# Patient Record
Sex: Female | Born: 1940 | Race: White | Hispanic: No | Marital: Married | Smoking: Former smoker
Health system: Southern US, Community
[De-identification: ages and names within clinical notes are randomized; demographics above are authoritative.]

## PROBLEM LIST (undated history)

## (undated) DIAGNOSIS — J449 Chronic obstructive pulmonary disease, unspecified: Secondary | ICD-10-CM

## (undated) DIAGNOSIS — Z7901 Long term (current) use of anticoagulants: Secondary | ICD-10-CM

## (undated) DIAGNOSIS — M1990 Unspecified osteoarthritis, unspecified site: Secondary | ICD-10-CM

## (undated) DIAGNOSIS — Z87442 Personal history of urinary calculi: Secondary | ICD-10-CM

## (undated) DIAGNOSIS — G4700 Insomnia, unspecified: Secondary | ICD-10-CM

## (undated) DIAGNOSIS — R0600 Dyspnea, unspecified: Secondary | ICD-10-CM

## (undated) DIAGNOSIS — R05 Cough: Secondary | ICD-10-CM

## (undated) DIAGNOSIS — I700 Atherosclerosis of aorta: Secondary | ICD-10-CM

## (undated) DIAGNOSIS — R053 Chronic cough: Secondary | ICD-10-CM

## (undated) DIAGNOSIS — J45909 Unspecified asthma, uncomplicated: Secondary | ICD-10-CM

## (undated) DIAGNOSIS — D649 Anemia, unspecified: Secondary | ICD-10-CM

## (undated) DIAGNOSIS — F32A Depression, unspecified: Secondary | ICD-10-CM

## (undated) DIAGNOSIS — I5189 Other ill-defined heart diseases: Secondary | ICD-10-CM

## (undated) DIAGNOSIS — T8859XA Other complications of anesthesia, initial encounter: Secondary | ICD-10-CM

## (undated) DIAGNOSIS — G4733 Obstructive sleep apnea (adult) (pediatric): Secondary | ICD-10-CM

## (undated) DIAGNOSIS — E119 Type 2 diabetes mellitus without complications: Secondary | ICD-10-CM

## (undated) DIAGNOSIS — I350 Nonrheumatic aortic (valve) stenosis: Secondary | ICD-10-CM

## (undated) DIAGNOSIS — I10 Essential (primary) hypertension: Secondary | ICD-10-CM

## (undated) DIAGNOSIS — Z8719 Personal history of other diseases of the digestive system: Secondary | ICD-10-CM

## (undated) DIAGNOSIS — M48061 Spinal stenosis, lumbar region without neurogenic claudication: Secondary | ICD-10-CM

## (undated) DIAGNOSIS — M810 Age-related osteoporosis without current pathological fracture: Secondary | ICD-10-CM

## (undated) DIAGNOSIS — G2581 Restless legs syndrome: Secondary | ICD-10-CM

## (undated) DIAGNOSIS — R011 Cardiac murmur, unspecified: Secondary | ICD-10-CM

## (undated) DIAGNOSIS — E7800 Pure hypercholesterolemia, unspecified: Secondary | ICD-10-CM

## (undated) DIAGNOSIS — N183 Chronic kidney disease, stage 3 unspecified: Secondary | ICD-10-CM

## (undated) DIAGNOSIS — G4730 Sleep apnea, unspecified: Secondary | ICD-10-CM

## (undated) DIAGNOSIS — I4891 Unspecified atrial fibrillation: Secondary | ICD-10-CM

## (undated) DIAGNOSIS — A419 Sepsis, unspecified organism: Secondary | ICD-10-CM

## (undated) DIAGNOSIS — K5792 Diverticulitis of intestine, part unspecified, without perforation or abscess without bleeding: Secondary | ICD-10-CM

## (undated) HISTORY — PX: CATARACT EXTRACTION W/ INTRAOCULAR LENS  IMPLANT, BILATERAL: SHX1307

## (undated) HISTORY — PX: BREAST SURGERY: SHX581

## (undated) HISTORY — PX: TOTAL SHOULDER REPLACEMENT: SUR1217

## (undated) HISTORY — PX: ABDOMINAL HYSTERECTOMY: SHX81

## (undated) HISTORY — PX: COLONOSCOPY: SHX174

## (undated) HISTORY — DX: Cardiac murmur, unspecified: R01.1

## (undated) HISTORY — DX: Unspecified atrial fibrillation: I48.91

## (undated) HISTORY — PX: WRIST SURGERY: SHX841

## (undated) HISTORY — PX: ELBOW SURGERY: SHX618

## (undated) HISTORY — DX: Diverticulitis of intestine, part unspecified, without perforation or abscess without bleeding: K57.92

## (undated) HISTORY — PX: CHOLECYSTECTOMY: SHX55

## (undated) HISTORY — PX: FRACTURE SURGERY: SHX138

## (undated) HISTORY — PX: KNEE SURGERY: SHX244

## (undated) HISTORY — PX: BREAST CYST EXCISION: SHX579

## (undated) HISTORY — PX: EYE SURGERY: SHX253

## (undated) HISTORY — PX: JOINT REPLACEMENT: SHX530

## (undated) HISTORY — PX: BREAST BIOPSY: SHX20

---

## 1976-05-25 HISTORY — PX: ABDOMINAL HYSTERECTOMY: SHX81

## 1976-05-25 HISTORY — PX: APPENDECTOMY: SHX54

## 1995-12-01 ENCOUNTER — Encounter: Payer: Self-pay | Admitting: Family Medicine

## 2012-05-25 HISTORY — PX: TOTAL SHOULDER REPLACEMENT: SUR1217

## 2014-04-05 DIAGNOSIS — S62109A Fracture of unspecified carpal bone, unspecified wrist, initial encounter for closed fracture: Secondary | ICD-10-CM | POA: Insufficient documentation

## 2014-04-06 DIAGNOSIS — E1169 Type 2 diabetes mellitus with other specified complication: Secondary | ICD-10-CM | POA: Insufficient documentation

## 2014-04-06 DIAGNOSIS — S82122A Displaced fracture of lateral condyle of left tibia, initial encounter for closed fracture: Secondary | ICD-10-CM | POA: Insufficient documentation

## 2014-04-06 DIAGNOSIS — I10 Essential (primary) hypertension: Secondary | ICD-10-CM | POA: Insufficient documentation

## 2014-04-06 DIAGNOSIS — J4520 Mild intermittent asthma, uncomplicated: Secondary | ICD-10-CM | POA: Insufficient documentation

## 2015-04-02 DIAGNOSIS — E782 Mixed hyperlipidemia: Secondary | ICD-10-CM | POA: Insufficient documentation

## 2015-04-02 DIAGNOSIS — G2581 Restless legs syndrome: Secondary | ICD-10-CM | POA: Insufficient documentation

## 2015-04-02 DIAGNOSIS — S22000A Wedge compression fracture of unspecified thoracic vertebra, initial encounter for closed fracture: Secondary | ICD-10-CM | POA: Insufficient documentation

## 2016-06-04 DIAGNOSIS — M8589 Other specified disorders of bone density and structure, multiple sites: Secondary | ICD-10-CM | POA: Insufficient documentation

## 2016-07-21 DIAGNOSIS — K5790 Diverticulosis of intestine, part unspecified, without perforation or abscess without bleeding: Secondary | ICD-10-CM | POA: Insufficient documentation

## 2016-07-24 DIAGNOSIS — G4733 Obstructive sleep apnea (adult) (pediatric): Secondary | ICD-10-CM | POA: Insufficient documentation

## 2016-11-17 DIAGNOSIS — K219 Gastro-esophageal reflux disease without esophagitis: Secondary | ICD-10-CM | POA: Insufficient documentation

## 2017-04-14 ENCOUNTER — Emergency Department (HOSPITAL_COMMUNITY): Payer: Medicare Other

## 2017-04-14 ENCOUNTER — Encounter (HOSPITAL_COMMUNITY): Payer: Self-pay | Admitting: Emergency Medicine

## 2017-04-14 ENCOUNTER — Emergency Department (HOSPITAL_COMMUNITY)
Admission: EM | Admit: 2017-04-14 | Discharge: 2017-04-14 | Disposition: A | Discharge status: Home / Self Care | Payer: Medicare Other | Attending: Emergency Medicine | Admitting: Emergency Medicine

## 2017-04-14 DIAGNOSIS — R059 Cough, unspecified: Secondary | ICD-10-CM

## 2017-04-14 DIAGNOSIS — Z794 Long term (current) use of insulin: Secondary | ICD-10-CM | POA: Insufficient documentation

## 2017-04-14 DIAGNOSIS — Z79899 Other long term (current) drug therapy: Secondary | ICD-10-CM | POA: Diagnosis not present

## 2017-04-14 DIAGNOSIS — I10 Essential (primary) hypertension: Secondary | ICD-10-CM | POA: Insufficient documentation

## 2017-04-14 DIAGNOSIS — Z87891 Personal history of nicotine dependence: Secondary | ICD-10-CM | POA: Diagnosis not present

## 2017-04-14 DIAGNOSIS — R05 Cough: Secondary | ICD-10-CM | POA: Diagnosis not present

## 2017-04-14 DIAGNOSIS — E119 Type 2 diabetes mellitus without complications: Secondary | ICD-10-CM | POA: Diagnosis not present

## 2017-04-14 HISTORY — DX: Cough: R05

## 2017-04-14 HISTORY — DX: Restless legs syndrome: G25.81

## 2017-04-14 HISTORY — DX: Sleep apnea, unspecified: G47.30

## 2017-04-14 HISTORY — DX: Pure hypercholesterolemia, unspecified: E78.00

## 2017-04-14 HISTORY — DX: Type 2 diabetes mellitus without complications: E11.9

## 2017-04-14 HISTORY — DX: Chronic cough: R05.3

## 2017-04-14 HISTORY — DX: Essential (primary) hypertension: I10

## 2017-04-14 MED ORDER — HYDROCOD POLST-CPM POLST ER 10-8 MG/5ML PO SUER: 5.0000 mL | Freq: Two times a day (BID) | ORAL | 0 refills | Status: DC | PRN

## 2017-04-14 NOTE — ED Triage Notes (Signed)
patient c/o chronic cough that got worse yesterday. States that cough gotten deeper and clear productive phlegm.

## 2017-04-14 NOTE — ED Provider Notes (Signed)
[facility] DEPT Provider Note   CSN: [id number] Arrival date & time: 04/14/17  1733     History   Chief Complaint Chief Complaint  Patient presents with  . Cough    HPI Natasha [name] is a 76 y.o. female.  HPI  76 year old female with a history of chronic cough presents with worsening cough.  She is traveling down to this area from [place] and the cough started worsening yesterday while in the car.  His cough has been ongoing for several years.  She has tried many different medicines.  She has no fever, shortness of breath, congestion, or headache/sore throat.  The congestion is occasionally productive with clear sputum which is chronic for her.  No leg swelling.  She has a history of "mild asthma" but does not use inhaler.  She is worried that she may be developing pneumonia.  Past Medical History:  Diagnosis Date  . Chronic cough   . Diabetes mellitus without complication ([place])   . High cholesterol   . Hypertension   . Restless leg   . Sleep apnea     There are no active problems to display for this patient.   Past Surgical History:  Procedure Laterality Date  . ABDOMINAL HYSTERECTOMY    . BREAST SURGERY    . ELBOW SURGERY    . FRACTURE SURGERY    . TOTAL SHOULDER REPLACEMENT      OB History    No data available       Home Medications    Prior to Admission medications   Medication Sig Start Date End Date Taking? Authorizing Provider  acetaminophen (TYLENOL) 500 MG tablet Take 1,500 mg by mouth every 6 (six) hours as needed for mild pain.   Yes [provider]  calcium carbonate (OS-CAL - DOSED IN MG OF ELEMENTAL CALCIUM) 1250 (500 Ca) MG tablet Take 1 tablet by mouth 2 (two) times daily with a meal.   Yes [provider]  Diclofenac Sodium CR 100 MG 24 hr tablet Take 100 mg by mouth daily. 04/01/17  Yes [provider]  HUMALOG KWIKPEN 100 UNIT/ML KiwkPen Inject 0-45 Units into the skin 2 (two) times  daily. Pt has sliding scale- injects dependent on how many carbs she eats 04/07/17  Yes [provider]  JANUMET 50-1000 MG tablet Take 1 tablet by mouth 2 (two) times daily. 02/08/17  Yes [provider]  losartan (COZAAR) 50 MG tablet Take 50 mg by mouth at bedtime. 02/15/17  Yes [provider]  Misc Natural Products (GLUCOSAMINE CHOND DOUBLE STR PO) Take 1 tablet by mouth 2 (two) times daily.   Yes [provider]  montelukast (SINGULAIR) 10 MG tablet Take 10 mg by mouth at bedtime. 02/15/17  Yes [provider]  Multiple Vitamin (MULTIVITAMIN WITH MINERALS) TABS tablet Take 1 tablet by mouth daily.   Yes [provider]  pramipexole (MIRAPEX) 1 MG tablet Take 1 mg by mouth at bedtime. 02/08/17  Yes [provider]  ranitidine (ZANTAC) 150 MG tablet Take 150 mg by mouth at bedtime. 02/08/17  Yes [provider]  rosuvastatin (CRESTOR) 20 MG tablet Take 20 mg by mouth at bedtime. 03/26/17  Yes [provider]  sertraline (ZOLOFT) 50 MG tablet Take 50 mg by mouth at bedtime. 02/15/17  Yes [provider]  TRESIBA FLEXTOUCH 100 UNIT/ML SOPN FlexTouch Pen Inject 50 Units into the skin at bedtime. 03/26/17  Yes [provider]  VITAMIN E PO  Take 1 tablet by mouth 2 (two) times daily.   Yes [provider]  chlorpheniramine-HYDROcodone (TUSSIONEX PENNKINETIC ER) 10-8 MG/5ML SUER Take 5 mLs by mouth every 12 (twelve) hours as needed for cough. 04/14/17   Sherwood Gambler, MD    Family History No family history on file.  Social History Social History   Tobacco Use  . Smoking status: Former Research scientist (life sciences)  . Smokeless tobacco: Never Used  Substance Use Topics  . Alcohol use: No    Frequency: Never  . Drug use: Not on file     Allergies   Lantus [insulin glargine]   Review of Systems Review of Systems  Constitutional: Negative for fever.  HENT: Negative for congestion, rhinorrhea and sore  throat.   Respiratory: Positive for cough. Negative for shortness of breath.   Cardiovascular: Negative for chest pain and leg swelling.  Gastrointestinal: Negative for abdominal pain.  All other systems reviewed and are negative.    Physical Exam Updated Vital Signs BP 140/80 (BP Location: Right Arm)   Pulse 88   Temp 98.1 F (36.7 C) (Oral)   Resp 18   SpO2 96%   Physical Exam  Constitutional: She is oriented to person, place, and time. She appears well-developed and well-nourished. No distress.  HENT:  Head: Normocephalic and atraumatic.  Right Ear: External ear normal.  Left Ear: External ear normal.  Nose: Nose normal.  Mouth/Throat: Oropharynx is clear and moist.  Eyes: Right eye exhibits no discharge. Left eye exhibits no discharge.  Cardiovascular: Normal rate, regular rhythm and normal heart sounds.  Pulmonary/Chest: Effort normal and breath sounds normal. No respiratory distress. She has no wheezes. She has no rales.  Abdominal: Soft. There is no tenderness.  Musculoskeletal: She exhibits no edema.  Neurological: She is alert and oriented to person, place, and time.  Skin: Skin is warm and dry. She is not diaphoretic.  Nursing note and vitals reviewed.    ED Treatments / Results  Labs (all labs ordered are listed, but only abnormal results are displayed) Labs Reviewed - No data to display  EKG  EKG Interpretation None       Radiology Dg Chest 2 View  Result Date: 04/14/2017 CLINICAL DATA:  Productive cough for 2 days EXAM: CHEST  2 VIEW COMPARISON:  None. FINDINGS: Atelectatic type opacities at the left more than right base. The lungs are hyperinflated. There is no edema, consolidation, effusion, or pneumothorax. Reverse glenohumeral arthroplasty on the left. Remote midthoracic compression fracture with moderate height loss. Probable cholecystectomy clips. IMPRESSION: Hyperinflation and mild atelectasis or scarring at the bases. Question COPD. Negative  for focal pneumonia. Electronically Signed   By: Monte Fantasia M.D.   On: 04/14/2017 18:49    Procedures Procedures (including critical care time)  Medications Ordered in ED Medications - No data to display   Initial Impression / Assessment and Plan / ED Course  I have reviewed the triage vital signs and the nursing notes.  Pertinent labs & imaging results that were available during my care of the patient were reviewed by me and considered in my medical decision making (see chart for details).     Patient's lungs are clear.  She has no increased work of breathing or hypoxia.  Besides moderate hypertension her vital signs are unremarkable.  Chest x-ray shows no pneumonia.  Otherwise her symptoms are benign.  Cough suppressant and follow-up with PCP.  Discussed return precautions.  Final Clinical Impressions(s) / ED Diagnoses   Final diagnoses:  Cough    ED Discharge Orders        Ordered    chlorpheniramine-HYDROcodone (TUSSIONEX PENNKINETIC ER) 10-8 MG/5ML SUER  Every 12 hours PRN     04/14/17 2250       Sherwood Gambler, MD 04/14/17 2352

## 2017-08-13 DIAGNOSIS — Z9884 Bariatric surgery status: Secondary | ICD-10-CM | POA: Insufficient documentation

## 2017-11-24 DIAGNOSIS — G5791 Unspecified mononeuropathy of right lower limb: Secondary | ICD-10-CM | POA: Insufficient documentation

## 2017-12-22 HISTORY — PX: GASTRIC BYPASS: SHX52

## 2017-12-24 DIAGNOSIS — I480 Paroxysmal atrial fibrillation: Secondary | ICD-10-CM | POA: Insufficient documentation

## 2017-12-24 DIAGNOSIS — D509 Iron deficiency anemia, unspecified: Secondary | ICD-10-CM | POA: Insufficient documentation

## 2017-12-24 DIAGNOSIS — D649 Anemia, unspecified: Secondary | ICD-10-CM | POA: Insufficient documentation

## 2018-05-24 DIAGNOSIS — I350 Nonrheumatic aortic (valve) stenosis: Secondary | ICD-10-CM | POA: Insufficient documentation

## 2018-05-27 DIAGNOSIS — N183 Chronic kidney disease, stage 3 unspecified: Secondary | ICD-10-CM | POA: Insufficient documentation

## 2018-06-17 DIAGNOSIS — G4700 Insomnia, unspecified: Secondary | ICD-10-CM | POA: Insufficient documentation

## 2018-08-10 DIAGNOSIS — E663 Overweight: Secondary | ICD-10-CM | POA: Insufficient documentation

## 2018-09-23 DIAGNOSIS — M48061 Spinal stenosis, lumbar region without neurogenic claudication: Secondary | ICD-10-CM | POA: Insufficient documentation

## 2018-09-23 DIAGNOSIS — Z87891 Personal history of nicotine dependence: Secondary | ICD-10-CM | POA: Insufficient documentation

## 2018-09-23 DIAGNOSIS — R29898 Other symptoms and signs involving the musculoskeletal system: Secondary | ICD-10-CM | POA: Insufficient documentation

## 2018-11-02 DIAGNOSIS — M48061 Spinal stenosis, lumbar region without neurogenic claudication: Secondary | ICD-10-CM | POA: Insufficient documentation

## 2018-11-02 DIAGNOSIS — M461 Sacroiliitis, not elsewhere classified: Secondary | ICD-10-CM | POA: Insufficient documentation

## 2018-11-02 DIAGNOSIS — Z9181 History of falling: Secondary | ICD-10-CM | POA: Insufficient documentation

## 2019-01-12 DIAGNOSIS — G894 Chronic pain syndrome: Secondary | ICD-10-CM | POA: Insufficient documentation

## 2019-01-12 DIAGNOSIS — M792 Neuralgia and neuritis, unspecified: Secondary | ICD-10-CM | POA: Insufficient documentation

## 2019-11-06 ENCOUNTER — Encounter: Payer: Self-pay | Admitting: Family Medicine

## 2019-11-06 ENCOUNTER — Ambulatory Visit (INDEPENDENT_AMBULATORY_CARE_PROVIDER_SITE_OTHER)
Admission: RE | Admit: 2019-11-06 | Discharge: 2019-11-06 | Disposition: A | Discharge status: Home / Self Care | Payer: Medicare Other | Source: Ambulatory Visit | Attending: Family Medicine | Admitting: Family Medicine

## 2019-11-06 ENCOUNTER — Other Ambulatory Visit: Payer: Self-pay

## 2019-11-06 ENCOUNTER — Ambulatory Visit (INDEPENDENT_AMBULATORY_CARE_PROVIDER_SITE_OTHER): Payer: Medicare Other | Admitting: Family Medicine

## 2019-11-06 VITALS — BP 132/70 | HR 88 | Temp 97.3°F | Ht 63.5 in | Wt 158.8 lb

## 2019-11-06 DIAGNOSIS — I480 Paroxysmal atrial fibrillation: Secondary | ICD-10-CM

## 2019-11-06 DIAGNOSIS — M25562 Pain in left knee: Secondary | ICD-10-CM

## 2019-11-06 DIAGNOSIS — Z794 Long term (current) use of insulin: Secondary | ICD-10-CM

## 2019-11-06 DIAGNOSIS — E118 Type 2 diabetes mellitus with unspecified complications: Secondary | ICD-10-CM | POA: Diagnosis not present

## 2019-11-06 DIAGNOSIS — R4583 Excessive crying of child, adolescent or adult: Secondary | ICD-10-CM

## 2019-11-06 DIAGNOSIS — E2839 Other primary ovarian failure: Secondary | ICD-10-CM

## 2019-11-06 DIAGNOSIS — Z7689 Persons encountering health services in other specified circumstances: Secondary | ICD-10-CM | POA: Diagnosis not present

## 2019-11-06 DIAGNOSIS — M8580 Other specified disorders of bone density and structure, unspecified site: Secondary | ICD-10-CM

## 2019-11-06 DIAGNOSIS — G2581 Restless legs syndrome: Secondary | ICD-10-CM

## 2019-11-06 MED ORDER — SERTRALINE HCL 50 MG PO TABS: 50.0000 mg | ORAL_TABLET | Freq: Every day | ORAL | 3 refills | Status: DC

## 2019-11-06 MED ORDER — PRAMIPEXOLE DIHYDROCHLORIDE 1 MG PO TABS
1.0000 mg | ORAL_TABLET | Freq: Every day | ORAL | 3 refills | Status: DC
Start: 2019-11-06 — End: 2020-10-14

## 2019-11-06 NOTE — Patient Instructions (Signed)
Please call and schedule an appointment for screening mammogram. A referral is not needed.  [phone]

## 2019-11-06 NOTE — Progress Notes (Signed)
Subjective:    Patient ID: Natasha [name], female    DOB: 12/30/1940, 79 y.o.   MRN: [id number]  HPI Chief Complaint  Patient presents with  . New Patient (Initial Visit)    Estab Care - Left knee pain (old injury, broken knee) - pt notes weight bearing at times is severely painful   This is a 79 yo female who presents today to establish care. She is accompanied by husband who is also establishing care. They moved here last week from [place] to live near DIL, grandson.  Enjoys Programmer, multimedia, fine art shows, gardening, painting.    Last CPE- within last year, Medicare AWV 07/03/2019. Labs 10/18/2019 Mammo- 7/20 Pap- aged out Colonoscopy- 06/16/2012 Tdap- unknown, no documentation found in EMR Flu- annual Eye- due, last approximately 1 year ago Dental- regular Exercise- active around house.   DM type 2- tests infrequently, last a1c 8.0. Taking Janumet 50-1000, Tesiba 10 units nightly. Weight stable. Has not been eating well with move.   Zoloft- was on before for crying easily, has been off for a couple of months, would [name] to go back on. Denies feeling depressed.   Left knee pain- off and on for several days, history of broken tibula with plate. Pain with certain ways she steps, 30 + times a day, sharp, shooting.   Chronic back pain- spasms across thoracic back, pain low back with degenerative disc disease. Resistant to pain medications, muscle relaxers. Has required pain medication several times a month. Gets some relief with sleep. Has seen pain management in past.   OSA with cpap- followed by pulmonary  Review of Systems As above, denies chest pain, SOB, abdominal pain, diarrhea/ constipation, dysuria, hematuria, urinary frequency.     Objective:   Physical Exam Vitals reviewed.  Constitutional:      General: She is not in acute distress.    Appearance: Normal appearance. She is normal weight. She is not ill-appearing, toxic-appearing or diaphoretic.  HENT:     Head:  Normocephalic and atraumatic.  Eyes:     Conjunctiva/sclera: Conjunctivae normal.  Cardiovascular:     Rate and Rhythm: Normal rate and regular rhythm.     Heart sounds: Normal heart sounds.  Pulmonary:     Effort: Pulmonary effort is normal.     Breath sounds: Normal breath sounds.  Musculoskeletal:     Cervical back: Normal range of motion.     Right lower leg: No edema.     Left lower leg: No edema.     Comments: Normal gait without assistive devices. Left knee with full ROM, no edema, no erythema. Nontender to palpation.   Skin:    General: Skin is warm and dry.  Neurological:     Mental Status: She is alert and oriented to person, place, and time.  Psychiatric:        Mood and Affect: Mood normal.        Behavior: Behavior normal.        Thought Content: Thought content normal.        Judgment: Judgment normal.       BP 132/70 (BP Location: Right Arm, Patient Position: Sitting, Cuff Size: Normal)   Pulse 88   Temp (!) 97.3 F (36.3 C) (Temporal)   Ht 5' 3.5" (1.613 m)   Wt 158 lb 12.8 oz (72 kg)   SpO2 97%   BMI 27.69 kg/m  Wt Readings from Last 3 Encounters:  11/06/19 158 lb 12.8 oz (72 kg)  Assessment & Plan:  1. Encounter to establish care - Reviewed records available in EMR - follow up in 3 months - referral per patient request- optho, card, pul  2. Controlled type 2 diabetes mellitus with complication, with long-term current use of insulin ([place]) - will check hgba1c at 3 month follow up - Ambulatory referral to Endocrinology - Ambulatory referral to Ophthalmology  3. Paroxysmal atrial fibrillation (HCC) - continue Eliquis, CBC done last month - Ambulatory referral to Cardiology  4. Osteopenia, unspecified location - due for bone density, she will schedule with mammo - DG Bone Density; Future  5. Estrogen deficiency - DG Bone Density; Future  6. Acute pain of left knee - DG Knee Complete 4 Views Left; Future  7. Excessive crying -  sertraline (ZOLOFT) 50 MG tablet; Take 1 tablet (50 mg total) by mouth at bedtime.  Dispense: 90 tablet; Refill: 3  8. Restless leg syndrome - pramipexole (MIRAPEX) 1 MG tablet; Take 1 tablet (1 mg total) by mouth at bedtime.  Dispense: 90 tablet; Refill: 3  This visit occurred during the SARS-CoV-2 public health emergency.  Safety protocols were in place, including screening questions prior to the visit, additional usage of staff PPE, and extensive cleaning of exam room while observing appropriate contact time as indicated for disinfecting solutions.    Clarene Reamer, FNP-BC  [facility], [facility]  11/07/2019 9:17 AM

## 2019-11-07 ENCOUNTER — Encounter: Payer: Self-pay | Admitting: Family Medicine

## 2019-11-09 ENCOUNTER — Encounter: Payer: Self-pay | Admitting: Family Medicine

## 2019-11-20 ENCOUNTER — Other Ambulatory Visit: Payer: Self-pay

## 2019-11-20 ENCOUNTER — Ambulatory Visit (INDEPENDENT_AMBULATORY_CARE_PROVIDER_SITE_OTHER): Payer: Medicare Other | Admitting: Cardiology

## 2019-11-20 ENCOUNTER — Encounter: Payer: Self-pay | Admitting: Cardiology

## 2019-11-20 VITALS — BP 118/78 | HR 74 | Ht 64.0 in | Wt 152.5 lb

## 2019-11-20 DIAGNOSIS — I350 Nonrheumatic aortic (valve) stenosis: Secondary | ICD-10-CM | POA: Diagnosis not present

## 2019-11-20 DIAGNOSIS — I480 Paroxysmal atrial fibrillation: Secondary | ICD-10-CM

## 2019-11-20 NOTE — Progress Notes (Signed)
Cardiology Office Note:    Date:  11/20/2019   ID:  [name], DOB April 19, 1941, MRN [id number]  PCP:  Elby Beck, FNP  [facility] Cardiologist:  Kate Sable, MD  [place] Electrophysiologist:  None   Referring MD: Elby Beck, FNP   Chief Complaint  Patient presents with  . other    Hx AFIB c/o sob. Meds reviewed verbally with pt.    History of Present Illness:    Natasha [name] is a 79 y.o. female with a hx of hypertension, hyperlipidemia, atrial fibrillation who presents due to a history of atrial fibrillation.  Patient recently moved to the area from [place] to be closer to family.  Has a history of paroxysmal atrial fibrillation, diagnosed in 2019 while in the hospital after gastric bypass surgery.  Patient takes Eliquis.  She states having occasional episodes of palpitations since.  She feels well, denies chest pain, but endorses shortness of breath which she states having for years.  Outside echocardiogram following 06/2017 showed normal ejection fraction, EF 65 to 70%, moderate LVH, mild AS.  Cardiac monitor on 12/2017 showed less than 1% atrial fibrillation all occurance.  Past Medical History:  Diagnosis Date  . A-fib ([place])   . Chronic cough   . Diabetes mellitus without complication ([place])   . Diverticulitis   . High cholesterol   . Hypertension   . Murmur   . Restless leg   . Sleep apnea     Past Surgical History:  Procedure Laterality Date  . ABDOMINAL HYSTERECTOMY  1978  . APPENDECTOMY  1978  . BREAST SURGERY    . CHOLECYSTECTOMY    . ELBOW SURGERY    . FRACTURE SURGERY    . 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 KNEE SURGERY    . TOTAL SHOULDER REPLACEMENT    . WRIST SURGERY      Current Medications: Current Meds  Medication Sig  . acetaminophen (TYLENOL) 500 MG tablet Take 1,500 mg by mouth every 6 (six) hours as needed for mild pain.  Marland Kitchen apixaban (ELIQUIS) 5 MG TABS tablet Take 5 mg by mouth 2 (two) times daily.   . Biotin  1000 MCG CHEW biotin 1,000 mcg chewable tablet  Take by oral route.  . calcium carbonate (OS-CAL - DOSED IN MG OF ELEMENTAL CALCIUM) 1250 (500 Ca) MG tablet Take 1 tablet by mouth 2 (two) times daily with a meal.  . HYDROmorphone (DILAUDID) 2 MG tablet Take by mouth as needed.   Marland Kitchen JANUMET 50-1000 MG tablet Take 1 tablet by mouth 2 (two) times daily.  . Multiple Vitamin (MULTIVITAMIN WITH MINERALS) TABS tablet Take 1 tablet by mouth daily.  Marland Kitchen oxyCODONE (OXY IR/ROXICODONE) 5 MG immediate release tablet SMARTSIG:1.5 Tablet(s) By Mouth Twice Daily PRN  . pramipexole (MIRAPEX) 1 MG tablet Take 1 tablet (1 mg total) by mouth at bedtime.  . rosuvastatin (CRESTOR) 20 MG tablet Take 20 mg by mouth at bedtime.  . sertraline (ZOLOFT) 50 MG tablet Take 1 tablet (50 mg total) by mouth at bedtime.  . TRESIBA FLEXTOUCH 100 UNIT/ML SOPN FlexTouch Pen Inject 10 Units into the skin at bedtime.   Marland Kitchen VITAMIN E PO Take 1 tablet by mouth 2 (two) times daily.     Allergies:   Atorvastatin, Lantus [insulin glargine], and Lisinopril   Social History   Socioeconomic History  . Marital status: Married    Spouse name: Not on file  . Number of children: Not on file  . Years  of education: Not on file  . Highest education level: Not on file  Occupational History  . Not on file  Tobacco Use  . Smoking status: Former Smoker    Packs/day: 2.00    Years: 12.00    Pack years: 24.00    Types: Cigarettes    Quit date: 05/26/1975    Years since quitting: 44.5  . Smokeless tobacco: Never Used  Vaping Use  . Vaping Use: Never used  Substance and Sexual Activity  . Alcohol use: No  . Drug use: Never  . Sexual activity: Not on file  Other Topics Concern  . Not on file  Social History Narrative   Married to [employer] to [facility] 6/21 to be near family   Enjoys making jewelery, going to art shows, gardening and painting   Social Determinants of Health   Financial Resource Strain:   . Difficulty of Paying Living  Expenses:   Food Insecurity:   . Worried About Charity fundraiser in the Last Year:   . Arboriculturist in the Last Year:   Transportation Needs:   . Film/video editor (Medical):   Marland Kitchen Lack of Transportation (Non-Medical):   Physical Activity:   . Days of Exercise per Week:   . Minutes of Exercise per Session:   Stress:   . Feeling of Stress :   Social Connections:   . Frequency of Communication with Friends and Family:   . Frequency of Social Gatherings with Friends and Family:   . Attends Religious Services:   . Active Member of Clubs or Organizations:   . Attends Archivist Meetings:   Marland Kitchen Marital Status:      Family History: The patient's family history includes AAA (abdominal aortic aneurysm) in her mother; Diabetes in her father; Other in her father and mother.  ROS:   Please see the history of present illness.     All other systems reviewed and are negative.  EKGs/Labs/Other Studies Reviewed:    The following studies were reviewed today:   EKG:  EKG is  ordered today.  The ekg ordered today demonstrates sinus rhythm, occasional PVCs  Recent Labs: No results found for requested labs within last 8760 hours.  Recent Lipid Panel No results found for: CHOL, TRIG, HDL, CHOLHDL, VLDL, LDLCALC, LDLDIRECT  Physical Exam:    VS:  BP 118/78 (BP Location: Right Arm, Patient Position: Sitting, Cuff Size: Normal)   Pulse 74   Ht $R'5\' 4"'KO$  (1.626 m)   Wt 152 lb 8 oz (69.2 kg)   SpO2 98%   BMI 26.18 kg/m     Wt Readings from Last 3 Encounters:  11/20/19 152 lb 8 oz (69.2 kg)  11/06/19 158 lb 12.8 oz (72 kg)     GEN:  Well nourished, well developed in no acute distress HEENT: Normal NECK: No JVD; No carotid bruits LYMPHATICS: No lymphadenopathy CARDIAC: RRR, systolic murmur, rubs, gallops RESPIRATORY:  Clear to auscultation without rales, wheezing or rhonchi  ABDOMEN: Soft, non-tender, non-distended MUSCULOSKELETAL:  No edema; No deformity  SKIN: Warm and  dry NEUROLOGIC:  Alert and oriented x 3 PSYCHIATRIC:  Normal affect   ASSESSMENT:    1. Paroxysmal atrial fibrillation (HCC)   2. Aortic valve stenosis, etiology of cardiac valve disease unspecified    PLAN:    In order of problems listed above:  1. Patient with history of paroxysmal atrial fibrillation.  Currently in sinus rhythm.  CHA2DS2-VASc score of 4 (  age, 37,, gender).  Agree with Eliquis.  Get echocardiogram to evaluate for gross structural abnormalities. 2. History of mild aortic valve stenosis.  Echocardiogram as above to evaluate for any progression of valvular disease.  Follow-up after echocardiogram  This note was generated in part or whole with voice recognition software. Voice recognition is usually quite accurate but there are transcription errors that can and very often do occur. I apologize for any typographical errors that were not detected and corrected.  Medication Adjustments/Labs and Tests Ordered: Current medicines are reviewed at length with the patient today.  Concerns regarding medicines are outlined above.  Orders Placed This Encounter  Procedures  . EKG 12-Lead  . ECHOCARDIOGRAM COMPLETE   No orders of the defined types were placed in this encounter.   Patient Instructions  Medication Instructions:  No changes *If you need a refill on your cardiac medications before your next appointment, please call your pharmacy*   Lab Work: None Ordered If you have labs (blood work) drawn today and your tests are completely normal, you will receive your results only by: Marland Kitchen MyChart Message (if you have MyChart) OR . A paper copy in the mail If you have any lab test that is abnormal or we need to change your treatment, we will call you to review the results.   Testing/Procedures:  Your physician has requested that you have an echocardiogram. Echocardiography is a painless test that uses sound waves to create images of your heart. It provides your doctor with  information about the size and shape of your heart and how well your heart's chambers and valves are working. This procedure takes approximately one hour. There are no restrictions for this procedure.    Follow-Up: At [facility], you and your health needs are our priority.  As part of our continuing mission to provide you with exceptional heart care, we have created designated Provider Care Teams.  These Care Teams include your primary Cardiologist (physician) and Advanced Practice Providers (APPs -  Physician Assistants and Nurse Practitioners) who all work together to provide you with the care you need, when you need it.  We recommend signing up for the patient portal called "MyChart".  Sign up information is provided on this After Visit Summary.  MyChart is used to connect with patients for Virtual Visits (Telemedicine).  Patients are able to view lab/test results, encounter notes, upcoming appointments, etc.  Non-urgent messages can be sent to your provider as well.   To learn more about what you can do with MyChart, go to [email or web address].    Your next appointment:   After Echo   The format for your next appointment:   In Person  Provider:   Kate Sable, MD   Other Instructions   Echocardiogram An echocardiogram is a procedure that uses painless sound waves (ultrasound) to produce an image of the heart. Images from an echocardiogram can provide important information about:  Signs of coronary artery disease (CAD).  Aneurysm detection. An aneurysm is a weak or damaged part of an artery wall that bulges out from the normal force of blood pumping through the body.  Heart size and shape. Changes in the size or shape of the heart can be associated with certain conditions, including heart failure, aneurysm, and CAD.  Heart muscle function.  Heart valve function.  Signs of a past heart attack.  Fluid buildup around the heart.  Thickening of the heart muscle.  A  tumor or infectious growth around the heart valves.  Tell a health care provider about:  Any allergies you have.  All medicines you are taking, including vitamins, herbs, eye drops, creams, and over-the-counter medicines.  Any blood disorders you have.  Any surgeries you have had.  Any medical conditions you have.  Whether you are pregnant or may be pregnant. What are the risks? Generally, this is a safe procedure. However, problems may occur, including:  Allergic reaction to dye (contrast) that may be used during the procedure. What happens before the procedure? No specific preparation is needed. You may eat and drink normally. What happens during the procedure?   An IV tube may be inserted into one of your veins.  You may receive contrast through this tube. A contrast is an injection that improves the quality of the pictures from your heart.  A gel will be applied to your chest.  A wand-like tool (transducer) will be moved over your chest. The gel will help to transmit the sound waves from the transducer.  The sound waves will harmlessly bounce off of your heart to allow the heart images to be captured in real-time motion. The images will be recorded on a computer. The procedure may vary among health care providers and hospitals. What happens after the procedure?  You may return to your normal, everyday life, including diet, activities, and medicines, unless your health care provider tells you not to do that. Summary  An echocardiogram is a procedure that uses painless sound waves (ultrasound) to produce an image of the heart.  Images from an echocardiogram can provide important information about the size and shape of your heart, heart muscle function, heart valve function, and fluid buildup around your heart.  You do not need to do anything to prepare before this procedure. You may eat and drink normally.  After the echocardiogram is completed, you may return to your  normal, everyday life, unless your health care provider tells you not to do that. This information is not intended to replace advice given to you by your health care provider. Make sure you discuss any questions you have with your health care provider. Document Revised: 09/01/2018 Document Reviewed: 06/13/2016 Elsevier Patient Education  2020 [place], Kate Sable, MD  11/20/2019 1:14 PM    [place]

## 2019-11-20 NOTE — Patient Instructions (Signed)
Medication Instructions:  No changes *If you need a refill on your cardiac medications before your next appointment, please call your pharmacy*   Lab Work: None Ordered If you have labs (blood work) drawn today and your tests are completely normal, you will receive your results only by: Marland Kitchen MyChart Message (if you have MyChart) OR . A paper copy in the mail If you have any lab test that is abnormal or we need to change your treatment, we will call you to review the results.   Testing/Procedures:  Your physician has requested that you have an echocardiogram. Echocardiography is a painless test that uses sound waves to create images of your heart. It provides your doctor with information about the size and shape of your heart and how well your heart's chambers and valves are working. This procedure takes approximately one hour. There are no restrictions for this procedure.    Follow-Up: At [facility], you and your health needs are our priority.  As part of our continuing mission to provide you with exceptional heart care, we have created designated Provider Care Teams.  These Care Teams include your primary Cardiologist (physician) and Advanced Practice Providers (APPs -  Physician Assistants and Nurse Practitioners) who all work together to provide you with the care you need, when you need it.  We recommend signing up for the patient portal called "MyChart".  Sign up information is provided on this After Visit Summary.  MyChart is used to connect with patients for Virtual Visits (Telemedicine).  Patients are able to view lab/test results, encounter notes, upcoming appointments, etc.  Non-urgent messages can be sent to your provider as well.   To learn more about what you can do with MyChart, go to [email or web address].    Your next appointment:   After Echo   The format for your next appointment:   In Person  Provider:   Debbe Odea, MD   Other  Instructions   Echocardiogram An echocardiogram is a procedure that uses painless sound waves (ultrasound) to produce an image of the heart. Images from an echocardiogram can provide important information about:  Signs of coronary artery disease (CAD).  Aneurysm detection. An aneurysm is a weak or damaged part of an artery wall that bulges out from the normal force of blood pumping through the body.  Heart size and shape. Changes in the size or shape of the heart can be associated with certain conditions, including heart failure, aneurysm, and CAD.  Heart muscle function.  Heart valve function.  Signs of a past heart attack.  Fluid buildup around the heart.  Thickening of the heart muscle.  A tumor or infectious growth around the heart valves. Tell a health care provider about:  Any allergies you have.  All medicines you are taking, including vitamins, herbs, eye drops, creams, and over-the-counter medicines.  Any blood disorders you have.  Any surgeries you have had.  Any medical conditions you have.  Whether you are pregnant or may be pregnant. What are the risks? Generally, this is a safe procedure. However, problems may occur, including:  Allergic reaction to dye (contrast) that may be used during the procedure. What happens before the procedure? No specific preparation is needed. You may eat and drink normally. What happens during the procedure?   An IV tube may be inserted into one of your veins.  You may receive contrast through this tube. A contrast is an injection that improves the quality of the pictures from your heart.  A gel will be applied to your chest.  A wand-like tool (transducer) will be moved over your chest. The gel will help to transmit the sound waves from the transducer.  The sound waves will harmlessly bounce off of your heart to allow the heart images to be captured in real-time motion. The images will be recorded on a computer. The  procedure may vary among health care providers and hospitals. What happens after the procedure?  You may return to your normal, everyday life, including diet, activities, and medicines, unless your health care provider tells you not to do that. Summary  An echocardiogram is a procedure that uses painless sound waves (ultrasound) to produce an image of the heart.  Images from an echocardiogram can provide important information about the size and shape of your heart, heart muscle function, heart valve function, and fluid buildup around your heart.  You do not need to do anything to prepare before this procedure. You may eat and drink normally.  After the echocardiogram is completed, you may return to your normal, everyday life, unless your health care provider tells you not to do that. This information is not intended to replace advice given to you by your health care provider. Make sure you discuss any questions you have with your health care provider. Document Revised: 09/01/2018 Document Reviewed: 06/13/2016 Elsevier Patient Education  [place].

## 2019-12-21 ENCOUNTER — Other Ambulatory Visit: Payer: Self-pay

## 2019-12-21 ENCOUNTER — Ambulatory Visit (INDEPENDENT_AMBULATORY_CARE_PROVIDER_SITE_OTHER): Payer: Medicare Other

## 2019-12-21 DIAGNOSIS — I480 Paroxysmal atrial fibrillation: Secondary | ICD-10-CM | POA: Diagnosis not present

## 2019-12-21 DIAGNOSIS — I350 Nonrheumatic aortic (valve) stenosis: Secondary | ICD-10-CM

## 2019-12-22 ENCOUNTER — Ambulatory Visit: Payer: Medicare Other | Admitting: Cardiology

## 2019-12-22 ENCOUNTER — Ambulatory Visit (INDEPENDENT_AMBULATORY_CARE_PROVIDER_SITE_OTHER): Payer: Medicare Other | Admitting: Cardiology

## 2019-12-22 ENCOUNTER — Encounter: Payer: Self-pay | Admitting: Cardiology

## 2019-12-22 VITALS — BP 150/90 | HR 77 | Ht 64.0 in | Wt 152.2 lb

## 2019-12-22 DIAGNOSIS — E7800 Pure hypercholesterolemia, unspecified: Secondary | ICD-10-CM | POA: Diagnosis not present

## 2019-12-22 DIAGNOSIS — I480 Paroxysmal atrial fibrillation: Secondary | ICD-10-CM | POA: Diagnosis not present

## 2019-12-22 DIAGNOSIS — R600 Localized edema: Secondary | ICD-10-CM

## 2019-12-22 DIAGNOSIS — I350 Nonrheumatic aortic (valve) stenosis: Secondary | ICD-10-CM | POA: Diagnosis not present

## 2019-12-22 LAB — ECHOCARDIOGRAM COMPLETE
AR max vel: 1.49 cm2
AV Area VTI: 1.48 cm2
AV Area mean vel: 1.42 cm2
AV Mean grad: 9 mmHg
AV Peak grad: 16.5 mmHg
Ao pk vel: 2.03 m/s
Area-P 1/2: 3.3 cm2
Calc EF: 57.5 %
S' Lateral: 3 cm
Single Plane A2C EF: 56.1 %
Single Plane A4C EF: 58 %

## 2019-12-22 MED ORDER — FUROSEMIDE 40 MG PO TABS: 40.0000 mg | ORAL_TABLET | ORAL | 3 refills | Status: DC | PRN

## 2019-12-22 NOTE — Progress Notes (Signed)
Cardiology Office Note:    Date:  12/22/2019   ID:  Natasha [name], DOB 1940/08/30, MRN [id number]  PCP:  Elby Beck, FNP  [facility] Cardiologist:  Kate Sable, MD  [place] Electrophysiologist:  None   Referring MD: Elby Beck, FNP   Chief Complaint  Patient presents with  . office visit    F/U after echo; Meds verbally reviewed with patient.    History of Present Illness:    Natasha [name] is a 79 y.o. female with a hx of hypertension, mild aortic stenosis, hyperlipidemia, paroxysmal atrial fibrillation on Eliquis who presents for follow-up.  Patient was in sinus rhythm last clinic visit.  Echocardiogram was ordered to evaluate any structural abnormalities and also monitor any progression in valvular disease.  He otherwise feels well.  Tolerating medications and blood thinner okay.  She states having lower extremity edema after being on her feet for too long.  She denies any edema in the morning when she wakes up.  Prior notes  patient recently moved to the area from [place] to be closer to family.  Has a history of paroxysmal atrial fibrillation, diagnosed in 2019 while in the hospital after gastric bypass surgery.  Patient takes Eliquis.  She states having occasional episodes of palpitations since.    Outside echocardiogram  06/2017 showed normal ejection fraction, EF 65 to 70%, moderate LVH, mild AS.  Cardiac monitor on 12/2017 showed less than 1% atrial fibrillation all occurance.  Past Medical History:  Diagnosis Date  . A-fib ([place])   . Chronic cough   . Diabetes mellitus without complication ([place])   . Diverticulitis   . High cholesterol   . Hypertension   . Murmur   . Restless leg   . Sleep apnea     Past Surgical History:  Procedure Laterality Date  . ABDOMINAL HYSTERECTOMY  1978  . APPENDECTOMY  1978  . BREAST SURGERY    . CHOLECYSTECTOMY    . ELBOW SURGERY    . FRACTURE SURGERY    . 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 KNEE SURGERY     . TOTAL SHOULDER REPLACEMENT    . WRIST SURGERY      Current Medications: Current Meds  Medication Sig  . acetaminophen (TYLENOL) 500 MG tablet Take 1,500 mg by mouth every 6 (six) hours as needed for mild pain.  Marland Kitchen apixaban (ELIQUIS) 5 MG TABS tablet Take 5 mg by mouth 2 (two) times daily.   . Biotin 1000 MCG CHEW biotin 1,000 mcg chewable tablet  Take by oral route.  . calcium carbonate (OS-CAL - DOSED IN MG OF ELEMENTAL CALCIUM) 1250 (500 Ca) MG tablet Take 1 tablet by mouth 2 (two) times daily with a meal.  . HYDROmorphone (DILAUDID) 2 MG tablet Take by mouth as needed.   Marland Kitchen JANUMET 50-1000 MG tablet Take 1 tablet by mouth 2 (two) times daily.  . Multiple Vitamin (MULTIVITAMIN WITH MINERALS) TABS tablet Take 1 tablet by mouth daily.  Marland Kitchen oxyCODONE (OXY IR/ROXICODONE) 5 MG immediate release tablet SMARTSIG:1.5 Tablet(s) By Mouth Twice Daily PRN  . pramipexole (MIRAPEX) 1 MG tablet Take 1 tablet (1 mg total) by mouth at bedtime.  . rosuvastatin (CRESTOR) 20 MG tablet Take 20 mg by mouth at bedtime.  . sertraline (ZOLOFT) 50 MG tablet Take 1 tablet (50 mg total) by mouth at bedtime.  . TRESIBA FLEXTOUCH 100 UNIT/ML SOPN FlexTouch Pen Inject 10 Units into the skin at bedtime.   Marland Kitchen VITAMIN E PO Take  1 tablet by mouth 2 (two) times daily.     Allergies:   Atorvastatin, Lantus [insulin glargine], and Lisinopril   Social History   Socioeconomic History  . Marital status: Married    Spouse name: Not on file  . Number of children: Not on file  . Years of education: Not on file  . Highest education level: Not on file  Occupational History  . Not on file  Tobacco Use  . Smoking status: Former Smoker    Packs/day: 2.00    Years: 12.00    Pack years: 24.00    Types: Cigarettes    Quit date: 05/26/1975    Years since quitting: 44.6  . Smokeless tobacco: Never Used  Vaping Use  . Vaping Use: Never used  Substance and Sexual Activity  . Alcohol use: No  . Drug use: Never  . Sexual  activity: Not on file  Other Topics Concern  . Not on file  Social History Narrative   Married to [employer] to [facility] 6/21 to be near family   Enjoys making jewelery, going to art shows, gardening and painting   Social Determinants of Health   Financial Resource Strain:   . Difficulty of Paying Living Expenses:   Food Insecurity:   . Worried About Charity fundraiser in the Last Year:   . Arboriculturist in the Last Year:   Transportation Needs:   . Film/video editor (Medical):   Marland Kitchen Lack of Transportation (Non-Medical):   Physical Activity:   . Days of Exercise per Week:   . Minutes of Exercise per Session:   Stress:   . Feeling of Stress :   Social Connections:   . Frequency of Communication with Friends and Family:   . Frequency of Social Gatherings with Friends and Family:   . Attends Religious Services:   . Active Member of Clubs or Organizations:   . Attends Archivist Meetings:   Marland Kitchen Marital Status:      Family History: The patient's family history includes AAA (abdominal aortic aneurysm) in her mother; Diabetes in her father; Other in her father and mother.  ROS:   Please see the history of present illness.     All other systems reviewed and are negative.  EKGs/Labs/Other Studies Reviewed:    The following studies were reviewed today:   EKG:  EKG not ordered today.   Recent Labs: No results found for requested labs within last 8760 hours.  Recent Lipid Panel No results found for: CHOL, TRIG, HDL, CHOLHDL, VLDL, LDLCALC, LDLDIRECT  Physical Exam:    VS:  BP (!) 150/90 (BP Location: Left Arm, Patient Position: Sitting, Cuff Size: Normal)   Pulse 77   Ht _0  (1.626 m)   Wt 152 lb 4 oz (69.1 kg)   SpO2 96%   BMI 26.13 kg/m     Wt Readings from Last 3 Encounters:  12/22/19 152 lb 4 oz (69.1 kg)  11/20/19 152 lb 8 oz (69.2 kg)  11/06/19 158 lb 12.8 oz (72 kg)     GEN:  Well nourished, well developed in no acute distress HEENT:  Normal NECK: No JVD; No carotid bruits LYMPHATICS: No lymphadenopathy CARDIAC: RRR, systolic murmur, rubs, gallops RESPIRATORY:  Clear to auscultation without rales, wheezing or rhonchi  ABDOMEN: Soft, non-tender, non-distended MUSCULOSKELETAL:  1+ edema; No deformity  SKIN: Warm and dry NEUROLOGIC:  Alert and oriented x 3 PSYCHIATRIC:  Normal affect   ASSESSMENT:  1. Paroxysmal atrial fibrillation ([place])   2. Bilateral leg edema   3. Aortic valve stenosis, etiology of cardiac valve disease unspecified   4. Pure hypercholesterolemia    PLAN:    In order of problems listed above:  1. Patient with history of paroxysmal atrial fibrillation.  Currently in sinus rhythm.  CHA2DS2-VASc score of 4 (age, htn, gender).  Continue Eliquis.  Echo reviewed by myself, shows normal systolic function, grade 2 diastolic function, EF 54%. 2. Edema could be combination of diastolic dysfunction versus venous insufficiency.  Conservative measures with leg raising while patient in seated position encouraged.  Due to diastolic dysfunction, will order Lasix 40 mg daily as needed for edema. 3. History of mild aortic valve stenosis.  Echocardiogram showed stable aortic valve disease, sclerosed to mildly stenosed.  Recommend monitoring per [facility] AHA guidelines every 3 to 5 years. 4. Hx of hld. On statin, continue  Follow-up in 6 months.  Total encounter time 40 minutes  Greater than 50% was spent in counseling and coordination of care with the patient   This note was generated in part or whole with voice recognition software. Voice recognition is usually quite accurate but there are transcription errors that can and very often do occur. I apologize for any typographical errors that were not detected and corrected.  Medication Adjustments/Labs and Tests Ordered: Current medicines are reviewed at length with the patient today.  Concerns regarding medicines are outlined above.  No orders of the defined types  were placed in this encounter.  Meds ordered this encounter  Medications  . furosemide (LASIX) 40 MG tablet    Sig: Take 1 tablet (40 mg total) by mouth as needed.    Dispense:  30 tablet    Refill:  3    Patient Instructions  Medication Instructions:   Your physician has recommended you make the following change in your medication:   START taking furosemide (LASIX): Take 1 tablet (40 mg total) by mouth as needed for lower extremity swelling.  *If you need a refill on your cardiac medications before your next appointment, please call your pharmacy*   Lab Work: None Ordered If you have labs (blood work) drawn today and your tests are completely normal, you will receive your results only by: Marland Kitchen MyChart Message (if you have MyChart) OR . A paper copy in the mail If you have any lab test that is abnormal or we need to change your treatment, we will call you to review the results.   Testing/Procedures: None Ordered   Follow-Up: At [facility], you and your health needs are our priority.  As part of our continuing mission to provide you with exceptional heart care, we have created designated Provider Care Teams.  These Care Teams include your primary Cardiologist (physician) and Advanced Practice Providers (APPs -  Physician Assistants and Nurse Practitioners) who all work together to provide you with the care you need, when you need it.  We recommend signing up for the patient portal called "MyChart".  Sign up information is provided on this After Visit Summary.  MyChart is used to connect with patients for Virtual Visits (Telemedicine).  Patients are able to view lab/test results, encounter notes, upcoming appointments, etc.  Non-urgent messages can be sent to your provider as well.   To learn more about what you can do with MyChart, go to [email or web address].    Your next appointment:   6 month(s)  The format for your next appointment:   In Person  Provider:   Kate Sable, MD   Other Instructions  Furosemide Oral Tablets What is this medicine? FUROSEMIDE (fyoor [place] se mide) is a diuretic. It helps you make more urine and to lose salt and excess water from your body. It treats swelling from heart, kidney, or liver disease. It also treats high blood pressure. This medicine may be used for other purposes; ask your health care provider or pharmacist if you have questions. COMMON BRAND NAME(S): Active-Medicated Specimen Kit, Delone, Diuscreen, Lasix, RX Specimen Collection Kit, Specimen Collection Kit, URINX Medicated Specimen Collection What should I tell my health care provider before I take this medicine? They need to know if you have any of these conditions:  abnormal blood electrolytes  diarrhea or vomiting  gout  heart disease  kidney disease, small amounts of urine, or difficulty passing urine  liver disease  thyroid disease  an unusual or allergic reaction to furosemide, sulfa drugs, other medicines, foods, dyes, or preservatives  pregnant or trying to get pregnant  breast-feeding How should I use this medicine? Take this drug by mouth. Take it as directed on the prescription label at the same time every day. You can take it with or without food. If it upsets your stomach, take it with food. Keep taking it unless your health care provider tells you to stop. Talk to your health care provider about the use of this drug in children. Special care may be needed. Overdosage: If you think you have taken too much of this medicine contact a poison control center or emergency room at once. NOTE: This medicine is only for you. Do not share this medicine with others. What if I miss a dose? If you miss a dose, take it as soon as you can. If it is almost time for your next dose, take only that dose. Do not take double or extra doses. What may interact with this medicine?  aspirin and aspirin-[name] medicines  certain antibiotics  chloral  hydrate  cisplatin  cyclosporine  digoxin  diuretics  laxatives  lithium  medicines for blood pressure  medicines that relax muscles for surgery  methotrexate  NSAIDs, medicines for pain and inflammation [name] ibuprofen, naproxen, or indomethacin  phenytoin  steroid medicines [name] prednisone or cortisone  sucralfate  thyroid hormones This list may not describe all possible interactions. Give your health care provider a list of all the medicines, herbs, non-prescription drugs, or dietary supplements you use. Also tell them if you smoke, drink alcohol, or use illegal drugs. Some items may interact with your medicine. What should I watch for while using this medicine? Visit your doctor or health care provider for regular checks on your progress. Check your blood pressure regularly. Ask your doctor or health care provider what your blood pressure should be, and when you should contact him or her. If you are a diabetic, check your blood sugar as directed. This medicine may cause serious skin reactions. They can happen weeks to months after starting the medicine. Contact your health care provider right away if you notice fevers or flu-[name] symptoms with a rash. The rash may be red or purple and then turn into blisters or peeling of the skin. Or, you might notice a red rash with swelling of the face, lips or lymph nodes in your neck or under your arms. You may need to be on a special diet while taking this medicine. Check with your doctor. Also, ask how many glasses of fluid you need to drink  a day. You must not get dehydrated. You may get drowsy or dizzy. Do not drive, use machinery, or do anything that needs mental alertness until you know how this drug affects you. Do not stand or sit up quickly, especially if you are an older patient. This reduces the risk of dizzy or fainting spells. Alcohol can make you more drowsy and dizzy. Avoid alcoholic drinks. This medicine can make you more  sensitive to the sun. Keep out of the sun. If you cannot avoid being in the sun, wear protective clothing and use sunscreen. Do not use sun lamps or tanning beds/booths. What side effects may I notice from receiving this medicine? Side effects that you should report to your doctor or health care professional as soon as possible:  blood in urine or stools  dry mouth  fever or chills  hearing loss or ringing in the ears  irregular heartbeat  muscle pain or weakness, cramps  rash, fever, and swollen lymph nodes  redness, blistering, peeling or loosening of the skin, including inside the mouth  skin rash  stomach upset, pain, or nausea  tingling or numbness in the hands or feet  unusually weak or tired  vomiting or diarrhea  yellowing of the eyes or skin Side effects that usually do not require medical attention (report to your doctor or health care professional if they continue or are bothersome):  headache  loss of appetite  unusual bleeding or bruising This list may not describe all possible side effects. Call your doctor for medical advice about side effects. You may report side effects to FDA at 1-800-FDA-1088. Where should I keep my medicine? Keep out of the reach of children and pets. Store at room temperature between 20 and 25 degrees C (68 and 77 degrees F). Protect from light and moisture. Keep the container tightly closed. Throw away any unused drug after the expiration date. NOTE: This sheet is a summary. It may not cover all possible information. If you have questions about this medicine, talk to your doctor, pharmacist, or health care provider.  2020 Elsevier/Gold Standard (2018-12-27 18:01:32)     Signed, Kate Sable, MD  12/22/2019 5:07 PM    [facility]

## 2019-12-22 NOTE — Patient Instructions (Signed)
Medication Instructions:   Your physician has recommended you make the following change in your medication:   START taking furosemide (LASIX): Take 1 tablet (40 mg total) by mouth as needed for lower extremity swelling.  *If you need a refill on your cardiac medications before your next appointment, please call your pharmacy*   Lab Work: None Ordered If you have labs (blood work) drawn today and your tests are completely normal, you will receive your results only by: Marland Kitchen MyChart Message (if you have MyChart) OR . A paper copy in the mail If you have any lab test that is abnormal or we need to change your treatment, we will call you to review the results.   Testing/Procedures: None Ordered   Follow-Up: At [facility], you and your health needs are our priority.  As part of our continuing mission to provide you with exceptional heart care, we have created designated Provider Care Teams.  These Care Teams include your primary Cardiologist (physician) and Advanced Practice Providers (APPs -  Physician Assistants and Nurse Practitioners) who all work together to provide you with the care you need, when you need it.  We recommend signing up for the patient portal called "MyChart".  Sign up information is provided on this After Visit Summary.  MyChart is used to connect with patients for Virtual Visits (Telemedicine).  Patients are able to view lab/test results, encounter notes, upcoming appointments, etc.  Non-urgent messages can be sent to your provider as well.   To learn more about what you can do with MyChart, go to [email or web address].    Your next appointment:   6 month(s)  The format for your next appointment:   In Person  Provider:   Kate Sable, MD   Other Instructions  Furosemide Oral Tablets What is this medicine? FUROSEMIDE (fyoor [place] se mide) is a diuretic. It helps you make more urine and to lose salt and excess water from your body. It treats swelling from  heart, kidney, or liver disease. It also treats high blood pressure. This medicine may be used for other purposes; ask your health care provider or pharmacist if you have questions. COMMON BRAND NAME(S): Active-Medicated Specimen Kit, Delone, Diuscreen, Lasix, RX Specimen Collection Kit, Specimen Collection Kit, URINX Medicated Specimen Collection What should I tell my health care provider before I take this medicine? They need to know if you have any of these conditions:  abnormal blood electrolytes  diarrhea or vomiting  gout  heart disease  kidney disease, small amounts of urine, or difficulty passing urine  liver disease  thyroid disease  an unusual or allergic reaction to furosemide, sulfa drugs, other medicines, foods, dyes, or preservatives  pregnant or trying to get pregnant  breast-feeding How should I use this medicine? Take this drug by mouth. Take it as directed on the prescription label at the same time every day. You can take it with or without food. If it upsets your stomach, take it with food. Keep taking it unless your health care provider tells you to stop. Talk to your health care provider about the use of this drug in children. Special care may be needed. Overdosage: If you think you have taken too much of this medicine contact a poison control center or emergency room at once. NOTE: This medicine is only for you. Do not share this medicine with others. What if I miss a dose? If you miss a dose, take it as soon as you can. If it is almost time  for your next dose, take only that dose. Do not take double or extra doses. What may interact with this medicine?  aspirin and aspirin-like medicines  certain antibiotics  chloral hydrate  cisplatin  cyclosporine  digoxin  diuretics  laxatives  lithium  medicines for blood pressure  medicines that relax muscles for surgery  methotrexate  NSAIDs, medicines for pain and inflammation like ibuprofen,  naproxen, or indomethacin  phenytoin  steroid medicines like prednisone or cortisone  sucralfate  thyroid hormones This list may not describe all possible interactions. Give your health care provider a list of all the medicines, herbs, non-prescription drugs, or dietary supplements you use. Also tell them if you smoke, drink alcohol, or use illegal drugs. Some items may interact with your medicine. What should I watch for while using this medicine? Visit your doctor or health care provider for regular checks on your progress. Check your blood pressure regularly. Ask your doctor or health care provider what your blood pressure should be, and when you should contact him or her. If you are a diabetic, check your blood sugar as directed. This medicine may cause serious skin reactions. They can happen weeks to months after starting the medicine. Contact your health care provider right away if you notice fevers or flu-like symptoms with a rash. The rash may be red or purple and then turn into blisters or peeling of the skin. Or, you might notice a red rash with swelling of the face, lips or lymph nodes in your neck or under your arms. You may need to be on a special diet while taking this medicine. Check with your doctor. Also, ask how many glasses of fluid you need to drink a day. You must not get dehydrated. You may get drowsy or dizzy. Do not drive, use machinery, or do anything that needs mental alertness until you know how this drug affects you. Do not stand or sit up quickly, especially if you are an older patient. This reduces the risk of dizzy or fainting spells. Alcohol can make you more drowsy and dizzy. Avoid alcoholic drinks. This medicine can make you more sensitive to the sun. Keep out of the sun. If you cannot avoid being in the sun, wear protective clothing and use sunscreen. Do not use sun lamps or tanning beds/booths. What side effects may I notice from receiving this medicine? Side  effects that you should report to your doctor or health care professional as soon as possible:  blood in urine or stools  dry mouth  fever or chills  hearing loss or ringing in the ears  irregular heartbeat  muscle pain or weakness, cramps  rash, fever, and swollen lymph nodes  redness, blistering, peeling or loosening of the skin, including inside the mouth  skin rash  stomach upset, pain, or nausea  tingling or numbness in the hands or feet  unusually weak or tired  vomiting or diarrhea  yellowing of the eyes or skin Side effects that usually do not require medical attention (report to your doctor or health care professional if they continue or are bothersome):  headache  loss of appetite  unusual bleeding or bruising This list may not describe all possible side effects. Call your doctor for medical advice about side effects. You may report side effects to FDA at 1-800-FDA-1088. Where should I keep my medicine? Keep out of the reach of children and pets. Store at room temperature between 20 and 25 degrees C (68 and 77 degrees F). Protect  from light and moisture. Keep the container tightly closed. Throw away any unused drug after the expiration date. NOTE: This sheet is a summary. It may not cover all possible information. If you have questions about this medicine, talk to your doctor, pharmacist, or health care provider.  2020 Elsevier/Gold Standard (2018-12-27 18:01:32)

## 2019-12-25 ENCOUNTER — Other Ambulatory Visit: Payer: Self-pay

## 2019-12-25 ENCOUNTER — Encounter: Payer: Self-pay | Admitting: Family Medicine

## 2019-12-25 ENCOUNTER — Ambulatory Visit (INDEPENDENT_AMBULATORY_CARE_PROVIDER_SITE_OTHER): Payer: Medicare Other | Admitting: Family Medicine

## 2019-12-25 VITALS — BP 106/58 | HR 85 | Temp 97.7°F | Ht 64.0 in | Wt 146.5 lb

## 2019-12-25 DIAGNOSIS — G4733 Obstructive sleep apnea (adult) (pediatric): Secondary | ICD-10-CM | POA: Diagnosis not present

## 2019-12-25 DIAGNOSIS — M792 Neuralgia and neuritis, unspecified: Secondary | ICD-10-CM

## 2019-12-25 DIAGNOSIS — B351 Tinea unguium: Secondary | ICD-10-CM | POA: Diagnosis not present

## 2019-12-25 DIAGNOSIS — Z9989 Dependence on other enabling machines and devices: Secondary | ICD-10-CM

## 2019-12-25 MED ORDER — CICLOPIROX 8 % EX SOLN: Freq: Every day | CUTANEOUS | 0 refills | Status: DC

## 2019-12-25 NOTE — Patient Instructions (Signed)
Good to see you today  I will get in touch once I get your CPAP records from [facility]  I have sent in an antifungal nail laquer. Please keep an eye on it and if not better in 8-12 weeks, let me know.    Fungal Nail Infection A fungal nail infection is a common infection of the toenails or fingernails. This condition affects toenails more often than fingernails. It often affects the great, or big, toes. More than one nail may be infected. The condition can be passed from person to person (is contagious). What are the causes? This condition is caused by a fungus. Several types of fungi can cause the infection. These fungi are common in moist and warm areas. If your hands or feet come into contact with the fungus, it may get into a crack in your fingernail or toenail and cause the infection. What increases the risk? The following factors may make you more likely to develop this condition:  Being female.  Being of older age.  Living with someone who has the fungus.  Walking barefoot in areas where the fungus thrives, such as showers or locker rooms.  Wearing shoes and socks that cause your feet to sweat.  Having a nail injury or a recent nail surgery.  Having certain medical conditions, such as: ? Athlete's foot. ? Diabetes. ? Psoriasis. ? Poor circulation. ? A weak body defense system (immune system). What are the signs or symptoms? Symptoms of this condition include:  A pale spot on the nail.  Thickening of the nail.  A nail that becomes yellow or brown.  A brittle or ragged nail edge.  A crumbling nail.  A nail that has lifted away from the nail bed. How is this diagnosed? This condition is diagnosed with a physical exam. Your health care provider may take a scraping or clipping from your nail to test for the fungus. How is this treated? Treatment is not needed for mild infections. If you have significant nail changes, treatment may include:  Antifungal medicines taken by  mouth (orally). You may need to take the medicine for several weeks or several months, and you may not see the results for a long time. These medicines can cause side effects. Ask your health care provider what problems to watch for.  Antifungal nail polish or nail cream. These may be used along with oral antifungal medicines.  Laser treatment of the nail.  Surgery to remove the nail. This may be needed for the most severe infections. It can take a long time, usually up to a year, for the infection to go away. The infection may also come back. Follow these instructions at home: Medicines  Take or apply over-the-counter and prescription medicines only as told by your health care provider.  Ask your health care provider about using over-the-counter mentholated ointment on your nails. Nail care  Trim your nails often.  Wash and dry your hands and feet every day.  Keep your feet dry: ? Wear absorbent socks, and change your socks frequently. ? Wear shoes that allow air to circulate, such as sandals or canvas tennis shoes. Throw out old shoes.  Do not use artificial nails.  If you go to a nail salon, make sure you choose one that uses clean instruments.  Use antifungal foot powder on your feet and in your shoes. General instructions  Do not share personal items, such as towels or nail clippers.  Do not walk barefoot in shower rooms or locker  rooms.  Wear rubber gloves if you are working with your hands in wet areas.  Keep all follow-up visits as told by your health care provider. This is important. Contact a health care provider if: Your infection is not getting better or it is getting worse after several months. Summary  A fungal nail infection is a common infection of the toenails or fingernails.  Treatment is not needed for mild infections. If you have significant nail changes, treatment may include taking medicine orally and applying medicine to your nails.  It can take a  long time, usually up to a year, for the infection to go away. The infection may also come back.  Take or apply over-the-counter and prescription medicines only as told by your health care provider.  Follow instructions for taking care of your nails to help prevent infection from coming back or spreading. This information is not intended to replace advice given to you by your health care provider. Make sure you discuss any questions you have with your health care provider. Document Revised: 09/01/2018 Document Reviewed: 10/15/2017 Elsevier Patient Education  [place].

## 2019-12-25 NOTE — Progress Notes (Signed)
° °  Subjective:    Patient ID: Natasha [name], female    DOB: 1940-10-30, 79 y.o.   MRN: [id number]  HPI Chief Complaint  Patient presents with   Referral    cpap machine   Has one of the week old CPAP machines.  She has been in touch with the manufacturer and is awaiting news regarding replacement.  She is not sure how old her machine is but does not think it is greater than 35 years old.  She feels that she is very reliant on it and does not want to go without using it.  She typically wears acrylic nails and removed her nails last week and noticed some discoloration and thickening of her right thumbnail.  She reports that she has filed the nail down with improvement in appearance.  She is not having pain.  No other nails seem to be affected.  She requests handicap placard for difficulty walking due to neuropathic pain.   Review of Systems Per HPI    Objective:   Physical Exam Vitals reviewed.  Constitutional:      General: She is not in acute distress.    Appearance: Normal appearance. She is normal weight. She is not ill-appearing, toxic-appearing or diaphoretic.  HENT:     Head: Normocephalic and atraumatic.  Eyes:     Conjunctiva/sclera: Conjunctivae normal.  Cardiovascular:     Rate and Rhythm: Normal rate.  Pulmonary:     Effort: Pulmonary effort is normal.  Skin:    General: Skin is warm and dry.     Comments: Right third of distal thumbnail with discoloration.  All of her nails appear thin.  Neurological:     Mental Status: She is alert and oriented to person, place, and time.  Psychiatric:        Mood and Affect: Mood normal.        Behavior: Behavior normal.        Thought Content: Thought content normal.        Judgment: Judgment normal.     BP (!) 106/58    Pulse 85    Temp 97.7 F (36.5 C) (Temporal)    Ht $R'5\' 4"'lA$  (1.626 m)    Wt 146 lb 8 oz (66.5 kg)    SpO2 97%    BMI 25.15 kg/m  Wt Readings from Last 3 Encounters:  12/25/19 146 lb 8 oz (66.5 kg)    12/22/19 152 lb 4 oz (69.1 kg)  11/20/19 152 lb 8 oz (69.2 kg)       Assessment & Plan:  1. Onychomycosis - Provided written and verbal information regarding diagnosis and treatment. - ciclopirox (PENLAC) 8 % solution; Apply topically at bedtime. Apply over nail and surrounding skin. Apply daily over previous coat. After seven (7) days, may remove with alcohol and continue cycle.  Dispense: 6.6 mL; Refill: 0  2. OSA on CPAP -We will request records from prior provider/suppliers to determine HF machine.  Additional steps following receiving information.  3. Neuropathic pain -Stable on current medications.  Provided form for handicap placard.  This visit occurred during the SARS-CoV-2 public health emergency.  Safety protocols were in place, including screening questions prior to the visit, additional usage of staff PPE, and extensive cleaning of exam room while observing appropriate contact time as indicated for disinfecting solutions.    Clarene Reamer, FNP-BC  [facility]  12/25/2019 11:45 AM

## 2020-01-05 ENCOUNTER — Other Ambulatory Visit: Payer: Self-pay | Admitting: Family Medicine

## 2020-01-08 ENCOUNTER — Other Ambulatory Visit: Payer: Self-pay | Admitting: Family Medicine

## 2020-01-08 NOTE — Telephone Encounter (Signed)
Pt called needing refill on  eliquis $Remove'5mg'xOdXKjW$  2 x daily [facility] [place]  90 day supply  Pt has 3-4 days left

## 2020-01-11 MED ORDER — APIXABAN 5 MG PO TABS: 5.0000 mg | ORAL_TABLET | Freq: Two times a day (BID) | ORAL | 2 refills | Status: DC

## 2020-01-11 MED ORDER — APIXABAN 5 MG PO TABS: 5.0000 mg | ORAL_TABLET | Freq: Two times a day (BID) | ORAL | 0 refills | Status: DC

## 2020-01-11 NOTE — Telephone Encounter (Signed)
Refill sent as requested. 

## 2020-01-21 ENCOUNTER — Emergency Department
Admission: EM | Admit: 2020-01-21 | Discharge: 2020-01-21 | Disposition: A | Discharge status: Home / Self Care | Payer: Medicare Other | Attending: Emergency Medicine | Admitting: Emergency Medicine

## 2020-01-21 ENCOUNTER — Emergency Department: Payer: Medicare Other

## 2020-01-21 ENCOUNTER — Other Ambulatory Visit: Payer: Self-pay

## 2020-01-21 ENCOUNTER — Encounter: Payer: Self-pay | Admitting: Emergency Medicine

## 2020-01-21 DIAGNOSIS — S39012A Strain of muscle, fascia and tendon of lower back, initial encounter: Secondary | ICD-10-CM | POA: Insufficient documentation

## 2020-01-21 DIAGNOSIS — X500XXA Overexertion from strenuous movement or load, initial encounter: Secondary | ICD-10-CM | POA: Insufficient documentation

## 2020-01-21 DIAGNOSIS — E1122 Type 2 diabetes mellitus with diabetic chronic kidney disease: Secondary | ICD-10-CM | POA: Insufficient documentation

## 2020-01-21 DIAGNOSIS — Z79899 Other long term (current) drug therapy: Secondary | ICD-10-CM | POA: Diagnosis not present

## 2020-01-21 DIAGNOSIS — I129 Hypertensive chronic kidney disease with stage 1 through stage 4 chronic kidney disease, or unspecified chronic kidney disease: Secondary | ICD-10-CM | POA: Diagnosis not present

## 2020-01-21 DIAGNOSIS — Y9289 Other specified places as the place of occurrence of the external cause: Secondary | ICD-10-CM | POA: Insufficient documentation

## 2020-01-21 DIAGNOSIS — Y93E5 Activity, floor mopping and cleaning: Secondary | ICD-10-CM | POA: Diagnosis not present

## 2020-01-21 DIAGNOSIS — Z87891 Personal history of nicotine dependence: Secondary | ICD-10-CM | POA: Diagnosis not present

## 2020-01-21 DIAGNOSIS — S3992XA Unspecified injury of lower back, initial encounter: Secondary | ICD-10-CM | POA: Diagnosis present

## 2020-01-21 DIAGNOSIS — N183 Chronic kidney disease, stage 3 unspecified: Secondary | ICD-10-CM | POA: Insufficient documentation

## 2020-01-21 DIAGNOSIS — Y999 Unspecified external cause status: Secondary | ICD-10-CM | POA: Insufficient documentation

## 2020-01-21 MED ORDER — DIAZEPAM 2 MG PO TABS: 2.0000 mg | ORAL_TABLET | Freq: Three times a day (TID) | ORAL | 0 refills | Status: DC | PRN

## 2020-01-21 MED ORDER — LIDOCAINE 5 % EX PTCH
1.0000 | MEDICATED_PATCH | CUTANEOUS | Status: DC
  Filled 2020-01-21: qty 1

## 2020-01-21 MED ORDER — DIAZEPAM 2 MG PO TABS
2.0000 mg | ORAL_TABLET | Freq: Once | ORAL | Status: AC
  Administered 2020-01-21: 2 mg via ORAL
  Filled 2020-01-21: qty 1

## 2020-01-21 MED ORDER — LIDOCAINE 5 % EX PTCH: 1.0000 | MEDICATED_PATCH | Freq: Two times a day (BID) | CUTANEOUS | 0 refills | Status: DC

## 2020-01-21 MED ORDER — OXYCODONE HCL 5 MG PO TABS: 5.0000 mg | ORAL_TABLET | Freq: Four times a day (QID) | ORAL | 0 refills | Status: DC | PRN

## 2020-01-21 MED ORDER — OXYCODONE HCL 5 MG PO TABS
5.0000 mg | ORAL_TABLET | Freq: Once | ORAL | Status: AC
  Administered 2020-01-21: 5 mg via ORAL
  Filled 2020-01-21: qty 1

## 2020-01-21 NOTE — ED Triage Notes (Signed)
Patient with complaint of lower back pain that started Friday after bending over. Patient states that the pain is not improving.

## 2020-01-21 NOTE — Discharge Instructions (Addendum)
Follow-up with your primary care provider if any continued problems or concerns.  You may also use ice or heat to your back as needed for discomfort.  Take medication only as directed.  Be aware that the diazepam and oxycodone can cause drowsiness and increase your risk for falling.  The Lidoderm patch can be applied to your back and does not cause drowsiness.  This also is for pain relief.  Your primary care provider may refer you to a pain clinic should you continue to have back pain to assess any continued medications long-term.

## 2020-01-21 NOTE — ED Notes (Signed)
Pt c/o back pain after bending over, pt states she has not been responding to oxycodone and is asking for RX for dilaudid at discharge.

## 2020-01-21 NOTE — ED Provider Notes (Signed)
[facility] Emergency Department Provider Note  ____________________________________________   First MD Initiated Contact with Patient 01/21/20 1117     (approximate)  I have reviewed the triage vital signs and the nursing notes.   HISTORY  Chief Complaint Back Pain   HPI Natasha [name] is a 79 y.o. female presents to the ED with complaint of low back pain.  Patient states that she has been doing a lot of activities around the house which has made her back hurt.  Patient denies any direct trauma to her back.  She states that she has been cleaning and painting in the house.  Prior to this she did not have any back pain.  She denies any radiation into her lower extremities, saddle anesthesias or incontinence of bowel or bladder.  Patient took oxycodone last night and at 5 AM this morning with minimal relief.  Patient states that it feels like "spasms".  Patient reports that she has a high tolerance to pain medication and that muscle relaxants do not work for her.  She rates her pain as 6 out of 10.      Past Medical History:  Diagnosis Date  . A-fib ([place])   . Chronic cough   . Diabetes mellitus without complication ([place])   . Diverticulitis   . High cholesterol   . Hypertension   . Murmur   . Restless leg   . Sleep apnea     Patient Active Problem List   Diagnosis Date Noted  . Chronic pain syndrome 01/12/2019  . Neuropathic pain 01/12/2019  . At high risk for falls 11/02/2018  . Neuroforaminal stenosis of lumbar spine 11/02/2018  . Sacroiliitis ([place]) 11/02/2018  . Former smoker 09/23/2018  . Spinal stenosis of lumbar region without neurogenic claudication 09/23/2018  . Weakness of both hands 09/23/2018  . Overweight (BMI 25.0-29.9) 08/10/2018  . Insomnia 06/17/2018  . 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 Nonrheumatic aortic valve stenosis 05/24/2018  . Normocytic anemia 12/24/2017  . Paroxysmal  atrial fibrillation ([place]) 12/24/2017  . 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/24/2017  . OSA (obstructive sleep apnea) 07/24/2016  . Diverticulosis 07/21/2016  . Osteopenia of multiple sites 06/04/2016  . Mood disorder of depressed type 04/29/2016  . Closed compression fracture of thoracic vertebra ([place]) 04/02/2015  . Mixed hyperlipidemia 04/02/2015  . RLS (restless legs syndrome) 04/02/2015  . Closed fracture of lateral portion of left tibial plateau 04/06/2014  . Essential hypertension 04/06/2014  . Mild intermittent asthma without complication 85/06/7739  . Type 2 diabetes mellitus without complication ([place]) 28/78/6767  . Wrist fracture 04/05/2014    Past Surgical History:  Procedure Laterality Date  . ABDOMINAL HYSTERECTOMY  1978  . APPENDECTOMY  1978  . BREAST SURGERY    . CHOLECYSTECTOMY    . ELBOW SURGERY    . FRACTURE SURGERY    . 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 KNEE SURGERY    . TOTAL SHOULDER REPLACEMENT    . WRIST SURGERY      Prior to Admission medications   Medication Sig Start Date End Date Taking? Authorizing Provider  acetaminophen (TYLENOL) 500 MG tablet Take 1,500 mg by mouth every 6 (six) hours as needed for mild pain.    [provider]  apixaban (ELIQUIS) 5 MG TABS tablet Take 1 tablet (5 mg total) by mouth 2 (two) times daily. 01/11/20   Elby Beck, FNP  Biotin 1000 MCG CHEW biotin 1,000 mcg  chewable tablet  Take by oral route.    [provider]  calcium carbonate (OS-CAL - DOSED IN MG OF ELEMENTAL CALCIUM) 1250 (500 Ca) MG tablet Take 1 tablet by mouth 2 (two) times daily with a meal.    [provider]  ciclopirox (PENLAC) 8 % solution Apply topically at bedtime. Apply over nail and surrounding skin. Apply daily over previous coat. After seven (7) days, may remove with alcohol and continue cycle. 12/25/19   Elby Beck, FNP  diazepam (VALIUM) 2 MG tablet Take 1 tablet (2 mg total) by mouth every 8 (eight) hours as  needed for anxiety. 01/21/20 01/20/21  Johnn Hai, PA-C  furosemide (LASIX) 40 MG tablet Take 1 tablet (40 mg total) by mouth as needed. 12/22/19 03/21/20  Kate Sable, MD  HYDROmorphone (DILAUDID) 2 MG tablet Take by mouth as needed.     [provider]  JANUMET 50-1000 MG tablet Take 1 tablet by mouth 2 (two) times daily. 02/08/17   [provider]  lidocaine (LIDODERM) 5 % Place 1 patch onto the skin every 12 (twelve) hours. Remove & Discard patch within 12 hours or as directed by MD 01/21/20 01/20/21  Johnn Hai, PA-C  Multiple Vitamin (MULTIVITAMIN WITH MINERALS) TABS tablet Take 1 tablet by mouth daily.    [provider]  oxyCODONE (OXY IR/ROXICODONE) 5 MG immediate release tablet SMARTSIG:1.5 Tablet(s) By Mouth Twice Daily PRN 09/08/19   [provider]  oxyCODONE (ROXICODONE) 5 MG immediate release tablet Take 1 tablet (5 mg total) by mouth every 6 (six) hours as needed for severe pain. 01/21/20   Johnn Hai, PA-C  pramipexole (MIRAPEX) 1 MG tablet Take 1 tablet (1 mg total) by mouth at bedtime. 11/06/19   Elby Beck, FNP  rosuvastatin (CRESTOR) 20 MG tablet Take 20 mg by mouth at bedtime. 03/26/17   [provider]  sertraline (ZOLOFT) 50 MG tablet Take 1 tablet (50 mg total) by mouth at bedtime. 11/06/19   Elby Beck, FNP  TRESIBA FLEXTOUCH 100 UNIT/ML SOPN FlexTouch Pen Inject 10 Units into the skin at bedtime.  03/26/17   [provider]  VITAMIN E PO Take 1 tablet by mouth 2 (two) times daily.    [provider]    Allergies Atorvastatin, Lantus [insulin glargine], and Lisinopril  Family History  Problem Relation Age of Onset  . Other Mother        died from surgery  . AAA (abdominal aortic aneurysm) Mother   . Other Father        brain atrophy - unknown origin  . Diabetes Father        controlled by diet    Social History Social History   Tobacco Use  . Smoking status: Former  Smoker    Packs/day: 2.00    Years: 12.00    Pack years: 24.00    Types: Cigarettes    Quit date: 05/26/1975    Years since quitting: 44.6  . Smokeless tobacco: Never Used  Vaping Use  . Vaping Use: Never used  Substance Use Topics  . Alcohol use: No  . Drug use: Never    Review of Systems Constitutional: No fever/chills Cardiovascular: Denies chest pain. Respiratory: Denies shortness of breath. Gastrointestinal: No abdominal pain.  No nausea, no vomiting.   Genitourinary: Negative for dysuria. Musculoskeletal: Positive for low back pain.  Negative for radiculopathy. Skin: Negative for rash. Neurological: Negative for headaches, focal weakness or numbness.  ____________________________________________  PHYSICAL EXAM:  VITAL SIGNS: ED Triage Vitals  Enc Vitals Group     BP 01/21/20 0603 (!) 146/68     Pulse Rate 01/21/20 0603 67     Resp 01/21/20 0603 17     Temp 01/21/20 0603 99.6 F (37.6 C)     Temp Source 01/21/20 0603 Oral     SpO2 01/21/20 0603 95 %     Weight 01/21/20 0604 146 lb (66.2 kg)     Height 01/21/20 0604 $RemoveBefor'5\' 4"'JfwEEUwSanIK$  (1.626 m)     Head Circumference --      Peak Flow --      Pain Score 01/21/20 0610 6     Pain Loc --      Pain Edu? --      Excl. in [place]? --     Constitutional: Alert and oriented. Well appearing and in no acute distress. Eyes: Conjunctivae are normal. PERRL. EOMI. Head: Atraumatic. Neck: No stridor.   Cardiovascular: Normal rate, regular rhythm. Grossly normal heart sounds.  Good peripheral circulation. Respiratory: Normal respiratory effort.  No retractions. Lungs CTAB. Gastrointestinal: Soft and nontender. No distention.  Bowel sounds are normoactive x4 quadrants.  No abdominal bruits. No CVA tenderness. Musculoskeletal: On examination of the back there is no gross deformity.  No tenderness is noted on palpation of the thoracic spine.  Upper lumbar nontender however there is diffuse tenderness noted on palpation of the lower lumbar and  bilateral sacral areas.  Moderate tenderness paravertebral muscles bilaterally.  Range of motion is restricted secondary to increased pain and guarding due to pain.  Sitting straight leg raises mildly positive at 45 degrees bilaterally. Neurologic:  Normal speech and language. No gross focal neurologic deficits are appreciated.  Skin:  Skin is warm, dry and intact. No rash noted. Psychiatric: Mood and affect are normal. Speech and behavior are normal.  ____________________________________________   LABS (all labs ordered are listed, but only abnormal results are displayed)  Labs Reviewed - No data to display ____________________________________________  RADIOLOGY   Official radiology report(s): DG Lumbar Spine 2-3 Views  Result Date: 01/21/2020 CLINICAL DATA:  Pain in the lumbar spine.  No trauma. EXAM: LUMBAR SPINE - 2-3 VIEW COMPARISON:  None. FINDINGS: There is no evidence of lumbar spine fracture. There is 3 mm anterolisthesis of L4 on L5. Mild degenerative changes are seen in the lower lumbar spine. Intervertebral disc spaces are maintained. IMPRESSION: Anterolisthesis of L4 on L5 measures 3 mm. Mild degenerative changes in the lower lumbar spine. Electronically Signed   By: Zerita Boers M.D.   On: 01/21/2020 12:40    ____________________________________________   PROCEDURES  Procedure(s) performed (including Critical Care):  Procedures   ____________________________________________   INITIAL IMPRESSION / ASSESSMENT AND PLAN / ED COURSE  As part of my medical decision making, I reviewed the following data within the electronic MEDICAL RECORD NUMBER Notes from prior ED visits and [place] Controlled Substance Database  Natasha [name] was evaluated in Emergency Department on 01/21/2020 for the symptoms described in the history of present illness. She was evaluated in the context of the global COVID-19 pandemic, which necessitated consideration that the patient might be at risk for  infection with the SARS-CoV-2 virus that causes COVID-19. Institutional protocols and algorithms that pertain to the evaluation of patients at risk for COVID-19 are in a state of rapid change based on information released by regulatory bodies including the CDC and federal and state organizations. These policies and algorithms were followed during the patient's care  in the ED.  79 year old female presents to the ED with complaint of low back pain that started Friday after she bent over.  Patient states she had no pain in her back before that but has had problems with her back in the past and the reason why she had some oxycodone at home.  She took 1 last evening and 1 at 5 AM today with mild relief.  She states that each time she moves there are muscle spasms which increases her pain.  Patient has been doing moderate amount of work around the house including painting and cleaning.  She denies any paresthesias or incontinence of bowel or bladder.  Patient was given oxycodone 5 mg and diazepam 2 mg while in the ED.  X-rays did not show any acute bony injury and patient was made aware.  A Lidoderm patch was applied to her back and patient was feeling much better prior to discharge.  She is encouraged to follow-up with her PCP if any continued problems.  She was discharged with Percocet, diazepam 2 mg and Lidoderm patches.  She is aware that the medications could cause drowsiness and increase her risk for falling.  Encouraged to use ice or heat to her back as needed for discomfort.  Patient was discharged in improved. ____________________________________________   FINAL CLINICAL IMPRESSION(S) / ED DIAGNOSES  Final diagnoses:  Strain of lumbar region, initial encounter     ED Discharge Orders         Ordered    diazepam (VALIUM) 2 MG tablet  Every 8 hours PRN        01/21/20 1258    oxyCODONE (ROXICODONE) 5 MG immediate release tablet  Every 6 hours PRN        01/21/20 1258    lidocaine (LIDODERM) 5 %   Every 12 hours        01/21/20 1259           Note:  This document was prepared using Dragon voice recognition software and may include unintentional dictation errors.    Johnn Hai, PA-C 01/21/20 1444    Lucrezia Starch, MD 01/21/20 [phone]

## 2020-02-01 LAB — HM DIABETES EYE EXAM

## 2020-02-02 ENCOUNTER — Other Ambulatory Visit: Payer: Self-pay

## 2020-02-02 ENCOUNTER — Encounter: Payer: Self-pay | Admitting: Family Medicine

## 2020-02-02 ENCOUNTER — Ambulatory Visit (INDEPENDENT_AMBULATORY_CARE_PROVIDER_SITE_OTHER): Payer: Medicare Other | Admitting: Family Medicine

## 2020-02-02 VITALS — BP 116/60 | HR 86 | Temp 97.9°F | Ht 64.0 in | Wt 147.5 lb

## 2020-02-02 DIAGNOSIS — E782 Mixed hyperlipidemia: Secondary | ICD-10-CM

## 2020-02-02 DIAGNOSIS — Z9989 Dependence on other enabling machines and devices: Secondary | ICD-10-CM | POA: Diagnosis not present

## 2020-02-02 DIAGNOSIS — M5442 Lumbago with sciatica, left side: Secondary | ICD-10-CM

## 2020-02-02 DIAGNOSIS — G4733 Obstructive sleep apnea (adult) (pediatric): Secondary | ICD-10-CM | POA: Diagnosis not present

## 2020-02-02 MED ORDER — ROSUVASTATIN CALCIUM 20 MG PO TABS: 20.0000 mg | ORAL_TABLET | Freq: Every day | ORAL | 3 refills | Status: DC

## 2020-02-02 NOTE — Patient Instructions (Signed)
Good to see you today  I have put in a referral to physical therapy  If anything changes, let me know

## 2020-02-02 NOTE — Progress Notes (Signed)
Subjective:    Patient ID: Natasha [name], female    DOB: 03-17-1941, 79 y.o.   MRN: [id number]  HPI Chief Complaint  Patient presents with  . Back Pain    lower x2 weeks   This is a 79 yo female who presents today for above cc. Accompanied by her husband.  Has had similar pain prior. Started at mid back, has moved low. Has seen pain management, neurosurgeon, had injections. Eventually got better on its own. Currently with pain across low back, aching, stabbing with certain movements, pain with some sitting, prolonged standing. Pain with bending over. Was seen in ER 01/21/2020, was given diazepam, lidocaine patch (no relief), oxycodone.  Temporary relief with position change, heat, massage.  No change in bowel or bladder.  No weakness or falls. Pain started with bending over at waist to install flooring in several rooms.  She is not doing any heavy lifting.  She was unable to kneel due to prior knee injury.  Now pain intermittently radiates into left hip and calf.  She is frustrated because she is unable to complete activities related to their recent move.  Records from prior work-up, MRIs available in the EMR.  OSA with CPAP-she continues to have problems getting new filter for her CPAP machine.  It has been recalled by the manufacturer.  Since her machine is less than 99 years old, she does not qualify for replacement unless her current machine is damaged, lost.  Hyperlipidemia-she requests refill of her rosuvastatin today.  She had lipid panel done 04/05/2019 with LDL 40.  Per care everywhere.  Review of Systems Per HPI    Objective:   Physical Exam Vitals reviewed.  Constitutional:      General: She is not in acute distress.    Appearance: Normal appearance. She is normal weight. She is not ill-appearing, toxic-appearing or diaphoretic.  HENT:     Head: Normocephalic and atraumatic.  Eyes:     Extraocular Movements: Extraocular movements intact.     Conjunctiva/sclera:  Conjunctivae normal.     Pupils: Pupils are equal, round, and reactive to light.  Cardiovascular:     Rate and Rhythm: Normal rate and regular rhythm.     Heart sounds: Normal heart sounds.  Pulmonary:     Effort: Pulmonary effort is normal.     Breath sounds: Normal breath sounds.  Musculoskeletal:     Cervical back: Normal, normal range of motion and neck supple.     Thoracic back: Normal.     Lumbar back: No swelling, deformity, spasms, tenderness or bony tenderness. Normal range of motion. Negative right straight leg raise test and negative left straight leg raise test.     Comments: Slight stiffness with standing from sitting.  Slight ataxic gait.  Able to ambulate and navigate exam table.  UEs/LE strength 5/5.  Full range of motion, pain of low back with left hip internal rotation.  No pain with palpation of hips.  Skin:    General: Skin is warm and dry.  Neurological:     Mental Status: She is alert and oriented to person, place, and time.  Psychiatric:        Mood and Affect: Mood normal.        Behavior: Behavior normal.        Thought Content: Thought content normal.        Judgment: Judgment normal.       BP 116/60   Pulse 86   Temp 97.9 F (36.6  C) (Temporal)   Ht $R'5\' 4"'vz$  (1.626 m)   Wt 147 lb 8 oz (66.9 kg)   SpO2 96%   BMI 25.32 kg/m  Wt Readings from Last 3 Encounters:  02/02/20 147 lb 8 oz (66.9 kg)  01/21/20 146 lb (66.2 kg)  12/25/19 146 lb 8 oz (66.5 kg)       Assessment & Plan:  1. Acute midline low back pain with left-sided sciatica -No red flags, acute on chronic seeming -Follow-up precautions reviewed.  Discussed over-the-counter analgesics, massage, ice/heat, topical therapies, stretching - Ambulatory referral to Physical Therapy  2. Mixed hyperlipidemia - rosuvastatin (CRESTOR) 20 MG tablet; Take 1 tablet (20 mg total) by mouth at bedtime.  Dispense: 90 tablet; Refill: 3  3. OSA on CPAP - she is continuing to work with the company regarding  recalled machine  - follow up if worsening/ no improvement   Clarene Reamer, FNP-BC  [facility], [place]  02/03/2020 12:01 PM

## 2020-02-03 ENCOUNTER — Encounter: Payer: Self-pay | Admitting: Family Medicine

## 2020-02-03 DIAGNOSIS — K5732 Diverticulitis of large intestine without perforation or abscess without bleeding: Secondary | ICD-10-CM | POA: Insufficient documentation

## 2020-02-03 DIAGNOSIS — Z96612 Presence of left artificial shoulder joint: Secondary | ICD-10-CM | POA: Insufficient documentation

## 2020-02-07 ENCOUNTER — Ambulatory Visit: Payer: Medicare Other | Admitting: Family Medicine

## 2020-02-08 ENCOUNTER — Encounter: Payer: Self-pay | Admitting: Family Medicine

## 2020-03-11 ENCOUNTER — Telehealth: Payer: Self-pay | Admitting: Family Medicine

## 2020-03-11 NOTE — Telephone Encounter (Signed)
Pt got in touch w/Medicare and says they have agreed to replace her CPAP machine.  She request that you please order machine from the people you deal with.  Thank you!

## 2020-03-13 ENCOUNTER — Other Ambulatory Visit: Payer: Self-pay | Admitting: Family Medicine

## 2020-03-13 DIAGNOSIS — G4733 Obstructive sleep apnea (adult) (pediatric): Secondary | ICD-10-CM

## 2020-03-13 NOTE — Telephone Encounter (Signed)
I have placed the order and notified [employer] [facility]

## 2020-03-13 NOTE — Progress Notes (Signed)
Orders entered for new CPAP machine/ supplies.

## 2020-03-18 ENCOUNTER — Telehealth: Payer: Self-pay | Admitting: Family Medicine

## 2020-03-18 NOTE — Telephone Encounter (Signed)
Natasha [name] called from [facility] and wanted to know if we have a copy of MRI report about a month ago of her Back  and can it be faxed to [phone]

## 2020-03-20 NOTE — Telephone Encounter (Signed)
Copy of MRI  faxed to [facility].

## 2020-03-28 ENCOUNTER — Telehealth: Payer: Self-pay

## 2020-03-28 NOTE — Telephone Encounter (Signed)
Per representative from [employer], missing provider's NPI# on [place] order. Representative request resubmit order w/NPI#.  Order placed in [place] need signature.

## 2020-03-29 NOTE — Telephone Encounter (Signed)
Order faxed and original copy placed in scan folder.

## 2020-03-29 NOTE — Telephone Encounter (Signed)
Order signed and returned to be faxed.

## 2020-04-01 NOTE — Telephone Encounter (Signed)
Lvm, order was faxed on 03/29/2020

## 2020-04-01 NOTE — Telephone Encounter (Signed)
Patient called and LM on my voicemail regarding her CPAP  She states that she still have not heard from [employer].  Requesting a call back from CMA  Please advise, thanks.

## 2020-04-02 NOTE — Telephone Encounter (Signed)
(  04/01/2020 note disregard, wrong encounter.)  Called pt and she haven't received her new CPAP.  Advised pt I will call [facility] to check status of order.  [place] and per representative pt's CPAP is on recall list. Representative provided me with instructions for going on website to register pt for CPAP. [email or web address].  Called pt. Pt states she registered with [employer], heard back from them abt a month ago.  Per pt [employer] is still working on it and trying to get answers abt the new CPAP.  Pt also stated that medicare will replace the machine. I will call pt back due to now exactly sure who we should contact at this point.   Tried to call pt to advised that she will have to follow up with the company. No answer.

## 2020-04-02 NOTE — Telephone Encounter (Signed)
Called pt advised she would have to f/u with medicare. (per advice that was given to me.)

## 2020-04-06 ENCOUNTER — Other Ambulatory Visit: Payer: Self-pay | Admitting: Family Medicine

## 2020-06-20 NOTE — Telephone Encounter (Signed)
Patient came into the office very upset. She was told by medicare (2 reps) that she needed to report the machine as broken and get a new order from her provider to get one. Patient is upset and would like a order submitted to get a new one. EM

## 2020-06-21 NOTE — Telephone Encounter (Signed)
Community msg sent to [employer] and Jonn Shingles at [employer] to look into this for the pt.

## 2020-06-24 ENCOUNTER — Ambulatory Visit (INDEPENDENT_AMBULATORY_CARE_PROVIDER_SITE_OTHER): Payer: Medicare Other | Admitting: Cardiology

## 2020-06-24 ENCOUNTER — Other Ambulatory Visit: Payer: Self-pay

## 2020-06-24 ENCOUNTER — Encounter: Payer: Self-pay | Admitting: Cardiology

## 2020-06-24 VITALS — BP 124/70 | HR 71 | Ht 64.0 in | Wt 152.1 lb

## 2020-06-24 DIAGNOSIS — I350 Nonrheumatic aortic (valve) stenosis: Secondary | ICD-10-CM | POA: Diagnosis not present

## 2020-06-24 DIAGNOSIS — E7800 Pure hypercholesterolemia, unspecified: Secondary | ICD-10-CM | POA: Diagnosis not present

## 2020-06-24 DIAGNOSIS — I480 Paroxysmal atrial fibrillation: Secondary | ICD-10-CM

## 2020-06-24 NOTE — Progress Notes (Signed)
Cardiology Office Note:    Date:  06/24/2020   ID:  Natasha [name], DOB January 10, 1941, MRN [id number]  PCP:  Emi Belfast, FNP (Inactive)  [facility] Cardiologist:  Debbe Odea, MD  [facility] Electrophysiologist:  None   Referring MD: Emi Belfast, FNP   Chief Complaint  Patient presents with  . Other    6 month f/u no complaints today. Meds reviewed verbally with pt.    History of Present Illness:    Natasha [name] is a 80 y.o. female with a hx of hypertension, mild aortic stenosis, hyperlipidemia, paroxysmal atrial fibrillation on Eliquis who presents for follow-up.   Patient being seen for atrial fibrillation.  Continue sinus rhythm off beta-blockers.  Compliant with Eliquis, has no bleeding issues.  States having rare palpitations which very infrequent.  Denies chest pain or shortness of breath.  Has no other issues at this time.  Prior notes  patient recently moved to the area from [place] to be closer to family.  Has a history of paroxysmal atrial fibrillation, diagnosed in 2019 while in the hospital after gastric bypass surgery.  Patient takes Eliquis.  She states having occasional episodes of palpitations since.    Outside echocardiogram  06/2017 showed normal ejection fraction, EF 65 to 70%, moderate LVH, mild AS.  Cardiac monitor on 12/2017 showed less than 1% atrial fibrillation all occurance.  Past Medical History:  Diagnosis Date  . A-fib (HCC)   . Chronic cough   . Diabetes mellitus without complication (HCC)   . Diverticulitis   . High cholesterol   . Hypertension   . Murmur   . Restless leg   . Sleep apnea     Past Surgical History:  Procedure Laterality Date  . ABDOMINAL HYSTERECTOMY  1978  . APPENDECTOMY  1978  . BREAST SURGERY    . CHOLECYSTECTOMY    . ELBOW SURGERY    . FRACTURE SURGERY    . 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 KNEE SURGERY    . TOTAL SHOULDER REPLACEMENT    . WRIST SURGERY      Current Medications: Current  Meds  Medication Sig  . acetaminophen (TYLENOL) 500 MG tablet Take 1,500 mg by mouth every 6 (six) hours as needed for mild pain.  . calcium carbonate (OS-CAL - DOSED IN MG OF ELEMENTAL CALCIUM) 1250 (500 Ca) MG tablet Take 1 tablet by mouth 2 (two) times daily with a meal.  . ELIQUIS 5 MG TABS tablet TAKE 1 TABLET BY MOUTH TWICE A DAY  . glucose blood ([employer] BLOOD GLUCOSE TEST) test strip Dx E 11.49  . HYDROmorphone (DILAUDID) 2 MG tablet Take by mouth as needed.   Marland Kitchen JANUMET 50-1000 MG tablet Take 1 tablet by mouth daily at 2 am.  . Multiple Vitamin (MULTIVITAMIN WITH MINERALS) TABS tablet Take 1 tablet by mouth daily.  Marland Kitchen oxyCODONE (ROXICODONE) 5 MG immediate release tablet Take 1 tablet (5 mg total) by mouth every 6 (six) hours as needed for severe pain.  . pramipexole (MIRAPEX) 1 MG tablet Take 1 tablet (1 mg total) by mouth at bedtime.  . rosuvastatin (CRESTOR) 20 MG tablet Take 1 tablet (20 mg total) by mouth at bedtime.  . sertraline (ZOLOFT) 50 MG tablet Take 1 tablet (50 mg total) by mouth at bedtime.  . TRESIBA FLEXTOUCH 100 UNIT/ML SOPN FlexTouch Pen Inject 10 Units into the skin at bedtime.   Marland Kitchen VITAMIN E PO Take 1 tablet by mouth 2 (two) times daily.  Allergies:   Atorvastatin, Lantus [insulin glargine], and Lisinopril   Social History   Socioeconomic History  . Marital status: Married    Spouse name: Not on file  . Number of children: Not on file  . Years of education: Not on file  . Highest education level: Not on file  Occupational History  . Not on file  Tobacco Use  . Smoking status: Former Smoker    Packs/day: 2.00    Years: 12.00    Pack years: 24.00    Types: Cigarettes    Quit date: 05/26/1975    Years since quitting: 45.1  . Smokeless tobacco: Never Used  Vaping Use  . Vaping Use: Never used  Substance and Sexual Activity  . Alcohol use: No  . Drug use: Never  . Sexual activity: Not on file  Other Topics Concern  . Not on file  Social History  Narrative   Married to [employer] to [facility] 6/21 to be near family   Enjoys making jewelery, going to art shows, gardening and painting   Social Determinants of Health   Financial Resource Strain: Not on file  Food Insecurity: Not on file  Transportation Needs: Not on file  Physical Activity: Not on file  Stress: Not on file  Social Connections: Not on file     Family History: The patient's family history includes AAA (abdominal aortic aneurysm) in her mother; Diabetes in her father; Other in her father and mother.  ROS:   Please see the history of present illness.     All other systems reviewed and are negative.  EKGs/Labs/Other Studies Reviewed:    The following studies were reviewed today:   EKG:  EKG is ordered today.  Patient was normal sinus rhythm, normal ECG.  Recent Labs: No results found for requested labs within last 8760 hours.  Recent Lipid Panel No results found for: CHOL, TRIG, HDL, CHOLHDL, VLDL, LDLCALC, LDLDIRECT  Physical Exam:    VS:  BP 124/70 (BP Location: Left Arm, Patient Position: Sitting, Cuff Size: Normal)   Pulse 71   Ht $R'5\' 4"'vX$  (1.626 m)   Wt 152 lb 2 oz (69 kg)   SpO2 97%   BMI 26.11 kg/m     Wt Readings from Last 3 Encounters:  06/24/20 152 lb 2 oz (69 kg)  02/02/20 147 lb 8 oz (66.9 kg)  01/21/20 146 lb (66.2 kg)     GEN:  Well nourished, well developed in no acute distress HEENT: Normal NECK: No JVD; No carotid bruits LYMPHATICS: No lymphadenopathy CARDIAC: RRR, systolic murmur RESPIRATORY:  Clear to auscultation without rales, wheezing or rhonchi  ABDOMEN: Soft, non-tender, non-distended MUSCULOSKELETAL:  no edema; No deformity  SKIN: Warm and dry NEUROLOGIC:  Alert and oriented x 3 PSYCHIATRIC:  Normal affect   ASSESSMENT:    1. Paroxysmal atrial fibrillation (HCC)   2. Aortic valve stenosis, etiology of cardiac valve disease unspecified   3. Pure hypercholesterolemia    PLAN:    In order of problems listed  above:  1. Patient with history of paroxysmal atrial fibrillation.  Currently in sinus rhythm.  CHA2DS2-VASc score of 4 (age, htn, gender).  Continue Eliquis 5 mg twice daily.  Last echo with preserved EF. 2. History of mild aortic valve stenosis.  Mild aortic valve sclerosis/stenosis.  Serial monitoring with echocardiogram. per [facility] AHA guidelines every 3 to 5 years. 3. Hx of hld.  Continue Crestor.  Follow-up in 6 months.  Total encounter time 35  minutes  Greater than 50% was spent in counseling and coordination of care with the patient   This note was generated in part or whole with voice recognition software. Voice recognition is usually quite accurate but there are transcription errors that can and very often do occur. I apologize for any typographical errors that were not detected and corrected.  Medication Adjustments/Labs and Tests Ordered: Current medicines are reviewed at length with the patient today.  Concerns regarding medicines are outlined above.  Orders Placed This Encounter  Procedures  . EKG 12-Lead   No orders of the defined types were placed in this encounter.   Patient Instructions  Medication Instructions:  Your physician recommends that you continue on your current medications as directed. Please refer to the Current Medication list given to you today.  *If you need a refill on your cardiac medications before your next appointment, please call your pharmacy*   Lab Work: None ordered If you have labs (blood work) drawn today and your tests are completely normal, you will receive your results only by: Marland Kitchen MyChart Message (if you have MyChart) OR . A paper copy in the mail If you have any lab test that is abnormal or we need to change your treatment, we will call you to review the results.   Testing/Procedures: None ordered   Follow-Up: At [facility], you and your health needs are our priority.  As part of our continuing mission to provide you with  exceptional heart care, we have created designated Provider Care Teams.  These Care Teams include your primary Cardiologist (physician) and Advanced Practice Providers (APPs -  Physician Assistants and Nurse Practitioners) who all work together to provide you with the care you need, when you need it.  We recommend signing up for the patient portal called "MyChart".  Sign up information is provided on this After Visit Summary.  MyChart is used to connect with patients for Virtual Visits (Telemedicine).  Patients are able to view lab/test results, encounter notes, upcoming appointments, etc.  Non-urgent messages can be sent to your provider as well.   To learn more about what you can do with MyChart, go to [email or web address].    Your next appointment:   6 month(s)  The format for your next appointment:   In Person  Provider:   Kate Sable, MD   Other Instructions       Signed, Kate Sable, MD  06/24/2020 4:41 PM    [place]

## 2020-06-24 NOTE — Patient Instructions (Signed)

## 2020-06-26 NOTE — Telephone Encounter (Signed)
Contacted pt and she reports she just needs suggestions of DMEs to talk to about ordering a new CPAP. [facility] and [facility]. Pt reported she did not need anything further and was appreciative of the call.

## 2020-07-22 ENCOUNTER — Ambulatory Visit (INDEPENDENT_AMBULATORY_CARE_PROVIDER_SITE_OTHER): Payer: Medicare Other | Admitting: Family Medicine

## 2020-07-22 ENCOUNTER — Other Ambulatory Visit: Payer: Self-pay

## 2020-07-22 ENCOUNTER — Encounter: Payer: Self-pay | Admitting: Family Medicine

## 2020-07-22 VITALS — BP 142/80 | HR 87 | Temp 98.0°F | Ht 64.0 in | Wt 151.8 lb

## 2020-07-22 DIAGNOSIS — E782 Mixed hyperlipidemia: Secondary | ICD-10-CM | POA: Diagnosis not present

## 2020-07-22 DIAGNOSIS — D649 Anemia, unspecified: Secondary | ICD-10-CM

## 2020-07-22 DIAGNOSIS — I480 Paroxysmal atrial fibrillation: Secondary | ICD-10-CM | POA: Diagnosis not present

## 2020-07-22 DIAGNOSIS — E1169 Type 2 diabetes mellitus with other specified complication: Secondary | ICD-10-CM

## 2020-07-22 DIAGNOSIS — Z794 Long term (current) use of insulin: Secondary | ICD-10-CM

## 2020-07-22 DIAGNOSIS — G2581 Restless legs syndrome: Secondary | ICD-10-CM

## 2020-07-22 DIAGNOSIS — M48061 Spinal stenosis, lumbar region without neurogenic claudication: Secondary | ICD-10-CM

## 2020-07-22 DIAGNOSIS — G4733 Obstructive sleep apnea (adult) (pediatric): Secondary | ICD-10-CM

## 2020-07-22 NOTE — Patient Instructions (Addendum)
#  Restless legs Labs today Hand out Continue current medication  Ears looked good  #Referral I have placed a referral to a specialist for you. You should receive a phone call from the specialty office. Make sure your voicemail is not full and that if you are able to answer your phone to unknown or new numbers.   It may take up to 2 weeks to hear about the referral. If you do not hear anything in 2 weeks, please call our office and ask to speak with the referral coordinator.   Please call the location of your choice from the menu below to schedule your Mammogram and/or Bone Density appointment.     [place]  [address] [place] at [facility]   Phone:  [phone]   [place], [place] [address]                                            Services: 3D Mammogram and Bone Density

## 2020-07-22 NOTE — Progress Notes (Signed)
Subjective:     Natasha [name] is a 80 y.o. female presenting for Transitions Of Care (Needs new CPAP machine ASAP. Hers was recalled. May need sleep test. ), Ear Pain (Crackling in L ear ), and restless leg (Would like either new med or increase dosage )     HPI  #Sleep apnea - her CPAP machine was recalled - was initially diagnosed/treated in [place] - was given some home health agencies - one did not take medicare, the other said she  #restless legs - getting worse - taking mirapex - wondering about dose change  - not doing any stretches or nighttime regimen - symptoms bothersome even with daytime sitting  #Left ear crackling - concerned about coming infection - getting pressure change - no pain - hearing if OK  No results found for: HGBA1C   Review of Systems   Social History   Tobacco Use  Smoking Status Former Smoker  . Packs/day: 2.00  . Years: 12.00  . Pack years: 24.00  . Types: Cigarettes  . Quit date: 05/26/1975  . Years since quitting: 45.1  Smokeless Tobacco Never Used        Objective:    BP Readings from Last 3 Encounters:  07/22/20 (!) 142/80  06/24/20 124/70  02/02/20 116/60   Wt Readings from Last 3 Encounters:  07/22/20 151 lb 12 oz (68.8 kg)  06/24/20 152 lb 2 oz (69 kg)  02/02/20 147 lb 8 oz (66.9 kg)    BP (!) 142/80   Pulse 87   Temp 98 F (36.7 C) (Temporal)   Ht $R'5\' 4"'Oe$  (1.626 m)   Wt 151 lb 12 oz (68.8 kg)   SpO2 97%   BMI 26.05 kg/m    Physical Exam Constitutional:      General: She is not in acute distress.    Appearance: She is well-developed. She is not diaphoretic.  HENT:     Right Ear: Tympanic membrane and external ear normal.     Left Ear: Tympanic membrane and external ear normal.     Nose: Nose normal.  Eyes:     Conjunctiva/sclera: Conjunctivae normal.  Cardiovascular:     Rate and Rhythm: Normal rate and regular rhythm.     Heart sounds: No murmur heard.   Pulmonary:     Effort: Pulmonary effort is  normal. No respiratory distress.     Breath sounds: Normal breath sounds. No wheezing.  Musculoskeletal:     Cervical back: Neck supple.  Skin:    General: Skin is warm and dry.     Capillary Refill: Capillary refill takes less than 2 seconds.  Neurological:     Mental Status: She is alert. Mental status is at baseline.  Psychiatric:        Mood and Affect: Mood normal.        Behavior: Behavior normal.           Assessment & Plan:   Problem List Items Addressed This Visit      Cardiovascular and Mediastinum   Paroxysmal atrial fibrillation ([place])    Follows with Dr. Garen Lah. Controlled on eliquis 5 mg.       Relevant Orders   CBC     Respiratory   OSA (obstructive sleep apnea)    CPAP machine was recalled. Failed attempts to get replacement. Will placed new referral.       Relevant Orders   Ambulatory referral to Sleep Studies     Endocrine   Type 2  diabetes mellitus with other specified complication [facility])    Follows with endocrinology. Last Hemoglobin a1c 9.2 % but endocrine did not make changes. Cont Tresiba 10 units and Janumet 50-1000 mg      Relevant Orders   Comprehensive metabolic panel     Other   Mixed hyperlipidemia    Check labs. Cont crestor 20 mg      Relevant Orders   Comprehensive metabolic panel   Lipid panel   Normocytic anemia   Relevant Orders   Ferritin   RLS (restless legs syndrome) - Primary    Already on maximum pramipexole 1 mg. Advised home exercise and hydration      Relevant Orders   Comprehensive metabolic panel   CBC   Ferritin   Lipid panel   Spinal stenosis of lumbar region without neurogenic claudication    S/p recent ER visit and ortho referral. Getting ready to see specialist for manipulation.           Return in about 3 months (around 10/19/2020) for for follow-up.  Lesleigh Noe, MD  This visit occurred during the SARS-CoV-2 public health emergency.  Safety protocols were in place, including  screening questions prior to the visit, additional usage of staff PPE, and extensive cleaning of exam room while observing appropriate contact time as indicated for disinfecting solutions.

## 2020-07-22 NOTE — Assessment & Plan Note (Signed)
CPAP machine was recalled. Failed attempts to get replacement. Will placed new referral.

## 2020-07-22 NOTE — Assessment & Plan Note (Signed)
Follows with endocrinology. Last Hemoglobin a1c 9.2 % but endocrine did not make changes. Cont Tresiba 10 units and Janumet 50-1000 mg

## 2020-07-22 NOTE — Assessment & Plan Note (Signed)
Check labs. Cont crestor 20 mg

## 2020-07-22 NOTE — Assessment & Plan Note (Signed)
Already on maximum pramipexole 1 mg. Advised home exercise and hydration

## 2020-07-22 NOTE — Assessment & Plan Note (Signed)
Follows with Dr. Garen Lah. Controlled on eliquis 5 mg.

## 2020-07-22 NOTE — Assessment & Plan Note (Signed)
S/p recent ER visit and ortho referral. Getting ready to see specialist for manipulation.

## 2020-07-23 LAB — COMPREHENSIVE METABOLIC PANEL
ALT: 30 U/L (ref 0–35)
AST: 44 U/L — ABNORMAL HIGH (ref 0–37)
Albumin: 4.1 g/dL (ref 3.5–5.2)
Alkaline Phosphatase: 77 U/L (ref 39–117)
BUN: 23 mg/dL (ref 6–23)
CO2: 24 mEq/L (ref 19–32)
Calcium: 9.3 mg/dL (ref 8.4–10.5)
Chloride: 104 mEq/L (ref 96–112)
Creatinine, Ser: 1.3 mg/dL — ABNORMAL HIGH (ref 0.40–1.20)
GFR: 38.96 mL/min — ABNORMAL LOW (ref >60.00)
Glucose, Bld: 270 mg/dL — ABNORMAL HIGH (ref 70–99)
Potassium: 4.6 mEq/L (ref 3.5–5.1)
Sodium: 137 mEq/L (ref 135–145)
Total Bilirubin: 0.3 mg/dL (ref 0.2–1.2)
Total Protein: 6.1 g/dL (ref 6.0–8.3)

## 2020-07-23 LAB — FERRITIN: Ferritin: 21.2 ng/mL (ref 10.0–291.0)

## 2020-07-23 LAB — CBC
HCT: 33.8 % — ABNORMAL LOW (ref 36.0–46.0)
Hemoglobin: 11.5 g/dL — ABNORMAL LOW (ref 12.0–15.0)
MCHC: 33.9 g/dL (ref 30.0–36.0)
MCV: 89.4 fl (ref 78.0–100.0)
Platelets: 240 10*3/uL (ref 150.0–400.0)
RBC: 3.79 Mil/uL — ABNORMAL LOW (ref 3.87–5.11)
RDW: 13.4 % (ref 11.5–15.5)
WBC: 9.2 10*3/uL (ref 4.0–10.5)

## 2020-07-23 LAB — LIPID PANEL
Cholesterol: 132 mg/dL (ref 0–200)
HDL: 46.2 mg/dL (ref >39.00)
NonHDL: 85.77
Total CHOL/HDL Ratio: 3
Triglycerides: 214 mg/dL — ABNORMAL HIGH (ref 0.0–149.0)
VLDL: 42.8 mg/dL — ABNORMAL HIGH (ref 0.0–40.0)

## 2020-07-23 LAB — LDL CHOLESTEROL, DIRECT: Direct LDL: 56 mg/dL

## 2020-07-24 ENCOUNTER — Other Ambulatory Visit: Payer: Self-pay | Admitting: Family Medicine

## 2020-07-24 DIAGNOSIS — Z1231 Encounter for screening mammogram for malignant neoplasm of breast: Secondary | ICD-10-CM

## 2020-08-01 ENCOUNTER — Telehealth: Payer: Self-pay | Admitting: Cardiology

## 2020-08-01 NOTE — Telephone Encounter (Signed)
   [facility] Pre-operative Risk Assessment    HEARTCARE STAFF: - Please ensure there is not already an duplicate clearance open for this procedure. - Under Visit Info/Reason for Call, type in Other and utilize the format Clearance MM/DD/YY or Clearance TBD. Do not use dashes or single digits. - If request is for dental extraction, please clarify the # of teeth to be extracted.  Request for surgical clearance:  1. What type of surgery is being performed? Left I5/S1 Tf ESI  2. When is this surgery scheduled? 08/06/20  3. What type of clearance is required (medical clearance vs. Pharmacy clearance to hold med vs. Both)? both  4. Are there any medications that need to be held prior to surgery and how long? Hold Eliquis for 3 days prior  5. Practice name and name of physician performing surgery? [facility] - Dr [facility]  6. What is the office phone number? [phone]   7.   What is the office fax number? [phone]  8.   Anesthesia type (None, local, MAC, general) ? Not listed    Natasha [name] 08/01/2020, 1:07 PM  _________________________________________________________________   (provider comments below)

## 2020-08-01 NOTE — Telephone Encounter (Signed)
Patient with diagnosis of afib on Eliquis for anticoagulation.    Procedure: Left I5/S1 Tf ESI Date of procedure: 08/06/20  [id number] Score = 5  {Click here to calculate score.  REFRESH note before signing.       :[id number] indicates a 7.2% annual risk of stroke. The patient's score is based upon: CHF History: No HTN History: Yes Diabetes History: Yes Stroke History: No Vascular Disease History: No Age Score: 2 Gender Score: 1     CrCl 29.8 ml/min  Per office protocol, patient can hold Eliquis for 3 days prior to procedure.

## 2020-08-01 NOTE — Telephone Encounter (Signed)
   Primary Cardiologist: Kate Sable, MD  Chart reviewed as part of pre-operative protocol coverage. Given past medical history and time since last visit, based on ACC/AHA guidelines, Natasha [name] would be at acceptable risk for the planned procedure without further cardiovascular testing.   Patient with diagnosis of afib on Eliquis for anticoagulation.    Procedure: Left I5/S1 Tf ESI Date of procedure: 08/06/20  CHA2DS2-VASc Score = 5  This indicates a 7.2% annual risk of stroke. The patient's score is based upon: CHF History: No HTN History: Yes Diabetes History: Yes Stroke History: No Vascular Disease History: No Age Score: 2 Gender Score: 1     CrCl 29.8 ml/min  Per office protocol, patient can hold Eliquis for 3 days prior to procedure.  I will route this recommendation to the requesting party via Epic fax function and remove from pre-op pool.  Please call with questions.  [name]. [name] NP-C    08/01/2020, 2:40 PM [place] [address] Office [phone] Fax [phone]

## 2020-08-12 ENCOUNTER — Other Ambulatory Visit: Payer: Self-pay

## 2020-08-12 ENCOUNTER — Ambulatory Visit
Admission: RE | Admit: 2020-08-12 | Discharge: 2020-08-12 | Disposition: A | Discharge status: Home / Self Care | Payer: Medicare Other | Source: Ambulatory Visit | Attending: Family Medicine | Admitting: Family Medicine

## 2020-08-12 DIAGNOSIS — Z1231 Encounter for screening mammogram for malignant neoplasm of breast: Secondary | ICD-10-CM | POA: Diagnosis not present

## 2020-08-13 ENCOUNTER — Inpatient Hospital Stay
Admission: RE | Admit: 2020-08-13 | Discharge: 2020-08-13 | Disposition: A | Discharge status: Home / Self Care | Payer: Self-pay | Source: Ambulatory Visit | Attending: *Deleted | Admitting: *Deleted

## 2020-08-13 ENCOUNTER — Other Ambulatory Visit: Payer: Self-pay | Admitting: *Deleted

## 2020-08-13 DIAGNOSIS — Z1231 Encounter for screening mammogram for malignant neoplasm of breast: Secondary | ICD-10-CM

## 2020-08-29 ENCOUNTER — Telehealth: Payer: Self-pay | Admitting: Family Medicine

## 2020-08-29 NOTE — Telephone Encounter (Signed)
Please call patient and get more information about recent imaging.   Who is Dr. Pearline Cables? Unable to find in the chart.

## 2020-08-29 NOTE — Telephone Encounter (Signed)
Dr. Pearline Cables called and stated has a left lung mass 2cm in size needs a chest CT

## 2020-09-02 ENCOUNTER — Telehealth: Payer: Self-pay | Admitting: Family Medicine

## 2020-09-02 DIAGNOSIS — R911 Solitary pulmonary nodule: Secondary | ICD-10-CM

## 2020-09-02 NOTE — Telephone Encounter (Signed)
Pt called in due to she had a MRI done last Wednesday [email or web address]   and the picked up a spot on her lungs and sent the report to Dr. Einar Pheasant and recommended a CT Scan and she hasnt heard anything else about the results or the next step to do the CT scan.    Please advise

## 2020-09-02 NOTE — Telephone Encounter (Signed)
Imaging is in [place] now.

## 2020-09-02 NOTE — Telephone Encounter (Signed)
When notified last week, was unable to find initial imaging as results not available.    Viewed MRI in care everywhere with 2.2 cm lesion.   CT Chest ordered to evaluate. Routing to referral coordinator to follow-up with patient.

## 2020-09-02 NOTE — Telephone Encounter (Signed)
Patient advised and scheduled for CT at [place] on 09/04/20 at 1:30 pm

## 2020-09-02 NOTE — Telephone Encounter (Signed)
New info in encounter on 09/02/20.

## 2020-09-04 ENCOUNTER — Other Ambulatory Visit: Payer: Self-pay

## 2020-09-04 ENCOUNTER — Ambulatory Visit
Admission: RE | Admit: 2020-09-04 | Discharge: 2020-09-04 | Disposition: A | Discharge status: Home / Self Care | Payer: Medicare Other | Source: Ambulatory Visit | Attending: Family Medicine | Admitting: Family Medicine

## 2020-09-04 DIAGNOSIS — R911 Solitary pulmonary nodule: Secondary | ICD-10-CM | POA: Insufficient documentation

## 2020-09-09 ENCOUNTER — Telehealth: Payer: Self-pay | Admitting: *Deleted

## 2020-09-09 DIAGNOSIS — R911 Solitary pulmonary nodule: Secondary | ICD-10-CM

## 2020-09-09 NOTE — Telephone Encounter (Signed)
Tracey with [facility] called wanting to let Dr. Einar Pheasant know that the CT chest results ae in Epic for her review.

## 2020-09-09 NOTE — Telephone Encounter (Signed)
Noted. Working on this

## 2020-09-09 NOTE — Telephone Encounter (Signed)
Results viewed and pt notified on Friday.   Placing urgent referral to pulmonology to coordinate follow-up for suspicious lesion.   Routing to referral coordinators as Juluis Rainier

## 2020-09-20 ENCOUNTER — Other Ambulatory Visit: Payer: Self-pay

## 2020-09-20 ENCOUNTER — Ambulatory Visit (INDEPENDENT_AMBULATORY_CARE_PROVIDER_SITE_OTHER): Payer: Medicare Other | Admitting: Pulmonary Disease

## 2020-09-20 ENCOUNTER — Telehealth: Payer: Self-pay | Admitting: Pulmonary Disease

## 2020-09-20 ENCOUNTER — Encounter: Payer: Self-pay | Admitting: Pulmonary Disease

## 2020-09-20 VITALS — BP 138/82 | HR 67 | Temp 98.3°F | Ht 64.0 in | Wt 150.4 lb

## 2020-09-20 DIAGNOSIS — R918 Other nonspecific abnormal finding of lung field: Secondary | ICD-10-CM | POA: Diagnosis not present

## 2020-09-20 DIAGNOSIS — I480 Paroxysmal atrial fibrillation: Secondary | ICD-10-CM

## 2020-09-20 DIAGNOSIS — I272 Pulmonary hypertension, unspecified: Secondary | ICD-10-CM

## 2020-09-20 DIAGNOSIS — G4733 Obstructive sleep apnea (adult) (pediatric): Secondary | ICD-10-CM | POA: Diagnosis not present

## 2020-09-20 NOTE — Telephone Encounter (Signed)
Phone pre admit visit 09/24/2020 between 1-5 and covid test 09/26/2020 at 10:15. Patient is aware of dates/time. She voiced her understanding and had no further questions.

## 2020-09-20 NOTE — H&P (View-Only) (Signed)
Subjective:    Patient ID: Natasha [name], female    DOB: June 10, 1940, 80 y.o.   MRN: [id number]  Chief Complaint  Patient presents with  . pulmonary consult    Per Waunita Schooner, MD-- recent CT. C/O chronic cough and sob with exertion.    HPI This is an 80 year old remote former smoker (24-pack-year history) who presents for evaluation of left upper lobe mass, she is kindly referred by Waunita Schooner, MD.  The patient has issues and underwent an MRI of the thoracic and lumbar spine on 28 August 2020 this was done at [facility]  In the thoracic portion of the study an ill-defined mass was noted on the left upper lobe and a CT of the chest was recommended.  The patient underwent CT chest on 04 September 2020.  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  There is no mediastinal adenopathy noted there is possible right axillary lymph node involvement.  The patient has had no fevers, chills or sweats.  No cough or sputum production no hemoptysis.  No weight loss or anorexia.  She has not any chest pain.  She does have issues with chronic back pain for which she takes as needed Dilaudid but she states that usually resting takes care of her pain and tries to avoid taking any medications for it.  She does have a history of paroxysmal atrial fibrillation for which she takes Eliquis.  She did have to stop Eliquis for 3 days were reek last injection and had no difficulty with this.  Her regular cardiologist is Dr. Kate Sable.  Patient has had sleep apnea for many years.  She is on CPAP at 6 cm of water pressure.  She is concerned as she was part of the recall of CPAP machines ([employer]ips) and one of the issues was potential carcinogenic substances on these machines.  She is now utilizing her new machine which he received approximately 2 to 3 weeks ago.  We discussed the potential modes of biopsy for this lesion.  The patient opts  navigational procedure.  This will be done with robotic assisted navigational protocol.  She understands that this will be done under general anesthesia.   Review of Systems A 10 point review of systems was performed and it is as noted above otherwise negative.  Past Medical History:  Diagnosis Date  . A-fib ([place])   . Chronic cough   . Diabetes mellitus without complication ([place])   . Diverticulitis   . High cholesterol   . Hypertension   . Murmur   . Restless leg   . Sleep apnea    Past Surgical History:  Procedure Laterality Date  . ABDOMINAL HYSTERECTOMY  1978  . APPENDECTOMY  1978  . BREAST BIOPSY Left ?   papilloma  . BREAST CYST EXCISION Bilateral yrs ago   benign, scars not well visualized  . BREAST SURGERY    . CHOLECYSTECTOMY    . ELBOW SURGERY    . FRACTURE SURGERY    . 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 KNEE SURGERY    . TOTAL SHOULDER REPLACEMENT    . WRIST SURGERY     Family History  Problem Relation Age of Onset  . Other Mother        died from surgery  . AAA (abdominal aortic aneurysm) Mother   . Diabetes Father        controlled by diet  .  Dementia Father        brain atrophy - unknown origin  . Breast cancer Cousin        maternal   Social History   Tobacco Use  . Smoking status: Former Smoker    Packs/day: 2.00    Years: 12.00    Pack years: 24.00    Types: Cigarettes    Quit date: 05/26/1975    Years since quitting: 45.3  . Smokeless tobacco: Never Used  Substance Use Topics  . Alcohol use: No   Allergies  Allergen Reactions  . Atorvastatin     Muscle/joint aches  . Lantus [Insulin Glargine] Hives  . Lisinopril Hives    Cough     Current Meds  Medication Sig  . acetaminophen (TYLENOL) 500 MG tablet Take 1,500 mg by mouth every 6 (six) hours as needed for mild pain.  . calcium carbonate (OS-CAL - DOSED IN MG OF ELEMENTAL CALCIUM) 1250 (500 Ca) MG tablet Take 1 tablet by mouth 2 (two) times daily with a meal.  . ELIQUIS 5 MG TABS  tablet TAKE 1 TABLET BY MOUTH TWICE A DAY  . glucose blood ([employer] BLOOD GLUCOSE TEST) test strip Dx E 11.49  . HYDROmorphone (DILAUDID) 2 MG tablet Take by mouth as needed.   Marland Kitchen ibuprofen (ADVIL) 200 MG tablet Take 600 mg by mouth as needed.  Marland Kitchen JANUMET 50-1000 MG tablet Take 1 tablet by mouth daily at 2 am.  . Multiple Vitamin (MULTIVITAMIN WITH MINERALS) TABS tablet Take 1 tablet by mouth daily.  Marland Kitchen oxyCODONE (ROXICODONE) 5 MG immediate release tablet Take 1 tablet (5 mg total) by mouth every 6 (six) hours as needed for severe pain.  . pramipexole (MIRAPEX) 1 MG tablet Take 1 tablet (1 mg total) by mouth at bedtime.  . rosuvastatin (CRESTOR) 20 MG tablet Take 1 tablet (20 mg total) by mouth at bedtime.  . sertraline (ZOLOFT) 50 MG tablet Take 1 tablet (50 mg total) by mouth at bedtime.  . TRESIBA FLEXTOUCH 100 UNIT/ML SOPN FlexTouch Pen Inject 10 Units into the skin at bedtime.   Marland Kitchen VITAMIN E PO Take 1 tablet by mouth 2 (two) times daily.   Immunization History  Administered Date(s) Administered  . Influenza Split 03/20/2014  . Influenza, High Dose Seasonal PF 05/08/2020  . Influenza,inj,Quad PF,6+ Mos 03/13/2017, 03/17/2018  . Influenza-Unspecified 03/17/2012, 03/23/2013, 03/22/2014, 03/22/2015, 03/25/2016, 02/23/2019  . Moderna SARS-COV2 Booster Vaccination 07/26/2019  . Moderna Sars-Covid-2 Vaccination 06/28/2019, 05/14/2020  . Pneumococcal Conjugate-13 10/25/2006, 05/11/2014  . Pneumococcal Polysaccharide-23 10/31/2008, 04/06/2012        Objective:   Physical Exam BP 138/82 (BP Location: Left Arm, Cuff Size: Normal)   Pulse 67   Temp 98.3 F (36.8 C) (Temporal)   Ht $R'5\' 4"'Us$  (1.626 m)   Wt 150 lb 6.4 oz (68.2 kg)   SpO2 99%   BMI 25.82 kg/m   GENERAL: Awake, alert, quite spry, fully ambulatory.  No acute distress.  No conversational dyspnea. HEAD: Normocephalic, atraumatic.  EYES: Pupils equal, round, reactive to light.  No scleral icterus.  MOUTH: Nose/mouth/throat not  examined due to masking requirements for COVID 19. NECK: Supple. No thyromegaly. Trachea midline. No JVD.  No adenopathy. PULMONARY: Good air entry bilaterally.  No adventitious sounds. CARDIOVASCULAR: S1 and S2. Regular rate and rhythm.  Grade 1/6 to 2/6 systolic ejection murmur left sternal border. ABDOMEN: Benign. MUSCULOSKELETAL: No joint deformity, no clubbing, no edema.  NEUROLOGIC: No focal deficit, no gait disturbance, speech is fluent. SKIN:  Intact,warm,dry.  No rashes. PSYCH: Mood and behavior normal.  Representative slices of CT scan performed 04 September 2020, independently reviewed shows mass on the left upper lobe:       Assessment & Plan:      ICD-10-CM   1. Mass of upper lobe of left lung  R91.8    Patient will need biopsy Biopsy will be done with robotic navigational assistance Benefits, limitations and complications discussed  2. Paroxysmal atrial fibrillation (HCC)  I48.0    On Eliquis Will need to hold 3 days prior to procedure  3. Pulmonary hypertension (HCC) mild  I27.20    Asymptomatic in this regard  4. OSA (obstructive sleep apnea)  G47.33    Controlled on CPAP 6 cm water pressure    Orders Placed This Encounter  Procedures  . NM PET Image Initial (PI) Skull Base To Thigh    Standing Status:   Future    Standing Expiration Date:   09/20/2021    Scheduling Instructions:     Next available.    Order Specific Question:   If indicated for the ordered procedure, I authorize the administration of a radiopharmaceutical per Radiology protocol    Answer:   Yes    Order Specific Question:   Preferred imaging location?    Answer:   [place]     Patient has been scheduled for robotic assisted navigational bronchoscopy 30 Sep 2020 at 12:30 PM.  We will also order PET/CT.  Benefits, limitations and potential complications of the procedure were discussed with the patient/family  including, but not limited to bleeding, hemoptysis, respiratory failure  requiring intubation and/or prolongued mechanical ventilation, infection, pneumothorax (collapse of lung) requiring chest tube placement, stroke or even death.  Renold Don, MD [facility]   *This note was dictated using voice recognition software/Dragon.  Despite best efforts to proofread, errors can occur which can change the meaning.  Any change was purely unintentional.

## 2020-09-20 NOTE — Telephone Encounter (Signed)
Patient is scheduled for robotic ENB on 09/30/2020 at 12:30.  CPT:[id number] GP:QDIY mass.   Rodena Piety, please see bronch info

## 2020-09-20 NOTE — Progress Notes (Signed)
Subjective:    Patient ID: Natasha [name], female    DOB: Jan 03, 1941, 80 y.o.   MRN: [id number]  Chief Complaint  Patient presents with  . pulmonary consult    Per Waunita Schooner, MD-- recent CT. C/O chronic cough and sob with exertion.    HPI This is an 80 year old remote former smoker (24-pack-year history) who presents for evaluation of left upper lobe mass, she is kindly referred by Waunita Schooner, MD.  The patient has issues and underwent an MRI of the thoracic and lumbar spine on 28 August 2020 this was done at [facility].  In the thoracic portion of the study an ill-defined mass was noted on the left upper lobe and a CT of the chest was recommended.  The patient underwent CT chest on 04 September 2020.  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  There is no mediastinal adenopathy noted there is possible right axillary lymph node involvement.  The patient has had no fevers, chills or sweats.  No cough or sputum production no hemoptysis.  No weight loss or anorexia.  She has not any chest pain.  She does have issues with chronic back pain for which she takes as needed Dilaudid but she states that usually resting takes care of her pain and tries to avoid taking any medications for it.  She does have a history of paroxysmal atrial fibrillation for which she takes Eliquis.  She did have to stop Eliquis for 3 days were reek last injection and had no difficulty with this.  Her regular cardiologist is Dr. Kate Sable.  Patient has had sleep apnea for many years.  She is on CPAP at 6 cm of water pressure.  She is concerned as she was part of the recall of CPAP machines ([employer]) and one of the issues was potential carcinogenic substances on these machines.  She is now utilizing her new machine which he received approximately 2 to 3 weeks ago.  We discussed the potential modes of biopsy for this lesion.  The patient opts  navigational procedure.  This will be done with robotic assisted navigational protocol.  She understands that this will be done under general anesthesia.   Review of Systems A 10 point review of systems was performed and it is as noted above otherwise negative.  Past Medical History:  Diagnosis Date  . A-fib ([place])   . Chronic cough   . Diabetes mellitus without complication ([place])   . Diverticulitis   . High cholesterol   . Hypertension   . Murmur   . Restless leg   . Sleep apnea    Past Surgical History:  Procedure Laterality Date  . ABDOMINAL HYSTERECTOMY  1978  . APPENDECTOMY  1978  . BREAST BIOPSY Left ?   papilloma  . BREAST CYST EXCISION Bilateral yrs ago   benign, scars not well visualized  . BREAST SURGERY    . CHOLECYSTECTOMY    . ELBOW SURGERY    . FRACTURE SURGERY    . 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 KNEE SURGERY    . TOTAL SHOULDER REPLACEMENT    . WRIST SURGERY     Family History  Problem Relation Age of Onset  . Other Mother        died from surgery  . AAA (abdominal aortic aneurysm) Mother   . Diabetes Father        controlled by diet  .  Dementia Father        brain atrophy - unknown origin  . Breast cancer Cousin        maternal   Social History   Tobacco Use  . Smoking status: Former Smoker    Packs/day: 2.00    Years: 12.00    Pack years: 24.00    Types: Cigarettes    Quit date: 05/26/1975    Years since quitting: 45.3  . Smokeless tobacco: Never Used  Substance Use Topics  . Alcohol use: No   Allergies  Allergen Reactions  . Atorvastatin     Muscle/joint aches  . Lantus [Insulin Glargine] Hives  . Lisinopril Hives    Cough     Current Meds  Medication Sig  . acetaminophen (TYLENOL) 500 MG tablet Take 1,500 mg by mouth every 6 (six) hours as needed for mild pain.  . calcium carbonate (OS-CAL - DOSED IN MG OF ELEMENTAL CALCIUM) 1250 (500 Ca) MG tablet Take 1 tablet by mouth 2 (two) times daily with a meal.  . ELIQUIS 5 MG TABS  tablet TAKE 1 TABLET BY MOUTH TWICE A DAY  . glucose blood ([employer] BLOOD GLUCOSE TEST) test strip Dx E 11.49  . HYDROmorphone (DILAUDID) 2 MG tablet Take by mouth as needed.   Marland Kitchen ibuprofen (ADVIL) 200 MG tablet Take 600 mg by mouth as needed.  Marland Kitchen JANUMET 50-1000 MG tablet Take 1 tablet by mouth daily at 2 am.  . Multiple Vitamin (MULTIVITAMIN WITH MINERALS) TABS tablet Take 1 tablet by mouth daily.  Marland Kitchen oxyCODONE (ROXICODONE) 5 MG immediate release tablet Take 1 tablet (5 mg total) by mouth every 6 (six) hours as needed for severe pain.  . pramipexole (MIRAPEX) 1 MG tablet Take 1 tablet (1 mg total) by mouth at bedtime.  . rosuvastatin (CRESTOR) 20 MG tablet Take 1 tablet (20 mg total) by mouth at bedtime.  . sertraline (ZOLOFT) 50 MG tablet Take 1 tablet (50 mg total) by mouth at bedtime.  . TRESIBA FLEXTOUCH 100 UNIT/ML SOPN FlexTouch Pen Inject 10 Units into the skin at bedtime.   Marland Kitchen VITAMIN E PO Take 1 tablet by mouth 2 (two) times daily.   Immunization History  Administered Date(s) Administered  . Influenza Split 03/20/2014  . Influenza, High Dose Seasonal PF 05/08/2020  . Influenza,inj,Quad PF,6+ Mos 03/13/2017, 03/17/2018  . Influenza-Unspecified 03/17/2012, 03/23/2013, 03/22/2014, 03/22/2015, 03/25/2016, 02/23/2019  . Moderna SARS-COV2 Booster Vaccination 07/26/2019  . Moderna Sars-Covid-2 Vaccination 06/28/2019, 05/14/2020  . Pneumococcal Conjugate-13 10/25/2006, 05/11/2014  . Pneumococcal Polysaccharide-23 10/31/2008, 04/06/2012        Objective:   Physical Exam BP 138/82 (BP Location: Left Arm, Cuff Size: Normal)   Pulse 67   Temp 98.3 F (36.8 C) (Temporal)   Ht $R'5\' 4"'vm$  (1.626 m)   Wt 150 lb 6.4 oz (68.2 kg)   SpO2 99%   BMI 25.82 kg/m   GENERAL: Awake, alert, quite spry, fully ambulatory.  No acute distress.  No conversational dyspnea. HEAD: Normocephalic, atraumatic.  EYES: Pupils equal, round, reactive to light.  No scleral icterus.  MOUTH: Nose/mouth/throat not  examined due to masking requirements for COVID 19. NECK: Supple. No thyromegaly. Trachea midline. No JVD.  No adenopathy. PULMONARY: Good air entry bilaterally.  No adventitious sounds. CARDIOVASCULAR: S1 and S2. Regular rate and rhythm.  Grade 1/6 to 2/6 systolic ejection murmur left sternal border. ABDOMEN: Benign. MUSCULOSKELETAL: No joint deformity, no clubbing, no edema.  NEUROLOGIC: No focal deficit, no gait disturbance, speech is fluent. SKIN:  Intact,warm,dry.  No rashes. PSYCH: Mood and behavior normal.  Representative slices of CT scan performed 04 September 2020, independently reviewed shows mass on the left upper lobe:       Assessment & Plan:      ICD-10-CM   1. Mass of upper lobe of left lung  R91.8    Patient will need biopsy Biopsy will be done with robotic navigational assistance Benefits, limitations and complications discussed  2. Paroxysmal atrial fibrillation (HCC)  I48.0    On Eliquis Will need to hold 3 days prior to procedure  3. Pulmonary hypertension (HCC) mild  I27.20    Asymptomatic in this regard  4. OSA (obstructive sleep apnea)  G47.33    Controlled on CPAP 6 cm water pressure    Orders Placed This Encounter  Procedures  . NM PET Image Initial (PI) Skull Base To Thigh    Standing Status:   Future    Standing Expiration Date:   09/20/2021    Scheduling Instructions:     Next available.    Order Specific Question:   If indicated for the ordered procedure, I authorize the administration of a radiopharmaceutical per Radiology protocol    Answer:   Yes    Order Specific Question:   Preferred imaging location?    Answer:   [place]     Patient has been scheduled for robotic assisted navigational bronchoscopy 30 Sep 2020 at 12:30 PM.  We will also order PET/CT.  Benefits, limitations and potential complications of the procedure were discussed with the patient/family  including, but not limited to bleeding, hemoptysis, respiratory failure  requiring intubation and/or prolongued mechanical ventilation, infection, pneumothorax (collapse of lung) requiring chest tube placement, stroke or even death.  Renold Don, MD [place] [facility]   *This note was dictated using voice recognition software/Dragon.  Despite best efforts to proofread, errors can occur which can change the meaning.  Any change was purely unintentional.

## 2020-09-20 NOTE — Patient Instructions (Addendum)
We are scheduling you for a biopsy of the left lung to identify the normality.  This has been planned for 9 May at 1230 Hrs.  You will be receiving more instructions about COVID testing and preoperative testing prior to the date of the procedure.  You will also get final instructions on when to arrive etc. for that day.  We will see you in follow-up in 4 to 6 weeks, but we will stay in contact with you throughout the postoperative period.

## 2020-09-23 NOTE — Telephone Encounter (Signed)
Noted.  Will close encounter.  

## 2020-09-23 NOTE — Telephone Encounter (Signed)
No PA required patient has Medicare and AARP supplement

## 2020-09-24 ENCOUNTER — Encounter
Admission: RE | Admit: 2020-09-24 | Discharge: 2020-09-24 | Disposition: A | Discharge status: Home / Self Care | Payer: Medicare Other | Source: Ambulatory Visit | Attending: Pulmonary Disease | Admitting: Pulmonary Disease

## 2020-09-24 ENCOUNTER — Telehealth: Payer: Self-pay | Admitting: *Deleted

## 2020-09-24 ENCOUNTER — Other Ambulatory Visit: Payer: Self-pay

## 2020-09-24 HISTORY — DX: Unspecified asthma, uncomplicated: J45.909

## 2020-09-24 HISTORY — DX: Anemia, unspecified: D64.9

## 2020-09-24 HISTORY — DX: Personal history of other diseases of the digestive system: Z87.19

## 2020-09-24 NOTE — Patient Instructions (Addendum)
Your procedure is scheduled on: Monday, May 9 Report to the Registration Desk on the 1st floor of the [facility]. To find out your arrival time, please call [phone] between 1PM - 3PM on: Friday, May 6  REMEMBER: Instructions that are not followed completely may result in serious medical risk, up to and including death; or upon the discretion of your surgeon and anesthesiologist your surgery may need to be rescheduled.  Do not eat or drink anything after midnight the night before surgery.  No gum chewing, lozengers or hard candies.  DO NOT TAKE ANY MEDICATIONS THE MORNING OF SURGERY   Stop Janumet 2 days prior to surgery. Last day to take is Friday, May 6; resume AFTER surgery.  Take 1/2 of usual insulin dose the night before surgery and none on the morning of surgery. Only take 5 units of Tresiba the night before surgery.  Follow recommendations from Cardiologist, Pulmonologist or PCP regarding stopping Eliquis. Per Dr. Domingo Dimes note: stop 3 days prior to surgery. Last day to take Eliquis is Thursday, May 5; resume AFTER surgery per surgeon's instructions.  One week prior to surgery: starting today, April 3 Stop Anti-inflammatories (NSAIDS) such as Advil, Aleve, Ibuprofen, Motrin, Naproxen, Naprosyn and Aspirin based products such as Excedrin, Goodys Powder, BC Powder. Stop ANY OVER THE COUNTER supplements until after surgery. (Calcium, multi-vitamin, vitamin E)  No Alcohol for 24 hours before or after surgery.  No Smoking including e-cigarettes for 24 hours prior to surgery.   Do not use any "recreational" drugs for at least a week prior to your surgery.  Please be advised that the combination of cocaine and anesthesia may have negative outcomes, up to and including death. If you test positive for cocaine, your surgery will be cancelled.  On the morning of surgery brush your teeth with toothpaste and water, you may rinse your mouth with mouthwash if you wish. Do not swallow  any toothpaste or mouthwash.  Do not wear jewelry, make-up, hairpins, clips or nail polish.  Do not wear lotions, powders, or perfumes.   Contact lenses, hearing aids and dentures may not be worn into surgery.  Do not bring valuables to the hospital. [facility] is not responsible for any missing/lost belongings or valuables.   Bring your C-PAP to the hospital with you in case you may have to spend the night.   Notify your doctor if there is any change in your medical condition (cold, fever, infection).  Wear comfortable clothing (specific to your surgery type) to the hospital.  If you are being discharged the day of surgery, you will not be allowed to drive home. You will need a responsible adult (18 years or older) to drive you home and stay with you that night.   If you are taking public transportation, you will need to have a responsible adult (18 years or older) with you. Please confirm with your physician that it is acceptable to use public transportation.   Please call the [facility] at [phone] if you have any questions about these instructions.  Surgery Visitation Policy:  Patients undergoing a surgery or procedure may have one family member or support person with them as long as that person is not COVID-19 positive or experiencing its symptoms.  That person may remain in the waiting area during the procedure.

## 2020-09-24 NOTE — Telephone Encounter (Signed)
Can you please provide recommendations for eliquis?

## 2020-09-24 NOTE — Telephone Encounter (Signed)
FW: Request for pre-operative cardiac clearance Received: Today Precious Gilding, RN  Michae Kava, CMA      Previous Messages   ----- Message -----  From: [name], NP  Sent: 09/24/2020  4:30 PM EDT  To: [facility]  Subject: Request for pre-operative cardiac clearance    Request for pre-operative cardiac clearance:    1. What type of surgery is being performed?  ROBOTIC ASSISTED VIDEO BRONCHOSCOPY WITH ENDOBRONCHIAL NAVIGATION   2. When is this surgery scheduled?  09/30/2020    3. Are there any medications that need to be held prior to surgery?  APIXABAN x 5 days (last dose 09/24/2020)   4. Practice name and name of physician performing surgery?  Performing surgeon: Dr. Leola Brazil, MD  Requesting clearance: Honor Loh, FNP-C     5. Anesthesia type (none, local, MAC, general)? General   6. What is the office phone and fax number?   Phone: [phone]  Fax: [phone]   ATTENTION: Unable to create telephone message as per your standard workflow. Directed by [facility] providers to send requests for cardiac clearance to this pool for appropriate distribution to provider covering pre-operative clearances.   Honor Loh, MSN, APRN, FNP-C, CEN  [facility]  Peri-operative Services Nurse Practitioner  Phone: [phone]  09/24/20 4:28 PM

## 2020-09-25 ENCOUNTER — Encounter: Payer: Self-pay | Admitting: Pulmonary Disease

## 2020-09-25 NOTE — Progress Notes (Signed)
Perioperative Services  Pre-Admission/Anesthesia Testing Clinical Review  Date: 09/25/20  Patient Demographics:  Name: Natasha [name] DOB:   17-Nov-1940 MRN:   [id number]  Planned Surgical Procedure(s):    Case: [id number] Date/Time: 09/30/20 1230   Procedure: ROBOTIC ASSISTED VIDEO BRONCHOSCOPY WITH ENDOBRONCHIAL NAVIGATION (N/A )   Anesthesia type: General   Pre-op diagnosis: LUNG MASS   Location: [facility] / [facility]   Surgeons: Tyler Pita, MD    NOTE: Available PAT nursing documentation and vital signs have been reviewed. Clinical nursing staff has updated patient's PMH/PSHx, current medication list, and drug allergies/intolerances to ensure comprehensive history available to assist in medical decision making as it pertains to the aforementioned surgical procedure and anticipated anesthetic course.   Clinical Discussion:  Natasha [name] is a 80 y.o. female who is submitted for pre-surgical anesthesia review and clearance prior to her undergoing the above procedure.Patient is a Former Smoker (24 pack years; quit 05/1975). Pertinent PMH includes: atrial fibrillation, mild aortic valve stenosis, cardiac murmur, aortic atherosclerosis, HTN, HLD, T2DM, CKD-III, asthma, OSAH (requires nocturnal PAP therapy), anemia, insomnia   Patient is followed by cardiology Garen Lah, MD). She was last seen in the cardiology clinic on 06/24/2020; notes reviewed.  At the time of her clinic visit, patient doing well overall from a cardiovascular perspective.  She denies any chest pain, shortness of breath, PND, orthopnea, peripheral edema, vertiginous symptoms, or presyncope/syncope.  Patient with rare palpitations in the setting of known atrial fibrillation. CHA2DS2-VASc Score = 5 (age x 2, sex,T2DM, HTN). Patient chronically anticoagulated using apixaban; compliant with therapy with no evidence of GI bleeding. Last TTE performed 12/21/2019 revealed normal left ventricular  systolic function with mild to moderate valvular insufficiency; LVEF 60-65%.  There was no evidence of aortic stenosis (see full interpretation of cardiovascular testing below).  Blood pressure well controlled at 124/70; no current medications. Patient is on a statin for her HLD. T2DM uncontrolled on currently prescribed regimen; Hgb A1c 9.2% when last checked on 07/18/2020. Functional capacity, as defined by [facility], is documented as being >/= 4 METS.  No changes were made to patient's medication regimen.  Patient to follow-up with outpatient cardiology in 6 months or sooner if needed.  Patient is scheduled for an ENB/EBUS on 09/30/2020 with Dr. Vernard Gambles for further evaluation of recently identified spiculated pulmonary mass in patient's LEFT lung.  PET imaging has been ordered; study pending. Given patient's past medical history significant for cardiovascular diagnoses, presurgical cardiac clearance was sought by the performing surgeon's office and PAT team. Per cardiology, "based on patient's past medical history and time since his last clinic visit, patient would be at an overall ACCEPTABLE risk for the planned procedure without further cardiovascular testing or intervention at this time".  This patient is on daily anticoagulation therapy. She has been instructed on recommendations for holding her apixaban for 3 days prior to her procedure with plans to restart as soon as postoperative bleeding risk felt to be minimized by her attending surgeon. The patient has been instructed that her last dose of her anticoagulant will be on 09/26/2020.  Patient denies previous perioperative complications with anesthesia in the past. In review of the available records, it is noted that patient underwent a general anesthetic course at [facility] in [place] (ASA III) in 11/2017 without documented complications.   Vitals with BMI 09/24/2020 09/20/2020 07/22/2020  Height $Remov'5\' 4"'uIGoXO$  $Remove'5\' 4"'OqnOijA$  $RemoveB'5\' 4"'giWhpLQA$   Weight 150 lbs 150 lbs 6 oz  151 lbs  12 oz  BMI 25.73 33.5 45.62  Systolic - 563 893  Diastolic - 82 80  Pulse - 67 87    Providers/Specialists:   NOTE: Primary physician provider listed below. Patient may have been seen by APP or partner within same practice.   PROVIDER ROLE / SPECIALTY LAST Sherrian Divers, MD  Pulmonary Medicine  09/20/2020  Lesleigh Noe, MD  Primary Care Provider  07/22/2020  Kate Sable, MD  Cardiology  06/24/2020   Allergies:  Atorvastatin, Lantus [insulin glargine], and Lisinopril  Current Home Medications:   No current facility-administered medications for this encounter.   . Calcium Carb-Cholecalciferol (CALCIUM 600+D3 PO)  . ELIQUIS 5 MG TABS tablet  . Ferrous Gluconate (IRON 27 PO)  . JANUMET 50-1000 MG tablet  . Multiple Vitamin (MULTIVITAMIN WITH MINERALS) TABS tablet  . pramipexole (MIRAPEX) 1 MG tablet  . rosuvastatin (CRESTOR) 20 MG tablet  . sertraline (ZOLOFT) 50 MG tablet  . TRESIBA FLEXTOUCH 100 UNIT/ML SOPN FlexTouch Pen  . vitamin E (VITAMIN E) 180 MG (400 UNITS) capsule   History:   Past Medical History:  Diagnosis Date  . A-fib ([place])   . Anemia   . Aortic atherosclerosis ([place])   . Aortic stenosis, mild   . Asthma    mild  . Chronic cough   . CKD (chronic kidney disease), stage III ([place])   . Diverticulitis   . High cholesterol   . History of hiatal hernia   . Hypertension   . Insomnia   . Murmur   . Restless leg   . Sleep apnea   . T2DM (type 2 diabetes mellitus) ([place])    Past Surgical History:  Procedure Laterality Date  . ABDOMINAL HYSTERECTOMY  1978  . APPENDECTOMY  1978  . BREAST BIOPSY Left ?   papilloma  . BREAST CYST EXCISION Bilateral yrs ago   benign, scars not well visualized  . BREAST SURGERY    . CATARACT EXTRACTION W/ INTRAOCULAR LENS  IMPLANT, BILATERAL Bilateral   . CHOLECYSTECTOMY    . COLONOSCOPY    . ELBOW SURGERY Right    [place] release  . EYE SURGERY    . FRACTURE SURGERY    . 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 JOINT REPLACEMENT    . KNEE SURGERY Left    tibial fracture with metal plate  . TOTAL SHOULDER REPLACEMENT Left 2014  . WRIST SURGERY Left    fractures   Family History  Problem Relation Age of Onset  . Other Mother        died from surgery  . AAA (abdominal aortic aneurysm) Mother   . Diabetes Father        controlled by diet  . Dementia Father        brain atrophy - unknown origin  . Breast cancer Cousin        maternal   Social History   Tobacco Use  . Smoking status: Former Smoker    Packs/day: 2.00    Years: 12.00    Pack years: 24.00    Types: Cigarettes    Quit date: 05/26/1975    Years since quitting: 45.3  . Smokeless tobacco: Never Used  Vaping Use  . Vaping Use: Never used  Substance Use Topics  . Alcohol use: No    Comment: rarely  . Drug use: Never    Pertinent Clinical Results:  LABS: Labs reviewed: Acceptable for surgery.  No visits with results within 3 Day(s) from  this visit.  Latest known visit with results is:  Office Visit on 07/22/2020  Component Date Value Ref Range Status  . Sodium 07/22/2020 137  135 - 145 mEq/L Final  . Potassium 07/22/2020 4.6  3.5 - 5.1 mEq/L Final  . Chloride 07/22/2020 104  96 - 112 mEq/L Final  . CO2 07/22/2020 24  19 - 32 mEq/L Final  . Glucose, Bld 07/22/2020 270* 70 - 99 mg/dL Final  . BUN 07/22/2020 23  6 - 23 mg/dL Final  . Creatinine, Ser 07/22/2020 1.30* 0.40 - 1.20 mg/dL Final  . Total Bilirubin 07/22/2020 0.3  0.2 - 1.2 mg/dL Final  . Alkaline Phosphatase 07/22/2020 77  39 - 117 U/L Final  . AST 07/22/2020 44* 0 - 37 U/L Final  . ALT 07/22/2020 30  0 - 35 U/L Final  . Total Protein 07/22/2020 6.1  6.0 - 8.3 g/dL Final  . Albumin 07/22/2020 4.1  3.5 - 5.2 g/dL Final  . GFR 07/22/2020 38.96* >60.00 mL/min Final   Calculated using the CKD-EPI Creatinine Equation (2021)  . Calcium 07/22/2020 9.3  8.4 - 10.5 mg/dL Final  . WBC 07/22/2020 9.2  4.0 - 10.5 K/uL Final  . RBC 07/22/2020 3.79* 3.87  - 5.11 Mil/uL Final  . Platelets 07/22/2020 240.0  150.0 - 400.0 K/uL Final  . Hemoglobin 07/22/2020 11.5* 12.0 - 15.0 g/dL Final  . HCT 07/22/2020 33.8* 36.0 - 46.0 % Final  . MCV 07/22/2020 89.4  78.0 - 100.0 fl Final  . MCHC 07/22/2020 33.9  30.0 - 36.0 g/dL Final  . RDW 07/22/2020 13.4  11.5 - 15.5 % Final  . Ferritin 07/22/2020 21.2  10.0 - 291.0 ng/mL Final  . Cholesterol 07/22/2020 132  0 - 200 mg/dL Final   ATP III Classification       Desirable:  < 200 mg/dL               Borderline High:  200 - 239 mg/dL          High:  > = 240 mg/dL  . Triglycerides 07/22/2020 214.0* 0.0 - 149.0 mg/dL Final   Normal:  <150 mg/dLBorderline High:  150 - 199 mg/dL  . HDL 07/22/2020 46.20  >39.00 mg/dL Final  . VLDL 07/22/2020 42.8* 0.0 - 40.0 mg/dL Final  . Total CHOL/HDL Ratio 07/22/2020 3   Final                  Men          Women1/2 Average Risk     3.4          3.3Average Risk          5.0          4.42X Average Risk          9.6          7.13X Average Risk          15.0          11.0                      . NonHDL 07/22/2020 85.77   Final   NOTE:  Non-HDL goal should be 30 mg/dL higher than patient's LDL goal (i.e. LDL goal of < 70 mg/dL, would have non-HDL goal of < 100 mg/dL)  . Direct LDL 07/22/2020 56.0  mg/dL Final   Optimal:  <100 mg/dLNear or Above Optimal:  100-129 mg/dLBorderline High:  130-159 mg/dLHigh:  160-189  mg/dLVery High:  >190 mg/dL    ECG: Date: 06/24/2020 Time ECG obtained: 1418 PM Rate: 71 bpm Rhythm: normal sinus Axis (leads I and aVF): Normal Intervals: PR 154 ms. QRS 88 ms. QTc 425 ms. ST segment and T wave changes: No evidence of acute ST segment elevation or depression Comparison: Similar to previous tracing obtained on 11/20/2019   IMAGING / PROCEDURES: CT CHEST WITHOUT CONTRAST performed on 09/04/2020 1. 2.1 cm spiculated density is noted in the left upper lobe highly concerning for malignancy.  Further evaluation with PET scan is recommended. 2. 1.3 cm  right axillary lymph node is noted.  This may be reactive in etiology, but metastatic disease cannot be excluded 3. Aortic atherosclerosis  ECHOCARDIOGRAM performed on 12/21/2019 1. Left ventricular ejection fraction, by estimation, is 60 to 65%.  2. The left ventricle has normal function.  3. The left ventricle has no regional wall motion abnormalities.  4. Left ventricular diastolic parameters are consistent with Grade I diastolic dysfunction (impaired relaxation).  5. Right ventricular systolic function is normal.  6. The right ventricular size is normal.  7. There is mildly elevated pulmonary artery systolic pressure. The estimated right ventricular systolic pressure is 85.8 mmHg.  8. Left atrial size was mildly dilated.  9. Mild to moderate mitral valve regurgitation.  10. Mild to moderate aortic valve sclerosis/calcification is present, without any evidence of aortic stenosis.   Impression and Plan:  Natasha [name] has been referred for pre-anesthesia review and clearance prior to her undergoing the planned anesthetic and procedural courses. Available labs, pertinent testing, and imaging results were personally reviewed by me. This patient has been appropriately cleared by cardiology with an overall ACCEPTABLE risk of significant perioperative cardiovascular complications.  Based on clinical review performed today (09/25/20), barring any significant acute changes in the patient's overall condition, it is anticipated that she will be able to proceed with the planned surgical intervention. Any acute changes in clinical condition may necessitate her procedure being postponed and/or cancelled. Patient will meet with anesthesia team (MD and/or CRNA) on the day of her procedure for preoperative evaluation/assessment. Questions regarding anesthetic course will be fielded at that time.   Pre-surgical instructions were reviewed with the patient during her PAT appointment and questions were fielded by  PAT clinical staff. Patient was advised that if any questions or concerns arise prior to her procedure then she should return a call to PAT and/or her surgeon's office to discuss.  Honor Loh, MSN, APRN, FNP-C, CEN [facility]  Peri-operative Services Nurse Practitioner Phone: [phone] 09/25/20 12:12 PM  NOTE: This note has been prepared using Dragon dictation software. Despite my best ability to proofread, there is always the potential that unintentional transcriptional errors may still occur from this process.

## 2020-09-25 NOTE — Telephone Encounter (Signed)
Patient with diagnosis of afib on Eliquis for anticoagulation.    Procedure: ROBOTIC ASSISTED VIDEO BRONCHOSCOPY WITH ENDOBRONCHIAL NAVIGATION  Date of procedure: 09/30/2020  CHA2DS2-VASc Score = 5  This indicates a 7.2% annual risk of stroke. The patient's score is based upon: CHF History: No HTN History: Yes Diabetes History: Yes Stroke History: No Vascular Disease History: No Age Score: 2 Gender Score: 1     CrCl 29.8 ml/min Platelet count 240  Per office protocol, patient can hold Eliquis for 3 days prior to procedure.    There is no need to hold Eliquis 5 days as the medications will be completely cleared after 3 days. If MD still wants a 5 day hold, then provider input will be needed.

## 2020-09-25 NOTE — Telephone Encounter (Signed)
   Name: Natasha [name]  DOB: [birth year]  MRN: [id number]   Primary Cardiologist: Kate Sable, MD  Chart reviewed as part of pre-operative protocol coverage. We have been asked to provide guidance to hold eliquis. Per our clinical pharmacist:  Patient with diagnosis of afib on Eliquis for anticoagulation.    Procedure: ROBOTIC ASSISTED VIDEO BRONCHOSCOPY WITH ENDOBRONCHIAL NAVIGATION  Date of procedure: 09/30/2020  CHA2DS2-VASc Score = 5  This indicates a 7.2% annual risk of stroke. The patient's score is based upon: CHF History: No HTN History: Yes Diabetes History: Yes Stroke History: No Vascular Disease History: No Age Score: 2 Gender Score: 1     CrCl 29.8 ml/min Platelet count 240  Per office protocol, patient can hold Eliquis for 3 days prior to procedure.    There is no need to hold Eliquis 5 days as the medications will be completely cleared after 3 days. If MD still wants a 5 day hold, then provider input will be needed.  She is able to complete 4.0 METS without angina. She does not have a history of CAD, MI, or stroke. She is cleared for surgery without further cardiac workup.   I will route this recommendation to the requesting party via Epic fax function and remove from pre-op pool. Please call with questions.  Tami Lin Barbaraann Avans, PA 09/25/2020, 8:00 AM

## 2020-09-26 ENCOUNTER — Other Ambulatory Visit: Payer: Self-pay

## 2020-09-26 ENCOUNTER — Ambulatory Visit
Admission: RE | Admit: 2020-09-26 | Discharge: 2020-09-26 | Disposition: A | Discharge status: Home / Self Care | Payer: Medicare Other | Source: Ambulatory Visit | Attending: Pulmonary Disease | Admitting: Pulmonary Disease

## 2020-09-26 ENCOUNTER — Other Ambulatory Visit
Admission: RE | Admit: 2020-09-26 | Discharge: 2020-09-26 | Disposition: A | Discharge status: Home / Self Care | Payer: Medicare Other | Source: Ambulatory Visit | Attending: Pulmonary Disease | Admitting: Pulmonary Disease

## 2020-09-26 DIAGNOSIS — Z01812 Encounter for preprocedural laboratory examination: Secondary | ICD-10-CM | POA: Diagnosis not present

## 2020-09-26 DIAGNOSIS — Z20822 Contact with and (suspected) exposure to covid-19: Secondary | ICD-10-CM | POA: Diagnosis not present

## 2020-09-26 DIAGNOSIS — R918 Other nonspecific abnormal finding of lung field: Secondary | ICD-10-CM | POA: Diagnosis not present

## 2020-09-27 LAB — SARS CORONAVIRUS 2 (TAT 6-24 HRS): SARS Coronavirus 2: NEGATIVE

## 2020-09-30 ENCOUNTER — Ambulatory Visit: Payer: Medicare Other

## 2020-09-30 ENCOUNTER — Encounter: Payer: Self-pay | Admitting: Pulmonary Disease

## 2020-09-30 ENCOUNTER — Ambulatory Visit: Payer: Medicare Other | Admitting: Urgent Care

## 2020-09-30 ENCOUNTER — Ambulatory Visit
Admission: RE | Admit: 2020-09-30 | Discharge: 2020-09-30 | Disposition: A | Discharge status: Home / Self Care | Payer: Medicare Other | Attending: Pulmonary Disease | Admitting: Pulmonary Disease

## 2020-09-30 ENCOUNTER — Encounter
Admission: RE | Disposition: A | Discharge status: Home / Self Care | Payer: Self-pay | Source: Home / Self Care | Attending: Pulmonary Disease

## 2020-09-30 ENCOUNTER — Other Ambulatory Visit: Payer: Self-pay

## 2020-09-30 DIAGNOSIS — I700 Atherosclerosis of aorta: Secondary | ICD-10-CM | POA: Insufficient documentation

## 2020-09-30 DIAGNOSIS — Z79899 Other long term (current) drug therapy: Secondary | ICD-10-CM | POA: Diagnosis not present

## 2020-09-30 DIAGNOSIS — C3492 Malignant neoplasm of unspecified part of left bronchus or lung: Secondary | ICD-10-CM

## 2020-09-30 DIAGNOSIS — Z7984 Long term (current) use of oral hypoglycemic drugs: Secondary | ICD-10-CM | POA: Insufficient documentation

## 2020-09-30 DIAGNOSIS — Z7901 Long term (current) use of anticoagulants: Secondary | ICD-10-CM | POA: Diagnosis not present

## 2020-09-30 DIAGNOSIS — R011 Cardiac murmur, unspecified: Secondary | ICD-10-CM | POA: Diagnosis not present

## 2020-09-30 DIAGNOSIS — Z9889 Other specified postprocedural states: Secondary | ICD-10-CM

## 2020-09-30 DIAGNOSIS — I350 Nonrheumatic aortic (valve) stenosis: Secondary | ICD-10-CM | POA: Diagnosis not present

## 2020-09-30 DIAGNOSIS — N183 Chronic kidney disease, stage 3 unspecified: Secondary | ICD-10-CM | POA: Insufficient documentation

## 2020-09-30 DIAGNOSIS — Z833 Family history of diabetes mellitus: Secondary | ICD-10-CM | POA: Insufficient documentation

## 2020-09-30 DIAGNOSIS — Z803 Family history of malignant neoplasm of breast: Secondary | ICD-10-CM | POA: Insufficient documentation

## 2020-09-30 DIAGNOSIS — Z8249 Family history of ischemic heart disease and other diseases of the circulatory system: Secondary | ICD-10-CM | POA: Diagnosis not present

## 2020-09-30 DIAGNOSIS — I129 Hypertensive chronic kidney disease with stage 1 through stage 4 chronic kidney disease, or unspecified chronic kidney disease: Secondary | ICD-10-CM | POA: Insufficient documentation

## 2020-09-30 DIAGNOSIS — Z96619 Presence of unspecified artificial shoulder joint: Secondary | ICD-10-CM | POA: Diagnosis not present

## 2020-09-30 DIAGNOSIS — E785 Hyperlipidemia, unspecified: Secondary | ICD-10-CM | POA: Insufficient documentation

## 2020-09-30 DIAGNOSIS — C3412 Malignant neoplasm of upper lobe, left bronchus or lung: Secondary | ICD-10-CM | POA: Diagnosis not present

## 2020-09-30 DIAGNOSIS — I272 Pulmonary hypertension, unspecified: Secondary | ICD-10-CM | POA: Diagnosis not present

## 2020-09-30 DIAGNOSIS — Z888 Allergy status to other drugs, medicaments and biological substances status: Secondary | ICD-10-CM | POA: Insufficient documentation

## 2020-09-30 DIAGNOSIS — G4733 Obstructive sleep apnea (adult) (pediatric): Secondary | ICD-10-CM | POA: Diagnosis not present

## 2020-09-30 DIAGNOSIS — Z87891 Personal history of nicotine dependence: Secondary | ICD-10-CM | POA: Insufficient documentation

## 2020-09-30 DIAGNOSIS — E1122 Type 2 diabetes mellitus with diabetic chronic kidney disease: Secondary | ICD-10-CM | POA: Diagnosis not present

## 2020-09-30 DIAGNOSIS — M549 Dorsalgia, unspecified: Secondary | ICD-10-CM | POA: Insufficient documentation

## 2020-09-30 DIAGNOSIS — R918 Other nonspecific abnormal finding of lung field: Secondary | ICD-10-CM

## 2020-09-30 DIAGNOSIS — I480 Paroxysmal atrial fibrillation: Secondary | ICD-10-CM | POA: Insufficient documentation

## 2020-09-30 HISTORY — DX: Insomnia, unspecified: G47.00

## 2020-09-30 HISTORY — PX: VIDEO BRONCHOSCOPY WITH ENDOBRONCHIAL NAVIGATION: SHX6175

## 2020-09-30 HISTORY — DX: Type 2 diabetes mellitus without complications: E11.9

## 2020-09-30 HISTORY — DX: Malignant neoplasm of unspecified part of left bronchus or lung: C34.92

## 2020-09-30 HISTORY — DX: Nonrheumatic aortic (valve) stenosis: I35.0

## 2020-09-30 HISTORY — DX: Atherosclerosis of aorta: I70.0

## 2020-09-30 HISTORY — DX: Chronic kidney disease, stage 3 unspecified: N18.30

## 2020-09-30 LAB — GLUCOSE, CAPILLARY
Glucose-Capillary: 106 mg/dL — ABNORMAL HIGH (ref 70–99)
Glucose-Capillary: 103 mg/dL — ABNORMAL HIGH (ref 70–99)

## 2020-09-30 SURGERY — VIDEO BRONCHOSCOPY WITH ENDOBRONCHIAL NAVIGATION
Anesthesia: General

## 2020-09-30 MED ORDER — SODIUM CHLORIDE 0.9 % IV SOLN: Freq: Once | INTRAVENOUS | Status: AC

## 2020-09-30 MED ORDER — CHLORHEXIDINE GLUCONATE 0.12 % MT SOLN
OROMUCOSAL | Status: AC
  Administered 2020-09-30: 15 mL via OROMUCOSAL
  Filled 2020-09-30: qty 15

## 2020-09-30 MED ORDER — PROPOFOL 10 MG/ML IV BOLUS
INTRAVENOUS | Status: DC | PRN
  Administered 2020-09-30: 150 mg via INTRAVENOUS

## 2020-09-30 MED ORDER — SUGAMMADEX SODIUM 200 MG/2ML IV SOLN
INTRAVENOUS | Status: DC | PRN
  Administered 2020-09-30: 200 mg via INTRAVENOUS

## 2020-09-30 MED ORDER — FAMOTIDINE 20 MG PO TABS: 20.0000 mg | ORAL_TABLET | Freq: Once | ORAL | Status: AC

## 2020-09-30 MED ORDER — MEPERIDINE HCL 50 MG/ML IJ SOLN: 6.2500 mg | INTRAMUSCULAR | Status: DC | PRN

## 2020-09-30 MED ORDER — FAMOTIDINE 20 MG PO TABS
ORAL_TABLET | ORAL | Status: AC
  Administered 2020-09-30: 20 mg via ORAL
  Filled 2020-09-30: qty 1

## 2020-09-30 MED ORDER — IPRATROPIUM-ALBUTEROL 0.5-2.5 (3) MG/3ML IN SOLN
RESPIRATORY_TRACT | Status: AC
  Administered 2020-09-30: 3 mL via RESPIRATORY_TRACT
  Filled 2020-09-30: qty 3

## 2020-09-30 MED ORDER — HYDROCOD POLST-CPM POLST ER 10-8 MG/5ML PO SUER
5.0000 mL | Freq: Once | ORAL | Status: DC
  Filled 2020-09-30: qty 5

## 2020-09-30 MED ORDER — IPRATROPIUM-ALBUTEROL 0.5-2.5 (3) MG/3ML IN SOLN: 3.0000 mL | Freq: Once | RESPIRATORY_TRACT | Status: AC

## 2020-09-30 MED ORDER — LIDOCAINE HCL (CARDIAC) PF 100 MG/5ML IV SOSY
PREFILLED_SYRINGE | INTRAVENOUS | Status: DC | PRN
  Administered 2020-09-30: 100 mg via INTRAVENOUS

## 2020-09-30 MED ORDER — FENTANYL CITRATE (PF) 100 MCG/2ML IJ SOLN
INTRAMUSCULAR | Status: AC
  Filled 2020-09-30: qty 2

## 2020-09-30 MED ORDER — FENTANYL CITRATE (PF) 100 MCG/2ML IJ SOLN
INTRAMUSCULAR | Status: DC | PRN
  Administered 2020-09-30 (×2): 50 ug via INTRAVENOUS

## 2020-09-30 MED ORDER — LACTATED RINGERS IV SOLN: INTRAVENOUS | Status: DC | PRN

## 2020-09-30 MED ORDER — ROCURONIUM BROMIDE 100 MG/10ML IV SOLN
INTRAVENOUS | Status: DC | PRN
  Administered 2020-09-30: 50 mg via INTRAVENOUS

## 2020-09-30 MED ORDER — PROPOFOL 10 MG/ML IV BOLUS
INTRAVENOUS | Status: AC
  Filled 2020-09-30: qty 20

## 2020-09-30 MED ORDER — DEXAMETHASONE SODIUM PHOSPHATE 10 MG/ML IJ SOLN
INTRAMUSCULAR | Status: AC
  Filled 2020-09-30: qty 4

## 2020-09-30 MED ORDER — ONDANSETRON HCL 4 MG/2ML IJ SOLN
INTRAMUSCULAR | Status: AC
  Filled 2020-09-30: qty 10

## 2020-09-30 MED ORDER — CHLORHEXIDINE GLUCONATE 0.12 % MT SOLN: 15.0000 mL | Freq: Once | OROMUCOSAL | Status: AC

## 2020-09-30 MED ORDER — ORAL CARE MOUTH RINSE: 15.0000 mL | Freq: Once | OROMUCOSAL | Status: AC

## 2020-09-30 MED ORDER — FENTANYL CITRATE (PF) 100 MCG/2ML IJ SOLN: 25.0000 ug | INTRAMUSCULAR | Status: DC | PRN

## 2020-09-30 MED ORDER — ONDANSETRON HCL 4 MG/2ML IJ SOLN
INTRAMUSCULAR | Status: DC | PRN
  Administered 2020-09-30: 4 mg via INTRAVENOUS

## 2020-09-30 MED ORDER — PHENYLEPHRINE HCL (PRESSORS) 10 MG/ML IV SOLN
INTRAVENOUS | Status: DC | PRN
  Administered 2020-09-30 (×3): 100 ug via INTRAVENOUS

## 2020-09-30 MED ORDER — DEXAMETHASONE SODIUM PHOSPHATE 10 MG/ML IJ SOLN
INTRAMUSCULAR | Status: DC | PRN
  Administered 2020-09-30: 10 mg via INTRAVENOUS

## 2020-09-30 MED ORDER — SODIUM CHLORIDE FLUSH 0.9 % IV SOLN
INTRAVENOUS | Status: AC
  Filled 2020-09-30: qty 10

## 2020-09-30 MED ORDER — ONDANSETRON HCL 4 MG/2ML IJ SOLN: 4.0000 mg | Freq: Once | INTRAMUSCULAR | Status: DC | PRN

## 2020-09-30 NOTE — Anesthesia Procedure Notes (Signed)
Procedure Name: Intubation Date/Time: 09/30/2020 1:01 PM Performed by: Hedda Slade, CRNA Pre-anesthesia Checklist: Patient identified, Patient being monitored, Timeout performed, Emergency Drugs available and Suction available Patient Re-evaluated:Patient Re-evaluated prior to induction Oxygen Delivery Method: Circle system utilized Preoxygenation: Pre-oxygenation with 100% oxygen Induction Type: IV induction Ventilation: Mask ventilation without difficulty and Oral airway inserted - appropriate to patient size Laryngoscope Size: 3 and McGraph Grade View: Grade I Tube type: Oral Tube size: 8.5 mm Number of attempts: 1 Airway Equipment and Method: Stylet and Video-laryngoscopy Placement Confirmation: ETT inserted through vocal cords under direct vision,  positive ETCO2 and breath sounds checked- equal and bilateral Secured at: 21 cm Tube secured with: Tape Dental Injury: Teeth and Oropharynx as per pre-operative assessment

## 2020-09-30 NOTE — Discharge Instructions (Signed)
You may start Eliquis in the morning 5/10.  The results of the biopsy should be back by no later than Friday.  We will be calling you with the results.   We will also call you with the results of your upcoming PET/CT.   AMBULATORY SURGERY  DISCHARGE INSTRUCTIONS   1) The drugs that you were given will stay in your system until tomorrow so for the next 24 hours you should not:  A) Drive an automobile B) Make any legal decisions C) Drink any alcoholic beverage   2) You may resume regular meals tomorrow.  Today it is better to start with liquids and gradually work up to solid foods.  You may eat anything you prefer, but it is better to start with liquids, then soup and crackers, and gradually work up to solid foods.   3) Please notify your doctor immediately if you have any unusual bleeding, trouble breathing, redness and pain at the surgery site, drainage, fever, or pain not relieved by medication.    4) Additional Instructions:        Please contact your physician with any problems or Same Day Surgery at [phone], Monday through Friday 6 am to 4 pm, or [place] at [facility] number at [phone].

## 2020-09-30 NOTE — Interval H&P Note (Signed)
History and Physical Interval Note:  09/30/2020 12:34 PM  Natasha [name]  has presented today for surgery, with the diagnosis of LUNG MASS.  The various methods of treatment have been discussed with the patient and family. After consideration of risks, benefits and other options for treatment, the patient has consented to  Procedure(s): ROBOTIC [place] (N/A) as a surgical intervention.  The patient's history has been reviewed, patient examined, no change in status, stable for surgery.  I have reviewed the patient's chart and labs.  Questions were answered to the patient's satisfaction.     Vernard Gambles

## 2020-09-30 NOTE — Anesthesia Preprocedure Evaluation (Signed)
Anesthesia Evaluation  Patient identified by MRN, date of birth, ID band Patient awake    Reviewed: Allergy & Precautions, NPO status , Patient's Chart, lab work & pertinent test results  Airway Mallampati: II  TM Distance: >3 FB Neck ROM: Full    Dental  (+) Teeth Intact   Pulmonary asthma , sleep apnea and Continuous Positive Airway Pressure Ventilation , former smoker,    Pulmonary exam normal        Cardiovascular hypertension, Pt. on medications + dysrhythmias Atrial Fibrillation + Valvular Problems/Murmurs  Rhythm:Irregular     Neuro/Psych  Neuromuscular disease negative psych ROS   GI/Hepatic Neg liver ROS, hiatal hernia, GERD  ,  Endo/Other  negative endocrine ROSdiabetes  Renal/GU Renal disease  negative genitourinary   Musculoskeletal  (+) Arthritis , Osteoarthritis,    Abdominal   Peds negative pediatric ROS (+)  Hematology negative hematology ROS (+) anemia ,   Anesthesia Other Findings  Mass of upper lobe of left lung  R91.8   Patient will need biopsy Biopsy will be done with robotic navigational assistance Benefits, limitations and complications discussed 2. Paroxysmal atrial fibrillation (HCC)  I48.0   On Eliquis Will need to hold 3 days prior to procedure 3. Pulmonary hypertension (HCC) mild  I27.20   Asymptomatic in this regard 4. OSA (obstructive sleep apnea)  G47.33   Controlled on CPAP 6 cm water pressure   Chronic Cough     Reproductive/Obstetrics negative OB ROS                             Anesthesia Physical Anesthesia Plan  ASA: III  Anesthesia Plan: General   Post-op Pain Management:    Induction: Intravenous  PONV Risk Score and Plan: 2  Airway Management Planned: Oral ETT  Additional Equipment:   Intra-op Plan:   Post-operative Plan:   Informed Consent: I have reviewed the patients History and Physical, chart, labs and discussed  the procedure including the risks, benefits and alternatives for the proposed anesthesia with the patient or authorized representative who has indicated his/her understanding and acceptance.     Dental advisory given  Plan Discussed with: CRNA, Anesthesiologist and Surgeon  Anesthesia Plan Comments:         Anesthesia Quick Evaluation

## 2020-09-30 NOTE — Anesthesia Postprocedure Evaluation (Signed)
Anesthesia Post Note  Patient: Willisha Sligar  Procedure(s) Performed: ROBOTIC ASSISTED VIDEO BRONCHOSCOPY WITH ENDOBRONCHIAL NAVIGATION (N/A )  Patient location during evaluation: PACU Anesthesia Type: General Level of consciousness: awake and alert, awake and oriented Pain management: pain level controlled Vital Signs Assessment: post-procedure vital signs reviewed and stable Respiratory status: spontaneous breathing, nonlabored ventilation and respiratory function stable Cardiovascular status: blood pressure returned to baseline and stable Postop Assessment: no apparent nausea or vomiting Anesthetic complications: no   No complications documented.   Last Vitals:  Vitals:   09/30/20 1453 09/30/20 1505  BP: (!) 143/90 138/77  Pulse: 63 67  Resp: 16 16  Temp: (!) 36.4 C 36.8 C  SpO2: 100% 97%    Last Pain:  Vitals:   09/30/20 1505  TempSrc: Temporal  PainSc: 0-No pain                 Phill Mutter

## 2020-09-30 NOTE — Op Note (Signed)
Robotic assisted navigation bronchoscopy Cellvizio probe based confocal laser endomicroscopy (pCLE)  Indication: lung mass/nodule  Preoperative Diagnosis: Left upper lobe lung mass, rule out cancer Post Procedure Diagnosis: Left upper lobe lung mass, rule out cancer Consent: Verbal/Written  Benefits, limitations and potential complications of the procedure were discussed with the patient/family  including, but not limited to bleeding, hemoptysis, respiratory failure requiring intubation and/or prolongued mechanical ventilation, infection, pneumothorax (collapse of lung) requiring chest tube placement, strokeor even death.  Surgeon: Renold Don, MD Assistant/Scrub: Sullivan Lone, RRT Circulator: Annia Belt, RRT Anesthesiologist/CRNA: Phill Mutter, MD/Deana Lorenza Chick, CRNA Pathologist: Betsy Pries Cytopathology: Maryan Puls (ROSE) Fluoroscopy technician: Starleen Arms, RT  Type of Anesthesia: General endotracheal  Procedure Performed: 1) robotic navigation comprised of electromagnetics, optical pattern recognition and robotic kinematic data - to triangulate bronchoscope location during the procedure and provide accurate positional data to biopsy a lesion.  2) Cellvizio probe based confocal laser endomicroscopy (pCLE).  Description of Procedure: The patient was taken to Procedure Room 2 (Bronchoscopy Suite) appropriate timeout was taken.  Patient was placed on the superDimension table.  Patient was then inducted under general anesthesia by the anesthesia team.  She was intubated with an 8.5 ETT without difficulty.  A Portex adapter was placed on the ETT flange.  At this point the Olympus video bronchoscope was advanced through the Portex adapter and anatomic tour of the airway was performed.  The ET tube could be seen 4 cm above the carina.  The visible trachea was normal, carina was sharp, inspection of the right lung showed no abnormalities or endobronchial lesions on the right upper lobe,  right middle lobe and right lower lobe subsegments.  Inspection of the left lung showed no endobronchial lesions on left upper lobe, lingula and lower lobe subsegments.  The airway was without significant patient's and no mucus impaction.  At this point the bronchoscope was retrieved and the patient was prepared for robotic bronchoscopy.  The endotracheal tube was cut at the appropriate level for insertion of the robotic bronchoscope.  The proprietary Portex adapter was placed on the ET tube flange.  At this point the electromagnetic electrodes were placed on the patient's chest and the electromagnetic generator was positioned appropriately.  The [facility] robot was then aligned with the patient's airway via the introducer.  Once alignment was precise the bronchoscope was inserted into the patient's airway.  The bronchoscope was advanced and registration was then performed according to [facility] protocol.  Once registration was completed, the robotic scope was was navigated to the LEFT upper lobe target for tissue sampling.  The bronchoscope was then secured in position.  Target acquisition was confirmed with Cellvizio.  Cellvizio demonstrated abnormal tissue.  Fluoroscopy was used to confirm.  First passes were performed with a Monarch cytology brush.  3 passes were performed.  ROSE demonstrated lesional cells on the first pass.  The brush was cut into CytoLyt on the last pass.  All sample acquisition was done under fluoroscopy.  In similar fashion transbronchial biopsies were performed with Monarch biopsy forceps.  ROSE was performed on the second biopsy showing lesional cells.  A total of 10 biopsy specimens were obtained of the LEFT upper lobe lesion.  After this was completed, there bronchoscope was repositioned to sample a different area of the nodule.  A total of 2 endobronchial forceps biopsies were performed on the new direction.  Cellvizio endomicroscopy was performed and showed abnormal tissue consistent with  carcinoma.  Additional brushings x2 were performed.  Lastly,  targeted BAL was performed a total of 40 mL of saline were instilled yielding approximately 8 mL of aliquot.  Once this was completed, the patient received 9 mL of 1% lidocaine via bronchial lavage prior to retrieving the robotic bronchoscope.  The bronchoscope was then retrieved and the patient was allowed to emerge from general anesthesia, was extubated and was transferred to the PACU in satisfactory condition.  Patient tolerated procedure well.  No immediate complications noted.     Specimens Obtained:  Transbronchial brushings x5, total of 2 brushes used  Transbronchial Forceps Biopsy x12  Targeted BAL: 8 mL aliquot   Fluoroscopy:  Fluoroscopy was utilized during the course of this procedure to assure that biopsies were taken in a safe manner under fluoroscopic guidance with spot films required.   Fluoroscopy image:    Complications: None, postprocedure chest x-ray showed no pneumothorax:   Estimated Blood Loss: minimal,less than 5 mL   Assessment and Plan/Additional Comments: 1) LEFT upper lobe mass, status post successful robotic biopsy, appears malignant  Plan will be to await pathology report.   Renold Don, MD [facility]   *This note was dictated using voice recognition software/Dragon.  Despite best efforts to proofread, errors can occur which can change the meaning.  Any change was purely unintentional.

## 2020-09-30 NOTE — Transfer of Care (Signed)
Immediate Anesthesia Transfer of Care Note  Patient: Natasha [name]  Procedure(s) Performed: ROBOTIC ASSISTED VIDEO BRONCHOSCOPY WITH ENDOBRONCHIAL NAVIGATION (N/A )  Patient Location: PACU  Anesthesia Type:General  Level of Consciousness: awake, alert  and oriented  Airway & Oxygen Therapy: Patient Spontanous Breathing and Patient connected to face mask oxygen  Post-op Assessment: Report given to RN and Post -op Vital signs reviewed and stable  Post vital signs: Reviewed and stable  Last Vitals:  Vitals Value Taken Time  BP 148/78 09/30/20 1420  Temp    Pulse 81 09/30/20 1420  Resp 14 09/30/20 1420  SpO2 98 % 09/30/20 1420  Vitals shown include unvalidated device data.  Last Pain:  Vitals:   09/30/20 1420  TempSrc:   PainSc: 0-No pain         Complications: No complications documented.

## 2020-10-01 ENCOUNTER — Encounter: Payer: Self-pay | Admitting: Pulmonary Disease

## 2020-10-02 ENCOUNTER — Other Ambulatory Visit: Payer: Self-pay | Admitting: Pulmonary Disease

## 2020-10-02 ENCOUNTER — Other Ambulatory Visit: Payer: Self-pay | Admitting: Anatomic Pathology & Clinical Pathology

## 2020-10-02 DIAGNOSIS — C3492 Malignant neoplasm of unspecified part of left bronchus or lung: Secondary | ICD-10-CM

## 2020-10-02 LAB — CYTOLOGY - NON PAP

## 2020-10-02 LAB — SURGICAL PATHOLOGY

## 2020-10-02 NOTE — Progress Notes (Signed)
Patient was notified of results of the biopsy via telephone conversation.  Non-small cell carcinoma favor adenocarcinoma.  She is to have PET/CT 5/12.  OmniSeq will be requested.  Will place referral to the cancer center.  Renold Don, MD [facility]

## 2020-10-03 ENCOUNTER — Encounter: Payer: Self-pay | Admitting: *Deleted

## 2020-10-03 ENCOUNTER — Ambulatory Visit
Admission: RE | Admit: 2020-10-03 | Discharge: 2020-10-03 | Disposition: A | Discharge status: Home / Self Care | Payer: Medicare Other | Source: Ambulatory Visit | Attending: Pulmonary Disease | Admitting: Pulmonary Disease

## 2020-10-03 ENCOUNTER — Other Ambulatory Visit: Payer: Self-pay

## 2020-10-03 DIAGNOSIS — I517 Cardiomegaly: Secondary | ICD-10-CM | POA: Insufficient documentation

## 2020-10-03 DIAGNOSIS — Z90710 Acquired absence of both cervix and uterus: Secondary | ICD-10-CM | POA: Insufficient documentation

## 2020-10-03 DIAGNOSIS — R918 Other nonspecific abnormal finding of lung field: Secondary | ICD-10-CM | POA: Diagnosis not present

## 2020-10-03 DIAGNOSIS — R590 Localized enlarged lymph nodes: Secondary | ICD-10-CM | POA: Insufficient documentation

## 2020-10-03 DIAGNOSIS — Z9884 Bariatric surgery status: Secondary | ICD-10-CM | POA: Diagnosis not present

## 2020-10-03 LAB — GLUCOSE, CAPILLARY: Glucose-Capillary: 87 mg/dL (ref 70–99)

## 2020-10-03 MED ORDER — FLUDEOXYGLUCOSE F - 18 (FDG) INJECTION
7.9000 | Freq: Once | INTRAVENOUS | Status: AC | PRN
  Administered 2020-10-03: 7.83 via INTRAVENOUS

## 2020-10-03 NOTE — Progress Notes (Signed)
  Oncology Nurse Navigator Documentation  Navigator Location: [facility] (10/03/20 1000) Referral Date to RadOnc/MedOnc: 10/03/20 (10/03/20 1000) )Navigator Encounter Type: Introductory Phone Call (10/03/20 1000)   Abnormal Finding Date: 09/06/20 (10/03/20 1000) Confirmed Diagnosis Date: 10/02/20 (10/03/20 1000)                 Treatment Phase: Pre-Tx/Tx Discussion (10/03/20 1000) Barriers/Navigation Needs: Coordination of Care (10/03/20 1000)   Interventions: Coordination of Care (10/03/20 1000)   Coordination of Care: Appts;Radiology (10/03/20 1000)     referral received from Dr. Patsey Berthold for patient to follow up with oncology for newly diagnosed lung cancer. Phone call made to patient to schedule initial visit and introduce to navigator services. Pt scheduled with Dr. Tasia Catchings in the lung clinic on 10/11/20. Based on PET scan findings from today, will determine any additional appts needed on or before 5/20. Contact info given and instructed to call with any questions or needs. Pt verbalized understanding. Nothing further needed at this time.             Time Spent with Patient: 30 (10/03/20 1000)

## 2020-10-04 ENCOUNTER — Telehealth: Payer: Self-pay

## 2020-10-04 ENCOUNTER — Telehealth: Payer: Self-pay | Admitting: Pulmonary Disease

## 2020-10-04 MED ORDER — ELIQUIS 5 MG PO TABS: 1.0000 | ORAL_TABLET | Freq: Two times a day (BID) | ORAL | 0 refills | Status: DC

## 2020-10-04 NOTE — Telephone Encounter (Signed)
This was a one time dose given to her after bronchoscopy. I would recommend Delsym over the counter 10 ml BID as needed.

## 2020-10-04 NOTE — Telephone Encounter (Signed)
Patient stated that on 09/30/2020, it was mentioned that a cough syrup would be called in for her. [facility] has not received any Rx.  Dr. Patsey Berthold, please advise. Thanks

## 2020-10-04 NOTE — Telephone Encounter (Signed)
Pharmacy requests refill on: Eliquis 5 mg   LAST REFILL: 04/08/2020 (Q-180, R-0) LAST OV: 07/22/2020 NEXT OV: Not Scheduled  PHARMACY: [facility] [place], [place]

## 2020-10-04 NOTE — Telephone Encounter (Signed)
Patient is aware of below recommendations and voiced her understanding. Nothing further needed at this time.   

## 2020-10-10 ENCOUNTER — Other Ambulatory Visit: Payer: Medicare Other

## 2020-10-10 NOTE — Progress Notes (Signed)
Tumor Board Documentation  Natasha [name] was presented by Dr Duwayne Heck at our Tumor Board on 10/10/2020, which included representatives from medical oncology,radiation oncology,surgical oncology,internal medicine,navigation,pathology,radiology,pharmacy,genetics,research,palliative care,pulmonology.  Natasha [name] currently presents as a new patient,for [facility],for new positive pathology with history of the following treatments: surgical intervention(s),active survellience.  Additionally, we reviewed previous medical and familial history, history of present illness, and recent lab results along with all available histopathologic and imaging studies. The tumor board considered available treatment options and made the following recommendations:   Referred to Medical Oncology and [place] pending  The following procedures/referrals were also placed: No orders of the defined types were placed in this encounter.   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  Tumor board is a meeting of clinicians from various specialty areas who evaluate and discuss patients for whom a multidisciplinary approach is being considered. Final determinations in the plan of care are those of the provider(s). The responsibility for follow up of recommendations given during tumor board is that of the provider.   Today's extended care, comprehensive team conference, Natasha [name] was not present for the discussion and was not examined.   Multidisciplinary Tumor Board is a multidisciplinary case peer review process.  Decisions discussed in the Multidisciplinary Tumor Board reflect the opinions of the specialists present at the conference without having examined the patient.  Ultimately, treatment and diagnostic decisions rest with the primary  provider(s) and the patient.

## 2020-10-11 ENCOUNTER — Telehealth: Payer: Self-pay | Admitting: Cardiology

## 2020-10-11 ENCOUNTER — Ambulatory Visit
Admission: RE | Admit: 2020-10-11 | Discharge: 2020-10-11 | Disposition: A | Discharge status: Home / Self Care | Payer: Medicare Other | Source: Ambulatory Visit | Attending: Radiation Oncology | Admitting: Radiation Oncology

## 2020-10-11 ENCOUNTER — Other Ambulatory Visit: Payer: Self-pay

## 2020-10-11 ENCOUNTER — Encounter: Payer: Self-pay | Admitting: Oncology

## 2020-10-11 ENCOUNTER — Inpatient Hospital Stay: Payer: Medicare Other

## 2020-10-11 ENCOUNTER — Encounter: Payer: Self-pay | Admitting: *Deleted

## 2020-10-11 ENCOUNTER — Inpatient Hospital Stay: Payer: Medicare Other | Attending: Oncology | Admitting: Oncology

## 2020-10-11 VITALS — BP 126/75 | HR 74 | Temp 98.2°F | Resp 18 | Ht 64.0 in | Wt 148.5 lb

## 2020-10-11 DIAGNOSIS — I10 Essential (primary) hypertension: Secondary | ICD-10-CM | POA: Insufficient documentation

## 2020-10-11 DIAGNOSIS — Z803 Family history of malignant neoplasm of breast: Secondary | ICD-10-CM | POA: Insufficient documentation

## 2020-10-11 DIAGNOSIS — E119 Type 2 diabetes mellitus without complications: Secondary | ICD-10-CM | POA: Insufficient documentation

## 2020-10-11 DIAGNOSIS — Z79899 Other long term (current) drug therapy: Secondary | ICD-10-CM | POA: Insufficient documentation

## 2020-10-11 DIAGNOSIS — R590 Localized enlarged lymph nodes: Secondary | ICD-10-CM | POA: Diagnosis not present

## 2020-10-11 DIAGNOSIS — Z7901 Long term (current) use of anticoagulants: Secondary | ICD-10-CM | POA: Insufficient documentation

## 2020-10-11 DIAGNOSIS — N183 Chronic kidney disease, stage 3 unspecified: Secondary | ICD-10-CM | POA: Insufficient documentation

## 2020-10-11 DIAGNOSIS — Z87891 Personal history of nicotine dependence: Secondary | ICD-10-CM

## 2020-10-11 DIAGNOSIS — E7800 Pure hypercholesterolemia, unspecified: Secondary | ICD-10-CM | POA: Insufficient documentation

## 2020-10-11 DIAGNOSIS — C3412 Malignant neoplasm of upper lobe, left bronchus or lung: Secondary | ICD-10-CM

## 2020-10-11 DIAGNOSIS — I700 Atherosclerosis of aorta: Secondary | ICD-10-CM | POA: Insufficient documentation

## 2020-10-11 DIAGNOSIS — Z9884 Bariatric surgery status: Secondary | ICD-10-CM | POA: Diagnosis not present

## 2020-10-11 DIAGNOSIS — Z7189 Other specified counseling: Secondary | ICD-10-CM | POA: Diagnosis not present

## 2020-10-11 DIAGNOSIS — K449 Diaphragmatic hernia without obstruction or gangrene: Secondary | ICD-10-CM | POA: Insufficient documentation

## 2020-10-11 DIAGNOSIS — G4733 Obstructive sleep apnea (adult) (pediatric): Secondary | ICD-10-CM | POA: Insufficient documentation

## 2020-10-11 DIAGNOSIS — I272 Pulmonary hypertension, unspecified: Secondary | ICD-10-CM | POA: Insufficient documentation

## 2020-10-11 DIAGNOSIS — I4891 Unspecified atrial fibrillation: Secondary | ICD-10-CM | POA: Insufficient documentation

## 2020-10-11 NOTE — Progress Notes (Signed)
Patient here to establish care  

## 2020-10-11 NOTE — Progress Notes (Signed)
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 08/28/2020, MRI of the thoracic and lumbar spine at [facility] incidental finding of ill-defined mass on the left upper lobe. 09/04/2020, CT chest showed 2.1 x 1.9 cm left upper lobe spiculated mass.  No mediastinal lymphadenopathy noted. 1.3 cm right axillary lymph node noted. 10/03/2020 PET scan showed hypermetabolic left upper lobe nodule suspicious for bronchogenic neoplasm.  No signs of metastatic disease to neck/chest/abdomen/pelvis. Right axillary lymph node without metabolic activity.  Mild periumbilical fat stranding is of uncertain significance. 08/12/2020, screening mammogram showed a normal mammographic evidence of malignancy.  Patient has a history of gastric bypass and cholecystectomy and hysterectomy. She has sleep apnea and uses CPAP machine.   last year her CPAP machine part was recalled due to possible carcinogenic effect. Patient denies any hemoptysis, unintentional weight loss, cough.  Patient has received Moderna COVID 19 vaccination on 05/14/2020, 06/28/2019 and 07/26/2019 [right arm].   Review of Systems  Constitutional: Negative for appetite change, chills, fatigue and fever.  HENT:   Negative for hearing loss and voice change.   Eyes: Negative for eye problems.  Respiratory: Negative for chest tightness and cough.   Cardiovascular:  Negative for chest pain.  Gastrointestinal: Negative for abdominal distention, abdominal pain and blood in stool.  Endocrine: Negative for hot flashes.  Genitourinary: Negative for difficulty urinating and frequency.   Musculoskeletal: Negative for arthralgias.  Skin: Negative for itching and rash.  Neurological: Negative for extremity weakness.  Hematological: Negative for adenopathy.  Psychiatric/Behavioral: Negative for confusion.    MEDICAL HISTORY:  Past Medical History:  Diagnosis Date  . A-fib ([place])   . Anemia   . Aortic atherosclerosis ([place])   . Aortic stenosis, mild   . Asthma    mild  . Chronic cough   . CKD (chronic kidney disease), stage III ([place])   . Diverticulitis   . High cholesterol   . History of hiatal hernia   . Hypertension   . Insomnia   . Murmur   . Restless leg   . Sleep apnea   . T2DM (type 2 diabetes mellitus) ([place])     SURGICAL HISTORY: Past Surgical History:  Procedure Laterality Date  . ABDOMINAL HYSTERECTOMY  1978  . APPENDECTOMY  1978  . BREAST BIOPSY Left ?   papilloma  . BREAST CYST EXCISION Bilateral yrs ago   benign, scars not well visualized  . BREAST SURGERY    . CATARACT EXTRACTION W/ INTRAOCULAR LENS  IMPLANT, BILATERAL Bilateral   . CHOLECYSTECTOMY    . COLONOSCOPY    . ELBOW SURGERY Right    Bosworth release  . EYE SURGERY    . FRACTURE SURGERY    . 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 JOINT REPLACEMENT    . KNEE SURGERY Left    tibial fracture with metal plate  . TOTAL SHOULDER REPLACEMENT Left 2014  . VIDEO BRONCHOSCOPY WITH ENDOBRONCHIAL  NAVIGATION N/A 09/30/2020   Procedure: ROBOTIC ASSISTED VIDEO BRONCHOSCOPY WITH ENDOBRONCHIAL NAVIGATION;  Surgeon: Tyler Pita, MD;  Location: [facility] ORS;  Service: Pulmonary;  Laterality: N/A;  . WRIST SURGERY Left    fractures    SOCIAL HISTORY: Social History   Socioeconomic History  . Marital status: Married    Spouse name: Marlou Sa  . Number of children: 1  . Years of  education: some college  . Highest education level: Not on file  Occupational History  . Not on file  Tobacco Use  . Smoking status: Former Smoker    Packs/day: 1.00    Years: 12.00    Pack years: 12.00    Types: Cigarettes    Quit date: 05/26/1975    Years since quitting: 45.4  . Smokeless tobacco: Never Used  Vaping Use  . Vaping Use: Never used  Substance and Sexual Activity  . Alcohol use: No    Comment: rarely  . Drug use: Never  . Sexual activity: Not Currently  Other Topics Concern  . Not on file  Social History Narrative   07/22/20   [place]   Living: with husband, Marlou Sa [phone])   Work: retired - high end Journalist, newspaper      Family: grandson - Ovid Curd 08/26/1998) (son is deceased) - and living with them      Enjoys: Enjoys Social worker, going to art shows, gardening and painting      Exercise: not currently   Diet: does not follow diabetic diet      Safety   Seat belts: Yes    Guns: Yes  and secure   Safe in relationships: Yes    Social Determinants of Health   Financial Resource Strain: Not on file  Food Insecurity: Not on file  Transportation Needs: Not on file  Physical Activity: Not on file  Stress: Not on file  Social Connections: Not on file  Intimate Partner Violence: Not on file    FAMILY HISTORY: Family History  Problem Relation Age of Onset  . Other Mother        died from surgery  . AAA (abdominal aortic aneurysm) Mother   . Diabetes Father        controlled by diet  . Dementia Father        brain atrophy - unknown origin  . Breast cancer Cousin        maternal    ALLERGIES:  is allergic to atorvastatin, lantus [insulin glargine], and lisinopril.  MEDICATIONS:  Current Outpatient Medications  Medication Sig Dispense Refill  . apixaban (ELIQUIS) 5 MG TABS tablet Take 1 tablet (5 mg total) by mouth 2 (two) times daily. 180 tablet 0  . Calcium Carb-Cholecalciferol (CALCIUM 600+D3 PO) Take 2  tablets by mouth in the morning.    . Ferrous Gluconate (IRON 27 PO) Take 27 mg by mouth in the morning.    Marland Kitchen JANUMET 50-1000 MG tablet Take 1 tablet by mouth in the morning.    . Multiple Vitamin (MULTIVITAMIN WITH MINERALS) TABS tablet Take 1 tablet by mouth in the morning.    . pramipexole (MIRAPEX) 1 MG tablet Take 1 tablet (1 mg total) by mouth at bedtime. 90 tablet 3  . rosuvastatin (CRESTOR) 20 MG tablet Take 1 tablet (20 mg total) by mouth at bedtime. 90 tablet 3  . sertraline (ZOLOFT) 50 MG tablet Take 1 tablet (50 mg total) by mouth at bedtime. 90 tablet  3  . TRESIBA FLEXTOUCH 100 UNIT/ML SOPN FlexTouch Pen Inject 10 Units into the skin at bedtime.     . vitamin E 180 MG (400 UNITS) capsule Take 400 Units by mouth daily.     No current facility-administered medications for this visit.     PHYSICAL EXAMINATION: ECOG PERFORMANCE STATUS: 0 - Asymptomatic Vitals:   10/11/20 0911  BP: 126/75  Pulse: 74  Resp: 18  Temp: 98.2 F (36.8 C)  SpO2: 99%   Filed Weights   10/11/20 0911  Weight: 148 lb 8 oz (67.4 kg)    Physical Exam Constitutional:      General: She is not in acute distress. HENT:     Head: Normocephalic and atraumatic.  Eyes:     General: No scleral icterus. Cardiovascular:     Rate and Rhythm: Normal rate and regular rhythm.     Heart sounds: Murmur heard.    Pulmonary:     Effort: Pulmonary effort is normal. No respiratory distress.     Breath sounds: No wheezing.  Abdominal:     General: Bowel sounds are normal. There is no distension.     Palpations: Abdomen is soft.  Musculoskeletal:        General: No deformity. Normal range of motion.     Cervical back: Normal range of motion and neck supple.  Skin:    General: Skin is warm and dry.     Findings: No erythema or rash.  Neurological:     Mental Status: She is alert and oriented to person, place, and time. Mental status is at baseline.     Cranial Nerves: No cranial nerve deficit.      Coordination: Coordination normal.  Psychiatric:        Mood and Affect: Mood normal.     LABORATORY DATA:  I have reviewed the data as listed Lab Results  Component Value Date   WBC 9.2 07/22/2020   HGB 11.5 (L) 07/22/2020   HCT 33.8 (L) 07/22/2020   MCV 89.4 07/22/2020   PLT 240.0 07/22/2020   Recent Labs    07/22/20 1511  NA 137  K 4.6  CL 104  CO2 24  GLUCOSE 270*  BUN 23  CREATININE 1.30*  CALCIUM 9.3  PROT 6.1  ALBUMIN 4.1  AST 44*  ALT 30  ALKPHOS 77  BILITOT 0.3   Iron/TIBC/Ferritin/ %Sat    Component Value Date/Time   FERRITIN 21.2 07/22/2020 1511      RADIOGRAPHIC STUDIES: I have personally reviewed the radiological images as listed and agreed with the findings in the report. NM PET Image Initial (PI) Skull Base To Thigh  Result Date: 10/04/2020 CLINICAL DATA:  Initial treatment strategy for lung nodule. EXAM: NUCLEAR MEDICINE PET SKULL BASE TO THIGH TECHNIQUE: 7.83 mCi F-18 FDG was injected intravenously. Full-ring PET imaging was performed from the skull base to thigh after the radiotracer. CT data was obtained and used for attenuation correction and anatomic localization. Fasting blood glucose: 87 mg/dl COMPARISON:  Chest CT September 04, 2020. FINDINGS: Mediastinal blood pool activity: SUV max 2.41 Liver activity: SUV max not applicable NECK: No hypermetabolic lymph nodes in the neck. Incidental CT findings: Streak artifact from dental hardware and LEFT shoulder arthroplasty does mildly limit assessment of the neck. Also limiting assessment of the upper chest with respect to LEFT shoulder arthroplasty. CHEST: Spiculated mass in the LEFT upper lobe with some surrounding ground-glass and spiculation extending to the pleural surface in the LEFT upper lobe (image  69/3) measures approximately 2.2 x 1.4 cm which is unchanged compared to recent imaging. Maximum SUV of 10.9. No signs of adenopathy in the chest. Areas of basilar atelectasis. No effusion. Airways are  patent. Incidental CT findings: Calcified atheromatous plaque in the thoracic aorta without aneurysm. Moderate cardiomegaly. Signs of mitral annular calcification. No pericardial effusion. Esophagus grossly normal. Top-normal RIGHT axillary lymph node measuring 10 mm maximum SUV less than mediastinal blood pool (image 67/3) ABDOMEN/PELVIS: No abnormal hypermetabolic activity within the liver, pancreas, adrenal glands, or spleen. No hypermetabolic lymph nodes in the abdomen or pelvis. Incidental CT findings: Post cholecystectomy and gastric bypass. No acute findings related to liver, spleen, pancreas, adrenal glands, kidneys, stomach, small or large bowel. Small cyst arises from the posterior RIGHT kidney and potential angiomyolipoma measuring approximately 7 mm in the interpolar LEFT kidney (image 139/3) signs of colonic diverticulosis. Mild periumbilical stranding is noted with low level FDG uptake extending inferiorly from the umbilicus. Calcified atheromatous plaque of the abdominal aorta without aneurysmal dilation. SKELETON: No focal hypermetabolic activity to suggest skeletal metastasis. Incidental CT findings: LEFT shoulder arthroplasty. Spinal degenerative changes. IMPRESSION: 1. Hypermetabolic LEFT upper lobe nodule suspicious for bronchogenic neoplasm. No signs of metastatic disease to the neck, chest, abdomen or pelvis. 2. Mildly enlarged RIGHT axillary lymph node without metabolic activity above mediastinal blood pool. This may be reactive. Would also correlate with recent COVID vaccination if applicable. Consider attention on follow-up. 3. Mild periumbilical fat stranding is of uncertain significance. Correlate with any signs of cellulitis/panniculitis. 4. 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 Electronically Signed   By: Zetta Bills M.D.   On: 10/04/2020 13:04   DG Chest Port 1 View  Result Date: 09/30/2020 CLINICAL DATA:  Post biopsy EXAM: PORTABLE CHEST 1 VIEW  COMPARISON:  Portable exam 1432 hours compared to 04/14/2017 Correlation: CT chest 09/04/2020 FINDINGS: Normal heart size, mediastinal contours, and pulmonary vascularity. Inter aorta Infiltrate identified in LEFT upper lobe post biopsy of known LEFT upper lobe nodule. This may reflect edema or hemorrhage at the biopsy site. Remaining lungs clear. No pleural effusion or pneumothorax. IMPRESSION: Mild infiltrate in LEFT upper lobe which may reflect mild edema or hemorrhage secondary to biopsy of known nodule. No pneumothorax. Electronically Signed   By: Lavonia Dana M.D.   On: 09/30/2020 16:10   DG C-Arm 1-60 Min-No Report  Result Date: 09/30/2020 Fluoroscopy was utilized by the requesting physician.  No radiographic interpretation.      ASSESSMENT & PLAN:  1. Cancer of upper lobe of left lung ([place])   2. 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 Consensus recommendation is patient to establish care with radiation oncology for evaluation of SBRT.  Goals of treatment is with curative intent. Plan was discussed with patient and she agrees with the plan . #Right axillary lymphadenopathy, unclear etiology. Most recent vaccination was 10 weeks prior to the PET scan. Recommend ultrasound-guided biopsy to clarify.  Discussed on tumor board.   Orders Placed This Encounter  Procedures  . [place] CORE BIOPSY (LYMPH NODES)    Standing Status:   Future    Standing  Expiration Date:   10/11/2021    Order Specific Question:   Lab orders requested (DO NOT place separate lab orders, these will be automatically ordered  during procedure specimen collection):    Answer:   Surgical Pathology    Order Specific Question:   Reason for Exam (SYMPTOM  OR DIAGNOSIS REQUIRED)    Answer:   right axilla lymph node    Order Specific Question:   Preferred location?    Answer:   [facility]    All questions were answered. The patient knows to call the clinic with any problems questions or concerns.   Lesleigh Noe, MD    Return of visit: Patient can follow-up with radiology for surveillance. Thank you for this kind referral and the opportunity to participate in the care of this patient. A copy of today's note is routed to referring provider    Earlie Server, MD, PhD Hematology Oncology [facility] at [facility] Pager- [phone] 10/11/2020

## 2020-10-11 NOTE — Progress Notes (Signed)
Met with patient and her husband during initial consult with Dr. Tasia Catchings and Dr. Baruch Gouty. All questions answered during visit. Reviewed upcoming appts with pt. Informed that she will be notified by phone once scheduled for right axilla lymph node biopsy. Contact info given and instructed to call with any further questions or needs. Pt verbalized understanding.

## 2020-10-11 NOTE — Telephone Encounter (Signed)
   [facility] Pre-operative Risk Assessment    Patient Name: Natasha [name]  DOB: 1940-11-13  MRN: [id number]   HEARTCARE STAFF: - Please ensure there is not already an duplicate clearance open for this procedure. - Under Visit Info/Reason for Call, type in Other and utilize the format Clearance MM/DD/YY or Clearance TBD. Do not use dashes or single digits. - If request is for dental extraction, please clarify the # of teeth to be extracted.  Request for surgical clearance:  1. What type of surgery is being performed? biopsy   2. When is this surgery scheduled? TBD, within next week  3. What type of clearance is required (medical clearance vs. Pharmacy clearance to hold med vs. Both)? pharm  4. Are there any medications that need to be held prior to surgery and how long? Eliquis 2 days prior  5. Practice name and name of physician performing surgery? [facility]  6. What is the office phone number? [phone]   7.   What is the office fax number? [phone]  8.   Anesthesia type (None, local, MAC, general) ? Not noted   Marykay Lex 10/11/2020, 3:52 PM  _________________________________________________________________   (provider comments below)

## 2020-10-11 NOTE — Consult Note (Signed)
NEW PATIENT EVALUATION  Name: Natasha [name]  MRN: [id number]  Date:   10/11/2020     DOB: 03-26-1941   This 80 y.o. 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  REFERRING PHYSICIAN: Lesleigh Noe, MD  CHIEF COMPLAINT: No chief complaint on file.   DIAGNOSIS: The encounter diagnosis was Cancer of upper lobe of left lung ([place]).   PREVIOUS INVESTIGATIONS:  CT scan and PET CT scan reviewed Clinical notes reviewed Pathology report reviewed Case presented at weekly tumor conference  HPI: Patient is an 80 year old female who is part of the work-up for back pain had an MRI scan which incidentally found a 2.1 spiculated density in the upper left upper lobe concerning for malignancy.  She has had a 1.3 cm right axillary lymph node although she had been vaccinated and her right arm for COVID.  PET scan was performed showing hypermetabolic activity in left upper lobe consistent with malignancy axillary lymph node was background metabolic activity.  She underwent robotic assisted navigational bronchoscopy which showed non-small cell lung cancer consistent with adenocarcinoma of the left upper lobe lesion.  She is scheduled for an ultrasound-guided biopsy of the right axillary lymph node although this appears benign from the PET/CT.  Patient does have atrial fibrillation currently on Eliquis.  She also has pulmonary hypertension mild and obstructive sleep apnea.  She is seen today for radiation oncology opinion.  She specifically [place] cough hemoptysis or chest tightness.  PLANNED TREATMENT REGIMEN: SBRT  PAST MEDICAL HISTORY:  has a past medical history of A-fib ([place]), Anemia, Aortic atherosclerosis ([place]), Aortic stenosis, mild, Asthma, Chronic cough, CKD (chronic kidney disease), stage III ([place]), Diverticulitis, High cholesterol, History of hiatal hernia, Hypertension, Insomnia, Murmur, Restless leg, Sleep  apnea, and T2DM (type 2 diabetes mellitus) ([place]).    PAST SURGICAL HISTORY:  Past Surgical History:  Procedure Laterality Date  . ABDOMINAL HYSTERECTOMY  1978  . APPENDECTOMY  1978  . BREAST BIOPSY Left ?   papilloma  . BREAST CYST EXCISION Bilateral yrs ago   benign, scars not well visualized  . BREAST SURGERY    . CATARACT EXTRACTION W/ INTRAOCULAR LENS  IMPLANT, BILATERAL Bilateral   . CHOLECYSTECTOMY    . COLONOSCOPY    . ELBOW SURGERY Right    Bosworth release  . EYE SURGERY    . FRACTURE SURGERY    . 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 JOINT REPLACEMENT    . KNEE SURGERY Left    tibial fracture with metal plate  . TOTAL SHOULDER REPLACEMENT Left 2014  . VIDEO BRONCHOSCOPY WITH ENDOBRONCHIAL NAVIGATION N/A 09/30/2020   Procedure: ROBOTIC ASSISTED VIDEO BRONCHOSCOPY WITH ENDOBRONCHIAL NAVIGATION;  Surgeon: Tyler Pita, MD;  Location: [facility] ORS;  Service: Pulmonary;  Laterality: N/A;  . WRIST SURGERY Left    fractures    FAMILY HISTORY: family history includes AAA (abdominal aortic aneurysm) in her mother; Breast cancer in her cousin; Dementia in her father; Diabetes in her father; Other in her mother.  SOCIAL HISTORY:  reports that she quit smoking about 45 years ago. Her smoking use included cigarettes. She has a 12.00 pack-year smoking history. She has never used smokeless tobacco. She reports that she does not drink alcohol and does not use drugs.  ALLERGIES: Atorvastatin, Lantus [insulin glargine], and Lisinopril  MEDICATIONS:  Current Outpatient Medications  Medication Sig Dispense Refill  . apixaban (ELIQUIS) 5 MG TABS tablet Take 1  tablet (5 mg total) by mouth 2 (two) times daily. 180 tablet 0  . Calcium Carb-Cholecalciferol (CALCIUM 600+D3 PO) Take 2 tablets by mouth in the morning.    . Ferrous Gluconate (IRON 27 PO) Take 27 mg by mouth in the morning.    Marland Kitchen JANUMET 50-1000 MG tablet Take 1 tablet by mouth in the morning.    . Multiple Vitamin (MULTIVITAMIN WITH  MINERALS) TABS tablet Take 1 tablet by mouth in the morning.    . pramipexole (MIRAPEX) 1 MG tablet Take 1 tablet (1 mg total) by mouth at bedtime. 90 tablet 3  . rosuvastatin (CRESTOR) 20 MG tablet Take 1 tablet (20 mg total) by mouth at bedtime. 90 tablet 3  . sertraline (ZOLOFT) 50 MG tablet Take 1 tablet (50 mg total) by mouth at bedtime. 90 tablet 3  . TRESIBA FLEXTOUCH 100 UNIT/ML SOPN FlexTouch Pen Inject 10 Units into the skin at bedtime.     . vitamin E 180 MG (400 UNITS) capsule Take 400 Units by mouth daily.     No current facility-administered medications for this encounter.    ECOG PERFORMANCE STATUS:  0 - Asymptomatic  REVIEW OF SYSTEMS: Patient denies any weight loss, fatigue, weakness, fever, chills or night sweats. Patient denies any loss of vision, blurred vision. Patient denies any ringing  of the ears or hearing loss. No irregular heartbeat. Patient denies heart murmur or history of fainting. Patient denies any chest pain or pain radiating to her upper extremities. Patient denies any shortness of breath, difficulty breathing at night, cough or hemoptysis. Patient denies any swelling in the lower legs. Patient denies any nausea vomiting, vomiting of blood, or coffee ground material in the vomitus. Patient denies any stomach pain. Patient states has had normal bowel movements no significant constipation or diarrhea. Patient denies any dysuria, hematuria or significant nocturia. Patient denies any problems walking, swelling in the joints or loss of balance. Patient denies any skin changes, loss of hair or loss of weight. Patient denies any excessive worrying or anxiety or significant depression. Patient denies any problems with insomnia. Patient denies excessive thirst, polyuria, polydipsia. Patient denies any swollen glands, patient denies easy bruising or easy bleeding. Patient denies any recent infections, allergies or URI. Patient "s visual fields have not changed significantly in  recent time.   PHYSICAL EXAM: There were no vitals taken for this visit. Well-developed well-nourished patient in NAD. HEENT reveals PERLA, EOMI, discs not visualized.  Oral cavity is clear. No oral mucosal lesions are identified. Neck is clear without evidence of cervical or supraclavicular adenopathy. Lungs are clear to A&P. Cardiac examination is essentially unremarkable with regular rate and rhythm without murmur rub or thrill. Abdomen is benign with no organomegaly or masses noted. Motor sensory and DTR levels are equal and symmetric in the upper and lower extremities. Cranial nerves II through XII are grossly intact. Proprioception is intact. No peripheral adenopathy or edema is identified. No motor or sensory levels are noted. Crude visual fields are within normal range.  LABORATORY DATA: Pathology report reviewed    RADIOLOGY RESULTS: CT scans and PET CT scans reviewed compatible with above-stated findings   IMPRESSION: Stage I non-small cell lung cancer left upper lobe favoring adenocarcinoma in 80 year old female  PLAN: At this time I have recommended SBRT to her left upper lobe nodule.  Would plan on delivering 60 Gray over 5 fractions.  I would use 4-dimensional treatment planning as well as motion restriction during simulation.  Risks and benefits  of treatment including possible development of cough fatigue architectural distortion of her lung after treatment all were discussed in detail with the patient and her husband.  They both seem to comprehend her treatment plan well.  I would be very surprised if her axial lymph node was anything but reactive based on the PET/CT findings and unusual presentation of a stage I lung cancer on the left side going to a right axillary lymph node.  Patient comprehends my recommendations well.  I personally set up and ordered CT simulation.  I would like to take this opportunity to thank you for allowing me to participate in the care of your  patient.Noreene Filbert, MD

## 2020-10-14 ENCOUNTER — Telehealth: Payer: Self-pay | Admitting: Family Medicine

## 2020-10-14 DIAGNOSIS — G2581 Restless legs syndrome: Secondary | ICD-10-CM

## 2020-10-14 MED ORDER — PRAMIPEXOLE DIHYDROCHLORIDE 1 MG PO TABS: 1.0000 mg | ORAL_TABLET | Freq: Every day | ORAL | 0 refills | Status: DC

## 2020-10-14 NOTE — Telephone Encounter (Signed)
Notes faxed to surgeon. This phone note will be removed from the preop pool. Richardson Dopp, PA-C  10/14/2020 1:35 PM

## 2020-10-14 NOTE — Telephone Encounter (Signed)
  LAST APPOINTMENT DATE: 10/04/2020   NEXT APPOINTMENT DATE:@Visit  date not found  MEDICATION: Pramipexole 1 mg tablet  [facility] [place]   Patient is completely out of this medication.   Let patient know to contact pharmacy at the end of the day to make sure medication is ready.  Please notify patient to allow 48-72 hours to process  Encourage patient to contact the pharmacy for refills or they can request refills through [place]:   LAST REFILL:  QTY:  REFILL DATE:    OTHER COMMENTS:    Okay for refill?  Please advise

## 2020-10-14 NOTE — Addendum Note (Signed)
Addended by: Loreen Freud on: 10/14/2020 02:45 PM   Modules accepted: Orders

## 2020-10-14 NOTE — Telephone Encounter (Signed)
Patient with diagnosis of afib on Eliquis for anticoagulation.    Procedure: biopsy Date of procedure: TBD  CHA2DS2-VASc Score = 5  This indicates a 7.2% annual risk of stroke. The patient's score is based upon: CHF History: No HTN History: Yes Diabetes History: Yes Stroke History: No Vascular Disease History: No Age Score: 2 Gender Score: 1   CrCl 55mL/min Platelet count 240K  Per office protocol, patient can hold Eliquis for 2 days prior to procedure.

## 2020-10-15 ENCOUNTER — Encounter: Payer: Self-pay | Admitting: *Deleted

## 2020-10-15 NOTE — Progress Notes (Signed)
Patient on schedule for LN axillary biopsy 10/18/2020, called and spoke with patient on phone with pre procedure instructions given.made aware to be here @ 1230,NPO after 0630, and driver post procedure for sedation. Stated understanding.

## 2020-10-17 ENCOUNTER — Other Ambulatory Visit: Payer: Self-pay | Admitting: Radiology

## 2020-10-18 ENCOUNTER — Ambulatory Visit
Admission: RE | Admit: 2020-10-18 | Discharge: 2020-10-18 | Disposition: A | Discharge status: Home / Self Care | Payer: Medicare Other | Source: Ambulatory Visit | Attending: Radiation Oncology | Admitting: Radiation Oncology

## 2020-10-18 ENCOUNTER — Other Ambulatory Visit: Payer: Self-pay

## 2020-10-18 ENCOUNTER — Ambulatory Visit
Admission: RE | Admit: 2020-10-18 | Discharge: 2020-10-18 | Disposition: A | Discharge status: Home / Self Care | Payer: Medicare Other | Source: Ambulatory Visit | Attending: Oncology | Admitting: Oncology

## 2020-10-18 DIAGNOSIS — Z794 Long term (current) use of insulin: Secondary | ICD-10-CM | POA: Insufficient documentation

## 2020-10-18 DIAGNOSIS — E119 Type 2 diabetes mellitus without complications: Secondary | ICD-10-CM | POA: Insufficient documentation

## 2020-10-18 DIAGNOSIS — R590 Localized enlarged lymph nodes: Secondary | ICD-10-CM

## 2020-10-18 DIAGNOSIS — E1122 Type 2 diabetes mellitus with diabetic chronic kidney disease: Secondary | ICD-10-CM | POA: Insufficient documentation

## 2020-10-18 DIAGNOSIS — Z87891 Personal history of nicotine dependence: Secondary | ICD-10-CM | POA: Insufficient documentation

## 2020-10-18 DIAGNOSIS — C3412 Malignant neoplasm of upper lobe, left bronchus or lung: Secondary | ICD-10-CM | POA: Insufficient documentation

## 2020-10-18 DIAGNOSIS — I4891 Unspecified atrial fibrillation: Secondary | ICD-10-CM | POA: Insufficient documentation

## 2020-10-18 DIAGNOSIS — I129 Hypertensive chronic kidney disease with stage 1 through stage 4 chronic kidney disease, or unspecified chronic kidney disease: Secondary | ICD-10-CM | POA: Insufficient documentation

## 2020-10-18 DIAGNOSIS — N183 Chronic kidney disease, stage 3 unspecified: Secondary | ICD-10-CM | POA: Insufficient documentation

## 2020-10-18 DIAGNOSIS — Z7901 Long term (current) use of anticoagulants: Secondary | ICD-10-CM | POA: Insufficient documentation

## 2020-10-18 DIAGNOSIS — Z79899 Other long term (current) drug therapy: Secondary | ICD-10-CM | POA: Insufficient documentation

## 2020-10-18 LAB — GLUCOSE, CAPILLARY: Glucose-Capillary: 116 mg/dL — ABNORMAL HIGH (ref 70–99)

## 2020-10-18 MED ORDER — FENTANYL CITRATE (PF) 100 MCG/2ML IJ SOLN
INTRAMUSCULAR | Status: AC | PRN
  Administered 2020-10-18: 50 ug via INTRAVENOUS

## 2020-10-18 MED ORDER — SODIUM CHLORIDE 0.9 % IV SOLN
INTRAVENOUS | Status: DC
Start: 2020-10-18 — End: 2020-10-19

## 2020-10-18 MED ORDER — MIDAZOLAM HCL 2 MG/2ML IJ SOLN
INTRAMUSCULAR | Status: AC | PRN
  Administered 2020-10-18: 1 mg via INTRAVENOUS

## 2020-10-18 NOTE — Progress Notes (Signed)
Patient clinically stable post Axillary LN biopsy per Dr Laurence Ferrari, tolerated well. Received Versed 1 mg along with Fentanyl 50 mcg IV given for procedure. Report given to Fransico Michael RN post procedure.in specials.

## 2020-10-18 NOTE — Consult Note (Signed)
Chief Complaint: Patient was seen in consultation today for right axillary lymphadenopathy  Referring Physician(s): Yu,Zhou  Supervising Physician: Malachy Moan  Patient Status: [facility] - Out-pt  History of Present Illness: Natausha Jungwirth is a 80 y.o. 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  She also had an enlarged right axillary lymph node on her CT Chest 4/13 which was not hypermetabolic by PET, however this was discussed at tumor and team consensus was to proceed with diagnostic biopsy to clarify reactive vs. Metastasis.    Mrs. Hattabaugh presents to [facility] today in her usual state of health. She report mild anxiety related to pain control with procedure today.  States she has undergone several "local only" procedures without relief of her pain despite several different formulations used.  She requests sedation for the procedure today.  She has been NPO. Her hubsand will be her ride home today.   Past Medical History:  Diagnosis Date  . A-fib (HCC)   . Anemia   . Aortic atherosclerosis (HCC)   . Aortic stenosis, mild   . Asthma    mild  . Chronic cough   . CKD (chronic kidney disease), stage III (HCC)   . Diverticulitis   . High cholesterol   . History of hiatal hernia   . Hypertension   . Insomnia   . Murmur   . Restless leg   . Sleep apnea   . T2DM (type 2 diabetes mellitus) (HCC)     Past Surgical History:  Procedure Laterality Date  . ABDOMINAL HYSTERECTOMY  1978  . APPENDECTOMY  1978  . BREAST BIOPSY Left ?   papilloma  . BREAST CYST EXCISION Bilateral yrs ago   benign, scars not well visualized  . BREAST SURGERY    . CATARACT EXTRACTION W/ INTRAOCULAR LENS  IMPLANT, BILATERAL Bilateral   . CHOLECYSTECTOMY    . COLONOSCOPY    . ELBOW SURGERY Right    [place] release  . EYE SURGERY    . FRACTURE  SURGERY    . 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 JOINT REPLACEMENT    . KNEE SURGERY Left    tibial fracture with metal plate  . TOTAL SHOULDER REPLACEMENT Left 2014  . VIDEO BRONCHOSCOPY WITH ENDOBRONCHIAL NAVIGATION N/A 09/30/2020   Procedure: ROBOTIC ASSISTED VIDEO BRONCHOSCOPY WITH ENDOBRONCHIAL NAVIGATION;  Surgeon: Salena Saner, MD;  Location: [facility] ORS;  Service: Pulmonary;  Laterality: N/A;  . WRIST SURGERY Left    fractures    Allergies: Atorvastatin, Lantus [insulin glargine], and Lisinopril  Medications: Prior to Admission medications   Medication Sig Start Date End Date Taking? Authorizing Provider  apixaban (ELIQUIS) 5 MG TABS tablet Take 1 tablet (5 mg total) by mouth 2 (two) times daily. 10/04/20   Lynnda Child, MD  Calcium Carb-Cholecalciferol (CALCIUM 600+D3 PO) Take 2 tablets by mouth in the morning.    [provider]  Ferrous Gluconate (IRON 27 PO) Take 27 mg by mouth in the morning.    [provider]  JANUMET 50-1000 MG tablet Take 1 tablet by mouth in the morning. 02/08/17   [provider]  Multiple Vitamin (MULTIVITAMIN WITH MINERALS) TABS tablet Take 1 tablet by mouth in the morning.    [provider]  pramipexole (MIRAPEX) 1 MG tablet Take 1 tablet (1 mg total) by mouth at bedtime.  Appt with PCP needed for further refills. 10/14/20   Lesleigh Noe, MD  rosuvastatin (CRESTOR) 20 MG tablet Take 1 tablet (20 mg total) by mouth at bedtime. 02/02/20   Elby Beck, FNP  sertraline (ZOLOFT) 50 MG tablet Take 1 tablet (50 mg total) by mouth at bedtime. 11/06/19   Elby Beck, FNP  TRESIBA FLEXTOUCH 100 UNIT/ML SOPN FlexTouch Pen Inject 10 Units into the skin at bedtime.  03/26/17   [provider]  vitamin E 180 MG (400 UNITS) capsule Take 400 Units by mouth daily.    [provider]     Family History  Problem Relation Age of Onset  . Other Mother        died from surgery  . AAA (abdominal aortic  aneurysm) Mother   . Diabetes Father        controlled by diet  . Dementia Father        brain atrophy - unknown origin  . Breast cancer Cousin        maternal    Social History   Socioeconomic History  . Marital status: Married    Spouse name: Marlou Sa  . Number of children: 1  . Years of education: some college  . Highest education level: Not on file  Occupational History  . Not on file  Tobacco Use  . Smoking status: Former Smoker    Packs/day: 1.00    Years: 12.00    Pack years: 12.00    Types: Cigarettes    Quit date: 05/26/1975    Years since quitting: 45.4  . Smokeless tobacco: Never Used  Vaping Use  . Vaping Use: Never used  Substance and Sexual Activity  . Alcohol use: No    Comment: rarely  . Drug use: Never  . Sexual activity: Not Currently  Other Topics Concern  . Not on file  Social History Narrative   07/22/20   [place] near [place]   Living: with husband, Marlou Sa [phone])   Work: retired - high end Journalist, newspaper      Family: grandson - Ovid Curd 08-20-1998) (son is deceased) - and living with them      Enjoys: Enjoys Social worker, going to art shows, gardening and painting      Exercise: not currently   Diet: does not follow diabetic diet      Safety   Seat belts: Yes    Guns: Yes  and secure   Safe in relationships: Yes    Social Determinants of Health   Financial Resource Strain: Not on file  Food Insecurity: Not on file  Transportation Needs: Not on file  Physical Activity: Not on file  Stress: Not on file  Social Connections: Not on file     Review of Systems: A 12 point ROS discussed and pertinent positives are indicated in the HPI above.  All other systems are negative.  Review of Systems  Constitutional: Negative for fatigue and fever.  Respiratory: Negative for cough and shortness of breath.   Cardiovascular: Negative for chest pain.  Gastrointestinal: Negative for abdominal pain, diarrhea, nausea and  vomiting.  Genitourinary: Negative for dysuria.  Musculoskeletal: Negative for back pain.  Psychiatric/Behavioral: Negative for behavioral problems and confusion.    Vital Signs: BP (!) 133/91   Pulse 65   Resp 20   SpO2 98%   Physical Exam Vitals and nursing note reviewed.  Constitutional:      General: She  is not in acute distress.    Appearance: Normal appearance. She is not ill-appearing.  HENT:     Mouth/Throat:     Mouth: Mucous membranes are moist.     Pharynx: Oropharynx is clear.  Cardiovascular:     Rate and Rhythm: Normal rate and regular rhythm.  Pulmonary:     Effort: Pulmonary effort is normal. No respiratory distress.     Breath sounds: Normal breath sounds.  Musculoskeletal:     Cervical back: Normal range of motion and neck supple.     Comments: R axillary node not palpable by exam today  Skin:    General: Skin is warm and dry.  Neurological:     General: No focal deficit present.     Mental Status: She is alert and oriented to person, place, and time. Mental status is at baseline.  Psychiatric:        Mood and Affect: Mood normal.        Behavior: Behavior normal.        Thought Content: Thought content normal.        Judgment: Judgment normal.      MD Evaluation Airway: WNL Heart: WNL Abdomen: WNL Chest/ Lungs: WNL ASA  Classification: 3 Mallampati/Airway Score: One   Imaging: NM PET Image Initial (PI) Skull Base To Thigh  Result Date: 10/04/2020 CLINICAL DATA:  Initial treatment strategy for lung nodule. EXAM: NUCLEAR MEDICINE PET SKULL BASE TO THIGH TECHNIQUE: 7.83 mCi F-18 FDG was injected intravenously. Full-ring PET imaging was performed from the skull base to thigh after the radiotracer. CT data was obtained and used for attenuation correction and anatomic localization. Fasting blood glucose: 87 mg/dl COMPARISON:  Chest CT September 04, 2020. FINDINGS: Mediastinal blood pool activity: SUV max 2.41 Liver activity: SUV max not applicable NECK:  No hypermetabolic lymph nodes in the neck. Incidental CT findings: Streak artifact from dental hardware and LEFT shoulder arthroplasty does mildly limit assessment of the neck. Also limiting assessment of the upper chest with respect to LEFT shoulder arthroplasty. CHEST: Spiculated mass in the LEFT upper lobe with some surrounding ground-glass and spiculation extending to the pleural surface in the LEFT upper lobe (image 69/3) measures approximately 2.2 x 1.4 cm which is unchanged compared to recent imaging. Maximum SUV of 10.9. No signs of adenopathy in the chest. Areas of basilar atelectasis. No effusion. Airways are patent. Incidental CT findings: Calcified atheromatous plaque in the thoracic aorta without aneurysm. Moderate cardiomegaly. Signs of mitral annular calcification. No pericardial effusion. Esophagus grossly normal. Top-normal RIGHT axillary lymph node measuring 10 mm maximum SUV less than mediastinal blood pool (image 67/3) ABDOMEN/PELVIS: No abnormal hypermetabolic activity within the liver, pancreas, adrenal glands, or spleen. No hypermetabolic lymph nodes in the abdomen or pelvis. Incidental CT findings: Post cholecystectomy and gastric bypass. No acute findings related to liver, spleen, pancreas, adrenal glands, kidneys, stomach, small or large bowel. Small cyst arises from the posterior RIGHT kidney and potential angiomyolipoma measuring approximately 7 mm in the interpolar LEFT kidney (image 139/3) signs of colonic diverticulosis. Mild periumbilical stranding is noted with low level FDG uptake extending inferiorly from the umbilicus. Calcified atheromatous plaque of the abdominal aorta without aneurysmal dilation. SKELETON: No focal hypermetabolic activity to suggest skeletal metastasis. Incidental CT findings: LEFT shoulder arthroplasty. Spinal degenerative changes. IMPRESSION: 1. Hypermetabolic LEFT upper lobe nodule suspicious for bronchogenic neoplasm. No signs of metastatic disease to the  neck, chest, abdomen or pelvis. 2. Mildly enlarged RIGHT axillary lymph node without metabolic activity  above mediastinal blood pool. This may be reactive. Would also correlate with recent COVID vaccination if applicable. Consider attention on follow-up. 3. Mild periumbilical fat stranding is of uncertain significance. Correlate with any signs of cellulitis/panniculitis. 4. 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 Electronically Signed   By: Zetta Bills M.D.   On: 10/04/2020 13:04   DG Chest Port 1 View  Result Date: 09/30/2020 CLINICAL DATA:  Post biopsy EXAM: PORTABLE CHEST 1 VIEW COMPARISON:  Portable exam 1432 hours compared to 04/14/2017 Correlation: CT chest 09/04/2020 FINDINGS: Normal heart size, mediastinal contours, and pulmonary vascularity. Inter aorta Infiltrate identified in LEFT upper lobe post biopsy of known LEFT upper lobe nodule. This may reflect edema or hemorrhage at the biopsy site. Remaining lungs clear. No pleural effusion or pneumothorax. IMPRESSION: Mild infiltrate in LEFT upper lobe which may reflect mild edema or hemorrhage secondary to biopsy of known nodule. No pneumothorax. Electronically Signed   By: Lavonia Dana M.D.   On: 09/30/2020 16:10   DG C-Arm 1-60 Min-No Report  Result Date: 09/30/2020 Fluoroscopy was utilized by the requesting physician.  No radiographic interpretation.    Labs:  CBC: Recent Labs    07/22/20 1511  WBC 9.2  HGB 11.5*  HCT 33.8*  PLT 240.0    COAGS: No results for input(s): INR, APTT in the last 8760 hours.  BMP: Recent Labs    07/22/20 1511  NA 137  K 4.6  CL 104  CO2 24  GLUCOSE 270*  BUN 23  CALCIUM 9.3  CREATININE 1.30*    LIVER FUNCTION TESTS: Recent Labs    07/22/20 1511  BILITOT 0.3  AST 44*  ALT 30  ALKPHOS 77  PROT 6.1  ALBUMIN 4.1    TUMOR MARKERS: No results for input(s): AFPTM, CEA, CA199, CHROMGRNA in the last 8760 hours.  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  IR consulted for lymph node biopsy at the request of Dr. Tasia Catchings. Case reviewed by Dr. Anselm Pancoast who approves patient for procedure.  Patient presents today in their usual state of health.  She has been NPO and is not currently on blood thinners as she has held her Eliquis x2 days.   Risks and benefits was discussed with the patient and/or patient's family including, but not limited to bleeding, infection, damage to adjacent structures or low yield requiring additional tests.  All of the questions were answered and there is agreement to proceed.  Consent signed and in chart.   Thank you for this interesting consult.  I greatly enjoyed meeting Lyric Rossano and look forward to participating in their care.  A copy of this report was sent to the requesting provider on this date.  Electronically Signed: Docia Barrier, PA 10/18/2020, 12:20 PM   I spent a total of  30 Minutes   in face to face in clinical consultation, greater than 50% of which was counseling/coordinating care for lymphadenopathy.

## 2020-10-18 NOTE — Procedures (Signed)
Interventional Radiology Procedure Note  Procedure: [place] core biopsy RIGHT axillary lymph node.   Complications: None  Estimated Blood Loss: None  Recommendations: - DC home in 1 hour  Signed,  Criselda Peaches, MD

## 2020-10-22 ENCOUNTER — Encounter: Payer: Self-pay | Admitting: *Deleted

## 2020-10-22 LAB — SURGICAL PATHOLOGY

## 2020-10-31 DIAGNOSIS — Z87891 Personal history of nicotine dependence: Secondary | ICD-10-CM | POA: Diagnosis not present

## 2020-10-31 DIAGNOSIS — C3412 Malignant neoplasm of upper lobe, left bronchus or lung: Secondary | ICD-10-CM | POA: Insufficient documentation

## 2020-11-04 ENCOUNTER — Encounter: Payer: Self-pay | Admitting: Oncology

## 2020-11-05 ENCOUNTER — Encounter: Payer: Self-pay | Admitting: *Deleted

## 2020-11-05 ENCOUNTER — Telehealth: Payer: Self-pay | Admitting: Cardiology

## 2020-11-05 ENCOUNTER — Ambulatory Visit
Admission: RE | Admit: 2020-11-05 | Discharge: 2020-11-05 | Disposition: A | Discharge status: Home / Self Care | Payer: Medicare Other | Source: Ambulatory Visit | Attending: Radiation Oncology | Admitting: Radiation Oncology

## 2020-11-05 ENCOUNTER — Other Ambulatory Visit: Payer: Self-pay

## 2020-11-05 DIAGNOSIS — R4583 Excessive crying of child, adolescent or adult: Secondary | ICD-10-CM

## 2020-11-05 DIAGNOSIS — C3412 Malignant neoplasm of upper lobe, left bronchus or lung: Secondary | ICD-10-CM | POA: Diagnosis not present

## 2020-11-05 MED ORDER — ELIQUIS 5 MG PO TABS
1.0000 | ORAL_TABLET | Freq: Two times a day (BID) | ORAL | 1 refills | Status: DC
Start: 2020-11-05 — End: 2021-02-17

## 2020-11-05 MED ORDER — SERTRALINE HCL 50 MG PO TABS: 50.0000 mg | ORAL_TABLET | Freq: Every day | ORAL | 0 refills | Status: DC

## 2020-11-05 NOTE — Telephone Encounter (Signed)
Please review for refill, Thanks !  

## 2020-11-05 NOTE — Telephone Encounter (Signed)
Prescription refill request for Eliquis received. Indication: PAF Last office visit: 06/24/20  Agbor-Etang Scr: 1.3 on 10/22/20 Age: 80 Weight: 69kg  Based on above findings Eliquis $RemoveBeforeDE'5mg'[id number]$  twice daily is the appropriate dose.  Refill approved.

## 2020-11-05 NOTE — Telephone Encounter (Signed)
*  STAT* If patient is at the pharmacy, call can be transferred to refill team.   1. Which medications need to be refilled? (please list name of each medication and dose if known) Eliquis 5 MG 1 tablet 2 times daily   2. Which pharmacy/location (including street and city if local pharmacy) is [facility] sent to[facility] [place]  3. Do they need a 30 day or 90 day supply? 90 day

## 2020-11-06 ENCOUNTER — Encounter: Payer: Self-pay | Admitting: Oncology

## 2020-11-06 ENCOUNTER — Encounter: Payer: Self-pay | Admitting: Cytopathology

## 2020-11-07 ENCOUNTER — Ambulatory Visit
Admission: RE | Admit: 2020-11-07 | Discharge: 2020-11-07 | Disposition: A | Discharge status: Home / Self Care | Payer: Medicare Other | Source: Ambulatory Visit | Attending: Radiation Oncology | Admitting: Radiation Oncology

## 2020-11-07 DIAGNOSIS — C3412 Malignant neoplasm of upper lobe, left bronchus or lung: Secondary | ICD-10-CM | POA: Diagnosis not present

## 2020-11-12 ENCOUNTER — Ambulatory Visit
Admission: RE | Admit: 2020-11-12 | Discharge: 2020-11-12 | Disposition: A | Discharge status: Home / Self Care | Payer: Medicare Other | Source: Ambulatory Visit | Attending: Radiation Oncology | Admitting: Radiation Oncology

## 2020-11-12 DIAGNOSIS — C3412 Malignant neoplasm of upper lobe, left bronchus or lung: Secondary | ICD-10-CM | POA: Diagnosis not present

## 2020-11-13 ENCOUNTER — Ambulatory Visit (INDEPENDENT_AMBULATORY_CARE_PROVIDER_SITE_OTHER): Payer: Medicare Other | Admitting: Pulmonary Disease

## 2020-11-13 ENCOUNTER — Encounter: Payer: Self-pay | Admitting: Pulmonary Disease

## 2020-11-13 ENCOUNTER — Other Ambulatory Visit: Payer: Self-pay

## 2020-11-13 VITALS — BP 124/72 | HR 96 | Temp 97.8°F | Ht 64.0 in | Wt 154.0 lb

## 2020-11-13 DIAGNOSIS — C3492 Malignant neoplasm of unspecified part of left bronchus or lung: Secondary | ICD-10-CM | POA: Diagnosis not present

## 2020-11-13 DIAGNOSIS — R590 Localized enlarged lymph nodes: Secondary | ICD-10-CM

## 2020-11-13 NOTE — Patient Instructions (Signed)
Continue your therapy as directed by Dr. Baruch Gouty and Dr. Tasia Catchings.  Call with any problems with your breathing, if you start having more issues with cough etc.  We will see you in follow-up on an as-needed basis.

## 2020-11-13 NOTE — Progress Notes (Signed)
Subjective:    Patient ID: Natasha [name], female    DOB: [birth year], 80 y.o.   MRN: [id number] Chief Complaint  Patient presents with   Follow-up    Review bronch-- c/o prod cough with tan sputum and sob with exertion.     HPI Natasha [name] is an 80 year old former smoker who presents for follow-up after bronchoscopy performed 30 Sep 2020.  This is a scheduled visit.  Recall she had a left upper lobe 2.1 x 1.9 cm spiculated density noted on CT of 04 September 2020.  This was following an incidental finding during an MRI of the thoracic spine.  The patient underwent robotic bronchoscopy on 30 Sep 2020 findings were consistent with non-small cell carcinoma favor adenocarcinoma.  The patient also had an axillary lymph node on the which was FDG avid.  She underwent biopsy of the lymph node on 18 Oct 2020 and this was benign and consistent with a reactive lymph node.  He is now undergoing SBRT to the left upper lobe malignant nodule.  Her stage is a clinical T1 N0 M0.  Since her prior visit she does not endorse any new symptomatology.  She feels well and looks well.  Review of Systems A 10 point review of systems was performed and it is as noted above otherwise negative.  Patient Active Problem List   Diagnosis Date Noted   Axillary lymphadenopathy 10/11/2020   Cancer of upper lobe of left lung ([place]) 10/11/2020   Goals of care, counseling/discussion 10/11/2020   Status post reverse total shoulder replacement, 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   Diverticulitis of colon 02/03/2020   Chronic pain syndrome 01/12/2019   Neuropathic pain 01/12/2019   At high risk for falls 11/02/2018   Neuroforaminal stenosis of lumbar spine 11/02/2018   Sacroiliitis ([place]) 11/02/2018   Former smoker 09/23/2018   Spinal stenosis of lumbar region without neurogenic claudication 09/23/2018   Weakness of both hands 09/23/2018   Overweight (BMI 25.0-29.9) 08/10/2018   Insomnia 06/17/2018   Chronic kidney disease with symptom management only,  stage 3 (moderate) ([place]) 05/27/2018   Nonrheumatic aortic valve stenosis 05/24/2018   Normocytic anemia 12/24/2017   Paroxysmal atrial fibrillation ([place]) 12/24/2017   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/24/2017   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/2017   Gastroesophageal reflux disease 11/17/2016   OSA (obstructive sleep apnea) 07/24/2016   Diverticulosis 07/21/2016   Osteopenia of multiple sites 06/04/2016   Closed compression fracture of thoracic vertebra ([place]) 04/02/2015   Mixed hyperlipidemia 04/02/2015   RLS (restless legs syndrome) 04/02/2015   Closed fracture of lateral portion of left tibial plateau 04/06/2014   Essential hypertension 04/06/2014   Mild intermittent asthma without complication 33/35/4562   Type 2 diabetes mellitus with other specified complication ([place]) 56/38/9373   Social History   Tobacco Use   Smoking status: Former    Packs/day: 1.00    Years: 12.00    Pack years: 12.00    Types: Cigarettes    Quit date: 05/26/1975    Years since quitting: 45.5   Smokeless tobacco: Never  Substance Use Topics   Alcohol use: No    Comment: rarely   Allergies  Allergen Reactions   Aspirin Hives   Atorvastatin     Muscle/joint aches   Lantus [Insulin Glargine] Hives   Lisinopril Hives    Cough     Current Meds  Medication Sig   apixaban (ELIQUIS) 5 MG TABS tablet Take 1 tablet (5 mg total) by mouth 2 (two) times daily.  Calcium Carb-Cholecalciferol (CALCIUM 600+D3 PO) Take 2 tablets by mouth in the morning.   Ferrous Gluconate (IRON 27 PO) Take 27 mg by mouth in the morning.   JANUMET 50-1000 MG tablet Take 1 tablet by mouth in the morning.   Multiple Vitamin (MULTIVITAMIN WITH MINERALS) TABS tablet Take 1 tablet by mouth in the morning.   pramipexole (MIRAPEX) 1 MG tablet Take 1 tablet (1 mg total) by mouth at bedtime. Appt with PCP needed for further refills.   rosuvastatin (CRESTOR) 20 MG tablet Take 1 tablet (20 mg total) by mouth at bedtime.    sertraline (ZOLOFT) 50 MG tablet Take 1 tablet (50 mg total) by mouth at bedtime. Pt needs follow-up appt with PCP for further refills.   TRESIBA FLEXTOUCH 100 UNIT/ML SOPN FlexTouch Pen Inject 10 Units into the skin at bedtime.    vitamin E 180 MG (400 UNITS) capsule Take 400 Units by mouth daily.   Immunization History  Administered Date(s) Administered   Influenza Split 03/20/2014   Influenza, High Dose Seasonal PF 05/08/2020   Influenza,inj,Quad PF,6+ Mos 03/13/2017, 03/17/2018   Influenza-Unspecified 03/17/2012, 03/23/2013, 03/22/2014, 03/22/2015, 03/25/2016, 02/23/2019   Moderna SARS-COV2 Booster Vaccination 07/26/2019   Moderna Sars-Covid-2 Vaccination 06/28/2019, 05/14/2020   Pneumococcal Conjugate-13 10/25/2006, 05/11/2014   Pneumococcal Polysaccharide-23 10/31/2008, 04/06/2012        Objective:   Physical Exam BP 124/72 (BP Location: Left Arm, Cuff Size: Normal)   Pulse 96   Temp 97.8 F (36.6 C) (Temporal)   Ht $R'5\' 4"'lE$  (1.626 m)   Wt 154 lb (69.9 kg)   SpO2 95%   BMI 26.43 kg/m  GENERAL: Awake, alert, quite spry, fully ambulatory.  No acute distress.  No conversational dyspnea. HEAD: Normocephalic, atraumatic. EYES: Pupils equal, round, reactive to light.  No scleral icterus. MOUTH: Nose/mouth/throat not examined due to masking requirements for COVID 19. NECK: Supple. No thyromegaly. Trachea midline. No JVD.  No adenopathy. PULMONARY: Good air entry bilaterally.  No adventitious sounds. CARDIOVASCULAR: S1 and S2. Regular rate and rhythm.  Grade 1/6 to 2/6 systolic ejection murmur left sternal border. ABDOMEN: Benign. MUSCULOSKELETAL: No joint deformity, no clubbing, no edema. NEUROLOGIC: No focal deficit, no gait disturbance, speech is fluent. SKIN: Intact,warm,dry.  No rashes. PSYCH: Mood and behavior normal.  Interval studies: Pathology of RIGHT axillary lymph node obtained 5/27: Benign consistent with reactive lymph node.     Assessment & Plan:      ICD-10-CM   1. Non-small cell cancer of left lung (HCC)  C34.92    Continue management by radiation and medical oncology Follow here on an as-needed basis    2. Axillary lymphadenopathy  R59.0    Benign by biopsy 5/27     Patient is to continue follow-up with oncology.  We will see her here on an as-needed basis.  There have been no sequela from her robotic bronchoscopy.   Renold Don, MD [facility]   *This note was dictated using voice recognition software/Dragon.  Despite best efforts to proofread, errors can occur which can change the meaning.  Any change was purely unintentional.

## 2020-11-14 ENCOUNTER — Ambulatory Visit
Admission: RE | Admit: 2020-11-14 | Discharge: 2020-11-14 | Disposition: A | Discharge status: Home / Self Care | Payer: Medicare Other | Source: Ambulatory Visit | Attending: Radiation Oncology | Admitting: Radiation Oncology

## 2020-11-14 DIAGNOSIS — C3412 Malignant neoplasm of upper lobe, left bronchus or lung: Secondary | ICD-10-CM | POA: Diagnosis not present

## 2020-11-19 ENCOUNTER — Telehealth: Payer: Self-pay | Admitting: Family Medicine

## 2020-11-19 ENCOUNTER — Ambulatory Visit
Admission: RE | Admit: 2020-11-19 | Discharge: 2020-11-19 | Disposition: A | Discharge status: Home / Self Care | Payer: Medicare Other | Source: Ambulatory Visit | Attending: Radiation Oncology | Admitting: Radiation Oncology

## 2020-11-19 DIAGNOSIS — C3412 Malignant neoplasm of upper lobe, left bronchus or lung: Secondary | ICD-10-CM | POA: Diagnosis not present

## 2020-11-19 NOTE — Telephone Encounter (Signed)
Erroneous encounter

## 2020-11-27 ENCOUNTER — Encounter: Payer: Self-pay | Admitting: Family Medicine

## 2020-11-27 ENCOUNTER — Other Ambulatory Visit: Payer: Self-pay

## 2020-11-27 ENCOUNTER — Ambulatory Visit (INDEPENDENT_AMBULATORY_CARE_PROVIDER_SITE_OTHER): Payer: Medicare Other | Admitting: Family Medicine

## 2020-11-27 VITALS — BP 122/70 | HR 76 | Temp 98.1°F | Ht 64.0 in | Wt 153.5 lb

## 2020-11-27 DIAGNOSIS — L309 Dermatitis, unspecified: Secondary | ICD-10-CM | POA: Insufficient documentation

## 2020-11-27 DIAGNOSIS — R208 Other disturbances of skin sensation: Secondary | ICD-10-CM

## 2020-11-27 DIAGNOSIS — G2581 Restless legs syndrome: Secondary | ICD-10-CM | POA: Diagnosis not present

## 2020-11-27 MED ORDER — TRIAMCINOLONE ACETONIDE 0.5 % EX OINT
1.0000 | TOPICAL_OINTMENT | Freq: Two times a day (BID) | CUTANEOUS | 0 refills | Status: DC
Start: 2020-11-27 — End: 2021-02-17

## 2020-11-27 MED ORDER — PRAMIPEXOLE DIHYDROCHLORIDE 1 MG PO TABS: 1.0000 mg | ORAL_TABLET | Freq: Every day | ORAL | 3 refills | Status: AC

## 2020-11-27 NOTE — Assessment & Plan Note (Signed)
Unable to decrease mirapex or improve with non-medication approaches. Ok to continue mirapex 1 mg

## 2020-11-27 NOTE — Assessment & Plan Note (Signed)
Advised triamcinolone ointment. Avoid scratching. Return if not improving

## 2020-11-27 NOTE — Assessment & Plan Note (Signed)
Loss of sensation along T1 with nerve pain of uncertain etiology. Radiation oncology does not feel it is related though not clear. Also with pain on the left sided chest wall. Advise neurology referral for additional evaluation/work-up. Discussed if pain worsens could try gabapentin. Call back if weakness or progression.

## 2020-11-27 NOTE — Patient Instructions (Addendum)
#  rash - apply cream 2 times daily to rash on back - if no improvement after 2 weeks - call back  #Nerve pain - neurology referral - if worsening pain - can try gabapentin - if worsening - weakness, area of numbness spreading  - can try capsicin cream as well

## 2020-11-27 NOTE — Progress Notes (Signed)
Subjective:     Natasha [name] is a 80 y.o. female presenting for Arm Pain (Inner L, numb and painful x 2 weeks ) and Mass (2 on back that are painful and red)     HPI  #Red lesions - a few weeks - itchy - painful - no treatment  #Arm - numbness on the skin of the left upper arm - also having pain on the "inside" - checked with radiation doctor to see if was related - hx of shoulder replacement - in [place] - also has a spot in the armpit that the radiation dr thought might be a lymph node - radiation for lung cancer - with nothing in the nodes on the right side - it is responding to treatment  - numbness for 2 weeks - burning pain in the arm - no rashes coming up - would get burning sensations in the arm which stopped - will sleep on her side and the arm pain will wake her up - strength is the same - denies shoulder pain - not worse with movement or lifting  #restless legs - no other treatment worked - only responds to mirapex    Review of Systems   Social History   Tobacco Use  Smoking Status Former   Packs/day: 1.00   Years: 12.00   Pack years: 12.00   Types: Cigarettes   Quit date: 05/26/1975   Years since quitting: 45.5  Smokeless Tobacco Never        Objective:    BP Readings from Last 3 Encounters:  11/27/20 122/70  11/13/20 124/72  10/18/20 121/60   Wt Readings from Last 3 Encounters:  11/27/20 153 lb 8 oz (69.6 kg)  11/13/20 154 lb (69.9 kg)  10/11/20 148 lb 8 oz (67.4 kg)    BP 122/70   Pulse 76   Temp 98.1 F (36.7 C) (Temporal)   Ht $R'5\' 4"'Vd$  (1.626 m)   Wt 153 lb 8 oz (69.6 kg)   SpO2 97%   BMI 26.35 kg/m    Physical Exam Constitutional:      General: She is not in acute distress.    Appearance: She is well-developed. She is not diaphoretic.  HENT:     Right Ear: External ear normal.     Left Ear: External ear normal.     Nose: Nose normal.  Eyes:     Conjunctiva/sclera: Conjunctivae normal.  Cardiovascular:     Rate  and Rhythm: Normal rate and regular rhythm.     Heart sounds: Murmur heard.  Pulmonary:     Effort: Pulmonary effort is normal. No respiratory distress.     Breath sounds: Normal breath sounds. No wheezing.  Chest:  Breasts:    Left: No axillary adenopathy.    Musculoskeletal:     Cervical back: Neck supple.     Comments: Normal bicep/triceps strength Decreased sensation in T1 distribution on the left UE - no light touch or cold present. Able to distinguish pressure. No rashes  Normal elbow rom Scar on the left shoulder  Lymphadenopathy:     Upper Body:     Left upper body: No axillary adenopathy.  Skin:    General: Skin is warm and dry.     Capillary Refill: Capillary refill takes less than 2 seconds.     Comments: Raised scaly rash on the mid thoracic back with some areas of excoriations  Neurological:     Mental Status: She is alert. Mental status is at baseline.  Psychiatric:        Mood and Affect: Mood normal.        Behavior: Behavior normal.          Assessment & Plan:   Problem List Items Addressed This Visit       Musculoskeletal and Integument   Dermatitis    Advised triamcinolone ointment. Avoid scratching. Return if not improving         Other   Restless leg syndrome    Unable to decrease mirapex or improve with non-medication approaches. Ok to continue mirapex 1 mg       Relevant Medications   pramipexole (MIRAPEX) 1 MG tablet   Decreased sensation - Primary    Loss of sensation along T1 with nerve pain of uncertain etiology. Radiation oncology does not feel it is related though not clear. Also with pain on the left sided chest wall. Advise neurology referral for additional evaluation/work-up. Discussed if pain worsens could try gabapentin. Call back if weakness or progression.        Relevant Orders   Ambulatory referral to Neurology     Return if symptoms worsen or fail to improve.  Lesleigh Noe, MD  This visit occurred during the  SARS-CoV-2 public health emergency.  Safety protocols were in place, including screening questions prior to the visit, additional usage of staff PPE, and extensive cleaning of exam room while observing appropriate contact time as indicated for disinfecting solutions.

## 2020-12-16 ENCOUNTER — Other Ambulatory Visit: Payer: Self-pay

## 2020-12-16 NOTE — Telephone Encounter (Signed)
Called and asked patient if she currently used a CPAP. Pt states her supplier was in [place], and he CPAP was part of the [employer] recall. Pt states she will bring her SD tomorrow for her visit.

## 2020-12-17 ENCOUNTER — Other Ambulatory Visit: Payer: Self-pay

## 2020-12-17 ENCOUNTER — Ambulatory Visit (INDEPENDENT_AMBULATORY_CARE_PROVIDER_SITE_OTHER): Payer: Medicare Other | Admitting: Adult Health

## 2020-12-17 ENCOUNTER — Encounter: Payer: Self-pay | Admitting: Adult Health

## 2020-12-17 VITALS — BP 140/80 | HR 70 | Temp 98.3°F | Ht 64.0 in | Wt 155.4 lb

## 2020-12-17 DIAGNOSIS — G4733 Obstructive sleep apnea (adult) (pediatric): Secondary | ICD-10-CM

## 2020-12-17 NOTE — Assessment & Plan Note (Signed)
Original sleep study results have been requested.  We will establish with local DME , CPAP download requested.  She reports good compliance and control   Plan  Patient Instructions  Continue on CPAP At bedtime  .  Do not drive if sleepy  CPAP download.  Order for supplies and to establish with DME .  Follow up in 6 months and As needed

## 2020-12-17 NOTE — Patient Instructions (Signed)
Continue on CPAP At bedtime  .  Do not drive if sleepy  CPAP download.  Order for supplies and to establish with DME .  Follow up in 6 months and As needed

## 2020-12-17 NOTE — Progress Notes (Signed)
Very short  $Remo'@Patient'UCPuM$  ID: Natasha [name], female    DOB: 10/16/1940, 80 y.o.   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  Patient was diagnosed with lung cancer April 2022.  She has completed her full course of radiation therapy.  Patient says she did well during radiation. She denies any increased cough congestion or hemoptysis.  Patient would [name] to establish for sleep apnea.  Patient says she has had sleep apnea for over 25 years.  This was initially diagnosed in [place] area.  She says she has been on CPAP for years.  She said recently she received her CPAP about 2 to 3 years ago but was part of the recall with [employer].  She received her new machine.  And since moving to [place] she needs to establish with a new DME company.  Prior to her new CPAP machine she did use the so clean device.  But she has no longer use this and does not use this with her new machine.  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  She lost 90 pounds.  She says she has been doing well with this keeping her weight down.  She says she wears her CPAP every night cannot go without it.  She is on CPAP 6 cm H2O.  She says she feels good and rested.  Usually gets in about 8 hours of sleep each night.  Occasionally takes a nap if she does she wears her CPAP machine. She denies any significant caffeine intake. Denies any symptoms suspicious for cataplexy, sleep paralysis or teeth grinding.     Allergies  Allergen Reactions   Aspirin Hives   Atorvastatin     Muscle/joint aches   Lantus [Insulin Glargine] Hives   Lisinopril Hives    Cough       Immunization History  Administered Date(s) Administered   Influenza Split 03/20/2014   Influenza, High Dose Seasonal PF 05/08/2020   Influenza,inj,Quad PF,6+ Mos 03/13/2017, 03/17/2018   Influenza-Unspecified 03/17/2012, 03/23/2013, 03/22/2014, 03/22/2015, 03/25/2016, 02/23/2019   Moderna SARS-COV2 Booster Vaccination 07/26/2019   Moderna Sars-Covid-2 Vaccination 06/28/2019, 05/14/2020   Pneumococcal Conjugate-13 10/25/2006, 05/11/2014   Pneumococcal Polysaccharide-23 10/31/2008, 04/06/2012    Past Medical History:  Diagnosis Date   A-fib ([place])    Anemia    Aortic atherosclerosis (HCC)    Aortic stenosis, mild    Asthma    mild   Chronic cough    CKD (chronic kidney disease), stage III (HCC)    Diverticulitis    High cholesterol    History of hiatal hernia    Hypertension    Insomnia    Murmur    Restless leg    Sleep apnea    T2DM (type 2 diabetes mellitus) ([place])     Tobacco History: Social History   Tobacco Use  Smoking Status Former   Packs/day: 1.00   Years: 12.00   Pack years: 12.00   Types: Cigarettes   Quit date: 05/26/1975   Years since quitting: 45.5  Smokeless Tobacco Never   Counseling given: Not Answered   Outpatient Medications Prior to Visit  Medication Sig Dispense Refill   apixaban (ELIQUIS) 5 MG TABS tablet Take 1 tablet (5 mg total) by mouth 2 (two) times daily. 180 tablet 1   Calcium Carb-Cholecalciferol (CALCIUM 600+D3 PO) Take 2 tablets by mouth in the morning.     Ferrous Gluconate (IRON 27 PO) Take 27 mg by mouth in the morning.     JANUMET 50-1000 MG tablet Take 1 tablet by mouth in the morning.     Multiple Vitamin (MULTIVITAMIN WITH MINERALS) TABS tablet Take 1 tablet by mouth in the morning.     pramipexole (MIRAPEX) 1 MG tablet Take 1 tablet (1 mg total) by mouth at bedtime. Appt with PCP needed for further refills. 90 tablet 3   rosuvastatin (CRESTOR) 20 MG tablet Take 1 tablet (20 mg total) by mouth at bedtime. 90 tablet 3    sertraline (ZOLOFT) 50 MG tablet Take 1 tablet (50 mg total) by mouth at bedtime. Pt needs follow-up appt with PCP for further refills. 90 tablet 0   TRESIBA FLEXTOUCH 100 UNIT/ML SOPN FlexTouch Pen Inject 10 Units into the skin at bedtime.      vitamin E 180 MG (400 UNITS) capsule Take 400 Units by mouth daily.     triamcinolone ointment (KENALOG) 0.5 % Apply 1 application topically 2 (two) times daily. 30 g 0   No facility-administered medications prior to visit.     Review of Systems:   Constitutional:   No  weight loss, night sweats,  Fevers, chills, fatigue, or  lassitude.  HEENT:   No headaches,  Difficulty swallowing,  Tooth/dental problems, or  Sore throat,                No sneezing, itching, ear ache, nasal congestion, post nasal drip,   CV:  No chest pain,  Orthopnea, PND, swelling in lower extremities, anasarca, dizziness, palpitations, syncope.   GI  No heartburn, indigestion, abdominal pain, nausea, vomiting, diarrhea, change in bowel habits, loss of appetite, bloody stools.   Resp: No shortness of breath with exertion or at rest.  No excess mucus, no productive cough,  No non-productive cough,  No coughing up of blood.  No change in color of mucus.  No wheezing.  No chest wall deformity  Skin: no rash or lesions.  GU: no dysuria, change in color of urine, no urgency or frequency.  No flank pain, no hematuria   MS:  No joint pain or swelling.  No decreased range of motion.  No back pain.    Physical Exam  BP 140/80 (BP Location: Left Arm, Patient Position: Sitting, Cuff Size: Normal)   Pulse 70   Temp 98.3 F (36.8 C) (Oral)   Ht $R'5\' 4"'Kr$  (1.626 m)   Wt 155 lb 6.4 oz (70.5 kg)   SpO2 97%   BMI 26.67 kg/m   GEN: A/Ox3; pleasant , NAD, well nourished    HEENT:  [place]/AT,   NOSE-clear, THROAT-clear, no lesions, no postnasal drip or exudate noted.  Class 2 MP airway   NECK:  Supple w/ fair ROM; no JVD; normal carotid impulses w/o bruits; no thyromegaly or nodules  palpated; no lymphadenopathy.    RESP  Clear  P & A; w/o, wheezes/ rales/ or rhonchi. no accessory muscle use, no dullness to percussion  CARD:  RRR, no m/r/g, no peripheral edema, pulses intact, no cyanosis or clubbing.  GI:   Soft & nt; nml bowel sounds; no organomegaly or masses detected.   Musco: Warm bil, no deformities or  joint swelling noted.   Neuro: alert, no focal deficits noted.    Skin: Warm, no lesions or rashes    Lab Results:  CBC     BNP No results found for: BNP  ProBNP No results found for: PROBNP  Imaging: No results found.    No flowsheet data found.  No results found for: NITRICOXIDE      Assessment & Plan:   OSA (obstructive sleep apnea) Original sleep study results have been requested.  We will establish with local DME , CPAP download requested.  She reports good compliance and control   Plan  Patient Instructions  Continue on CPAP At bedtime  .  Do not drive if sleepy  CPAP download.  Order for supplies and to establish with DME .  Follow up in 6 months and As needed         I spent   30 minutes dedicated to the care of this patient on the date of this encounter to include pre-visit review of records, face-to-face time with the patient discussing conditions above, post visit ordering of testing, clinical documentation with the electronic health record, making appropriate referrals as documented, and communicating necessary findings to members of the patients care team.    Rexene Edison, NP 12/17/2020

## 2020-12-24 NOTE — Progress Notes (Signed)
Agree with the details of the visit as noted by Tammy Parrett, NP.  C. Laura Rhilyn Battle, MD [facility] 

## 2021-01-09 ENCOUNTER — Encounter: Payer: Self-pay | Admitting: Radiation Oncology

## 2021-01-09 ENCOUNTER — Ambulatory Visit
Admission: RE | Admit: 2021-01-09 | Discharge: 2021-01-09 | Disposition: A | Discharge status: Home / Self Care | Payer: Medicare Other | Source: Ambulatory Visit | Attending: Radiation Oncology | Admitting: Radiation Oncology

## 2021-01-09 ENCOUNTER — Other Ambulatory Visit: Payer: Self-pay | Admitting: Licensed Clinical Social Worker

## 2021-01-09 VITALS — BP 130/71 | HR 92 | Temp 97.7°F | Wt 155.0 lb

## 2021-01-09 DIAGNOSIS — C3412 Malignant neoplasm of upper lobe, left bronchus or lung: Secondary | ICD-10-CM

## 2021-01-09 DIAGNOSIS — Z79899 Other long term (current) drug therapy: Secondary | ICD-10-CM | POA: Insufficient documentation

## 2021-01-09 NOTE — Progress Notes (Signed)
Radiation Oncology Follow up Note  Name: Natasha [name]   Date:   01/09/2021 MRN:  [id number] DOB: Mar 05, 1941    This 80 y.o. female presents to the clinic today for 1 month follow-up status post SBRT to her left upper lobe for stage I non-small cell lung cancer favoring adenocarcinoma.  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  She is having no dysphagia at this time..  COMPLICATIONS OF TREATMENT: none  FOLLOW UP COMPLIANCE: keeps appointments   PHYSICAL EXAM:  BP 130/71   Pulse 92   Temp 97.7 F (36.5 C) (Tympanic)   Wt 155 lb (70.3 kg)   BMI 26.61 kg/m  Well-developed well-nourished patient in NAD. HEENT reveals PERLA, EOMI, discs not visualized.  Oral cavity is clear. No oral mucosal lesions are identified. Neck is clear without evidence of cervical or supraclavicular adenopathy. Lungs are clear to A&P. Cardiac examination is essentially unremarkable with regular rate and rhythm without murmur rub or thrill. Abdomen is benign with no organomegaly or masses noted. Motor sensory and DTR levels are equal and symmetric in the upper and lower extremities. Cranial nerves II through XII are grossly intact. Proprioception is intact. No peripheral adenopathy or edema is identified. No motor or sensory levels are noted. Crude visual fields are within normal range.  RADIOLOGY RESULTS: No current films to review  PLAN: Present time of asked her to start Mucinex over-the-counter.  I have explained to her this is resolution of the necrotic lung cancer that is being cleared by her cough from her lungs.  I have asked to see her back in 3 months with a CT scan prior to that visit.  Patient is to call if any of her problems  exacerbate.  I would like to take this opportunity to thank you for allowing me to participate in the care of your patient.Noreene Filbert, MD

## 2021-01-11 ENCOUNTER — Other Ambulatory Visit: Payer: Self-pay | Admitting: Family Medicine

## 2021-01-11 DIAGNOSIS — R4583 Excessive crying of child, adolescent or adult: Secondary | ICD-10-CM

## 2021-01-17 ENCOUNTER — Other Ambulatory Visit: Payer: Self-pay

## 2021-01-17 DIAGNOSIS — E782 Mixed hyperlipidemia: Secondary | ICD-10-CM

## 2021-01-17 MED ORDER — ROSUVASTATIN CALCIUM 20 MG PO TABS: 20.0000 mg | ORAL_TABLET | Freq: Every day | ORAL | 3 refills | Status: DC

## 2021-02-17 ENCOUNTER — Telehealth (INDEPENDENT_AMBULATORY_CARE_PROVIDER_SITE_OTHER): Payer: Medicare Other | Admitting: Family Medicine

## 2021-02-17 VITALS — BP 159/94 | HR 92 | Wt 156.0 lb

## 2021-02-17 DIAGNOSIS — E1169 Type 2 diabetes mellitus with other specified complication: Secondary | ICD-10-CM | POA: Diagnosis not present

## 2021-02-17 DIAGNOSIS — I480 Paroxysmal atrial fibrillation: Secondary | ICD-10-CM

## 2021-02-17 DIAGNOSIS — M5442 Lumbago with sciatica, left side: Secondary | ICD-10-CM | POA: Diagnosis not present

## 2021-02-17 DIAGNOSIS — Z794 Long term (current) use of insulin: Secondary | ICD-10-CM | POA: Diagnosis not present

## 2021-02-17 DIAGNOSIS — G8929 Other chronic pain: Secondary | ICD-10-CM | POA: Insufficient documentation

## 2021-02-17 DIAGNOSIS — M5441 Lumbago with sciatica, right side: Secondary | ICD-10-CM

## 2021-02-17 MED ORDER — APIXABAN 5 MG PO TABS: 5.0000 mg | ORAL_TABLET | Freq: Two times a day (BID) | ORAL | 1 refills | Status: DC

## 2021-02-17 MED ORDER — GLUCOSE BLOOD VI STRP: ORAL_STRIP | 12 refills | Status: DC

## 2021-02-17 NOTE — Assessment & Plan Note (Signed)
Poorly controlled. Followed by endo. Last hgbac1 >10. Refill test strips. Cont janumet and tresiba. Appreciate endo support

## 2021-02-17 NOTE — Assessment & Plan Note (Signed)
Follows with cardiology. Cont apixaban 5 mg bid.

## 2021-02-17 NOTE — Progress Notes (Signed)
I connected with Natasha [name] on 02/17/21 at 12:00 PM EDT by video and verified that I am speaking with the correct person using two identifiers.   I discussed the limitations, risks, security and privacy concerns of performing an evaluation and management service by video and the availability of in person appointments. I also discussed with the patient that there may be a patient responsible charge related to this service. The patient expressed understanding and agreed to proceed.  Patient location: Home Provider Location: [facility] Participants: Natasha [name] and Natasha [name]   Subjective:     Natasha [name] is a 80 y.o. female presenting for Back Pain     HPI  #Back Pain - persisting - has had injection - took 6 weeks to work and lasted 30 days - physical therapy - got worse - has seen ortho and neurology - has lung cancer with terrible cough - and this worsens her back pain - pain initially started - 1 year ago - Has seen ortho - Dr. Verdie Mosher - herniated disc  - is contemplating surgery  - pain is worse and more frequent - no trauma but fast progression of getting worse - no imaging since April - has appt November 17th - and getting chest CT there - pain is the lumbar spine and radiates to the hips - Treatment: tylenol - other medications do not seem to work  - does not think that she is tried steroid - injection was   Review of Systems   Social History   Tobacco Use  Smoking Status Former   Packs/day: 1.00   Years: 12.00   Pack years: 12.00   Types: Cigarettes   Quit date: 05/26/1975   Years since quitting: 45.7  Smokeless Tobacco Never        Objective:   BP Readings from Last 3 Encounters:  02/17/21 (!) 159/94  01/09/21 130/71  12/17/20 140/80   Wt Readings from Last 3 Encounters:  02/17/21 156 lb (70.8 kg)  01/09/21 155 lb (70.3 kg)  12/17/20 155 lb 6.4 oz (70.5 kg)   BP (!) 159/94   Pulse 92   Wt 156 lb (70.8 kg)   BMI 26.78  kg/m   Physical Exam Constitutional:      Appearance: Normal appearance. She is not ill-appearing.  HENT:     Head: Normocephalic and atraumatic.     Right Ear: External ear normal.     Left Ear: External ear normal.  Eyes:     Conjunctiva/sclera: Conjunctivae normal.  Pulmonary:     Effort: Pulmonary effort is normal. No respiratory distress.  Neurological:     Mental Status: She is alert. Mental status is at baseline.  Psychiatric:        Mood and Affect: Mood normal.        Behavior: Behavior normal.        Thought Content: Thought content normal.        Judgment: Judgment normal.            Assessment & Plan:   Problem List Items Addressed This Visit       Cardiovascular and Mediastinum   Paroxysmal atrial fibrillation (HCC)    Follows with cardiology. Cont apixaban 5 mg bid.       Relevant Medications   apixaban (ELIQUIS) 5 MG TABS tablet     Endocrine   Type 2 diabetes mellitus with other specified complication (HCC) - Primary    Poorly controlled. Followed by  endo. Last hgbac1 >10. Refill test strips. Cont janumet and tresiba. Appreciate endo support      Relevant Medications   glucose blood test strip     Nervous and Auditory   Chronic bilateral low back pain with bilateral sciatica    Pt with ongoing back pain last saw ortho 11/2020 and MRI done 08/2020. Discussed that as pain seems to be worsening given her cancer and osteoporosis would recommend repeat XR to evaluated for fracture/metatastasis. Unable to do exam due to virtual visit      Relevant Orders   DG Lumbar Spine Complete     Return if symptoms worsen or fail to improve.  Lesleigh Noe, MD

## 2021-02-17 NOTE — Assessment & Plan Note (Signed)
Pt with ongoing back pain last saw ortho 11/2020 and MRI done 08/2020. Discussed that as pain seems to be worsening given her cancer and osteoporosis would recommend repeat XR to evaluated for fracture/metatastasis. Unable to do exam due to virtual visit

## 2021-02-17 NOTE — Patient Instructions (Addendum)
Back pain  - Greatest concern is making sure it is not cancer - Call back if no improvement with lyrica and wanting to try prednisone (though would raise your sugars)   Get your X-Ray at [facility] [address] [place], [place] [address]

## 2021-02-24 ENCOUNTER — Other Ambulatory Visit: Payer: Self-pay

## 2021-02-24 ENCOUNTER — Telehealth: Payer: Self-pay

## 2021-02-24 ENCOUNTER — Telehealth: Payer: Self-pay | Admitting: Family Medicine

## 2021-02-24 ENCOUNTER — Inpatient Hospital Stay
Admission: EM | Admit: 2021-02-24 | Discharge: 2021-02-26 | DRG: 178 | Disposition: A | Discharge status: Home / Self Care | Payer: Medicare Other | Attending: Internal Medicine | Admitting: Internal Medicine

## 2021-02-24 ENCOUNTER — Emergency Department: Payer: Medicare Other

## 2021-02-24 DIAGNOSIS — Z7984 Long term (current) use of oral hypoglycemic drugs: Secondary | ICD-10-CM

## 2021-02-24 DIAGNOSIS — Z79899 Other long term (current) drug therapy: Secondary | ICD-10-CM | POA: Diagnosis not present

## 2021-02-24 DIAGNOSIS — Z833 Family history of diabetes mellitus: Secondary | ICD-10-CM

## 2021-02-24 DIAGNOSIS — I4892 Unspecified atrial flutter: Secondary | ICD-10-CM | POA: Diagnosis present

## 2021-02-24 DIAGNOSIS — Z96612 Presence of left artificial shoulder joint: Secondary | ICD-10-CM | POA: Diagnosis present

## 2021-02-24 DIAGNOSIS — Z87891 Personal history of nicotine dependence: Secondary | ICD-10-CM | POA: Diagnosis not present

## 2021-02-24 DIAGNOSIS — G4733 Obstructive sleep apnea (adult) (pediatric): Secondary | ICD-10-CM | POA: Diagnosis present

## 2021-02-24 DIAGNOSIS — I350 Nonrheumatic aortic (valve) stenosis: Secondary | ICD-10-CM | POA: Diagnosis present

## 2021-02-24 DIAGNOSIS — M48061 Spinal stenosis, lumbar region without neurogenic claudication: Secondary | ICD-10-CM | POA: Diagnosis present

## 2021-02-24 DIAGNOSIS — Z886 Allergy status to analgesic agent status: Secondary | ICD-10-CM

## 2021-02-24 DIAGNOSIS — I129 Hypertensive chronic kidney disease with stage 1 through stage 4 chronic kidney disease, or unspecified chronic kidney disease: Secondary | ICD-10-CM | POA: Diagnosis present

## 2021-02-24 DIAGNOSIS — G894 Chronic pain syndrome: Secondary | ICD-10-CM | POA: Diagnosis present

## 2021-02-24 DIAGNOSIS — D631 Anemia in chronic kidney disease: Secondary | ICD-10-CM | POA: Diagnosis present

## 2021-02-24 DIAGNOSIS — E1169 Type 2 diabetes mellitus with other specified complication: Secondary | ICD-10-CM | POA: Diagnosis present

## 2021-02-24 DIAGNOSIS — Z9884 Bariatric surgery status: Secondary | ICD-10-CM | POA: Diagnosis not present

## 2021-02-24 DIAGNOSIS — N183 Chronic kidney disease, stage 3 unspecified: Secondary | ICD-10-CM | POA: Diagnosis present

## 2021-02-24 DIAGNOSIS — E7800 Pure hypercholesterolemia, unspecified: Secondary | ICD-10-CM | POA: Diagnosis present

## 2021-02-24 DIAGNOSIS — R0602 Shortness of breath: Secondary | ICD-10-CM

## 2021-02-24 DIAGNOSIS — Z794 Long term (current) use of insulin: Secondary | ICD-10-CM

## 2021-02-24 DIAGNOSIS — Z23 Encounter for immunization: Secondary | ICD-10-CM

## 2021-02-24 DIAGNOSIS — Z8616 Personal history of COVID-19: Secondary | ICD-10-CM

## 2021-02-24 DIAGNOSIS — U071 COVID-19: Principal | ICD-10-CM

## 2021-02-24 DIAGNOSIS — I10 Essential (primary) hypertension: Secondary | ICD-10-CM | POA: Diagnosis present

## 2021-02-24 DIAGNOSIS — J45909 Unspecified asthma, uncomplicated: Secondary | ICD-10-CM | POA: Diagnosis present

## 2021-02-24 DIAGNOSIS — I480 Paroxysmal atrial fibrillation: Secondary | ICD-10-CM | POA: Diagnosis present

## 2021-02-24 DIAGNOSIS — Z923 Personal history of irradiation: Secondary | ICD-10-CM

## 2021-02-24 DIAGNOSIS — Z888 Allergy status to other drugs, medicaments and biological substances status: Secondary | ICD-10-CM | POA: Diagnosis not present

## 2021-02-24 DIAGNOSIS — Z7901 Long term (current) use of anticoagulants: Secondary | ICD-10-CM

## 2021-02-24 DIAGNOSIS — E1122 Type 2 diabetes mellitus with diabetic chronic kidney disease: Secondary | ICD-10-CM | POA: Diagnosis present

## 2021-02-24 DIAGNOSIS — G4700 Insomnia, unspecified: Secondary | ICD-10-CM | POA: Diagnosis present

## 2021-02-24 DIAGNOSIS — C3412 Malignant neoplasm of upper lobe, left bronchus or lung: Secondary | ICD-10-CM | POA: Diagnosis present

## 2021-02-24 HISTORY — DX: Personal history of COVID-19: Z86.16

## 2021-02-24 HISTORY — DX: Unspecified atrial flutter: I48.92

## 2021-02-24 LAB — CBC WITH DIFFERENTIAL/PLATELET
Abs Immature Granulocytes: 0.04 10*3/uL (ref 0.00–0.07)
Basophils Absolute: 0 10*3/uL (ref 0.0–0.1)
Basophils Relative: 0 %
Eosinophils Absolute: 0 10*3/uL (ref 0.0–0.5)
Eosinophils Relative: 0 %
HCT: 34.6 % — ABNORMAL LOW (ref 36.0–46.0)
Hemoglobin: 11.9 g/dL — ABNORMAL LOW (ref 12.0–15.0)
Immature Granulocytes: 1 %
Lymphocytes Relative: 15 %
Lymphs Abs: 1.2 10*3/uL (ref 0.7–4.0)
MCH: 29.5 pg (ref 26.0–34.0)
MCHC: 34.4 g/dL (ref 30.0–36.0)
MCV: 85.9 fL (ref 80.0–100.0)
Monocytes Absolute: 0.9 10*3/uL (ref 0.1–1.0)
Monocytes Relative: 12 %
Neutro Abs: 5.4 10*3/uL (ref 1.7–7.7)
Neutrophils Relative %: 72 %
Platelets: 245 10*3/uL (ref 150–400)
RBC: 4.03 MIL/uL (ref 3.87–5.11)
RDW: 13.8 % (ref 11.5–15.5)
WBC: 7.6 10*3/uL (ref 4.0–10.5)
nRBC: 0 % (ref 0.0–0.2)

## 2021-02-24 LAB — COMPREHENSIVE METABOLIC PANEL
ALT: 18 U/L (ref 0–44)
AST: 31 U/L (ref 15–41)
Albumin: 4.1 g/dL (ref 3.5–5.0)
Alkaline Phosphatase: 54 U/L (ref 38–126)
Anion gap: 4 — ABNORMAL LOW (ref 5–15)
BUN: 23 mg/dL (ref 8–23)
CO2: 25 mmol/L (ref 22–32)
Calcium: 9.1 mg/dL (ref 8.9–10.3)
Chloride: 106 mmol/L (ref 98–111)
Creatinine, Ser: 1.62 mg/dL — ABNORMAL HIGH (ref 0.44–1.00)
GFR, Estimated: 32 mL/min — ABNORMAL LOW (ref >60)
Glucose, Bld: 133 mg/dL — ABNORMAL HIGH (ref 70–99)
Potassium: 4.9 mmol/L (ref 3.5–5.1)
Sodium: 135 mmol/L (ref 135–145)
Total Bilirubin: 0.8 mg/dL (ref 0.3–1.2)
Total Protein: 6.9 g/dL (ref 6.5–8.1)

## 2021-02-24 LAB — MRSA NEXT GEN BY PCR, NASAL: MRSA by PCR Next Gen: NOT DETECTED

## 2021-02-24 LAB — FIBRINOGEN: Fibrinogen: 482 mg/dL — ABNORMAL HIGH (ref 210–475)

## 2021-02-24 LAB — D-DIMER, QUANTITATIVE: D-Dimer, Quant: 0.82 ug/mL-FEU — ABNORMAL HIGH (ref 0.00–0.50)

## 2021-02-24 LAB — GLUCOSE, CAPILLARY
Glucose-Capillary: 82 mg/dL (ref 70–99)
Glucose-Capillary: 181 mg/dL — ABNORMAL HIGH (ref 70–99)

## 2021-02-24 LAB — FERRITIN: Ferritin: 24 ng/mL (ref 11–307)

## 2021-02-24 LAB — C-REACTIVE PROTEIN: CRP: 1 mg/dL — ABNORMAL HIGH (ref <1.0)

## 2021-02-24 LAB — LACTATE DEHYDROGENASE: LDH: 140 U/L (ref 98–192)

## 2021-02-24 MED ORDER — SERTRALINE HCL 50 MG PO TABS
50.0000 mg | ORAL_TABLET | Freq: Every day | ORAL | Status: DC
  Administered 2021-02-25 (×2): 50 mg via ORAL
  Filled 2021-02-24 (×2): qty 1

## 2021-02-24 MED ORDER — SODIUM CHLORIDE 0.9 % IV BOLUS
500.0000 mL | Freq: Once | INTRAVENOUS | Status: AC
  Administered 2021-02-25: 500 mL via INTRAVENOUS

## 2021-02-24 MED ORDER — ACETAMINOPHEN 650 MG RE SUPP
650.0000 mg | Freq: Four times a day (QID) | RECTAL | Status: DC | PRN
  Filled 2021-02-24: qty 1

## 2021-02-24 MED ORDER — CONTOUR NEXT TEST VI STRP: ORAL_STRIP | 12 refills | Status: AC

## 2021-02-24 MED ORDER — APIXABAN 5 MG PO TABS
5.0000 mg | ORAL_TABLET | Freq: Two times a day (BID) | ORAL | Status: DC
  Administered 2021-02-25 – 2021-02-26 (×4): 5 mg via ORAL
  Filled 2021-02-24 (×4): qty 1

## 2021-02-24 MED ORDER — CHLORHEXIDINE GLUCONATE CLOTH 2 % EX PADS
6.0000 | MEDICATED_PAD | Freq: Every day | CUTANEOUS | Status: DC
  Administered 2021-02-24 – 2021-02-25 (×2): 6 via TOPICAL

## 2021-02-24 MED ORDER — IPRATROPIUM-ALBUTEROL 20-100 MCG/ACT IN AERS
1.0000 | INHALATION_SPRAY | Freq: Four times a day (QID) | RESPIRATORY_TRACT | Status: DC | PRN
  Filled 2021-02-24: qty 4

## 2021-02-24 MED ORDER — SODIUM CHLORIDE 0.9 % IV SOLN
100.0000 mg | Freq: Every day | INTRAVENOUS | Status: DC
  Administered 2021-02-25 – 2021-02-26 (×2): 100 mg via INTRAVENOUS
  Filled 2021-02-24 (×2): qty 100

## 2021-02-24 MED ORDER — CALCIUM CARBONATE-VITAMIN D 500-200 MG-UNIT PO TABS
1.0000 | ORAL_TABLET | Freq: Every morning | ORAL | Status: DC
Start: 2021-02-25 — End: 2021-02-26
  Administered 2021-02-25 – 2021-02-26 (×2): 1 via ORAL
  Filled 2021-02-24 (×2): qty 1

## 2021-02-24 MED ORDER — GUAIFENESIN-DM 100-10 MG/5ML PO SYRP: 10.0000 mL | ORAL_SOLUTION | ORAL | Status: DC | PRN

## 2021-02-24 MED ORDER — INSULIN ASPART 100 UNIT/ML IJ SOLN
0.0000 [IU] | Freq: Three times a day (TID) | INTRAMUSCULAR | Status: DC
  Administered 2021-02-25: 15 [IU] via SUBCUTANEOUS
  Administered 2021-02-25: 5 [IU] via SUBCUTANEOUS
  Administered 2021-02-25: 15 [IU] via SUBCUTANEOUS
  Administered 2021-02-26: 8 [IU] via SUBCUTANEOUS
  Administered 2021-02-26: 11 [IU] via SUBCUTANEOUS
  Filled 2021-02-24 (×5): qty 1

## 2021-02-24 MED ORDER — INFLUENZA VAC A&B SA ADJ QUAD 0.5 ML IM PRSY
0.5000 mL | PREFILLED_SYRINGE | INTRAMUSCULAR | Status: AC
  Administered 2021-02-26: 0.5 mL via INTRAMUSCULAR
  Filled 2021-02-24 (×2): qty 0.5

## 2021-02-24 MED ORDER — ACETAMINOPHEN 325 MG PO TABS
650.0000 mg | ORAL_TABLET | Freq: Four times a day (QID) | ORAL | Status: DC | PRN
  Administered 2021-02-25: 650 mg via ORAL
  Filled 2021-02-24: qty 2

## 2021-02-24 MED ORDER — ADULT MULTIVITAMIN W/MINERALS CH
1.0000 | ORAL_TABLET | Freq: Every day | ORAL | Status: DC
  Administered 2021-02-24 – 2021-02-26 (×3): 1 via ORAL
  Filled 2021-02-24 (×3): qty 1

## 2021-02-24 MED ORDER — METOPROLOL TARTRATE 5 MG/5ML IV SOLN
5.0000 mg | Freq: Once | INTRAVENOUS | Status: AC
  Administered 2021-02-24: 5 mg via INTRAVENOUS
  Filled 2021-02-24: qty 5

## 2021-02-24 MED ORDER — IOHEXOL 350 MG/ML SOLN
60.0000 mL | Freq: Once | INTRAVENOUS | Status: AC | PRN
  Administered 2021-02-24: 60 mL via INTRAVENOUS

## 2021-02-24 MED ORDER — ONDANSETRON HCL 4 MG PO TABS: 4.0000 mg | ORAL_TABLET | Freq: Four times a day (QID) | ORAL | Status: DC | PRN

## 2021-02-24 MED ORDER — ROSUVASTATIN CALCIUM 10 MG PO TABS
20.0000 mg | ORAL_TABLET | Freq: Every day | ORAL | Status: DC
  Administered 2021-02-25 (×2): 20 mg via ORAL
  Filled 2021-02-24 (×2): qty 2

## 2021-02-24 MED ORDER — SODIUM CHLORIDE 0.9 % IV SOLN
200.0000 mg | Freq: Once | INTRAVENOUS | Status: AC
  Administered 2021-02-24: 200 mg via INTRAVENOUS
  Filled 2021-02-24 (×2): qty 40

## 2021-02-24 MED ORDER — ONDANSETRON HCL 4 MG/2ML IJ SOLN: 4.0000 mg | Freq: Four times a day (QID) | INTRAMUSCULAR | Status: DC | PRN

## 2021-02-24 MED ORDER — PREGABALIN 25 MG PO CAPS
25.0000 mg | ORAL_CAPSULE | Freq: Two times a day (BID) | ORAL | Status: DC
  Administered 2021-02-25 – 2021-02-26 (×4): 25 mg via ORAL
  Filled 2021-02-24 (×4): qty 1

## 2021-02-24 MED ORDER — DILTIAZEM HCL-DEXTROSE 125-5 MG/125ML-% IV SOLN (PREMIX)
5.0000 mg/h | INTRAVENOUS | Status: DC
  Administered 2021-02-24: 5 mg/h via INTRAVENOUS
  Filled 2021-02-24: qty 125

## 2021-02-24 MED ORDER — PRAMIPEXOLE DIHYDROCHLORIDE 0.25 MG PO TABS
1.0000 mg | ORAL_TABLET | Freq: Every day | ORAL | Status: DC
  Administered 2021-02-25 (×2): 1 mg via ORAL
  Filled 2021-02-24 (×3): qty 1

## 2021-02-24 MED ORDER — INSULIN ASPART 100 UNIT/ML IJ SOLN
0.0000 [IU] | Freq: Every day | INTRAMUSCULAR | Status: DC
  Filled 2021-02-24: qty 1

## 2021-02-24 MED ORDER — DEXAMETHASONE SODIUM PHOSPHATE 10 MG/ML IJ SOLN
6.0000 mg | INTRAMUSCULAR | Status: DC
  Administered 2021-02-24 – 2021-02-25 (×2): 6 mg via INTRAVENOUS
  Filled 2021-02-24 (×2): qty 0.6

## 2021-02-24 NOTE — Consult Note (Signed)
Remdesivir - Pharmacy Brief Note   O:  ALT: 31 CXR: No active cardiopulmonary disease SpO2: 94% on RA   A/P:  Remdesivir 200 mg IVPB once followed by 100 mg IVPB daily x 4 days.   Pearla Dubonnet, PharmD Clinical Pharmacist 02/24/2021 6:04 PM

## 2021-02-24 NOTE — Telephone Encounter (Signed)
Per chart review tab pt is at [facility] ED and was registered at ED at 2:05 pm. Sending note to Dr Einar Pheasant.

## 2021-02-24 NOTE — Telephone Encounter (Signed)
[place] [place] [facility] - Client TELEPHONE ADVICE RECORD [facility] Patient Name: Natasha [name] [facility] LIEF Gender: Female DOB: 1941/04/09 Age: 80 Y 64 M 22 D Return Phone Number: [phone] (Primary) Address: City/ State/ Zip: [place] [address] Client [place] [facility] - Client Client Site [place] - Day Physician Waunita Schooner- MD Contact Type Call Who Is Calling Patient / Member / Family / Caregiver Call Type Triage / Clinical Relationship To Patient Self Return Phone Number [phone] (Primary) Chief Complaint BREATHING - shortness of breath or sounds breathless Reason for Call Symptomatic / Request for Health Information Initial Comment Transferred from office. PT is positive for Covid, having chills, cough and shortness of breath. Translation No Nurse Assessment Nurse: Raenette Rover, RN, Zella Ball Date/Time (Eastern Time): 02/24/2021 1:34:05 PM Confirm and document reason for call. If symptomatic, describe symptoms. ---Caller has lung cancer and her husband has had it as well and was hospitalized for it. Symptoms started last night with chills. Didn't check temperature. Does the patient have any new or worsening symptoms? ---Yes Will a triage be completed? ---Yes Related visit to physician within the last 2 weeks? ---No Does the PT have any chronic conditions? (i.e. diabetes, asthma, this includes High risk factors for pregnancy, etc.) ---Yes List chronic conditions. ---diabetic, arthritis, Lung cancer. Is this a behavioral health or substance abuse call? ---No Guidelines Guideline Title Affirmed Question Affirmed Notes Nurse Date/Time (Eastern Time) COVID-19 - Diagnosed or Suspected MODERATE difficulty breathing (e.g., speaks in phrases, SOB even at rest, pulse 100-120) Raenette Rover, RN, Zella Ball 02/24/2021 1:35:55 PM Disp. Time Eilene Ghazi Time) Disposition Final User 02/24/2021 1:30:42 PM Send to Urgent [place] NOTE: All timestamps contained within this report are represented as [place] Standard Time. CONFIDENTIALTY NOTICE: This fax transmission is intended only for the addressee. It contains information that is legally privileged, confidential or otherwise protected from use or disclosure. If you are not the intended recipient, you are strictly prohibited from reviewing, disclosing, copying using or disseminating any of this information or taking any action in reliance on or regarding this information. If you have received this fax in error, please notify us immediately by telephone so that we can arrange for its return to [place]. Phone: [phone], Toll-Free: [phone], Fax: [phone] Page: 2 of 2 Call Id: [id number] 02/24/2021 1:40:33 PM Go to ED Now Yes Raenette Rover, RN, Herbert Deaner Disagree/Comply Comply Caller Understands Yes PreDisposition InappropriateToAsk Care Advice Given Per Guideline GO TO ED NOW: * Leave now. Drive carefully. WEAR A MASK - COVER YOUR MOUTH AND NOSE: ANOTHER ADULT SHOULD DRIVE: CALL EMS [phone] IF: * Severe difficulty breathing occurs * Lips or face turns blue * Confusion occurs. CARE ADVICE given per COVID-19 - DIAGNOSED OR SUSPECTED (Adult) guideline. Comments User: Wilson Singer, RN Date/Time Eilene Ghazi Time): 02/24/2021 1:37:27 PM Caller said her doctor told her when her husband had covid if she gets it to call cause she wants to have her meds given IV. User: Wilson Singer, RN Date/Time Eilene Ghazi Time): 02/24/2021 1:38:47 PM hx of afib User: Wilson Singer, RN Date/Time Eilene Ghazi Time): 02/24/2021 1:41:56 PM Caller sob just talking on the phone and walked a few steps to the mirror to check her color and came back to the phone very sob. Referrals [facility] - ED

## 2021-02-24 NOTE — Plan of Care (Signed)
  Problem: Education: Goal: Knowledge of General Education information will improve Description: Including pain rating scale, medication(s)/side effects and non-pharmacologic comfort measures Outcome: Progressing   Problem: Health Behavior/Discharge Planning: Goal: Ability to manage health-related needs will improve Outcome: Progressing   Problem: Clinical Measurements: Goal: Ability to maintain clinical measurements within normal limits will improve Outcome: Progressing Goal: Will remain free from infection Outcome: Progressing Goal: Diagnostic test results will improve Outcome: Progressing Goal: Respiratory complications will improve Outcome: Progressing Goal: Cardiovascular complication will be avoided Outcome: Progressing   Problem: Activity: Goal: Risk for activity intolerance will decrease Outcome: Progressing   Problem: Nutrition: Goal: Adequate nutrition will be maintained Outcome: Progressing   Problem: Coping: Goal: Level of anxiety will decrease Outcome: Progressing   Problem: Elimination: Goal: Will not experience complications related to bowel motility Outcome: Progressing Goal: Will not experience complications related to urinary retention Outcome: Progressing   Problem: Pain Managment: Goal: General experience of comfort will improve Outcome: Progressing   Problem: Safety: Goal: Ability to remain free from injury will improve Outcome: Progressing   Problem: Skin Integrity: Goal: Risk for impaired skin integrity will decrease Outcome: Progressing  Patient alert able to make all needs known call light in reach pt on room air no pain noted

## 2021-02-24 NOTE — ED Notes (Signed)
Informed RN bed assigned 

## 2021-02-24 NOTE — ED Triage Notes (Signed)
Pt c/o chills, SOB , fever since last night, states she took a home covid test last night and again this morning that was positive.

## 2021-02-24 NOTE — Telephone Encounter (Signed)
Pt is currently at ED for SOB

## 2021-02-24 NOTE — ED Provider Notes (Signed)
[facility] Emergency Department Provider Note   ____________________________________________   Event Date/Time   First MD Initiated Contact with Patient 02/24/21 1455     (approximate)  I have reviewed the triage vital signs and the nursing notes.   HISTORY  Chief Complaint Shortness of Breath    HPI Natasha Accardi is a 80 y.o. female who presents for chills, shortness of breath, fever, and positive home COVID test  LOCATION: Chest, generalized DURATION: 1 day prior to arrival TIMING: Worsening since onset SEVERITY: Severe QUALITY: Shortness of breath, chills, fever CONTEXT: Patient states that she began having shaking chills last night and found to have a fever before taking a home COVID test that came back positive.  Patient also states that she has had worsening shortness of breath over that time as well MODIFYING FACTORS: The symptoms are worsened with exertion and partially relieved at rest ASSOCIATED SYMPTOMS: Fever/chills, palpitations   Per medical record review, patient has a history of A. fib          Past Medical History:  Diagnosis Date   A-fib (HCC)    Anemia    Aortic atherosclerosis (HCC)    Aortic stenosis, mild    Asthma    mild   Chronic cough    CKD (chronic kidney disease), stage III (HCC)    Diverticulitis    High cholesterol    History of hiatal hernia    Hypertension    Insomnia    Murmur    Restless leg    Sleep apnea    T2DM (type 2 diabetes mellitus) (HCC)     Patient Active Problem List   Diagnosis Date Noted   COVID-19 virus infection 02/24/2021   Atrial flutter (HCC) 02/24/2021   Chronic bilateral low back pain with bilateral sciatica 02/17/2021   Decreased sensation 11/27/2020   Dermatitis 11/27/2020   Axillary lymphadenopathy 10/11/2020   Cancer of upper lobe of left lung (HCC) 10/11/2020   Goals of care, counseling/discussion 10/11/2020   Status post reverse total shoulder replacement, 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   Diverticulitis of colon 02/03/2020   Chronic pain syndrome 01/12/2019   Neuropathic pain 01/12/2019   At high risk for falls 11/02/2018   Neuroforaminal stenosis of lumbar spine 11/02/2018   Sacroiliitis (HCC) 11/02/2018   Former smoker 09/23/2018   Spinal stenosis of lumbar region without neurogenic claudication 09/23/2018   Weakness of both hands 09/23/2018   Overweight (BMI 25.0-29.9) 08/10/2018   Insomnia 06/17/2018   Chronic kidney disease with symptom management only, stage 3 (moderate) (HCC) 05/27/2018   Nonrheumatic aortic valve stenosis 05/24/2018   Normocytic anemia 12/24/2017   Paroxysmal atrial fibrillation (HCC) 12/24/2017   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/24/2017   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/2017   Gastroesophageal reflux disease 11/17/2016   OSA (obstructive sleep apnea) 07/24/2016   Diverticulosis 07/21/2016   Osteopenia of multiple sites 06/04/2016   Closed compression fracture of thoracic vertebra (HCC) 04/02/2015   Mixed hyperlipidemia 04/02/2015   Restless leg syndrome 04/02/2015   Closed fracture of lateral portion of left tibial plateau 04/06/2014   Essential hypertension 04/06/2014   Mild intermittent asthma without complication 04/06/2014   Type 2 diabetes mellitus with other specified complication (HCC) 04/06/2014    Past Surgical History:  Procedure Laterality Date   ABDOMINAL HYSTERECTOMY  1978   APPENDECTOMY  1978   BREAST BIOPSY Left ?   papilloma   BREAST CYST EXCISION Bilateral yrs ago   benign, scars not well  visualized   BREAST SURGERY     CATARACT EXTRACTION W/ INTRAOCULAR LENS  IMPLANT, BILATERAL Bilateral    CHOLECYSTECTOMY     COLONOSCOPY     ELBOW SURGERY Right    [place] release   EYE SURGERY     FRACTURE SURGERY     GASTRIC BYPASS  12/22/2017   JOINT REPLACEMENT     KNEE SURGERY Left    tibial fracture with metal plate   TOTAL SHOULDER REPLACEMENT Left 2014   VIDEO BRONCHOSCOPY WITH ENDOBRONCHIAL  NAVIGATION N/A 09/30/2020   Procedure: ROBOTIC ASSISTED VIDEO BRONCHOSCOPY WITH ENDOBRONCHIAL NAVIGATION;  Surgeon: Tyler Pita, MD;  Location: [facility] ORS;  Service: Pulmonary;  Laterality: N/A;   WRIST SURGERY Left    fractures    Prior to Admission medications   Medication Sig Start Date End Date Taking? Authorizing Provider  apixaban (ELIQUIS) 5 MG TABS tablet Take 1 tablet (5 mg total) by mouth 2 (two) times daily. 02/17/21  Yes Lesleigh Noe, MD  Calcium Carb-Cholecalciferol (CALCIUM 600+D3 PO) Take 2 tablets by mouth in the morning.   Yes [provider]  Ferrous Gluconate (IRON 27 PO) Take 27 mg by mouth in the morning.   Yes [provider]  JANUMET 50-1000 MG tablet Take 2 tablets by mouth in the morning. 02/08/17  Yes [provider]  Multiple Vitamin (MULTIVITAMIN WITH MINERALS) TABS tablet Take 1 tablet by mouth in the morning.   Yes [provider]  pramipexole (MIRAPEX) 1 MG tablet Take 1 tablet (1 mg total) by mouth at bedtime. Appt with PCP needed for further refills. 11/27/20  Yes Lesleigh Noe, MD  pregabalin (LYRICA) 25 MG capsule Take 1 capsule by mouth in the morning and at bedtime. 02/17/21  Yes [provider]  rosuvastatin (CRESTOR) 20 MG tablet Take 1 tablet (20 mg total) by mouth at bedtime. 01/17/21  Yes Lesleigh Noe, MD  sertraline (ZOLOFT) 50 MG tablet TAKE 1 TABLET AT BEDTIME. PT NEEDS FOLLOW-UP APPT WITH PCP FOR FURTHER REFILLS. 01/13/21  Yes Lesleigh Noe, MD  TRESIBA FLEXTOUCH 100 UNIT/ML SOPN FlexTouch Pen Inject 15 Units into the skin at bedtime. 03/26/17  Yes [provider]  vitamin E 180 MG (400 UNITS) capsule Take 400 Units by mouth daily.   Yes [provider]  glucose blood (CONTOUR NEXT TEST) test strip Check sugar twice daily DX E11.69 02/24/21   Lesleigh Noe, MD    Allergies Aspirin, Atorvastatin, Lantus [insulin glargine], and Lisinopril  Family History  Problem Relation Age of  Onset   Other Mother        died from surgery   AAA (abdominal aortic aneurysm) Mother    Diabetes Father        controlled by diet   Dementia Father        brain atrophy - unknown origin   Breast cancer Cousin        maternal    Social History Social History   Tobacco Use   Smoking status: Former    Packs/day: 1.00    Years: 12.00    Pack years: 12.00    Types: Cigarettes    Quit date: 05/26/1975    Years since quitting: 45.7   Smokeless tobacco: Never  Vaping Use   Vaping Use: Never used  Substance Use Topics   Alcohol use: No    Comment: rarely   Drug use: Never    Review of Systems Constitutional: Endorses fever/chills Eyes: No visual  changes. ENT: No sore throat. Cardiovascular: Denies chest pain.  Endorses palpitations Respiratory: Endorses shortness of breath. Gastrointestinal: No abdominal pain.  No nausea, no vomiting.  No diarrhea. Genitourinary: Negative for dysuria. Musculoskeletal: Negative for acute arthralgias Skin: Negative for rash. Neurological: Negative for headaches, weakness/numbness/paresthesias in any extremity Psychiatric: Negative for suicidal ideation/homicidal ideation   ____________________________________________   PHYSICAL EXAM:  VITAL SIGNS: ED Triage Vitals [02/24/21 1427]  Enc Vitals Group     BP (!) 110/95     Pulse Rate (!) 146     Resp 16     Temp 99.1 F (37.3 C)     Temp Source Oral     SpO2 96 %     Weight      Height      Head Circumference      Peak Flow      Pain Score      Pain Loc      Pain Edu?      Excl. in [place]?    Constitutional: Alert and oriented. Well appearing and in no acute distress. Eyes: Conjunctivae are normal. PERRL. Head: Atraumatic. Nose: No congestion/rhinnorhea. Mouth/Throat: Mucous membranes are moist. Neck: No stridor Cardiovascular: Grossly normal heart sounds.  Good peripheral circulation. Respiratory: Mildly increased respiratory effort.  No retractions. Gastrointestinal: Soft  and nontender. No distention. Musculoskeletal: No obvious deformities Neurologic:  Normal speech and language. No gross focal neurologic deficits are appreciated. Skin:  Skin is warm and dry. No rash noted. Psychiatric: Mood and affect are normal. Speech and behavior are normal.  ____________________________________________   LABS (all labs ordered are listed, but only abnormal results are displayed)  Labs Reviewed  CBC WITH DIFFERENTIAL/PLATELET - Abnormal; Notable for the following components:      Result Value   Hemoglobin 11.9 (*)    HCT 34.6 (*)    All other components within normal limits  COMPREHENSIVE METABOLIC PANEL - Abnormal; Notable for the following components:   Glucose, Bld 133 (*)    Creatinine, Ser 1.62 (*)    GFR, Estimated 32 (*)    Anion gap 4 (*)    All other components within normal limits  MRSA NEXT GEN BY PCR, NASAL  GLUCOSE, CAPILLARY  BASIC METABOLIC PANEL  CBC  C-REACTIVE PROTEIN  D-DIMER, QUANTITATIVE  FERRITIN  FIBRINOGEN  LACTATE DEHYDROGENASE  C-REACTIVE PROTEIN  D-DIMER, QUANTITATIVE  FERRITIN  MAGNESIUM  PHOSPHORUS   ____________________________________________  EKG  ED ECG REPORT I, Naaman Plummer, the attending physician, personally viewed and interpreted this ECG.  Date: 02/24/2021 EKG Time: 1429 Rate: 151 Rhythm: Atrial flutter with variable AV block QRS Axis: normal Intervals: normal ST/T Wave abnormalities: normal Narrative Interpretation: Atrial flutter with variable AV block. No evidence of acute ischemia  ____________________________________________  RADIOLOGY  ED MD interpretation: 2 view chest x-ray shows no evidence of acute abnormalities including no pneumonia, pneumothorax, or widened mediastinum  Official radiology report(s): DG Chest 2 View  Result Date: 02/24/2021 CLINICAL DATA:  Shortness of breath, chills and fever beginning yesterday. EXAM: CHEST - 2 VIEW COMPARISON:  09/30/2020 FINDINGS: Heart size is  normal. Ordinary mild aortic atherosclerotic calcification is present. The lungs are clear. No infiltrate, collapse or effusion. Chronic compression fracture of a midthoracic vertebral body. No acute bone finding. IMPRESSION: No active cardiopulmonary disease. Electronically Signed   By: Nelson Chimes M.D.   On: 02/24/2021 15:07   CT Angio Chest PE W/Cm &/Or Wo Cm  Result Date: 02/24/2021 CLINICAL DATA:  Short of breath.  EXAM: CT ANGIOGRAPHY CHEST WITH CONTRAST TECHNIQUE: Multidetector CT imaging of the chest was performed using the standard protocol during bolus administration of intravenous contrast. Multiplanar CT image reconstructions and MIPs were obtained to evaluate the vascular anatomy. CONTRAST:  76mL OMNIPAQUE IOHEXOL 350 MG/ML SOLN COMPARISON:  09/04/2020. FINDINGS: Cardiovascular: Pulmonary arteries are well opacified. There is no evidence of a pulmonary embolism. Heart is normal in size. Trace pericardial effusion. Circumflex coronary artery calcifications. Great vessels normal in caliber. Mild aortic atherosclerosis. No dissection. Mediastinum/Nodes: No neck base, mediastinal or hilar masses or enlarged lymph nodes. Trachea and esophagus are unremarkable. Lungs/Pleura: Spiculated left upper lobe nodule measures 16 x 17 x 16 mm, previously 2.1 x 1.9 cm transversely. No other nodules. Several linear opacities in the lower lungs consistent with atelectasis and/or scarring, similar to the prior CT. Remainder of the lungs is clear. No pleural effusion or pneumothorax. Upper Abdomen: Previous gastric bypass procedure. No acute findings in the visualized upper abdomen. Musculoskeletal: No acute fracture. Chronic fracture of T7. Mild depression of the upper endplate of C7 that may reflect a chronic fracture or be degenerative in origin. No osteoblastic or osteolytic lesions. No chest wall masses. Review of the MIP images confirms the above findings. IMPRESSION: 1. No evidence of a pulmonary embolism. 2.  Decrease in the size of the left upper lobe spiculated nodule. No new lung nodules. 3. No acute findings in the lungs. 4. Mild aortic atherosclerosis. Aortic Atherosclerosis (ICD10-I70.0) and Emphysema (ICD10-J43.9). Electronically Signed   By: Lajean Manes M.D.   On: 02/24/2021 16:18    ____________________________________________   PROCEDURES  Procedure(s) performed (including Critical Care):  .1-3 Lead EKG Interpretation Performed by: Naaman Plummer, MD Authorized by: Naaman Plummer, MD     Interpretation: abnormal     ECG rate:  140   ECG rate assessment: tachycardic     Rhythm: atrial flutter     Ectopy: none     Conduction: normal    CRITICAL CARE Performed by: Naaman Plummer   Total critical care time: 33 minutes  Critical care time was exclusive of separately billable procedures and treating other patients.  Critical care was necessary to treat or prevent imminent or life-threatening deterioration.  Critical care was time spent personally by me on the following activities: development of treatment plan with patient and/or surrogate as well as nursing, discussions with consultants, evaluation of patient's response to treatment, examination of patient, obtaining history from patient or surrogate, ordering and performing treatments and interventions, ordering and review of laboratory studies, ordering and review of radiographic studies, pulse oximetry and re-evaluation of patient's condition.  ____________________________________________   INITIAL IMPRESSION / ASSESSMENT AND PLAN / ED COURSE  As part of my medical decision making, I reviewed the following data within the electronic medical record, if available:  Nursing notes reviewed and incorporated, Labs reviewed, EKG interpreted, Old chart reviewed, Radiograph reviewed and Notes from prior ED visits reviewed and incorporated        Presentation most consistent with Viral Syndrome.  Patient has tested positive for  COVID-19. At this time patient is requiring diltiazem drip for atrial fibrillation versus flutter.  Given History and Exam I have a lower suspicion for: Emergent CardioPulmonary causes [such as Acute Asthma or COPD Exacerbation, acute Heart Failure or exacerbation, PE, PTX, atypical ACS, PNA]. Emergent Otolaryngeal causes [such as PTA, RPA, [place], Epiglottitis, EBV].  Regarding Emergent Travel or Immunosuppressive related infectious: I have a low suspicion for acute HIV.  Given  atrial flutter/fib with rapid ventricular response, increased respiratory effort, and need for further evaluation and management, patient will require admission      ____________________________________________   FINAL CLINICAL IMPRESSION(S) / ED DIAGNOSES  Final diagnoses:  COVID-19 virus infection  SOB (shortness of breath)  Atrial flutter with rapid ventricular response [facility])     ED Discharge Orders     None        Note:  This document was prepared using Dragon voice recognition software and may include unintentional dictation errors.    Naaman Plummer, MD 02/24/21 [phone]

## 2021-02-24 NOTE — Telephone Encounter (Signed)
[facility] faxed over fax stating generic glucose RX is not valid to be filed through [employer]. Need RX with test strips names. RX re sent as requested.

## 2021-02-24 NOTE — H&P (Signed)
History and Physical    Natasha [name]  LAC:[id number]  DOB: Sep 21, 1940  DOA: 02/24/2021 PCP: Lynnda Child, MD   Patient coming from: home  Chief Complaint: shortness of breath  HPI: Natasha [name] is a 80 y.o. female with medical history of fibrillation on anticoagulation, mild aortic stenosis, diabetes mellitus type 2, chronic kidney disease stage III, lung cancer currently receiving radiation therapy. The patient states that last night she started to develop a runny nose and have chills and but this morning she noted shortness of breath and a cough.  She thinks she has been running a fever but she has not checked her temperature.  She took a COVID test and this was found to be positive.  Due to increasing shortness of breath she presented to the ED.  In the ED she is noted to have an elevated heart rate, atrial flutter but does not feel any palpitations or any chest pain.  ED Course: Heart rate in the 140s and quite regular Creatinine is 1.62 CTA chest: Left upper lobe spiculated nodule  Review of Systems:  All other systems reviewed and apart from HPI, are negative.  Past Medical History:  Diagnosis Date   A-fib (HCC)    Anemia    Aortic atherosclerosis (HCC)    Aortic stenosis, mild    Asthma    mild   Chronic cough    CKD (chronic kidney disease), stage III (HCC)    Diverticulitis    High cholesterol    History of hiatal hernia    Hypertension    Insomnia    Murmur    Restless leg    Sleep apnea    T2DM (type 2 diabetes mellitus) (HCC)     Past Surgical History:  Procedure Laterality Date   ABDOMINAL HYSTERECTOMY  1978   APPENDECTOMY  1978   BREAST BIOPSY Left ?   papilloma   BREAST CYST EXCISION Bilateral yrs ago   benign, scars not well visualized   BREAST SURGERY     CATARACT EXTRACTION W/ INTRAOCULAR LENS  IMPLANT, BILATERAL Bilateral    CHOLECYSTECTOMY     COLONOSCOPY     ELBOW SURGERY Right    [place] release   EYE SURGERY     FRACTURE SURGERY      GASTRIC BYPASS  12/22/2017   JOINT REPLACEMENT     KNEE SURGERY Left    tibial fracture with metal plate   TOTAL SHOULDER REPLACEMENT Left 2014   VIDEO BRONCHOSCOPY WITH ENDOBRONCHIAL NAVIGATION N/A 09/30/2020   Procedure: ROBOTIC ASSISTED VIDEO BRONCHOSCOPY WITH ENDOBRONCHIAL NAVIGATION;  Surgeon: Salena Saner, MD;  Location: [facility] ORS;  Service: Pulmonary;  Laterality: N/A;   WRIST SURGERY Left    fractures    Social History:   reports that she quit smoking about 45 years ago. Her smoking use included cigarettes. She has a 12.00 pack-year smoking history. She has never used smokeless tobacco. She reports that she does not drink alcohol and does not use drugs.  Allergies  Allergen Reactions   Aspirin Hives   Atorvastatin     Muscle/joint aches   Lantus [Insulin Glargine] Hives   Lisinopril Hives    Cough      Family History  Problem Relation Age of Onset   Other Mother        died from surgery   AAA (abdominal aortic aneurysm) Mother    Diabetes Father        controlled by diet   Dementia Father  brain atrophy - unknown origin   Breast cancer Cousin        maternal     Prior to Admission medications   Medication Sig Start Date End Date Taking? Authorizing Provider  apixaban (ELIQUIS) 5 MG TABS tablet Take 1 tablet (5 mg total) by mouth 2 (two) times daily. 02/17/21  Yes Lesleigh Noe, MD  Calcium Carb-Cholecalciferol (CALCIUM 600+D3 PO) Take 2 tablets by mouth in the morning.   Yes [provider]  Ferrous Gluconate (IRON 27 PO) Take 27 mg by mouth in the morning.   Yes [provider]  JANUMET 50-1000 MG tablet Take 2 tablets by mouth in the morning. 02/08/17  Yes [provider]  Multiple Vitamin (MULTIVITAMIN WITH MINERALS) TABS tablet Take 1 tablet by mouth in the morning.   Yes [provider]  pramipexole (MIRAPEX) 1 MG tablet Take 1 tablet (1 mg total) by mouth at bedtime. Appt with PCP needed for further refills.  11/27/20  Yes Lesleigh Noe, MD  pregabalin (LYRICA) 25 MG capsule Take 1 capsule by mouth in the morning and at bedtime. 02/17/21  Yes [provider]  rosuvastatin (CRESTOR) 20 MG tablet Take 1 tablet (20 mg total) by mouth at bedtime. 01/17/21  Yes Lesleigh Noe, MD  sertraline (ZOLOFT) 50 MG tablet TAKE 1 TABLET AT BEDTIME. PT NEEDS FOLLOW-UP APPT WITH PCP FOR FURTHER REFILLS. 01/13/21  Yes Lesleigh Noe, MD  TRESIBA FLEXTOUCH 100 UNIT/ML SOPN FlexTouch Pen Inject 15 Units into the skin at bedtime. 03/26/17  Yes [provider]  vitamin E 180 MG (400 UNITS) capsule Take 400 Units by mouth daily.   Yes [provider]  glucose blood (CONTOUR NEXT TEST) test strip Check sugar twice daily DX E11.69 02/24/21   Lesleigh Noe, MD    Physical Exam: Wt Readings from Last 3 Encounters:  02/17/21 70.8 kg  01/09/21 70.3 kg  12/17/20 70.5 kg   Vitals:   02/24/21 1645 02/24/21 1700 02/24/21 1715 02/24/21 1721  BP:  111/85    Pulse:  (!) 139  (!) 141  Resp:      Temp:      TempSrc:      SpO2: 92% 90% 90% 94%      Constitutional:  Calm & comfortable Eyes: PERRLA, lids and conjunctivae normal ENT:  Mucous membranes are moist.  Pharynx clear of exudate   Normal dentition.  Neck: Supple, no masses  Respiratory:  Clear to auscultation bilaterally  Normal respiratory effort.  Cardiovascular:  S1 & S2 heard, regular rate and rhythm-heart rate in the 140s and regular rate No Murmurs Abdomen:  Non distended No tenderness, No masses Bowel sounds normal Extremities:  No clubbing / cyanosis No pedal edema No joint deformity    Skin:  No rashes, lesions or ulcers Neurologic:  AAO x 3 CN 2-12 grossly intact Sensation intact Strength 5/5 in all 4 extremities Psychiatric:  Normal Mood and affect    Labs on Admission: I have personally reviewed following labs and imaging studies  CBC: Recent Labs  Lab 02/24/21 1424  WBC 7.6  NEUTROABS 5.4  HGB  11.9*  HCT 34.6*  MCV 85.9  PLT 539   Basic Metabolic Panel: Recent Labs  Lab 02/24/21 1424  NA 135  K 4.9  CL 106  CO2 25  GLUCOSE 133*  BUN 23  CREATININE 1.62*  CALCIUM 9.1   GFR: Estimated Creatinine Clearance: 26.7 mL/min (A) (by C-G formula based on SCr  of 1.62 mg/dL (H)). Liver Function Tests: Recent Labs  Lab 02/24/21 1424  AST 31  ALT 18  ALKPHOS 54  BILITOT 0.8  PROT 6.9  ALBUMIN 4.1   No results for input(s): LIPASE, AMYLASE in the last 168 hours. No results for input(s): AMMONIA in the last 168 hours. Coagulation Profile: No results for input(s): INR, PROTIME in the last 168 hours. Cardiac Enzymes: No results for input(s): CKTOTAL, CKMB, CKMBINDEX, TROPONINI in the last 168 hours. BNP (last 3 results) No results for input(s): PROBNP in the last 8760 hours. HbA1C: No results for input(s): HGBA1C in the last 72 hours. CBG: No results for input(s): GLUCAP in the last 168 hours. Lipid Profile: No results for input(s): CHOL, HDL, LDLCALC, TRIG, CHOLHDL, LDLDIRECT in the last 72 hours. Thyroid Function Tests: No results for input(s): TSH, T4TOTAL, FREET4, T3FREE, THYROIDAB in the last 72 hours. Anemia Panel: No results for input(s): VITAMINB12, FOLATE, FERRITIN, TIBC, IRON, RETICCTPCT in the last 72 hours. Urine analysis: No results found for: COLORURINE, APPEARANCEUR, LABSPEC, PHURINE, GLUCOSEU, HGBUR, BILIRUBINUR, KETONESUR, PROTEINUR, UROBILINOGEN, NITRITE, LEUKOCYTESUR Sepsis Labs: $[email or web address]'[id number]$ (procalcitonin:4,lacticidven:4) )No results found for this or any previous visit (from the past 240 hour(s)).   Radiological Exams on Admission: DG Chest 2 View  Result Date: 02/24/2021 CLINICAL DATA:  Shortness of breath, chills and fever beginning yesterday. EXAM: CHEST - 2 VIEW COMPARISON:  09/30/2020 FINDINGS: Heart size is normal. Ordinary mild aortic atherosclerotic calcification is present. The lungs are clear. No infiltrate, collapse or effusion. Chronic  compression fracture of a midthoracic vertebral body. No acute bone finding. IMPRESSION: No active cardiopulmonary disease. Electronically Signed   By: Nelson Chimes M.D.   On: 02/24/2021 15:07   CT Angio Chest PE W/Cm &/Or Wo Cm  Result Date: 02/24/2021 CLINICAL DATA:  Short of breath. EXAM: CT ANGIOGRAPHY CHEST WITH CONTRAST TECHNIQUE: Multidetector CT imaging of the chest was performed using the standard protocol during bolus administration of intravenous contrast. Multiplanar CT image reconstructions and MIPs were obtained to evaluate the vascular anatomy. CONTRAST:  26mL OMNIPAQUE IOHEXOL 350 MG/ML SOLN COMPARISON:  09/04/2020. FINDINGS: Cardiovascular: Pulmonary arteries are well opacified. There is no evidence of a pulmonary embolism. Heart is normal in size. Trace pericardial effusion. Circumflex coronary artery calcifications. Great vessels normal in caliber. Mild aortic atherosclerosis. No dissection. Mediastinum/Nodes: No neck base, mediastinal or hilar masses or enlarged lymph nodes. Trachea and esophagus are unremarkable. Lungs/Pleura: Spiculated left upper lobe nodule measures 16 x 17 x 16 mm, previously 2.1 x 1.9 cm transversely. No other nodules. Several linear opacities in the lower lungs consistent with atelectasis and/or scarring, similar to the prior CT. Remainder of the lungs is clear. No pleural effusion or pneumothorax. Upper Abdomen: Previous gastric bypass procedure. No acute findings in the visualized upper abdomen. Musculoskeletal: No acute fracture. Chronic fracture of T7. Mild depression of the upper endplate of C7 that may reflect a chronic fracture or be degenerative in origin. No osteoblastic or osteolytic lesions. No chest wall masses. Review of the MIP images confirms the above findings. IMPRESSION: 1. No evidence of a pulmonary embolism. 2. Decrease in the size of the left upper lobe spiculated nodule. No new lung nodules. 3. No acute findings in the lungs. 4. Mild aortic  atherosclerosis. Aortic Atherosclerosis (ICD10-I70.0) and Emphysema (ICD10-J43.9). Electronically Signed   By: Lajean Manes M.D.   On: 02/24/2021 16:18    EKG: Independently reviewed.  A flutter heart rate in 140s-variable block  Assessment/Plan Principal Problem:   COVID-19 virus  infection  -I have started her on remdesivir and steroids - Check COVID lab work and follow daily - Chest x-ray and CT scan does not show any infiltrates Active Problems:  A flutter with RVR - EKG reveals regular rate in the 140s -She is currently on a Cardizem infusion at 15 mg/h - I have added a dose of Lopressor 5 mg IV once - I have consulted Dr. 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, stage 3b (moderate) ([place]) -Follow creatinine    Type 2 diabetes mellitus with other specified complication ([place]) -Hold Janumet - She takes [place] 15 units at bedtime-we will change this to Lantus at 15 units - Start sliding scale insulin     OSA (obstructive sleep apnea) -We will order a CPAP for bedtime    Spinal stenosis of lumbar region without neurogenic claudication Has a follow-up with her surgeon later this week to discuss further surgery  Normocytic anemia - Follow  DVT prophylaxis: Eliquis Code Status: Full code Family Communication:   Disposition Plan:  Remains inpatient appropriate because:IV treatments appropriate due to intensity of illness or inability to take PO Consults called: Cardiology Admission status: Inpatient Level of care: ICU  Debbe Odea MD [facility] Pager: [email or web address] Password [email or web address] 7PM-7AM, please contact night-coverage   02/24/2021, 5:52 PM

## 2021-02-24 NOTE — Telephone Encounter (Signed)
Please call pt back and clarify any symptoms she is having and when they started.   Her husband has covid which is the exposure from last week  Anticipate molnupiravir due to renal function

## 2021-02-24 NOTE — Telephone Encounter (Signed)
Pt had an appt on 9/26, she said that she was told that if she tested positive for Covid to call back. She tested positive today

## 2021-02-24 NOTE — Telephone Encounter (Signed)
Noted, will defer treatment to ER team

## 2021-02-25 ENCOUNTER — Inpatient Hospital Stay
Admit: 2021-02-25 | Discharge: 2021-02-25 | Disposition: A | Discharge status: Home / Self Care | Payer: Medicare Other | Attending: Cardiovascular Disease | Admitting: Cardiovascular Disease

## 2021-02-25 DIAGNOSIS — U071 COVID-19: Secondary | ICD-10-CM | POA: Diagnosis not present

## 2021-02-25 LAB — CBC
HCT: 29.4 % — ABNORMAL LOW (ref 36.0–46.0)
Hemoglobin: 9.8 g/dL — ABNORMAL LOW (ref 12.0–15.0)
MCH: 29.2 pg (ref 26.0–34.0)
MCHC: 33.3 g/dL (ref 30.0–36.0)
MCV: 87.5 fL (ref 80.0–100.0)
Platelets: 195 10*3/uL (ref 150–400)
RBC: 3.36 MIL/uL — ABNORMAL LOW (ref 3.87–5.11)
RDW: 13.9 % (ref 11.5–15.5)
WBC: 4.3 10*3/uL (ref 4.0–10.5)
nRBC: 0 % (ref 0.0–0.2)

## 2021-02-25 LAB — ECHOCARDIOGRAM COMPLETE
AR max vel: 1.98 cm2
AV Area VTI: 2.11 cm2
AV Area mean vel: 1.99 cm2
AV Mean grad: 5 mmHg
AV Peak grad: 8.4 mmHg
Ao pk vel: 1.45 m/s
Area-P 1/2: 2.91 cm2
Height: 64 in
MV VTI: 1.93 cm2
Single Plane A4C EF: 67.3 %
Weight: 2680.79 oz

## 2021-02-25 LAB — BASIC METABOLIC PANEL
Anion gap: 8 (ref 5–15)
BUN: 28 mg/dL — ABNORMAL HIGH (ref 8–23)
CO2: 21 mmol/L — ABNORMAL LOW (ref 22–32)
Calcium: 8.3 mg/dL — ABNORMAL LOW (ref 8.9–10.3)
Chloride: 104 mmol/L (ref 98–111)
Creatinine, Ser: 1.56 mg/dL — ABNORMAL HIGH (ref 0.44–1.00)
GFR, Estimated: 33 mL/min — ABNORMAL LOW (ref >60)
Glucose, Bld: 422 mg/dL — ABNORMAL HIGH (ref 70–99)
Potassium: 4.7 mmol/L (ref 3.5–5.1)
Sodium: 133 mmol/L — ABNORMAL LOW (ref 135–145)

## 2021-02-25 LAB — HEMOGLOBIN A1C
Hgb A1c MFr Bld: 9.4 % — ABNORMAL HIGH (ref 4.8–5.6)
Mean Plasma Glucose: 223 mg/dL

## 2021-02-25 LAB — GLUCOSE, CAPILLARY
Glucose-Capillary: 419 mg/dL — ABNORMAL HIGH (ref 70–99)
Glucose-Capillary: 332 mg/dL — ABNORMAL HIGH (ref 70–99)
Glucose-Capillary: 331 mg/dL — ABNORMAL HIGH (ref 70–99)
Glucose-Capillary: 226 mg/dL — ABNORMAL HIGH (ref 70–99)
Glucose-Capillary: 111 mg/dL — ABNORMAL HIGH (ref 70–99)

## 2021-02-25 LAB — PHOSPHORUS: Phosphorus: 4.6 mg/dL (ref 2.5–4.6)

## 2021-02-25 LAB — MAGNESIUM: Magnesium: 1.7 mg/dL (ref 1.7–2.4)

## 2021-02-25 LAB — D-DIMER, QUANTITATIVE: D-Dimer, Quant: 0.72 ug/mL-FEU — ABNORMAL HIGH (ref 0.00–0.50)

## 2021-02-25 LAB — FERRITIN: Ferritin: 27 ng/mL (ref 11–307)

## 2021-02-25 LAB — C-REACTIVE PROTEIN: CRP: 1.1 mg/dL — ABNORMAL HIGH (ref <1.0)

## 2021-02-25 MED ORDER — PERFLUTREN LIPID MICROSPHERE
1.0000 mL | INTRAVENOUS | Status: AC | PRN
  Administered 2021-02-25: 3 mL via INTRAVENOUS
  Filled 2021-02-25: qty 10

## 2021-02-25 MED ORDER — INSULIN GLARGINE-YFGN 100 UNIT/ML ~~LOC~~ SOLN
15.0000 [IU] | Freq: Every day | SUBCUTANEOUS | Status: DC
  Filled 2021-02-25: qty 0.15

## 2021-02-25 MED ORDER — INSULIN DETEMIR 100 UNIT/ML ~~LOC~~ SOLN
20.0000 [IU] | Freq: Once | SUBCUTANEOUS | Status: AC
  Administered 2021-02-25: 20 [IU] via SUBCUTANEOUS
  Filled 2021-02-25: qty 0.2

## 2021-02-25 MED ORDER — NEPRO/CARBSTEADY PO LIQD
237.0000 mL | Freq: Two times a day (BID) | ORAL | Status: DC
  Administered 2021-02-25 – 2021-02-26 (×2): 237 mL via ORAL

## 2021-02-25 MED ORDER — INSULIN GLARGINE-YFGN 100 UNIT/ML ~~LOC~~ SOLN: 20.0000 [IU] | Freq: Every day | SUBCUTANEOUS | Status: DC

## 2021-02-25 MED ORDER — AMIODARONE HCL 200 MG PO TABS
400.0000 mg | ORAL_TABLET | Freq: Every day | ORAL | Status: DC
  Administered 2021-02-25 – 2021-02-26 (×2): 400 mg via ORAL
  Filled 2021-02-25 (×2): qty 2

## 2021-02-25 NOTE — Consult Note (Signed)
Natasha [name] is a 80 y.o. female  [id number]  Primary Cardiologist: Adrian Blackwater Reason for Consultation: Atrial fibrillation  HPI: This is a 80 year old white female with a history of atrial fibrillation and anemia aortic stenosis who presented to the hospital with COVID infection and was found to be in atrial fibrillation with rapid ventricular response rate about 140/min.   Review of Systems: Shortness of breath   Past Medical History:  Diagnosis Date   A-fib (HCC)    Anemia    Aortic atherosclerosis (HCC)    Aortic stenosis, mild    Asthma    mild   Chronic cough    CKD (chronic kidney disease), stage III (HCC)    Diverticulitis    High cholesterol    History of hiatal hernia    Hypertension    Insomnia    Murmur    Restless leg    Sleep apnea    T2DM (type 2 diabetes mellitus) (HCC)     Medications Prior to Admission  Medication Sig Dispense Refill   apixaban (ELIQUIS) 5 MG TABS tablet Take 1 tablet (5 mg total) by mouth 2 (two) times daily. 180 tablet 1   Calcium Carb-Cholecalciferol (CALCIUM 600+D3 PO) Take 2 tablets by mouth in the morning.     Ferrous Gluconate (IRON 27 PO) Take 27 mg by mouth in the morning.     JANUMET 50-1000 MG tablet Take 2 tablets by mouth in the morning.     Multiple Vitamin (MULTIVITAMIN WITH MINERALS) TABS tablet Take 1 tablet by mouth in the morning.     pramipexole (MIRAPEX) 1 MG tablet Take 1 tablet (1 mg total) by mouth at bedtime. Appt with PCP needed for further refills. 90 tablet 3   pregabalin (LYRICA) 25 MG capsule Take 1 capsule by mouth in the morning and at bedtime.     rosuvastatin (CRESTOR) 20 MG tablet Take 1 tablet (20 mg total) by mouth at bedtime. 90 tablet 3   sertraline (ZOLOFT) 50 MG tablet TAKE 1 TABLET AT BEDTIME. PT NEEDS FOLLOW-UP APPT WITH PCP FOR FURTHER REFILLS. 90 tablet 0   TRESIBA FLEXTOUCH 100 UNIT/ML SOPN FlexTouch Pen Inject 15 Units into the skin at bedtime.     vitamin E 180 MG (400 UNITS) capsule  Take 400 Units by mouth daily.     glucose blood (CONTOUR NEXT TEST) test strip Check sugar twice daily DX E11.69 100 each 12      apixaban  5 mg Oral BID   calcium-vitamin D  1 tablet Oral q AM   Chlorhexidine Gluconate Cloth  6 each Topical Q0600   dexamethasone (DECADRON) injection  6 mg Intravenous Q24H   influenza vaccine adjuvanted  0.5 mL Intramuscular Tomorrow-1000   insulin aspart  0-15 Units Subcutaneous TID WC   insulin aspart  0-5 Units Subcutaneous QHS   insulin detemir  20 Units Subcutaneous Once   multivitamin with minerals  1 tablet Oral Daily   pramipexole  1 mg Oral QHS   pregabalin  25 mg Oral BID   rosuvastatin  20 mg Oral QHS   sertraline  50 mg Oral QHS    Infusions:  remdesivir 100 mg in NS 100 mL      Allergies  Allergen Reactions   Aspirin Hives   Atorvastatin     Muscle/joint aches   Lantus [Insulin Glargine] Hives   Lisinopril Hives    Cough      Social History   Socioeconomic History  Marital status: Married    Spouse name: Scientist, physiological   Number of children: 1   Years of education: some college   Highest education level: Not on file  Occupational History   Not on file  Tobacco Use   Smoking status: Former    Packs/day: 1.00    Years: 12.00    Pack years: 12.00    Types: Cigarettes    Quit date: 05/26/1975    Years since quitting: 45.7   Smokeless tobacco: Never  Vaping Use   Vaping Use: Never used  Substance and Sexual Activity   Alcohol use: No    Comment: rarely   Drug use: Never   Sexual activity: Not Currently  Other Topics Concern   Not on file  Social History Narrative   07/22/20   [place] near [place]   Living: with husband, Scientist, physiological [phone])   Work: retired - high end Journalist, newspaper      Family: grandson - Ovid Curd 1998-08-12) (son is deceased) - and living with them      Enjoys: Enjoys Social worker, going to art shows, gardening and painting      Exercise: not currently   Diet: does not follow  diabetic diet      Safety   Seat belts: Yes    Guns: Yes  and secure   Safe in relationships: Yes    Social Determinants of Health   Financial Resource Strain: Not on file  Food Insecurity: Not on file  Transportation Needs: Not on file  Physical Activity: Not on file  Stress: Not on file  Social Connections: Not on file  Intimate Partner Violence: Not on file    Family History  Problem Relation Age of Onset   Other Mother        died from surgery   AAA (abdominal aortic aneurysm) Mother    Diabetes Father        controlled by diet   Dementia Father        brain atrophy - unknown origin   Breast cancer Cousin        maternal    PHYSICAL EXAM: Vitals:   02/25/21 0700 02/25/21 0800  BP: 101/62 102/66  Pulse: 65 68  Resp: 19 18  Temp: 97.9 F (36.6 C)   SpO2: 95% 99%     Intake/Output Summary (Last 24 hours) at 02/25/2021 0838 Last data filed at 02/25/2021 0800 Gross per 24 hour  Intake 280.45 ml  Output 500 ml  Net -219.55 ml    General:  Well appearing. No respiratory difficulty HEENT: normal Neck: supple. no JVD. Carotids 2+ bilat; no bruits. No lymphadenopathy or thryomegaly appreciated. Cor: PMI nondisplaced. Regular rate & rhythm. No rubs, gallops or murmurs. Lungs: clear Abdomen: soft, nontender, nondistended. No hepatosplenomegaly. No bruits or masses. Good bowel sounds. Extremities: no cyanosis, clubbing, rash, edema Neuro: alert & oriented x 3, cranial nerves grossly intact. moves all 4 extremities w/o difficulty. Affect pleasant.  ECG: Atrial flutter with ventricular rate about 1 40-1 50   Results for orders placed or performed during the hospital encounter of 02/24/21 (from the past 24 hour(s))  CBC with Differential     Status: Abnormal   Collection Time: 02/24/21  2:24 PM  Result Value Ref Range   WBC 7.6 4.0 - 10.5 K/uL   RBC 4.03 3.87 - 5.11 MIL/uL   Hemoglobin 11.9 (L) 12.0 - 15.0 g/dL   HCT 34.6 (L) 36.0 - 46.0 %  MCV 85.9 80.0 -  100.0 fL   MCH 29.5 26.0 - 34.0 pg   MCHC 34.4 30.0 - 36.0 g/dL   RDW 13.8 11.5 - 15.5 %   Platelets 245 150 - 400 K/uL   nRBC 0.0 0.0 - 0.2 %   Neutrophils Relative % 72 %   Neutro Abs 5.4 1.7 - 7.7 K/uL   Lymphocytes Relative 15 %   Lymphs Abs 1.2 0.7 - 4.0 K/uL   Monocytes Relative 12 %   Monocytes Absolute 0.9 0.1 - 1.0 K/uL   Eosinophils Relative 0 %   Eosinophils Absolute 0.0 0.0 - 0.5 K/uL   Basophils Relative 0 %   Basophils Absolute 0.0 0.0 - 0.1 K/uL   Immature Granulocytes 1 %   Abs Immature Granulocytes 0.04 0.00 - 0.07 K/uL  Comprehensive metabolic panel     Status: Abnormal   Collection Time: 02/24/21  2:24 PM  Result Value Ref Range   Sodium 135 135 - 145 mmol/L   Potassium 4.9 3.5 - 5.1 mmol/L   Chloride 106 98 - 111 mmol/L   CO2 25 22 - 32 mmol/L   Glucose, Bld 133 (H) 70 - 99 mg/dL   BUN 23 8 - 23 mg/dL   Creatinine, Ser 1.62 (H) 0.44 - 1.00 mg/dL   Calcium 9.1 8.9 - 10.3 mg/dL   Total Protein 6.9 6.5 - 8.1 g/dL   Albumin 4.1 3.5 - 5.0 g/dL   AST 31 15 - 41 U/L   ALT 18 0 - 44 U/L   Alkaline Phosphatase 54 38 - 126 U/L   Total Bilirubin 0.8 0.3 - 1.2 mg/dL   GFR, Estimated 32 (L) >60 mL/min   Anion gap 4 (L) 5 - 15  MRSA Next Gen by PCR, Nasal     Status: None   Collection Time: 02/24/21  6:04 PM   Specimen: Nasal Mucosa; Nasal Swab  Result Value Ref Range   MRSA by PCR Next Gen NOT DETECTED NOT DETECTED  Glucose, capillary     Status: None   Collection Time: 02/24/21  6:04 PM  Result Value Ref Range   Glucose-Capillary 82 70 - 99 mg/dL  C-reactive protein     Status: Abnormal   Collection Time: 02/24/21  6:40 PM  Result Value Ref Range   CRP 1.0 (H) <1.0 mg/dL  D-dimer, quantitative     Status: Abnormal   Collection Time: 02/24/21  6:40 PM  Result Value Ref Range   D-Dimer, Quant 0.82 (H) 0.00 - 0.50 ug/mL-FEU  Ferritin     Status: None   Collection Time: 02/24/21  6:40 PM  Result Value Ref Range   Ferritin 24 11 - 307 ng/mL  Fibrinogen      Status: Abnormal   Collection Time: 02/24/21  6:40 PM  Result Value Ref Range   Fibrinogen 482 (H) 210 - 475 mg/dL  Lactate dehydrogenase     Status: None   Collection Time: 02/24/21  6:40 PM  Result Value Ref Range   LDH 140 98 - 192 U/L  Glucose, capillary     Status: Abnormal   Collection Time: 02/24/21  9:30 PM  Result Value Ref Range   Glucose-Capillary 181 (H) 70 - 99 mg/dL  Basic metabolic panel     Status: Abnormal   Collection Time: 02/25/21  4:16 AM  Result Value Ref Range   Sodium 133 (L) 135 - 145 mmol/L   Potassium 4.7 3.5 - 5.1 mmol/L   Chloride 104 98 -  111 mmol/L   CO2 21 (L) 22 - 32 mmol/L   Glucose, Bld 422 (H) 70 - 99 mg/dL   BUN 28 (H) 8 - 23 mg/dL   Creatinine, Ser 1.56 (H) 0.44 - 1.00 mg/dL   Calcium 8.3 (L) 8.9 - 10.3 mg/dL   GFR, Estimated 33 (L) >60 mL/min   Anion gap 8 5 - 15  CBC     Status: Abnormal   Collection Time: 02/25/21  4:16 AM  Result Value Ref Range   WBC 4.3 4.0 - 10.5 K/uL   RBC 3.36 (L) 3.87 - 5.11 MIL/uL   Hemoglobin 9.8 (L) 12.0 - 15.0 g/dL   HCT 29.4 (L) 36.0 - 46.0 %   MCV 87.5 80.0 - 100.0 fL   MCH 29.2 26.0 - 34.0 pg   MCHC 33.3 30.0 - 36.0 g/dL   RDW 13.9 11.5 - 15.5 %   Platelets 195 150 - 400 K/uL   nRBC 0.0 0.0 - 0.2 %  D-dimer, quantitative     Status: Abnormal   Collection Time: 02/25/21  4:16 AM  Result Value Ref Range   D-Dimer, Quant 0.72 (H) 0.00 - 0.50 ug/mL-FEU  Ferritin     Status: None   Collection Time: 02/25/21  4:16 AM  Result Value Ref Range   Ferritin 27 11 - 307 ng/mL  Magnesium     Status: None   Collection Time: 02/25/21  4:16 AM  Result Value Ref Range   Magnesium 1.7 1.7 - 2.4 mg/dL  Phosphorus     Status: None   Collection Time: 02/25/21  4:16 AM  Result Value Ref Range   Phosphorus 4.6 2.5 - 4.6 mg/dL  Glucose, capillary     Status: Abnormal   Collection Time: 02/25/21  7:28 AM  Result Value Ref Range   Glucose-Capillary 419 (H) 70 - 99 mg/dL   DG Chest 2 View  Result Date:  02/24/2021 CLINICAL DATA:  Shortness of breath, chills and fever beginning yesterday. EXAM: CHEST - 2 VIEW COMPARISON:  09/30/2020 FINDINGS: Heart size is normal. Ordinary mild aortic atherosclerotic calcification is present. The lungs are clear. No infiltrate, collapse or effusion. Chronic compression fracture of a midthoracic vertebral body. No acute bone finding. IMPRESSION: No active cardiopulmonary disease. Electronically Signed   By: Nelson Chimes M.D.   On: 02/24/2021 15:07   CT Angio Chest PE W/Cm &/Or Wo Cm  Result Date: 02/24/2021 CLINICAL DATA:  Short of breath. EXAM: CT ANGIOGRAPHY CHEST WITH CONTRAST TECHNIQUE: Multidetector CT imaging of the chest was performed using the standard protocol during bolus administration of intravenous contrast. Multiplanar CT image reconstructions and MIPs were obtained to evaluate the vascular anatomy. CONTRAST:  48mL OMNIPAQUE IOHEXOL 350 MG/ML SOLN COMPARISON:  09/04/2020. FINDINGS: Cardiovascular: Pulmonary arteries are well opacified. There is no evidence of a pulmonary embolism. Heart is normal in size. Trace pericardial effusion. Circumflex coronary artery calcifications. Great vessels normal in caliber. Mild aortic atherosclerosis. No dissection. Mediastinum/Nodes: No neck base, mediastinal or hilar masses or enlarged lymph nodes. Trachea and esophagus are unremarkable. Lungs/Pleura: Spiculated left upper lobe nodule measures 16 x 17 x 16 mm, previously 2.1 x 1.9 cm transversely. No other nodules. Several linear opacities in the lower lungs consistent with atelectasis and/or scarring, similar to the prior CT. Remainder of the lungs is clear. No pleural effusion or pneumothorax. Upper Abdomen: Previous gastric bypass procedure. No acute findings in the visualized upper abdomen. Musculoskeletal: No acute fracture. Chronic fracture of T7. Mild depression of the  upper endplate of C7 that may reflect a chronic fracture or be degenerative in origin. No osteoblastic or  osteolytic lesions. No chest wall masses. Review of the MIP images confirms the above findings. IMPRESSION: 1. No evidence of a pulmonary embolism. 2. Decrease in the size of the left upper lobe spiculated nodule. No new lung nodules. 3. No acute findings in the lungs. 4. Mild aortic atherosclerosis. Aortic Atherosclerosis (ICD10-I70.0) and Emphysema (ICD10-J43.9). Electronically Signed   By: Lajean Manes M.D.   On: 02/24/2021 16:18     ASSESSMENT AND PLAN: Atrial flutter with a rate of 150 last night but converted to sinus rhythm.  Advise giving amiodarone 400 mg p.o. once a day.  Hopefully this will keep the patient in sinus rhythm and patient is already on Eliquis.  Advise getting echocardiogram.  Natasha [name] A

## 2021-02-25 NOTE — Progress Notes (Signed)
*  PRELIMINARY RESULTS* Echocardiogram 2D Echocardiogram has been performed.  Natasha [name] 02/25/2021, 12:50 PM

## 2021-02-25 NOTE — Progress Notes (Signed)
PROGRESS NOTE    Natasha [name]   OVF:[id number]  DOB: Jun 28, 1940  DOA: 02/24/2021 PCP: Lesleigh Noe, MD   Brief Narrative:  Natasha [name] is a 80 y.o. female with medical history of fibrillation on anticoagulation, mild aortic stenosis, diabetes mellitus type 2, chronic kidney disease stage III, lung cancer currently receiving radiation therapy. She presented to the ED for dyspnea and is found to have COVID 19 and A-flutter with RVR. Started on a Cardizem infusion and also treated with Metoprolol. She converted to NSR.    Subjective: Has some shortness of breath but not as severe as yesterday. Mild cough.     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, stage 3b (moderate) ([place]) -Follow creatinine PRN     Type 2 diabetes mellitus with other specified complication ([place]) -Hold Janumet - She takes [place] 15 units at bedtime-  changed this to Lantus at 15 units - Started sliding scale insulin      OSA (obstructive sleep apnea) -We will order a CPAP for bedtime     Spinal stenosis of lumbar region without neurogenic claudication Has a follow-up with her surgeon later this week to discuss further surgery   Normocytic anemia - Follow   Time spent in minutes: 35 DVT prophylaxis: SCDs Start: 02/24/21 1712 apixaban (ELIQUIS) tablet 5 mg   Code Status: full code Family Communication:   Level of Care: Level of care: Med-Surg Disposition Plan:  Status is:  Inpatient  Remains inpatient appropriate because:Inpatient level of care appropriate due to severity of illness  Dispo: The patient is from: Home              Anticipated d/c is to: Home              Patient currently is not medically stable to d/c.   Difficult to place patient No      Consultants:  none Procedures:  none Antimicrobials:  Anti-infectives (From admission, onward)    Start     Dose/Rate Route Frequency Ordered Stop   02/25/21 1000  remdesivir 100 mg in sodium chloride 0.9 % 100 mL IVPB       See Hyperspace for full Linked Orders Report.   100 mg 200 mL/hr over 30 Minutes Intravenous Daily 02/24/21 1738 03/01/21 0959   02/24/21 1830  remdesivir 200 mg in sodium chloride 0.9% 250 mL IVPB       See Hyperspace for full Linked Orders Report.   200 mg 580 mL/hr over 30 Minutes Intravenous Once 02/24/21 1738 02/24/21 2155        Objective: Vitals:   02/25/21 1400 02/25/21 1500 02/25/21 1600 02/25/21 1700  BP: 114/66 109/62 111/66 116/65  Pulse: 73 69 75 74  Resp: (!) 27 (!) 24 12 (!) 21  Temp:      TempSrc:      SpO2: 98% 94% 99% 95%  Weight:      Height:  Intake/Output Summary (Last 24 hours) at 02/25/2021 1740 Last data filed at 02/25/2021 1300 Gross per 24 hour  Intake 820.45 ml  Output 500 ml  Net 320.45 ml   Filed Weights   02/24/21 1800  Weight: 76 kg    Examination: General exam: Appears comfortable  HEENT: PERRLA, oral mucosa moist, no sclera icterus or thrush Respiratory system: Clear to auscultation. Respiratory effort normal. Cardiovascular system: S1 & S2 heard, RRR.   Gastrointestinal system: Abdomen soft, non-tender, nondistended. Normal bowel sounds. Central nervous system: Alert and oriented. No focal neurological deficits. Extremities: No cyanosis, clubbing or edema Skin: No rashes or ulcers Psychiatry:  Mood & affect appropriate.     Data Reviewed: I have personally reviewed following labs and imaging  studies  CBC: Recent Labs  Lab 02/24/21 1424 02/25/21 0416  WBC 7.6 4.3  NEUTROABS 5.4  --   HGB 11.9* 9.8*  HCT 34.6* 29.4*  MCV 85.9 87.5  PLT 245 711   Basic Metabolic Panel: Recent Labs  Lab 02/24/21 1424 02/25/21 0416  NA 135 133*  K 4.9 4.7  CL 106 104  CO2 25 21*  GLUCOSE 133* 422*  BUN 23 28*  CREATININE 1.62* 1.56*  CALCIUM 9.1 8.3*  MG  --  1.7  PHOS  --  4.6   GFR: Estimated Creatinine Clearance: 28.7 mL/min (A) (by C-G formula based on SCr of 1.56 mg/dL (H)). Liver Function Tests: Recent Labs  Lab 02/24/21 1424  AST 31  ALT 18  ALKPHOS 54  BILITOT 0.8  PROT 6.9  ALBUMIN 4.1   No results for input(s): LIPASE, AMYLASE in the last 168 hours. No results for input(s): AMMONIA in the last 168 hours. Coagulation Profile: No results for input(s): INR, PROTIME in the last 168 hours. Cardiac Enzymes: No results for input(s): CKTOTAL, CKMB, CKMBINDEX, TROPONINI in the last 168 hours. BNP (last 3 results) No results for input(s): PROBNP in the last 8760 hours. HbA1C: No results for input(s): HGBA1C in the last 72 hours. CBG: Recent Labs  Lab 02/24/21 2130 02/25/21 0728 02/25/21 0941 02/25/21 1239 02/25/21 1701  GLUCAP 181* 419* 332* 331* 226*   Lipid Profile: No results for input(s): CHOL, HDL, LDLCALC, TRIG, CHOLHDL, LDLDIRECT in the last 72 hours. Thyroid Function Tests: No results for input(s): TSH, T4TOTAL, FREET4, T3FREE, THYROIDAB in the last 72 hours. Anemia Panel: Recent Labs    02/24/21 1840 02/25/21 0416  FERRITIN 24 27   Urine analysis: No results found for: COLORURINE, APPEARANCEUR, LABSPEC, PHURINE, GLUCOSEU, HGBUR, BILIRUBINUR, KETONESUR, PROTEINUR, UROBILINOGEN, NITRITE, LEUKOCYTESUR Sepsis Labs: $RemoveBefo'@[email or web address]'[id number]$ (procalcitonin:4,lacticidven:4) ) Recent Results (from the past 240 hour(s))  MRSA Next Gen by PCR, Nasal     Status: None   Collection Time: 02/24/21  6:04 PM   Specimen: Nasal Mucosa; Nasal Swab  Result Value Ref  Range Status   MRSA by PCR Next Gen NOT DETECTED NOT DETECTED Final    Comment: (NOTE) The GeneXpert MRSA Assay (FDA approved for NASAL specimens only), is one component of a comprehensive MRSA colonization surveillance program. It is not intended to diagnose MRSA infection nor to guide or monitor treatment for MRSA infections. Test performance is not FDA approved in patients less than 61 years old. Performed at [facility], [address] [place], [place] [address]          Radiology Studies: DG Chest 2 View  Result Date: 02/24/2021 CLINICAL DATA:  Shortness of breath, chills and fever beginning yesterday. EXAM: CHEST - 2 VIEW COMPARISON:  09/30/2020 FINDINGS: Heart size is normal. Ordinary mild aortic atherosclerotic calcification is present. The lungs are clear. No infiltrate, collapse or effusion. Chronic compression fracture of a midthoracic vertebral body. No acute bone finding. IMPRESSION: No active cardiopulmonary disease. Electronically Signed   By: Nelson Chimes M.D.   On: 02/24/2021 15:07   CT Angio Chest PE W/Cm &/Or Wo Cm  Result Date: 02/24/2021 CLINICAL DATA:  Short of breath. EXAM: CT ANGIOGRAPHY CHEST WITH CONTRAST TECHNIQUE: Multidetector CT imaging of the chest was performed using the standard protocol during bolus administration of intravenous contrast. Multiplanar CT image reconstructions and MIPs were obtained to evaluate the vascular anatomy. CONTRAST:  56mL OMNIPAQUE IOHEXOL 350 MG/ML SOLN COMPARISON:  09/04/2020. FINDINGS: Cardiovascular: Pulmonary arteries are well opacified. There is no evidence of a pulmonary embolism. Heart is normal in size. Trace pericardial effusion. Circumflex coronary artery calcifications. Great vessels normal in caliber. Mild aortic atherosclerosis. No dissection. Mediastinum/Nodes: No neck base, mediastinal or hilar masses or enlarged lymph nodes. Trachea and esophagus are unremarkable. Lungs/Pleura: Spiculated left upper lobe  nodule measures 16 x 17 x 16 mm, previously 2.1 x 1.9 cm transversely. No other nodules. Several linear opacities in the lower lungs consistent with atelectasis and/or scarring, similar to the prior CT. Remainder of the lungs is clear. No pleural effusion or pneumothorax. Upper Abdomen: Previous gastric bypass procedure. No acute findings in the visualized upper abdomen. Musculoskeletal: No acute fracture. Chronic fracture of T7. Mild depression of the upper endplate of C7 that may reflect a chronic fracture or be degenerative in origin. No osteoblastic or osteolytic lesions. No chest wall masses. Review of the MIP images confirms the above findings. IMPRESSION: 1. No evidence of a pulmonary embolism. 2. Decrease in the size of the left upper lobe spiculated nodule. No new lung nodules. 3. No acute findings in the lungs. 4. Mild aortic atherosclerosis. Aortic Atherosclerosis (ICD10-I70.0) and Emphysema (ICD10-J43.9). Electronically Signed   By: Lajean Manes M.D.   On: 02/24/2021 16:18   ECHOCARDIOGRAM COMPLETE  Result Date: 02/25/2021    ECHOCARDIOGRAM REPORT   Patient Name:   [facility] Date of Exam: 02/25/2021 Medical Rec #:  [id number]     Height:       64.0 in Accession #:    [id number]    Weight:       167.5 lb Date of Birth:  [birth year]     BSA:          1.815 m Patient Age:    41 years      BP:           104/65 mmHg Patient Gender: F             HR:           89 bpm. Exam Location:  [facility] Procedure: 2D Echo, Color Doppler, Cardiac Doppler and Intracardiac            Opacification Agent Indications:     I48.92 Atrial flutter  History:         Patient has prior history of Echocardiogram examinations. CKD;                  Risk Factors:Hypertension, HCL, Diabetes and Sleep Apnea.  Sonographer:     Charmayne Sheer Referring Phys:  [place] Diagnosing Phys: Neoma Laming  Sonographer Comments: Technically challenging study due to limited acoustic windows and Technically difficult study due to poor echo  windows. Image acquisition challenging due to respiratory motion. IMPRESSIONS  1. Left ventricular ejection fraction, by estimation, is 60 to 65%. The left ventricle has normal function. The left ventricle has no regional wall motion abnormalities. There is mild concentric left ventricular hypertrophy. Left ventricular diastolic parameters are consistent with Grade I diastolic dysfunction (impaired relaxation).  2. Right ventricular systolic function is normal. The right ventricular size is normal.  3. Left atrial size was mildly dilated.  4. The mitral valve is normal in structure. No evidence of mitral valve regurgitation. No evidence of mitral stenosis.  5. The aortic valve is normal in structure. Aortic valve regurgitation is not visualized. No aortic stenosis is present.  6. The inferior vena cava is normal in size with greater than 50% respiratory variability, suggesting right atrial pressure of 3 mmHg. FINDINGS  Left Ventricle: Left ventricular ejection fraction, by estimation, is 60 to 65%. The left ventricle has normal function. The left ventricle has no regional wall motion abnormalities. Definity contrast agent was given IV to delineate the left ventricular  endocardial borders. The left ventricular internal cavity size was normal in size. There is mild concentric left ventricular hypertrophy. Left ventricular diastolic parameters are consistent with Grade I diastolic dysfunction (impaired relaxation). Right Ventricle: The right ventricular size is normal. No increase in right ventricular wall thickness. Right ventricular systolic function is normal. Left Atrium: Left atrial size was mildly dilated. Right Atrium: Right atrial size was normal in size. Pericardium: There is no evidence of pericardial effusion. Mitral Valve: The mitral valve is normal in structure. No evidence of mitral valve regurgitation. No evidence of mitral valve stenosis. MV peak gradient, 3.6 mmHg. The mean mitral valve gradient is  2.0 mmHg. Tricuspid Valve: The tricuspid valve is normal in structure. Tricuspid valve regurgitation is not demonstrated. No evidence of tricuspid stenosis. Aortic Valve: The aortic valve is normal in structure. Aortic valve regurgitation is not visualized. No aortic stenosis is present. Aortic valve mean gradient measures 5.0 mmHg. Aortic valve peak gradient measures 8.4 mmHg. Aortic valve area, by VTI measures 2.11 cm. Pulmonic Valve: The pulmonic valve was normal in structure. Pulmonic valve regurgitation is not visualized. No evidence of pulmonic stenosis. Aorta: The aortic root is normal in size and structure. Venous: The inferior vena cava is normal in size with greater than 50% respiratory variability, suggesting right atrial pressure of 3 mmHg. IAS/Shunts: No atrial level shunt detected by color flow Doppler.  LEFT VENTRICLE PLAX 2D LVOT diam:     2.20 cm     Diastology LV SV:         57          LV e' medial:    8.49 cm/s LV SV Index:   31          LV E/e' medial:  10.4 LVOT Area:     3.80 cm    LV e' lateral:   7.72 cm/s                            LV E/e' lateral: 11.4  LV Volumes (MOD) LV vol d, MOD A4C: 51.1 ml LV vol s, MOD A4C: 16.7 ml LV SV MOD A4C:     51.1 ml AORTIC VALVE AV Area (Vmax):    1.98 cm AV Area (Vmean):   1.99 cm AV Area (VTI):     2.11 cm AV Vmax:           145.00 cm/s AV Vmean:  102.000 cm/s AV VTI:            0.269 m AV Peak Grad:      8.4 mmHg AV Mean Grad:      5.0 mmHg LVOT Vmax:         75.70 cm/s LVOT Vmean:        53.400 cm/s LVOT VTI:          0.149 m LVOT/AV VTI ratio: 0.55  AORTA Ao Root diam: 3.40 cm MITRAL VALVE MV Area (PHT): 2.91 cm    SHUNTS MV Area VTI:   1.93 cm    Systemic VTI:  0.15 m MV Peak grad:  3.6 mmHg    Systemic Diam: 2.20 cm MV Mean grad:  2.0 mmHg MV Vmax:       0.95 m/s MV Vmean:      63.1 cm/s MV Decel Time: 261 msec MV E velocity: 88.30 cm/s MV A velocity: 87.80 cm/s MV E/A ratio:  1.01 Shaukat Khan Electronically signed by Neoma Laming  Signature Date/Time: 02/25/2021/1:33:58 PM    Final       Scheduled Meds:  amiodarone  400 mg Oral Daily   apixaban  5 mg Oral BID   calcium-vitamin D  1 tablet Oral q AM   Chlorhexidine Gluconate Cloth  6 each Topical Q0600   dexamethasone (DECADRON) injection  6 mg Intravenous Q24H   feeding supplement (NEPRO CARB STEADY)  237 mL Oral BID BM   influenza vaccine adjuvanted  0.5 mL Intramuscular Tomorrow-1000   insulin aspart  0-15 Units Subcutaneous TID WC   insulin aspart  0-5 Units Subcutaneous QHS   multivitamin with minerals  1 tablet Oral Daily   pramipexole  1 mg Oral QHS   pregabalin  25 mg Oral BID   rosuvastatin  20 mg Oral QHS   sertraline  50 mg Oral QHS   Continuous Infusions:  remdesivir 100 mg in NS 100 mL Stopped (02/25/21 0939)     LOS: 1 day      Debbe Odea, MD [facility] Pager: [email or web address] 02/25/2021, 5:40 PM

## 2021-02-25 NOTE — Progress Notes (Signed)
Inpatient Diabetes Program Recommendations  AACE/ADA: New Consensus Statement on Inpatient Glycemic Control   Target Ranges:  Prepandial:   less than 140 mg/dL      Peak postprandial:   less than 180 mg/dL (1-2 hours)      Critically ill patients:  140 - 180 mg/dL  Results for Natasha [name], RAHRIG (MRN [id number]) as of 02/25/2021 10:20  Ref. Range 02/24/2021 18:04 02/24/2021 21:30 02/25/2021 07:28 02/25/2021 8:48 02/25/2021 09:41  Glucose-Capillary Latest Ref Range: 70 - 99 mg/dL 82 181 (H) 419 (H)      Levemir 20 units 332 (H)  Novolog 15 units $Remove'@9'oreLImE$ :42   Review of Glycemic Control  Diabetes history: DM2 Outpatient Diabetes medications: Tresiba 15 units daily, Janumet [phone] mg daily Current orders for Inpatient glycemic control: Novolog 0-15 units TID with meals, Novolog 0-5 units QHS; Decadron 6 mg Q24H  Inpatient Diabetes Program Recommendations:    Insulin: Noted Levemir 20 units given at 8:48 am today (one time order). If steroids are continued as ordered, please consider ordering Levemir 20 units daily to start 02/26/21.  NOTE: In reviewing chart, noted patient sees Endocrinology at [facility] and last seen Dr. Gabriel Carina on 02/03/21.  Thanks, Barnie Alderman, RN, MSN, CDE Diabetes Coordinator Inpatient Diabetes Program [phone] (Team Pager from 8am to 5pm)

## 2021-02-26 DIAGNOSIS — U071 COVID-19: Secondary | ICD-10-CM | POA: Diagnosis not present

## 2021-02-26 LAB — GLUCOSE, CAPILLARY
Glucose-Capillary: 281 mg/dL — ABNORMAL HIGH (ref 70–99)
Glucose-Capillary: 339 mg/dL — ABNORMAL HIGH (ref 70–99)

## 2021-02-26 LAB — D-DIMER, QUANTITATIVE: D-Dimer, Quant: 0.57 ug/mL-FEU — ABNORMAL HIGH (ref 0.00–0.50)

## 2021-02-26 LAB — PHOSPHORUS: Phosphorus: 4.1 mg/dL (ref 2.5–4.6)

## 2021-02-26 LAB — MAGNESIUM: Magnesium: 1.9 mg/dL (ref 1.7–2.4)

## 2021-02-26 LAB — C-REACTIVE PROTEIN: CRP: 0.9 mg/dL (ref <1.0)

## 2021-02-26 LAB — FERRITIN: Ferritin: 27 ng/mL (ref 11–307)

## 2021-02-26 MED ORDER — PREDNISONE 20 MG PO TABS: 40.0000 mg | ORAL_TABLET | Freq: Every day | ORAL | 0 refills | Status: AC

## 2021-02-26 MED ORDER — APIXABAN 5 MG PO TABS: 5.0000 mg | ORAL_TABLET | Freq: Two times a day (BID) | ORAL | Status: DC

## 2021-02-26 MED ORDER — APIXABAN 2.5 MG PO TABS: 2.5000 mg | ORAL_TABLET | Freq: Two times a day (BID) | ORAL | Status: DC

## 2021-02-26 MED ORDER — AMIODARONE HCL 400 MG PO TABS: 400.0000 mg | ORAL_TABLET | Freq: Every day | ORAL | 0 refills | Status: DC

## 2021-02-26 NOTE — Discharge Summary (Signed)
Physician Discharge Summary  Natasha [name] YPP:[id number] DOB: 03-13-1941 DOA: 02/24/2021  PCP: Lesleigh Noe, MD  Admit date: 02/24/2021 Discharge date: 02/26/2021  Admitted From: Home Disposition: Home  Recommendations for Outpatient Follow-up:  Follow up with PCP in 1-2 weeks Follow-up with cardiology in 3 to 4 weeks  Home Health: No Equipment/Devices: None  Discharge Condition: Stable CODE STATUS: Full Diet recommendation: Regular  Brief/Interim Summary:  Natasha [name] is a 80 y.o. female with medical history of fibrillation on anticoagulation, mild aortic stenosis, diabetes mellitus type 2, chronic kidney disease stage III, lung cancer currently receiving radiation therapy. She presented to the ED for dyspnea and is found to have COVID 19 and A-flutter with RVR. Started on a Cardizem infusion and also treated with Metoprolol. She converted to NSR.   Received 3 days of remdesivir in house.  Stable at time of discharge.  Has some cough but states this is chronic.  States that she was told by her radiation oncologist to not take cough medication with the exception of Mucinex.  I did discuss with her Tessalon Perles.  Will not prescribe at time of discharge.  Will defer to outpatient providers.  Per cardiology recommendations we will discharge on amiodarone 400 mg daily.  Will need follow-up in 3 to 4 weeks.   Discharge Diagnoses:  Principal Problem:   COVID-19 virus infection Active Problems:   Chronic kidney disease with symptom management only, stage 3 (moderate) (HCC)   Essential hypertension   OSA (obstructive sleep apnea)   Spinal stenosis of lumbar region without neurogenic claudication   Type 2 diabetes mellitus with other specified complication (HCC)   History of gastric bypass   Atrial flutter ([place])  COVID-19 virus infection Completed 3 days of remdesivir in house.  On room air at time of discharge.  Will recommend additional 5 days prednisone.  Prescribed on  discharge.   Active Problems: Atrial flutter ([place]) - with RVR - has converted to NSR now - has been started on Amiodarone 400 mg daily by cardiology and will  be discharged on this.  Follow-up outpatient cardiology Dr. 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  Outpatient follow-up   Discharge Instructions  Discharge Instructions     Diet - low sodium heart healthy   Complete by: As directed    Increase activity slowly   Complete by: As directed       Allergies as of 02/26/2021       Reactions   Aspirin Hives   Atorvastatin    Muscle/joint aches   Lantus [insulin Glargine] Hives   Lisinopril Hives   Cough         Medication List     TAKE these medications    amiodarone 400 MG tablet Commonly known as: PACERONE Take 1 tablet (400 mg total) by mouth daily. Start taking on: February 27, 2021   apixaban 5 MG Tabs tablet Commonly known as: Eliquis Take 1 tablet (5 mg total) by mouth 2 (two) times daily.   CALCIUM 600+D3 PO Take 2 tablets by mouth in the morning.   Contour Next Test test strip Generic drug: glucose blood Check sugar twice daily DX E11.69   IRON 27 PO Take 27 mg by mouth in the morning.   Janumet 50-1000 MG tablet Generic drug: sitaGLIPtin-metformin Take 2 tablets by mouth in the morning.   multivitamin with  minerals Tabs tablet Take 1 tablet by mouth in the morning.   pramipexole 1 MG tablet Commonly known as: MIRAPEX Take 1 tablet (1 mg total) by mouth at bedtime. Appt with PCP needed for further refills.   predniSONE 20 MG tablet Commonly known as: DELTASONE Take 2 tablets (40 mg total) by mouth daily for 5 days. Start taking on: February 27, 2021   pregabalin 25 MG capsule Commonly known as: LYRICA Take 1 capsule by mouth in the morning and at bedtime.   rosuvastatin 20 MG tablet Commonly known as:  CRESTOR Take 1 tablet (20 mg total) by mouth at bedtime.   sertraline 50 MG tablet Commonly known as: ZOLOFT TAKE 1 TABLET AT BEDTIME. PT NEEDS FOLLOW-UP APPT WITH PCP FOR FURTHER REFILLS.   Tyler Aas FlexTouch 100 UNIT/ML FlexTouch Pen Generic drug: insulin degludec Inject 15 Units into the skin at bedtime.   vitamin E 180 MG (400 UNITS) capsule Take 400 Units by mouth daily.        Follow-up Information     Dionisio David, MD. Schedule an appointment as soon as possible for a visit in 1 week(s).   Specialty: Cardiology Why: left message for them to make an appointment Contact information: [place] [address] [phone]                Allergies  Allergen Reactions   Aspirin Hives   Atorvastatin     Muscle/joint aches   Lantus [Insulin Glargine] Hives   Lisinopril Hives    Cough      Consultations: Cardiology   Procedures/Studies: DG Chest 2 View  Result Date: 02/24/2021 CLINICAL DATA:  Shortness of breath, chills and fever beginning yesterday. EXAM: CHEST - 2 VIEW COMPARISON:  09/30/2020 FINDINGS: Heart size is normal. Ordinary mild aortic atherosclerotic calcification is present. The lungs are clear. No infiltrate, collapse or effusion. Chronic compression fracture of a midthoracic vertebral body. No acute bone finding. IMPRESSION: No active cardiopulmonary disease. Electronically Signed   By: Nelson Chimes M.D.   On: 02/24/2021 15:07   CT Angio Chest PE W/Cm &/Or Wo Cm  Result Date: 02/24/2021 CLINICAL DATA:  Short of breath. EXAM: CT ANGIOGRAPHY CHEST WITH CONTRAST TECHNIQUE: Multidetector CT imaging of the chest was performed using the standard protocol during bolus administration of intravenous contrast. Multiplanar CT image reconstructions and MIPs were obtained to evaluate the vascular anatomy. CONTRAST:  59mL OMNIPAQUE IOHEXOL 350 MG/ML SOLN COMPARISON:  09/04/2020. FINDINGS: Cardiovascular: Pulmonary arteries are well opacified.  There is no evidence of a pulmonary embolism. Heart is normal in size. Trace pericardial effusion. Circumflex coronary artery calcifications. Great vessels normal in caliber. Mild aortic atherosclerosis. No dissection. Mediastinum/Nodes: No neck base, mediastinal or hilar masses or enlarged lymph nodes. Trachea and esophagus are unremarkable. Lungs/Pleura: Spiculated left upper lobe nodule measures 16 x 17 x 16 mm, previously 2.1 x 1.9 cm transversely. No other nodules. Several linear opacities in the lower lungs consistent with atelectasis and/or scarring, similar to the prior CT. Remainder of the lungs is clear. No pleural effusion or pneumothorax. Upper Abdomen: Previous gastric bypass procedure. No acute findings in the visualized upper abdomen. Musculoskeletal: No acute fracture. Chronic fracture of T7. Mild depression of the upper endplate of C7 that may reflect a chronic fracture or be degenerative in origin. No osteoblastic or osteolytic lesions. No chest wall masses. Review of the MIP images confirms the above findings. IMPRESSION: 1. No evidence of a pulmonary embolism. 2. Decrease in the size  of the left upper lobe spiculated nodule. No new lung nodules. 3. No acute findings in the lungs. 4. Mild aortic atherosclerosis. Aortic Atherosclerosis (ICD10-I70.0) and Emphysema (ICD10-J43.9). Electronically Signed   By: Lajean Manes M.D.   On: 02/24/2021 16:18   ECHOCARDIOGRAM COMPLETE  Result Date: 02/25/2021    ECHOCARDIOGRAM REPORT   Patient Name:   [facility] Date of Exam: 02/25/2021 Medical Rec #:  [id number]     Height:       64.0 in Accession #:    [id number]    Weight:       167.5 lb Date of Birth:  Nov 18, 1940     BSA:          1.815 m Patient Age:    78 years      BP:           104/65 mmHg Patient Gender: F             HR:           89 bpm. Exam Location:  [facility] Procedure: 2D Echo, Color Doppler, Cardiac Doppler and Intracardiac            Opacification Agent Indications:     I48.92 Atrial flutter   History:         Patient has prior history of Echocardiogram examinations. CKD;                  Risk Factors:Hypertension, HCL, Diabetes and Sleep Apnea.  Sonographer:     Charmayne Sheer Referring Phys:  [place] Diagnosing Phys: Neoma Laming  Sonographer Comments: Technically challenging study due to limited acoustic windows and Technically difficult study due to poor echo windows. Image acquisition challenging due to respiratory motion. IMPRESSIONS  1. Left ventricular ejection fraction, by estimation, is 60 to 65%. The left ventricle has normal function. The left ventricle has no regional wall motion abnormalities. There is mild concentric left ventricular hypertrophy. Left ventricular diastolic parameters are consistent with Grade I diastolic dysfunction (impaired relaxation).  2. Right ventricular systolic function is normal. The right ventricular size is normal.  3. Left atrial size was mildly dilated.  4. The mitral valve is normal in structure. No evidence of mitral valve regurgitation. No evidence of mitral stenosis.  5. The aortic valve is normal in structure. Aortic valve regurgitation is not visualized. No aortic stenosis is present.  6. The inferior vena cava is normal in size with greater than 50% respiratory variability, suggesting right atrial pressure of 3 mmHg. FINDINGS  Left Ventricle: Left ventricular ejection fraction, by estimation, is 60 to 65%. The left ventricle has normal function. The left ventricle has no regional wall motion abnormalities. Definity contrast agent was given IV to delineate the left ventricular  endocardial borders. The left ventricular internal cavity size was normal in size. There is mild concentric left ventricular hypertrophy. Left ventricular diastolic parameters are consistent with Grade I diastolic dysfunction (impaired relaxation). Right Ventricle: The right ventricular size is normal. No increase in right ventricular wall thickness. Right ventricular  systolic function is normal. Left Atrium: Left atrial size was mildly dilated. Right Atrium: Right atrial size was normal in size. Pericardium: There is no evidence of pericardial effusion. Mitral Valve: The mitral valve is normal in structure. No evidence of mitral valve regurgitation. No evidence of mitral valve stenosis. MV peak gradient, 3.6 mmHg. The mean mitral valve gradient is 2.0 mmHg. Tricuspid Valve: The tricuspid valve is normal in structure. Tricuspid valve regurgitation is  not demonstrated. No evidence of tricuspid stenosis. Aortic Valve: The aortic valve is normal in structure. Aortic valve regurgitation is not visualized. No aortic stenosis is present. Aortic valve mean gradient measures 5.0 mmHg. Aortic valve peak gradient measures 8.4 mmHg. Aortic valve area, by VTI measures 2.11 cm. Pulmonic Valve: The pulmonic valve was normal in structure. Pulmonic valve regurgitation is not visualized. No evidence of pulmonic stenosis. Aorta: The aortic root is normal in size and structure. Venous: The inferior vena cava is normal in size with greater than 50% respiratory variability, suggesting right atrial pressure of 3 mmHg. IAS/Shunts: No atrial level shunt detected by color flow Doppler.  LEFT VENTRICLE PLAX 2D LVOT diam:     2.20 cm     Diastology LV SV:         57          LV e' medial:    8.49 cm/s LV SV Index:   31          LV E/e' medial:  10.4 LVOT Area:     3.80 cm    LV e' lateral:   7.72 cm/s                            LV E/e' lateral: 11.4  LV Volumes (MOD) LV vol d, MOD A4C: 51.1 ml LV vol s, MOD A4C: 16.7 ml LV SV MOD A4C:     51.1 ml AORTIC VALVE AV Area (Vmax):    1.98 cm AV Area (Vmean):   1.99 cm AV Area (VTI):     2.11 cm AV Vmax:           145.00 cm/s AV Vmean:          102.000 cm/s AV VTI:            0.269 m AV Peak Grad:      8.4 mmHg AV Mean Grad:      5.0 mmHg LVOT Vmax:         75.70 cm/s LVOT Vmean:        53.400 cm/s LVOT VTI:          0.149 m LVOT/AV VTI ratio: 0.55  AORTA Ao  Root diam: 3.40 cm MITRAL VALVE MV Area (PHT): 2.91 cm    SHUNTS MV Area VTI:   1.93 cm    Systemic VTI:  0.15 m MV Peak grad:  3.6 mmHg    Systemic Diam: 2.20 cm MV Mean grad:  2.0 mmHg MV Vmax:       0.95 m/s MV Vmean:      63.1 cm/s MV Decel Time: 261 msec MV E velocity: 88.30 cm/s MV A velocity: 87.80 cm/s MV E/A ratio:  1.01 Shaukat Khan Electronically signed by Neoma Laming Signature Date/Time: 02/25/2021/1:33:58 PM    Final       Subjective: Seen and examined on the day of discharge.  Vital signs stable.  Patient with some chronic cough but otherwise asymptomatic.  Discharge Exam: Vitals:   02/25/21 2305 02/26/21 0835  BP:  129/78  Pulse:  63  Resp: 20 18  Temp:  98.1 F (36.7 C)  SpO2:  98%   Vitals:   02/25/21 2000 02/25/21 2100 02/25/21 2305 02/26/21 0835  BP: 131/73 (!) 148/94  129/78  Pulse: 64 65  63  Resp: $Remo'20 19 20 18  'FpmSf$ Temp:  (!) 96.8 F (36 C)  98.1 F (36.7 C)  TempSrc:  Oral  Oral  SpO2: 93% 96%  98%  Weight:      Height:        General: Pt is alert, awake, not in acute distress Cardiovascular: RRR, S1/S2 +, no rubs, no gallops Respiratory: CTA bilaterally, no wheezing, no rhonchi Abdominal: Soft, NT, ND, bowel sounds + Extremities: no edema, no cyanosis    The results of significant diagnostics from this hospitalization (including imaging, microbiology, ancillary and laboratory) are listed below for reference.     Microbiology: Recent Results (from the past 240 hour(s))  MRSA Next Gen by PCR, Nasal     Status: None   Collection Time: 02/24/21  6:04 PM   Specimen: Nasal Mucosa; Nasal Swab  Result Value Ref Range Status   MRSA by PCR Next Gen NOT DETECTED NOT DETECTED Final    Comment: (NOTE) The GeneXpert MRSA Assay (FDA approved for NASAL specimens only), is one component of a comprehensive MRSA colonization surveillance program. It is not intended to diagnose MRSA infection nor to guide or monitor treatment for MRSA infections. Test  performance is not FDA approved in patients less than 37 years old. Performed at [facility], [address] [place], [place] [address]      Labs: BNP (last 3 results) No results for input(s): BNP in the last 8760 hours. Basic Metabolic Panel: Recent Labs  Lab 02/24/21 1424 02/25/21 0416 02/26/21 0612  NA 135 133*  --   K 4.9 4.7  --   CL 106 104  --   CO2 25 21*  --   GLUCOSE 133* 422*  --   BUN 23 28*  --   CREATININE 1.62* 1.56*  --   CALCIUM 9.1 8.3*  --   MG  --  1.7 1.9  PHOS  --  4.6 4.1   Liver Function Tests: Recent Labs  Lab 02/24/21 1424  AST 31  ALT 18  ALKPHOS 54  BILITOT 0.8  PROT 6.9  ALBUMIN 4.1   No results for input(s): LIPASE, AMYLASE in the last 168 hours. No results for input(s): AMMONIA in the last 168 hours. CBC: Recent Labs  Lab 02/24/21 1424 02/25/21 0416  WBC 7.6 4.3  NEUTROABS 5.4  --   HGB 11.9* 9.8*  HCT 34.6* 29.4*  MCV 85.9 87.5  PLT 245 195   Cardiac Enzymes: No results for input(s): CKTOTAL, CKMB, CKMBINDEX, TROPONINI in the last 168 hours. BNP: Invalid input(s): POCBNP CBG: Recent Labs  Lab 02/25/21 1239 02/25/21 1701 02/25/21 2102 02/26/21 0830 02/26/21 1145  GLUCAP 331* 226* 111* 281* 339*   D-Dimer Recent Labs    02/25/21 0416 02/26/21 0612  DDIMER 0.72* 0.57*   Hgb A1c Recent Labs    02/24/21 1424  HGBA1C 9.4*   Lipid Profile No results for input(s): CHOL, HDL, LDLCALC, TRIG, CHOLHDL, LDLDIRECT in the last 72 hours. Thyroid function studies No results for input(s): TSH, T4TOTAL, T3FREE, THYROIDAB in the last 72 hours.  Invalid input(s): FREET3 Anemia work up [employer]    02/25/21 0416 02/26/21 0612  FERRITIN 27 27   Urinalysis No results found for: COLORURINE, APPEARANCEUR, LABSPEC, [place], GLUCOSEU, [place], [place], [place], [place], UROBILINOGEN, NITRITE, LEUKOCYTESUR Sepsis Labs Invalid input(s): PROCALCITONIN,  WBC,  LACTICIDVEN Microbiology Recent Results  (from the past 240 hour(s))  MRSA Next Gen by PCR, Nasal     Status: None   Collection Time: 02/24/21  6:04 PM   Specimen: Nasal Mucosa; Nasal Swab  Result Value Ref Range Status   MRSA by PCR  Next Gen NOT DETECTED NOT DETECTED Final    Comment: (NOTE) The GeneXpert MRSA Assay (FDA approved for NASAL specimens only), is one component of a comprehensive MRSA colonization surveillance program. It is not intended to diagnose MRSA infection nor to guide or monitor treatment for MRSA infections. Test performance is not FDA approved in patients less than 25 years old. Performed at [facility], [address], [place], [place] [address]      Time coordinating discharge: Over 30 minutes  SIGNED:   Sidney Ace, MD  [facility] 02/26/2021, 1:23 PM Pager   If 7PM-7AM, please contact night-coverage

## 2021-02-26 NOTE — Progress Notes (Signed)
SUBJECTIVE: This is a 80 year old white female with a history of atrial fibrillation and anemia aortic stenosis who presented to the hospital with COVID infection and was found to be in atrial fibrillation with rapid ventricular response rate about 140/min. Converted to sinus rhythm after getting IV amiodarone. Now on amiodarone 400 mg p.o. once a day.  Patient resting comfortably in bed, eating breakfast. States she feels worse today, back pain from coughing. Denies chest pain.    Vitals:   02/25/21 2000 02/25/21 2100 02/25/21 2305 02/26/21 0835  BP: 131/73 (!) 148/94  129/78  Pulse: 64 65  63  Resp: 20 19 20 18   Temp:  (!) 96.8 F (36 C)  98.1 F (36.7 C)  TempSrc:  Oral  Oral  SpO2: 93% 96%  98%  Weight:      Height:        Intake/Output Summary (Last 24 hours) at 02/26/2021 1023 Last data filed at 02/25/2021 1300 Gross per 24 hour  Intake 200 ml  Output --  Net 200 ml    LABS: Basic Metabolic Panel: Recent Labs    02/24/21 1424 02/25/21 0416 02/26/21 0612  NA 135 133*  --   K 4.9 4.7  --   CL 106 104  --   CO2 25 21*  --   GLUCOSE 133* 422*  --   BUN 23 28*  --   CREATININE 1.62* 1.56*  --   CALCIUM 9.1 8.3*  --   MG  --  1.7 1.9  PHOS  --  4.6 4.1   Liver Function Tests: Recent Labs    02/24/21 1424  AST 31  ALT 18  ALKPHOS 54  BILITOT 0.8  PROT 6.9  ALBUMIN 4.1   No results for input(s): LIPASE, AMYLASE in the last 72 hours. CBC: Recent Labs    02/24/21 1424 02/25/21 0416  WBC 7.6 4.3  NEUTROABS 5.4  --   HGB 11.9* 9.8*  HCT 34.6* 29.4*  MCV 85.9 87.5  PLT 245 195   Cardiac Enzymes: No results for input(s): CKTOTAL, CKMB, CKMBINDEX, TROPONINI in the last 72 hours. BNP: Invalid input(s): POCBNP D-Dimer: Recent Labs    02/25/21 0416 02/26/21 0612  DDIMER 0.72* 0.57*   Hemoglobin A1C: Recent Labs    02/24/21 1424  HGBA1C 9.4*   Fasting Lipid Panel: No results for input(s): CHOL, HDL, LDLCALC, TRIG, CHOLHDL, LDLDIRECT in the last  72 hours. Thyroid Function Tests: No results for input(s): TSH, T4TOTAL, T3FREE, THYROIDAB in the last 72 hours.  Invalid input(s): FREET3 Anemia Panel: Recent Labs    02/26/21 0612  FERRITIN 27     PHYSICAL EXAM General: Well developed, well nourished, in no acute distress HEENT:  Normocephalic and atramatic Neck:  No JVD.  Lungs: Clear bilaterally to auscultation and percussion. Heart: HRRR . Normal S1 and S2 without gallops or murmurs.  Abdomen: Bowel sounds are positive, abdomen soft and non-tender  Msk:  Back normal, normal gait. Normal strength and tone for age. Extremities: No clubbing, cyanosis or edema.   Neuro: Alert and oriented X 3. Psych:  Good affect, responds appropriately  TELEMETRY: NSR, HR 70 bpm  ASSESSMENT AND PLAN: Patient in normal sinus rhythm. Patient should be discharged on amiodarone 400 mg daily with a follow up as outpatient once Covid free.  Principal Problem:   COVID-19 virus infection Active Problems:   Chronic kidney disease with symptom management only, stage 3 (moderate) (HCC)   Essential hypertension   OSA (obstructive sleep apnea)  Spinal stenosis of lumbar region without neurogenic claudication   Type 2 diabetes mellitus with other specified complication ([place])   History of gastric bypass   Atrial flutter ([place])    Cleophas Yoak, FNP-C 02/26/2021 10:23 AM

## 2021-02-28 ENCOUNTER — Telehealth: Payer: Self-pay

## 2021-02-28 NOTE — Telephone Encounter (Signed)
Transition Care Management Follow-up Telephone Call Date of discharge and from where:  How have you been since you were released from the hospital? " Doing well" Any questions or concerns? No  Items Reviewed: Did the pt receive and understand the discharge instructions provided? Yes  Medications obtained and verified? Yes  Other? No  Any new allergies since your discharge? No  Dietary orders reviewed? Yes Do you have support at home? Yes   Home Care and Equipment/Supplies: Were home health services ordered? not applicable If so, what is the name of the agency?  Has the agency set up a time to come to the patient's home?  Were any new equipment or medical supplies ordered?   What is the name of the medical supply agency?  Were you able to get the supplies/equipment?  Do you have any questions related to the use of the equipment or supplies?   Functional Questionnaire: (I = Independent and D = Dependent) ADLs: I  Bathing/Dressing- I  Meal Prep- I  Eating- I  Maintaining continence- I  Transferring/Ambulation- I  Managing Meds- I  Follow up appointments reviewed:  PCP Hospital f/u appt confirmed? No   Specialist Hospital f/u appt confirmed?  No Are transportation arrangements needed? No  If their condition worsens, is the pt aware to call PCP or go to the Emergency Dept.? yes Was the patient provided with contact information for the PCP's office or ED? Yes Was to pt encouraged to call back with questions or concerns? Yes   Tomasa Rand, RN, BSN, CEN [facility] [employer] [phone]

## 2021-03-06 ENCOUNTER — Telehealth: Payer: Self-pay

## 2021-03-06 NOTE — Telephone Encounter (Signed)
Noted  

## 2021-03-06 NOTE — Telephone Encounter (Signed)
Agree with cardiology f/u - it does not appear his has been scheduled.   If patient has cardiology f/u and is doing well no need for PCP appointment unless not improving.   Will route to patient cardiologist to see if his team can get her scheduled.

## 2021-03-06 NOTE — Telephone Encounter (Signed)
From [place], Signe Colt, who works with quality improvement and helping monitor hospital discharges.  Patient was discharged from inpatient setting due to [place] 19 and other comorbidities including age. It was recommended that patient follow up with Cardiology in 1 week, but patient may benefit from PCP visit as well.  Will send to PCP for recommendation if hospital f/u is necessary.

## 2021-03-07 NOTE — Telephone Encounter (Signed)
Attempted to schedule no ans no vm  

## 2021-03-09 ENCOUNTER — Other Ambulatory Visit: Payer: Self-pay

## 2021-03-09 ENCOUNTER — Emergency Department: Payer: Medicare Other

## 2021-03-09 ENCOUNTER — Emergency Department
Admission: EM | Admit: 2021-03-09 | Discharge: 2021-03-09 | Disposition: A | Discharge status: Home / Self Care | Payer: Medicare Other | Attending: Emergency Medicine | Admitting: Emergency Medicine

## 2021-03-09 DIAGNOSIS — J4520 Mild intermittent asthma, uncomplicated: Secondary | ICD-10-CM | POA: Diagnosis not present

## 2021-03-09 DIAGNOSIS — Z7901 Long term (current) use of anticoagulants: Secondary | ICD-10-CM | POA: Insufficient documentation

## 2021-03-09 DIAGNOSIS — E1122 Type 2 diabetes mellitus with diabetic chronic kidney disease: Secondary | ICD-10-CM | POA: Insufficient documentation

## 2021-03-09 DIAGNOSIS — N183 Chronic kidney disease, stage 3 unspecified: Secondary | ICD-10-CM | POA: Diagnosis not present

## 2021-03-09 DIAGNOSIS — Z96612 Presence of left artificial shoulder joint: Secondary | ICD-10-CM | POA: Insufficient documentation

## 2021-03-09 DIAGNOSIS — Z8616 Personal history of COVID-19: Secondary | ICD-10-CM | POA: Diagnosis not present

## 2021-03-09 DIAGNOSIS — I129 Hypertensive chronic kidney disease with stage 1 through stage 4 chronic kidney disease, or unspecified chronic kidney disease: Secondary | ICD-10-CM | POA: Insufficient documentation

## 2021-03-09 DIAGNOSIS — Z85118 Personal history of other malignant neoplasm of bronchus and lung: Secondary | ICD-10-CM | POA: Insufficient documentation

## 2021-03-09 DIAGNOSIS — H532 Diplopia: Secondary | ICD-10-CM | POA: Insufficient documentation

## 2021-03-09 DIAGNOSIS — Z87891 Personal history of nicotine dependence: Secondary | ICD-10-CM | POA: Insufficient documentation

## 2021-03-09 DIAGNOSIS — Z79899 Other long term (current) drug therapy: Secondary | ICD-10-CM | POA: Diagnosis not present

## 2021-03-09 DIAGNOSIS — R42 Dizziness and giddiness: Secondary | ICD-10-CM

## 2021-03-09 LAB — CBC
HCT: 32.8 % — ABNORMAL LOW (ref 36.0–46.0)
Hemoglobin: 11 g/dL — ABNORMAL LOW (ref 12.0–15.0)
MCH: 28.7 pg (ref 26.0–34.0)
MCHC: 33.5 g/dL (ref 30.0–36.0)
MCV: 85.6 fL (ref 80.0–100.0)
Platelets: 264 10*3/uL (ref 150–400)
RBC: 3.83 MIL/uL — ABNORMAL LOW (ref 3.87–5.11)
RDW: 14.3 % (ref 11.5–15.5)
WBC: 9.8 10*3/uL (ref 4.0–10.5)
nRBC: 0 % (ref 0.0–0.2)

## 2021-03-09 LAB — BASIC METABOLIC PANEL
Anion gap: 10 (ref 5–15)
BUN: 23 mg/dL (ref 8–23)
CO2: 19 mmol/L — ABNORMAL LOW (ref 22–32)
Calcium: 8.3 mg/dL — ABNORMAL LOW (ref 8.9–10.3)
Chloride: 108 mmol/L (ref 98–111)
Creatinine, Ser: 1.57 mg/dL — ABNORMAL HIGH (ref 0.44–1.00)
GFR, Estimated: 33 mL/min — ABNORMAL LOW (ref >60)
Glucose, Bld: 198 mg/dL — ABNORMAL HIGH (ref 70–99)
Potassium: 3.8 mmol/L (ref 3.5–5.1)
Sodium: 137 mmol/L (ref 135–145)

## 2021-03-09 MED ORDER — SODIUM CHLORIDE 0.9 % IV BOLUS
1000.0000 mL | Freq: Once | INTRAVENOUS | Status: AC
  Administered 2021-03-09: 1000 mL via INTRAVENOUS

## 2021-03-09 NOTE — ED Provider Notes (Signed)
[facility] Emergency Department Provider Note  ____________________________________________   Event Date/Time   First MD Initiated Contact with Patient 03/09/21 2019     (approximate)  I have reviewed the triage vital signs and the nursing notes.   HISTORY  Chief Complaint Dizziness    HPI Natasha [name] is a 80 y.o. female with past medical history as below here with dizziness.  The patient currently is recovering from [place].  She has had approximately 2 weeks of symptoms.  She states that she has been on prednisone.  She states that over the last 5 days or so, she has had persistent dizziness and loss of balance.  She has had difficulty walking due to this.  She reports that she has felt like she is drunk when she is walking and has had to brace herself.  This is all new for her.  She is also noticed what she describes as double vision in her peripheral vision.  This is also new.  No focal numbness or weakness.  No difficulty speaking or swallowing.  No other complaints.  No history of stroke.  She does have a history of A. fib.    Past Medical History:  Diagnosis Date   A-fib ([place])    Anemia    Aortic atherosclerosis (HCC)    Aortic stenosis, mild    Asthma    mild   Chronic cough    CKD (chronic kidney disease), stage III (HCC)    Diverticulitis    High cholesterol    History of hiatal hernia    Hypertension    Insomnia    Murmur    Restless leg    Sleep apnea    T2DM (type 2 diabetes mellitus) ([place])     Patient Active Problem List   Diagnosis Date Noted   COVID-19 virus infection 02/24/2021   Atrial flutter ([place]) 02/24/2021   Chronic bilateral low back pain with bilateral sciatica 02/17/2021   Decreased sensation 11/27/2020   Dermatitis 11/27/2020   Axillary lymphadenopathy 10/11/2020   Cancer of upper lobe of left lung ([place]) 10/11/2020   Goals of care, counseling/discussion 10/11/2020   Status post reverse total shoulder  replacement, 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   Diverticulitis of colon 02/03/2020   Chronic pain syndrome 01/12/2019   Neuropathic pain 01/12/2019   At high risk for falls 11/02/2018   Neuroforaminal stenosis of lumbar spine 11/02/2018   Sacroiliitis ([place]) 11/02/2018   Former smoker 09/23/2018   Spinal stenosis of lumbar region without neurogenic claudication 09/23/2018   Weakness of both hands 09/23/2018   Overweight (BMI 25.0-29.9) 08/10/2018   Insomnia 06/17/2018   Chronic kidney disease with symptom management only, stage 3 (moderate) ([facility]) 05/27/2018   Nonrheumatic aortic valve stenosis 05/24/2018   Normocytic anemia 12/24/2017   Paroxysmal atrial fibrillation ([place]) 12/24/2017   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/24/2017   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/2017   Gastroesophageal reflux disease 11/17/2016   OSA (obstructive sleep apnea) 07/24/2016   Diverticulosis 07/21/2016   Osteopenia of multiple sites 06/04/2016   Closed compression fracture of thoracic vertebra ([place]) 04/02/2015   Mixed hyperlipidemia 04/02/2015   Restless leg syndrome 04/02/2015   Closed fracture of lateral portion of left tibial plateau 04/06/2014   Essential hypertension 04/06/2014   Mild intermittent asthma without complication 11/57/2620   Type 2 diabetes mellitus with other specified complication ([place]) 35/59/7416    Past Surgical History:  Procedure Laterality Date   ABDOMINAL HYSTERECTOMY  1978   APPENDECTOMY  1978   BREAST BIOPSY Left ?   papilloma   BREAST CYST EXCISION Bilateral yrs ago   benign, scars not well visualized   BREAST SURGERY     CATARACT EXTRACTION W/ INTRAOCULAR LENS  IMPLANT, BILATERAL Bilateral    CHOLECYSTECTOMY     COLONOSCOPY     ELBOW SURGERY Right    [place] release   EYE SURGERY     FRACTURE SURGERY     GASTRIC BYPASS  12/22/2017   JOINT REPLACEMENT     KNEE SURGERY Left    tibial fracture with metal plate   TOTAL SHOULDER REPLACEMENT Left 2014   VIDEO BRONCHOSCOPY WITH  ENDOBRONCHIAL NAVIGATION N/A 09/30/2020   Procedure: ROBOTIC ASSISTED VIDEO BRONCHOSCOPY WITH ENDOBRONCHIAL NAVIGATION;  Surgeon: Tyler Pita, MD;  Location: [facility] ORS;  Service: Pulmonary;  Laterality: N/A;   WRIST SURGERY Left    fractures    Prior to Admission medications   Medication Sig Start Date End Date Taking? Authorizing Provider  amiodarone (PACERONE) 400 MG tablet Take 1 tablet (400 mg total) by mouth daily. 02/27/21 03/29/21  Sidney Ace, MD  apixaban (ELIQUIS) 5 MG TABS tablet Take 1 tablet (5 mg total) by mouth 2 (two) times daily. 02/17/21   Lesleigh Noe, MD  Calcium Carb-Cholecalciferol (CALCIUM 600+D3 PO) Take 2 tablets by mouth in the morning.    [provider]  Ferrous Gluconate (IRON 27 PO) Take 27 mg by mouth in the morning.    [provider]  glucose blood (CONTOUR NEXT TEST) test strip Check sugar twice daily DX E11.69 02/24/21   Lesleigh Noe, MD  JANUMET 50-1000 MG tablet Take 2 tablets by mouth in the morning. 02/08/17   [provider]  Multiple Vitamin (MULTIVITAMIN WITH MINERALS) TABS tablet Take 1 tablet by mouth in the morning.    [provider]  pramipexole (MIRAPEX) 1 MG tablet Take 1 tablet (1 mg total) by mouth at bedtime. Appt with PCP needed for further refills. 11/27/20   Lesleigh Noe, MD  pregabalin (LYRICA) 25 MG capsule Take 1 capsule by mouth in the morning and at bedtime. 02/17/21   [provider]  rosuvastatin (CRESTOR) 20 MG tablet Take 1 tablet (20 mg total) by mouth at bedtime. 01/17/21   Lesleigh Noe, MD  sertraline (ZOLOFT) 50 MG tablet TAKE 1 TABLET AT BEDTIME. PT NEEDS FOLLOW-UP APPT WITH PCP FOR FURTHER REFILLS. 01/13/21   Lesleigh Noe, MD  TRESIBA FLEXTOUCH 100 UNIT/ML SOPN FlexTouch Pen Inject 15 Units into the skin at bedtime. 03/26/17   [provider]  vitamin E 180 MG (400 UNITS) capsule Take 400 Units by mouth daily.    [provider]     Allergies Aspirin, Atorvastatin, Lantus [insulin glargine], and Lisinopril  Family History  Problem Relation Age of Onset   Other Mother        died from surgery   AAA (abdominal aortic aneurysm) Mother    Diabetes Father        controlled by diet   Dementia Father        brain atrophy - unknown origin   Breast cancer Cousin        maternal    Social History Social History   Tobacco Use   Smoking status: Former    Packs/day: 1.00    Years: 12.00    Pack years: 12.00    Types: Cigarettes    Quit date: 05/26/1975    Years since quitting:  45.8   Smokeless tobacco: Never  Vaping Use   Vaping Use: Never used  Substance Use Topics   Alcohol use: No    Comment: rarely   Drug use: Never    Review of Systems  Review of Systems  Constitutional:  Negative for fatigue and fever.  HENT:  Negative for congestion and sore throat.   Eyes:  Positive for visual disturbance.  Respiratory:  Negative for cough and shortness of breath.   Cardiovascular:  Negative for chest pain.  Gastrointestinal:  Negative for abdominal pain, diarrhea, nausea and vomiting.  Genitourinary:  Negative for flank pain.  Musculoskeletal:  Negative for back pain and neck pain.  Skin:  Negative for rash and wound.  Neurological:  Positive for dizziness. Negative for weakness.  All other systems reviewed and are negative.   ____________________________________________  PHYSICAL EXAM:      VITAL SIGNS: ED Triage Vitals  Enc Vitals Group     BP 03/09/21 1653 114/74     Pulse Rate 03/09/21 1653 87     Resp 03/09/21 1653 18     Temp 03/09/21 1657 98.7 F (37.1 C)     Temp src --      SpO2 03/09/21 1653 95 %     Weight 03/09/21 1654 156 lb (70.8 kg)     Height 03/09/21 1654 5\' 4"  (1.626 m)     Head Circumference --      Peak Flow --      Pain Score 03/09/21 1653 0     Pain Loc --      Pain Edu? --      Excl. in GC? --      Physical Exam Vitals and nursing note reviewed.  Constitutional:       General: She is not in acute distress.    Appearance: She is well-developed.  HENT:     Head: Normocephalic and atraumatic.  Eyes:     Conjunctiva/sclera: Conjunctivae normal.  Cardiovascular:     Rate and Rhythm: Normal rate and regular rhythm.     Heart sounds: Normal heart sounds.  Pulmonary:     Effort: Pulmonary effort is normal. No respiratory distress.     Breath sounds: No wheezing.  Abdominal:     General: There is no distension.  Musculoskeletal:     Cervical back: Neck supple.  Skin:    General: Skin is warm.     Capillary Refill: Capillary refill takes less than 2 seconds.     Findings: No rash.  Neurological:     Mental Status: She is alert and oriented to person, place, and time.     Motor: No abnormal muscle tone.     Comments: CN nerves II through XII intact. No overt nystagmus. Strength 5 out of 5 bilateral upper and lower extr. Normal sensation to light touch. Gait slightly ataxic but able to ambulate. HTS normal. No tremor. Normal tone throughout.      ____________________________________________   LABS (all labs ordered are listed, but only abnormal results are displayed)  Labs Reviewed  BASIC METABOLIC PANEL - Abnormal; Notable for the following components:      Result Value   CO2 19 (*)    Glucose, Bld 198 (*)    Creatinine, Ser 1.57 (*)    Calcium 8.3 (*)    GFR, Estimated 33 (*)    All other components within normal limits  CBC - Abnormal; Notable for the following components:   RBC 3.83 (*)  Hemoglobin 11.0 (*)    HCT 32.8 (*)    All other components within normal limits  CBG MONITORING, ED    ____________________________________________  EKG: Normal sensa normal sinus rhythm, ventricular rate 88.  PR 160, QRS 88, QTc 459.  No acute ST elevations or depressions.  No EKG evidence of acute ischemia 4.  Tion to light touch. ________________________________________  RADIOLOGY All imaging, including plain films, CT scans, and  ultrasounds, independently reviewed by me, and interpretations confirmed via formal radiology reads.  ED MD interpretation:   MR Brain: No acute abnormality  Official radiology report(s): MR BRAIN WO CONTRAST  Result Date: 03/09/2021 CLINICAL DATA:  Dizziness EXAM: MRI HEAD WITHOUT CONTRAST TECHNIQUE: Multiplanar, multiecho pulse sequences of the brain and surrounding structures were obtained without intravenous contrast. COMPARISON:  None. FINDINGS: Brain: No acute infarct, mass effect or extra-axial collection. No acute or chronic hemorrhage. There is multifocal hyperintense T2-weighted signal within the white matter. The midline structures are normal. Vascular: Major flow voids are preserved. Skull and upper cervical spine: Normal calvarium and skull base. Visualized upper cervical spine and soft tissues are normal. Sinuses/Orbits:No paranasal sinus fluid levels or advanced mucosal thickening. No mastoid or middle ear effusion. Normal orbits. IMPRESSION: 1. No acute intracranial abnormality. 2. Findings of chronic small vessel ischemia. Electronically Signed   By: Ulyses Jarred M.D.   On: 03/09/2021 22:57    ____________________________________________  PROCEDURES   Procedure(s) performed (including Critical Care):  Procedures  ____________________________________________  INITIAL IMPRESSION / MDM / [place] / ED COURSE  As part of my medical decision making, I reviewed the following data within the [place] notes reviewed and incorporated, Old chart reviewed, Notes from prior ED visits, and [place] Controlled Substance Database       *Natasha [name] was evaluated in Emergency Department on 03/10/2021 for the symptoms described in the history of present illness. She was evaluated in the context of the global COVID-19 pandemic, which necessitated consideration that the patient might be at risk for infection with the SARS-CoV-2 virus that causes COVID-19.  Institutional protocols and algorithms that pertain to the evaluation of patients at risk for COVID-19 are in a state of rapid change based on information released by regulatory bodies including the CDC and federal and state organizations. These policies and algorithms were followed during the patient's care in the ED.  Some ED evaluations and interventions may be delayed as a result of limited staffing during the pandemic.*     Medical Decision Making:  80 yo F here with dizziness, loss of balance. Suspect this is related to recent COVID diagnosis. Pt has no focal neuro deficits on exam. Labs show possible slight dehydration, for which she was given fluids. EKG nonischemic. Given report of dizziness and h/o AFib as well as recent COVID diagnosis, MR obtained and fortunately shows no evidence of stroke. Pt ambulatory in ED. She is not hypoxic and is otherwise well appearing, recently recovering from [place] admission and seems to be otherwise at baseline. No signs of PNA, UTI. Strength and sensation are intact in LE without signs of radiculopathy or significant neuropathy causing her reported balance issues.  Suspect mild gait disturbance/weakness/ataxia related to COVID-19 and possibly recent steroid use. Deconditioning also likely. Pt is ambulatory in ED and feels comfortable with managing sx from home, which I think is reasonable given reassuring labs and negative MRI. She will call her PCP tomorrow to discuss. I do think she would benefit from a  walker and PT, which I have ordered, but I've also asked her to discuss this and arrange w/ PCP. Return precautions given.  ____________________________________________  FINAL CLINICAL IMPRESSION(S) / ED DIAGNOSES  Final diagnoses:  Dizziness     MEDICATIONS GIVEN DURING THIS VISIT:  Medications  sodium chloride 0.9 % bolus 1,000 mL (0 mLs Intravenous Stopped 03/09/21 2323)     ED Discharge Orders          Ordered    Walker rolling         03/09/21 2323             Note:  This document was prepared using Dragon voice recognition software and may include unintentional dictation errors.   Duffy Bruce, MD 03/10/21 [phone]

## 2021-03-09 NOTE — Discharge Instructions (Addendum)
I would recommend calling Dr. Einar Pheasant FIRST THING in the morning.  Your dizziness is likely related to COVID.  I'd recommend a WALKER (which I've written a prescription for but should be arranged with Dr. Einar Pheasant) and PHYSICAL THERAPY.  PLEASE mention this to Dr. Einar Pheasant and arrange follow-up this week

## 2021-03-09 NOTE — ED Notes (Signed)
Patient return from MRI.

## 2021-03-09 NOTE — ED Notes (Signed)
Pt transported to MRI via stretcher.  

## 2021-03-09 NOTE — ED Triage Notes (Addendum)
Pt comes pov with dizziness and fatigue. Pt was covid pos two weeks ago. States dizziness started about 5 days ago with severe fatigue. Pt Aox4. Husband with pt. Pt has been on prednisone for about a week and has finished it.

## 2021-03-10 ENCOUNTER — Telehealth: Payer: Self-pay

## 2021-03-10 NOTE — Telephone Encounter (Signed)
Please have patient schedule ER/Hospital f/u visit.   ER physician recommended PT and walker to help with recovery. May be reasonable to discuss. If Natasha [name] PT needed - she will need appointment

## 2021-03-10 NOTE — Telephone Encounter (Signed)
[place] Night - Client TELEPHONE ADVICE RECORD [facility] Patient Name: Natasha [name] [facility] LIEF Gender: Female DOB: 09/17/1940 Age: 80 Y 11 M 5 D Return Phone Number: [phone] (Primary) Address: City/ State/ Zip: [place] [address] Client [facility] Night - Client Client Site [place] Physician Waunita Schooner- MD Contact Type Call Who Is Calling Patient / Member / Family / Caregiver Call Type Triage / Clinical Relationship To Patient Self Return Phone Number [phone] (Primary) Chief Complaint Dizziness Reason for Call Symptomatic / Request for [place] states she is COVID positive. She states she is so lightheaded that she can't even function, she feels as if she is drunk. Translation No Nurse Assessment Nurse: Valentino Nose, RN, [place] Date/Time (Eastern Time): 03/09/2021 4:14:07 PM Confirm and document reason for call. If symptomatic, describe symptoms. ---Caller states she tested positive 2 weeks ago and is still testing positive. States she is very lightheaded and tired. She states feels drunk. Unknown fever. No difficulty breathing. Does the patient have any new or worsening symptoms? ---Yes Will a triage be completed? ---Yes Related visit to physician within the last 2 weeks? ---No Does the PT have any chronic conditions? (i.e. diabetes, asthma, this includes High risk factors for pregnancy, etc.) ---Yes List chronic conditions. ---lung cancer-finished radiation in June, sleep apnea, DM Is this a behavioral health or substance abuse call? ---No Guidelines Guideline Title Affirmed Question Affirmed Notes Nurse Date/Time (Eastern Time) Dizziness - Lightheadedness SEVERE dizziness (e.g., unable to stand, requires support to walk, feels like passing out now) Valentino Nose, Therapist, sports, [place] 03/09/2021 4:15:58 PM Disp. Time  Eilene Ghazi Time) Disposition Final User PLEASE NOTE: All timestamps contained within this report are represented as [place] Standard Time. CONFIDENTIALTY NOTICE: This fax transmission is intended only for the addressee. It contains information that is legally privileged, confidential or otherwise protected from use or disclosure. If you are not the intended recipient, you are strictly prohibited from reviewing, disclosing, copying using or disseminating any of this information or taking any action in reliance on or regarding this information. If you have received this fax in error, please notify us immediately by telephone so that we can arrange for its return to [place]. Phone: [phone], Toll-Free: [phone], Fax: [phone] Page: 2 of 2 Call Id: [id number] 03/09/2021 4:18:01 PM Go to ED Now (or PCP triage) Yes Valentino Nose, RN, Arnetha Courser Disagree/Comply Comply Caller Understands Yes PreDisposition Call Doctor Care Advice Given Per Guideline GO TO ED NOW (OR PCP TRIAGE): ANOTHER ADULT SHOULD DRIVE: Referrals GO TO FACILITY UNDECIDED

## 2021-03-10 NOTE — Telephone Encounter (Signed)
Per chart review tab pt was seen at [facility] ED on 03/09/21. Sending note to Dr Einar Pheasant and [facility] CMA. Sending teams also to [place].

## 2021-03-10 NOTE — Telephone Encounter (Signed)
Called patient and got her scheduled for virtual visit on 10/20 due to she has covid

## 2021-03-13 ENCOUNTER — Encounter: Payer: Self-pay | Admitting: *Deleted

## 2021-03-13 ENCOUNTER — Encounter: Payer: Self-pay | Admitting: Family Medicine

## 2021-03-13 ENCOUNTER — Other Ambulatory Visit: Payer: Self-pay

## 2021-03-13 ENCOUNTER — Ambulatory Visit (INDEPENDENT_AMBULATORY_CARE_PROVIDER_SITE_OTHER): Payer: Medicare Other | Admitting: Family Medicine

## 2021-03-13 VITALS — BP 122/78 | HR 78 | Temp 96.6°F | Ht 64.0 in | Wt 156.1 lb

## 2021-03-13 DIAGNOSIS — Z794 Long term (current) use of insulin: Secondary | ICD-10-CM

## 2021-03-13 DIAGNOSIS — R42 Dizziness and giddiness: Secondary | ICD-10-CM | POA: Insufficient documentation

## 2021-03-13 DIAGNOSIS — H532 Diplopia: Secondary | ICD-10-CM | POA: Diagnosis not present

## 2021-03-13 DIAGNOSIS — E1169 Type 2 diabetes mellitus with other specified complication: Secondary | ICD-10-CM

## 2021-03-13 DIAGNOSIS — I4892 Unspecified atrial flutter: Secondary | ICD-10-CM | POA: Diagnosis not present

## 2021-03-13 NOTE — Assessment & Plan Note (Addendum)
Lab Results  Component Value Date   HGBA1C 9.4 (H) 02/24/2021   Poorly control but reports normal retinopathy exam last month. Follows with endocrine. Discussed monitoring and advised calling for CBG>180. Cont tresiba and janumet. Lower concern this is contributing to the blurry/focusing issue

## 2021-03-13 NOTE — Progress Notes (Signed)
Subjective:     Natasha [name] is a 80 y.o. female presenting for Hospitalization Follow-up (Pt states she is still dizzy.Marland Kitchen Tested negative for Covid this morning. )     HPI  #Dizziness - since getting covid - vision is "screwed up" - getting some blurry vision, has to concentrate to focus - was on prednisone which she finished 1 week ago - no improvement in dizziness/blurry vision  - endorses some lightheadedness - does not feel faint - nothing seems to make it worse - constant - feeling tired - exhausted - eating and drinking ok - trying to - sleep - is awake for 4-6 hours a day but stays  - no change in dizzy symptoms with laying down or standing - fluids - 2-3 glasses - taking amiodarone for for aflutter - appt next week with Dr. Chancy Milroy - dizziness/blurry vision not worse with head positioning  Cough is improving - still taking some mucinex   Double vision is up and down  #Diabetes - follows with endocrine - 70-120 in the morning - higher at night 200s right after meal -   Review of Systems  02/24/2021: Admission - Covid-19 and a-flutter w/ RVR - cardizem infusion and metoprolol. Remdesivir. Cards evaluated - start amiodarone 400 mg daily 03/09/2021: ER - dizziness and loss of balance - MRI w/o acute, small vessel disease. Dehydatration - given fluids  Social History   Tobacco Use  Smoking Status Former   Packs/day: 1.00   Years: 12.00   Pack years: 12.00   Types: Cigarettes   Quit date: 05/26/1975   Years since quitting: 45.8  Smokeless Tobacco Never        Objective:    BP Readings from Last 3 Encounters:  03/13/21 122/78  03/09/21 131/72  02/26/21 129/78   Wt Readings from Last 3 Encounters:  03/13/21 156 lb 2 oz (70.8 kg)  03/09/21 156 lb (70.8 kg)  02/24/21 167 lb 8.8 oz (76 kg)    BP 122/78   Pulse 78   Temp (!) 96.6 F (35.9 C) (Temporal)   Ht $R'5\' 4"'AU$  (1.626 m)   Wt 156 lb 2 oz (70.8 kg)   SpO2 96%   BMI 26.80 kg/m    Physical  Exam Constitutional:      General: She is not in acute distress.    Appearance: She is well-developed. She is not diaphoretic.  HENT:     Right Ear: External ear normal.     Left Ear: External ear normal.     Nose: Nose normal.  Eyes:     Extraocular Movements: Extraocular movements intact.     Left eye: Nystagmus (to the right with leftward gaze) present.     Conjunctiva/sclera: Conjunctivae normal.  Cardiovascular:     Rate and Rhythm: Normal rate and regular rhythm.     Heart sounds: Murmur heard.  Pulmonary:     Effort: Pulmonary effort is normal. No respiratory distress.     Breath sounds: Normal breath sounds. No wheezing.  Musculoskeletal:     Cervical back: Neck supple.  Skin:    General: Skin is warm and dry.     Capillary Refill: Capillary refill takes less than 2 seconds.  Neurological:     Mental Status: She is alert. Mental status is at baseline.  Psychiatric:        Mood and Affect: Mood normal.        Behavior: Behavior normal.          Assessment &  Plan:   Problem List Items Addressed This Visit       Cardiovascular and Mediastinum   Atrial flutter [facility])    Seeing cardiology next week. Rate controlled today. Cont amiodarone 400 mg and eliquis 5 mg. Appreciate cardiology support      Relevant Medications   amiodarone (PACERONE) 200 MG tablet     Endocrine   Type 2 diabetes mellitus with other specified complication (HCC)    Lab Results  Component Value Date   HGBA1C 9.4 (H) 02/24/2021  Poorly control but reports normal retinopathy exam last month. Follows with endocrine. Discussed monitoring and advised calling for CBG>180. Cont tresiba and janumet. Lower concern this is contributing to the blurry/focusing issue        Other   Double vision    Normal MRI. Nystagmus on exam. Advised vision check. PT for balance issues.       Relevant Orders   Ambulatory referral to Physical Therapy   Dizziness - Primary    Etiology unclear. Double vision  and nystagmus on exam. Referral to vestibular PT for evaluation and treatment of possible vertigo. Had a normal MRI which is reassuring that no stroke. May be Covid recovery but does not seem specifically related to position or poor intake. Will get labs to reassess for dehydation      Relevant Orders   CBC   Comprehensive metabolic panel   Ambulatory referral to Physical Therapy     Return if symptoms worsen or fail to improve.  Lesleigh Noe, MD  This visit occurred during the SARS-CoV-2 public health emergency.  Safety protocols were in place, including screening questions prior to the visit, additional usage of staff PPE, and extensive cleaning of exam room while observing appropriate contact time as indicated for disinfecting solutions.

## 2021-03-13 NOTE — Patient Instructions (Signed)
Diabetes - Continue to check sugars - Check 2 hours after a meal (instead of right after) the goal for this <160-180 - Call endocrinologist if sugar after eating remains >200  Dizziness - see eye doctor  - physical therapy

## 2021-03-13 NOTE — Assessment & Plan Note (Signed)
Etiology unclear. Double vision and nystagmus on exam. Referral to vestibular PT for evaluation and treatment of possible vertigo. Had a normal MRI which is reassuring that no stroke. May be Covid recovery but does not seem specifically related to position or poor intake. Will get labs to reassess for dehydation

## 2021-03-13 NOTE — Assessment & Plan Note (Signed)
Normal MRI. Nystagmus on exam. Advised vision check. PT for balance issues.

## 2021-03-13 NOTE — Telephone Encounter (Signed)
Spoke with patient - states she is following up with Dr Humphrey Rolls as her cardiologist - does not wish to schedule

## 2021-03-13 NOTE — Assessment & Plan Note (Signed)
Seeing cardiology next week. Rate controlled today. Cont amiodarone 400 mg and eliquis 5 mg. Appreciate cardiology support

## 2021-03-24 ENCOUNTER — Telehealth: Payer: Self-pay | Admitting: Cardiology

## 2021-03-24 NOTE — Telephone Encounter (Signed)
Called patient to schedule follow up Patient is following up with Dr Humphrey Rolls and does not wish to schedule Deleted recall

## 2021-04-03 ENCOUNTER — Telehealth: Payer: Self-pay | Admitting: Family Medicine

## 2021-04-03 NOTE — Telephone Encounter (Signed)
Pt called in requesting a referral to get back surgery wants to start the process . Would like a call back [phone]

## 2021-04-03 NOTE — Telephone Encounter (Signed)
Pt last saw Dr. Oleta Mouse with ortho. I would recommend going back there to discuss next steps as they are the ones who did her imaging   Natasha Mayhew, MD   [place], [place] [address]   [phone] (Work)

## 2021-04-04 NOTE — Telephone Encounter (Signed)
Spoke to pt and relayed message from Dr. Einar Pheasant. Pt states that she will pursue it that way.

## 2021-04-07 ENCOUNTER — Other Ambulatory Visit: Payer: Self-pay | Admitting: Family Medicine

## 2021-04-07 DIAGNOSIS — R4583 Excessive crying of child, adolescent or adult: Secondary | ICD-10-CM

## 2021-04-10 ENCOUNTER — Other Ambulatory Visit: Payer: Self-pay

## 2021-04-10 ENCOUNTER — Ambulatory Visit
Admission: RE | Admit: 2021-04-10 | Discharge: 2021-04-10 | Disposition: A | Discharge status: Home / Self Care | Payer: Medicare Other | Source: Ambulatory Visit | Attending: Radiation Oncology | Admitting: Radiation Oncology

## 2021-04-10 DIAGNOSIS — C3412 Malignant neoplasm of upper lobe, left bronchus or lung: Secondary | ICD-10-CM | POA: Insufficient documentation

## 2021-04-10 DIAGNOSIS — D1771 Benign lipomatous neoplasm of kidney: Secondary | ICD-10-CM

## 2021-04-10 HISTORY — DX: Benign lipomatous neoplasm of kidney: D17.71

## 2021-04-10 LAB — POCT I-STAT CREATININE: Creatinine, Ser: 1.7 mg/dL — ABNORMAL HIGH (ref 0.44–1.00)

## 2021-04-10 MED ORDER — IOHEXOL 300 MG/ML  SOLN
50.0000 mL | Freq: Once | INTRAMUSCULAR | Status: AC | PRN
  Administered 2021-04-10: 10:00:00 50 mL via INTRAVENOUS

## 2021-04-14 ENCOUNTER — Encounter: Payer: Self-pay | Admitting: Radiation Oncology

## 2021-04-14 ENCOUNTER — Other Ambulatory Visit: Payer: Self-pay

## 2021-04-14 ENCOUNTER — Other Ambulatory Visit: Payer: Self-pay | Admitting: *Deleted

## 2021-04-14 ENCOUNTER — Ambulatory Visit
Admission: RE | Admit: 2021-04-14 | Discharge: 2021-04-14 | Disposition: A | Discharge status: Home / Self Care | Payer: Medicare Other | Source: Ambulatory Visit | Attending: Radiation Oncology | Admitting: Radiation Oncology

## 2021-04-14 ENCOUNTER — Other Ambulatory Visit: Payer: Self-pay | Admitting: Orthopedic Surgery

## 2021-04-14 VITALS — BP 133/75 | HR 54 | Resp 16 | Wt 153.2 lb

## 2021-04-14 DIAGNOSIS — C3412 Malignant neoplasm of upper lobe, left bronchus or lung: Secondary | ICD-10-CM | POA: Insufficient documentation

## 2021-04-15 ENCOUNTER — Encounter
Admission: RE | Admit: 2021-04-15 | Discharge: 2021-04-15 | Disposition: A | Discharge status: Home / Self Care | Payer: Medicare Other | Source: Ambulatory Visit | Attending: Orthopedic Surgery | Admitting: Orthopedic Surgery

## 2021-04-15 ENCOUNTER — Telehealth: Payer: Self-pay | Admitting: *Deleted

## 2021-04-15 ENCOUNTER — Encounter: Payer: Self-pay | Admitting: Orthopedic Surgery

## 2021-04-15 HISTORY — DX: Age-related osteoporosis without current pathological fracture: M81.0

## 2021-04-15 HISTORY — DX: Sepsis, unspecified organism: A41.9

## 2021-04-15 NOTE — Patient Instructions (Addendum)
Your procedure is scheduled on: Tuesday, November 29 Report to the Registration Desk on the 1st floor of the [facility]. To find out your arrival time, please call [phone] between 1PM - 3PM on: Monday, November 28  REMEMBER: Instructions that are not followed completely may result in serious medical risk, up to and including death; or upon the discretion of your surgeon and anesthesiologist your surgery may need to be rescheduled.  Do not eat food after midnight the night before surgery.  No gum chewing, lozengers or hard candies.  You may however, drink water up to 2 hours before you are scheduled to arrive for your surgery. Do not drink anything within 2 hours of your scheduled arrival time.  ONLY TAKE THESE MEDICATIONS THE MORNING OF SURGERY WITH A SIP OF WATER:  Amiodarone Metoprolol  Stop janumet 2 days prior to surgery. Last day to take Janumet is Saturday, November 26. Resume AFTER surgery. Only take 1/2 (half) of your insulin dose the night before surgery. Only take 7.5 units of Tresiba the night before surgery.  Follow recommendations from Cardiologist, Pulmonologist or PCP regarding stopping Eliquis. According to Dr. Theodore Demark note; you do not need to stop the Eliquis.  One week prior to surgery: Stop Anti-inflammatories (NSAIDS) such as Advil, Aleve, Ibuprofen, Motrin, Naproxen, Naprosyn and Aspirin based products such as Excedrin, Goodys Powder, BC Powder. Stop ANY OVER THE COUNTER supplements until after surgery. You may however, continue to take Tylenol if needed for pain up until the day of surgery.  No Alcohol for 24 hours before or after surgery.  No Smoking including e-cigarettes for 24 hours prior to surgery.   On the morning of surgery brush your teeth with toothpaste and water, you may rinse your mouth with mouthwash if you wish. Do not swallow any toothpaste or mouthwash.  Use CHG Soap as directed on instruction sheet.  Do not wear jewelry, make-up,  hairpins, clips or nail polish.  Do not wear lotions, powders, or perfumes.   Do not shave body from the neck down 48 hours prior to surgery just in case you cut yourself which could leave a site for infection.  Also, freshly shaved skin may become irritated if using the CHG soap.  Contact lenses, hearing aids and dentures may not be worn into surgery.  Do not bring valuables to the hospital. [facility] is not responsible for any missing/lost belongings or valuables.   Bring your C-PAP to the hospital with you in case you may have to spend the night.   Notify your doctor if there is any change in your medical condition (cold, fever, infection).  Wear comfortable clothing (specific to your surgery type) to the hospital.  After surgery, you can help prevent lung complications by doing breathing exercises.  Take deep breaths and cough every 1-2 hours. Your doctor may order a device called an Incentive Spirometer to help you take deep breaths.  If you are being discharged the day of surgery, you will not be allowed to drive home. You will need a responsible adult (18 years or older) to drive you home and stay with you that night.   If you are taking public transportation, you will need to have a responsible adult (18 years or older) with you. Please confirm with your physician that it is acceptable to use public transportation.   Please call the [place] Dept. at [phone] if you have any questions about these instructions.  Surgery Visitation Policy:  Patients undergoing  a surgery or procedure may have one family member or support person with them as long as that person is not COVID-19 positive or experiencing its symptoms.  That person may remain in the waiting area during the procedure and may rotate out with other people.

## 2021-04-15 NOTE — Progress Notes (Signed)
  Perioperative Services: Pre-Admission/Anesthesia Testing  Abnormal Lab Notification    Date: 04/15/21  Name: Natasha [name] MRN:   [id number]  Re: Abnormal labs noted during PAT appointment   Provider(s) Notified: Hessie Knows, MD Notification mode: Routed and/or faxed via [facility]   ABNORMAL LAB VALUE(S): Lab Results  Component Value Date   GLUCOSE 198 (H) 03/09/2021    Notes: Patient with a T2DM diagnosis. She is currently on both oral (JANUMET) and parenteral (TRESIBA) therapies. Last Hgb A1c was 9.4% on 02/24/2021. In efforts to reduce his risk of developing SSI, or other potential perioperative complications, this communication is being sent in order to determine if patient is deemed to have adequate medical optimization, including preoperative glycemic control.   The odds ratio for SSI/PJI infection is between 2.8 and 3.4 for orthopedic surgery patients with pre-operative serum glucose levels of > 125 mg/dL or a post-operative levels of > 200 mg/dL (Christine, 2019).    Data suggests that a Hgb A1c threshold of 7.7% tends to be more indicative of infection than the commonly used 7% and should perhaps be the pre-operative patient optimization goal Carolin Guernsey et al., 2017).   With that being said, the benefit of improving glycemic control must be weighed against the overall risk associated with delaying a necessary elective orthopedic surgery for this patient.   This is a Community education officer; no formal response is required.  Citations: Charlett Blake, A.F. Reducing the risk of infection after total joint arthroplasty: preoperative optimization. Arthroplasty 1, 4 (2019). [email or web address]  Lorrin Goodell MM, [place], Brigati D, [place] SM, [address] [place] JC, [place], [place], [place], Parvizi Lenna Sciara [place]. Determining the Threshold for HbA1c as a Predictor for Adverse Outcomes After Total Joint Arthroplasty: A Multicenter,  Retrospective Study. J Arthroplasty. 2017 Sep;32(9S):S263-S267.e1. [email or web address].   Honor Loh, MSN, APRN, FNP-C, CEN [facility]  Peri-operative Services Nurse Practitioner Phone: [phone] 04/15/21 2:08 PM

## 2021-04-15 NOTE — Telephone Encounter (Signed)
Patient with diagnosis of afib on Eliquis for anticoagulation.    Procedure: LEFT CARPAL TUNNEL RELEASE & ULNAR NERVE RELEASE AT ELBOW  Date of procedure: 04/25/21  CHA2DS2-VASc Score = 5   This indicates a 7.2% annual risk of stroke. The patient's score is based upon: CHF History: 0 HTN History: 1 Diabetes History: 1 Stroke History: 0 Vascular Disease History: 0 Age Score: 2 Gender Score: 1      CrCl 24.6 (ideal body weight)  Per office protocol, patient can hold Eliquis for 3 days prior to procedure.

## 2021-04-15 NOTE — Telephone Encounter (Signed)
Request for pre-operative cardiac clearance Received: Today [name], NP  P Cv Div Preop Callback Request for pre-operative cardiac clearance:     1. What type of surgery is being performed?  LEFT CARPAL TUNNEL RELEASE & ULNAR NERVE RELEASE AT ELBOW   2. When is this surgery scheduled?  04/25/2021   3. Are there any medications that need to be held prior to surgery?  Apixaban   4. Practice name and name of physician performing surgery?  Performing surgeon: Dr. Royston Cowper, MD  Requesting clearance: Honor Loh, FNP-C       5. Anesthesia type (none, local, MAC, general)? General   6. What is the office phone and fax number?    Phone: [phone]  Fax: [phone]   ATTENTION: Unable to create telephone message as per your standard workflow. Directed by [facility] providers to send requests for cardiac clearance to this pool for appropriate distribution to provider covering pre-operative clearances.   Honor Loh, MSN, APRN, FNP-C, CEN  [facility]  Peri-operative Services Nurse Practitioner  Phone: [phone]  04/15/21 2:06 PM

## 2021-04-15 NOTE — Telephone Encounter (Signed)
Pharmacy, can you please comment on how long Eliquis can be held for upcoming procedure?  Thank you!

## 2021-04-16 ENCOUNTER — Encounter: Payer: Self-pay | Admitting: Orthopedic Surgery

## 2021-04-16 NOTE — Telephone Encounter (Signed)
    Patient Name: Natasha [name]  DOB: [birth year] MRN: [id number]  Primary Cardiologist: Kate Sable, MD  Chart reviewed as part of pre-operative protocol coverage. Received clearance form for upcoming carpal tunnel and ulnar nerve release. Called to speak with patient. She states she is now following with Dr. Humphrey Rolls and would like him to address her perioperative risk from a cardiac standpoint. Patient stated she would call surgeon's office and let them know.  Will route this note to the requesting party via Epic fax function and remove from pre-op pool.  Please call with questions.  Darreld Mclean, PA-C 04/16/2021, 10:07 AM

## 2021-04-16 NOTE — Progress Notes (Signed)
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 Clinical nursing staff has updated patient's PMH/PSHx, current medication list, and drug allergies/intolerances to ensure comprehensive history available to assist in medical decision making as it pertains to the aforementioned surgical procedure and anticipated anesthetic course. Extensive review of available clinical information performed. [place] PMH and PSHx updated with any diagnoses/procedures that  may have been inadvertently omitted during her intake with the pre-admission testing department's nursing staff.  Clinical Discussion:  Natasha [name] is a 80 y.o. female who is submitted for pre-surgical anesthesia review and clearance prior to her undergoing the above procedure.Patient is a Former Smoker (24 pack years; quit 05/1975). Pertinent PMH includes: atrial fibrillation, mild aortic valve stenosis, cardiac murmur, diastolic dysfunction, aortic atherosclerosis, HTN, HLD, T2DM, CKD-III, asthma, OSAH (requires nocturnal PAP therapy), anemia, insomnia   Patient is followed by cardiology Humphrey Rolls, MD). Records from last office visit were requested for review (provider no on Epic), however not received prior to surgery. Patient advising that she was seen on 03/31/2021. Information gathered from patient and notes from her previous cardiologist. Past medical history  significant for cardiovascular diagnoses.  TTE performed on 12/21/2019 revealed a normal left ventricular systolic function with an EF of 60 to 65%.  Diastolic Doppler parameters consistent with impaired relaxation (G1DD).  PASP mildly elevated at 36.8 mmHg.  Left atrium mildly dilated.  There was mild to moderate mitral valve regurgitation.  There was no evidence of a significant transvalvular gradient to suggest significant stenosis.  Repeat TTE performed on 02/25/2021 remain grossly unchanged with preserved LV function; EF 60-65%.  Doppler parameters continue to be consistent with impaired relaxation (G1DD).  There was no evidence of valvular stenosis.  PMH significant for a atrial fibrillation; CHA2DS2-VASc Score = 6 (age x 2, sex, HTN, aortic plaque, T2DM).  Rate and rhythm maintained on daily oral amiodarone + metoprolol succinate.  She is chronically anticoagulated on full dose apixaban; compliant with therapy with no evidence or reports of GI bleeding. Blood pressure controlled on currently prescribed CCB and beta-blocker therapies.  Patient is on a statin for her HLD. T2DM uncontrolled on currently prescribed regimen; last Hgb was 9.4 when checked on 02/24/2021. Functional capacity, as defined by DASI, is reported to be >/= 4 METS.  No recent changes were made to patient's medication regimen by cardiology per her report. Patient to follow-up with outpatient cardiology in 3 months or sooner if needed.  Natasha [name] is scheduled for an elective LEFT CARPAL TUNNEL RELEASE AND ULNAR NERVE RELEASE AT THE ELBOW on 04/22/2021 with Dr. Hessie Knows, MD.  Given patient's past medical history significant for cardiovascular diagnoses, presurgical cardiac clearance was sought by the PAT team. [facility] cardiology office on several occassions with request for records and clearance, however there was no return communication to my faxes or phone calls. I contacted Dr. Humphrey Rolls directly through [facility]  secure chat on  04/21/2021, at which time verbal clearance was obtained. Per cardiology, "this patient is optimized for surgery and may proceed with the planned procedural course with an ACCEPTABLE risk of significant perioperative cardiovascular complications". Again, this patient is on daily anticoagulation therapy. Per surgeon, patient does not need to hold her daily apixaban dose for this procedure.  Patient denies previous perioperative complications with anesthesia in the past. In review of the available records, it is noted that patient underwent a general anesthetic course here (ASA III) in 09/2020 without documented complications.   Vitals with BMI 04/15/2021 04/14/2021 03/13/2021  Height $Remov'5\' 4"'mxIGWc$  - $'5\' 4"'d$   Weight 153 lbs 153 lbs 3 oz 156 lbs 2 oz  BMI 07.12 - 19.75  Systolic - 883 254  Diastolic - 75 78  Pulse - 54 78    Providers/Specialists:   NOTE: Primary physician provider listed below. Patient may have been seen by APP or partner within same practice.   PROVIDER ROLE / SPECIALTY LAST Fabio Bering, MD Orthopedics 04/11/2021  Lesleigh Noe, MD Primary Care Provider 03/13/2021  Neoma Laming, MD Cardiology 03/31/2021  Mee Hives, MD Endocrinology 02/03/2021   Allergies:  Atorvastatin, Lantus [insulin glargine], and Lisinopril  Current Home Medications:   No current facility-administered medications for this encounter.    amiodarone (PACERONE) 200 MG tablet   apixaban (ELIQUIS) 5 MG TABS tablet   Calcium Carb-Cholecalciferol (CALCIUM 600+D3 PO)   Dextromethorphan-guaiFENesin (MUCINEX DM PO)   Ferrous Gluconate (IRON 27 PO)   JANUMET 50-1000 MG tablet   metoprolol succinate (TOPROL-XL) 25 MG 24 hr tablet   pramipexole (MIRAPEX) 1 MG tablet   rosuvastatin (CRESTOR) 20 MG tablet   sertraline (ZOLOFT) 50 MG tablet   TRESIBA FLEXTOUCH 100 UNIT/ML SOPN FlexTouch Pen   glucose blood (CONTOUR NEXT TEST) test strip   History:   Past Medical History:  Diagnosis Date   A-fib  ([place])    a.) CHA2DS2-VASc Score = 6 (age x 2, sex, HTN, aortic plaque, T2DM). 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 b.) TTE 12/21/2019 and 02/25/2021: EF 60-65%; no RWMAs; mild LA dilitation; mild-mod MR; G1DD.   Diastolic dysfunction    a.) TTE 12/21/2019: ED 60-65%, LA mild dilated, mild-mod MR; PASP 36.8; G1DD. b.) TTE 02/25/2021: normal LV function with mild concentric LVH; G1DD.   Diverticulitis    High cholesterol    History of hiatal hernia    Hypertension    Insomnia    Long term current use of anticoagulant    a.) apixaban   Mild asthma    Murmur    Non-small cell carcinoma of left lung, stage 1 ([place]) 09/30/2020   a.) clinical stage 1 (cT1cN0cM0). b.) treated with SBRT (60 cGy over 5 fractions).   OSA on CPAP    Osteoporosis    Restless leg    Sepsis ([place])    T2DM (type 2 diabetes mellitus) ([place])    Past Surgical History:  Procedure Laterality Date   ABDOMINAL HYSTERECTOMY  1978   APPENDECTOMY  1978   BREAST BIOPSY Left ?   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   JOINT REPLACEMENT     KNEE SURGERY Left    tibial fracture with metal plate   TOTAL SHOULDER REPLACEMENT Left 2014   VIDEO BRONCHOSCOPY WITH ENDOBRONCHIAL NAVIGATION N/A 09/30/2020   Procedure: ROBOTIC ASSISTED VIDEO BRONCHOSCOPY  WITH ENDOBRONCHIAL NAVIGATION;  Surgeon: Tyler Pita, MD;  Location: [facility] ORS;  Service: Pulmonary;  Laterality: N/A;   WRIST SURGERY Left    fractures   Family History  Problem Relation Age of Onset   Other Mother        died from surgery   AAA (abdominal aortic aneurysm) Mother    Diabetes Father        controlled by diet   Dementia Father        brain atrophy - unknown origin   Breast cancer Cousin        maternal   Social History   Tobacco Use   Smoking status: Former    Packs/day: 1.00    Years: 12.00    Pack years: 12.00    Types: Cigarettes    Quit date: 05/26/1975    Years since quitting: 45.9   Smokeless tobacco: Never  Vaping Use   Vaping Use: Never used  Substance Use Topics   Alcohol use: No    Comment: rarely   Drug use: Never    Pertinent Clinical Results:  LABS: Labs reviewed: Acceptable for surgery.  Lab Results  Component Value Date   WBC 9.8 03/09/2021   HGB 11.0 (L) 03/09/2021   HCT 32.8 (L) 03/09/2021   MCV 85.6 03/09/2021   PLT 264 03/09/2021   Lab Results  Component Value Date   NA 137 03/09/2021   K 3.8 03/09/2021   CO2 19 (L) 03/09/2021   GLUCOSE 198 (H) 03/09/2021   BUN 23 03/09/2021   CREATININE 1.70 (H) 04/10/2021   CALCIUM 8.3 (L) 03/09/2021   GFRNONAA 33 (L) 03/09/2021   Lab Results  Component Value Date   HGBA1C 9.4 (H) 02/24/2021    ECG: Date: 03/09/2021 Time ECG obtained: 1656 PM Rate: 88 bpm Rhythm: normal sinus Axis (leads I and aVF): Normal Intervals: PR 160 ms. QRS 88 ms. QTc 459 ms. ST segment and T wave changes: Nonspecific ST and T wave abnormality  Comparison: Tracing performed on 02/24/2021 showed atrial flutter with variable AV block. Nonspecific ST and T wave abnormality present.   IMAGING / PROCEDURES: CT CHEST WITHOUT CONTRAST performed on 04/10/2021 Today's study demonstrates slight regression of the treated lesion in the left upper lobe, with evolving surrounding postradiation changes  predominantly in the left upper lobe. Prominent left hilar lymph nodes are similar to the prior examination and warrant close attention on follow-up studies, as the possibility of metastatic disease is not excluded. Aortic atherosclerosis. There are calcifications of the aortic valve and mitral annulus. Echocardiographic correlation for evaluation of potential valvular dysfunction may be warranted if clinically indicated. Small angiomyolipoma in the left kidney incidentally noted.  NM PET IMAGE INITIAL (PI) SKULL BASE TO THIGH performed on 23/76/2831 Hypermetabolic LEFT upper lobe nodule suspicious for bronchogenic neoplasm. No signs  of metastatic disease to the neck, chest, abdomen or pelvis. Mildly enlarged RIGHT axillary lymph node without metabolic activity above mediastinal blood pool. This may be reactive. Would also correlate with recent COVID vaccination if applicable. Consider attention on follow-up. Mild periumbilical fat stranding is of uncertain significance. Correlate with any signs of cellulitis/panniculitis. Post hysterectomy and cholecystectomy as well as gastric bypass  TRANSTHORACIC ECHOCARDIOGRAM performed on 12/21/2019 Left ventricular ejection fraction, by estimation, is 60 to 65%.  The left ventricle has normal function.  The left ventricle has no regional wall motion abnormalities.  Left ventricular diastolic parameters are consistent with Grade I diastolic dysfunction (impaired relaxation).  Right ventricular systolic function is normal.  The right ventricular size is normal.  There is mildly elevated pulmonary artery systolic pressure. The estimated right ventricular systolic pressure is 32.1 mmHg.  Left atrial size was mildly dilated.  Mild to moderate mitral valve regurgitation.  Mild to moderate aortic valve sclerosis/calcification is present, without any evidence of aortic stenosis.   Impression and Plan:  Natasha [name] has been referred for pre-anesthesia review  and clearance prior to her undergoing the planned anesthetic and procedural courses. Available labs, pertinent testing, and imaging results were personally reviewed by me. This patient has been appropriately cleared by cardiology with an overall ACCEPTBLE risk of significant perioperative cardiovascular complications.  Based on clinical review performed today (04/21/21), barring any significant acute changes in the patient's overall condition, it is anticipated that she will be able to proceed with the planned surgical intervention. Any acute changes in clinical condition may necessitate her procedure being postponed and/or cancelled. Patient will meet with anesthesia team (MD and/or CRNA) on the day of her procedure for preoperative evaluation/assessment. Questions regarding anesthetic course will be fielded at that time.   Pre-surgical instructions were reviewed with the patient during her PAT appointment and questions were fielded by PAT clinical staff. Patient was advised that if any questions or concerns arise prior to her procedure then she should return a call to PAT and/or her surgeon's office to discuss.  Honor Loh, MSN, APRN, FNP-C, CEN [facility]  Peri-operative Services Nurse Practitioner Phone: [phone] Fax: [phone] 04/21/21 4:38 PM  NOTE: This note has been prepared using Dragon dictation software. Despite my best ability to proofread, there is always the potential that unintentional transcriptional errors may still occur from this process.

## 2021-04-19 ENCOUNTER — Encounter: Payer: Self-pay | Admitting: Orthopedic Surgery

## 2021-04-22 ENCOUNTER — Ambulatory Visit: Payer: Medicare Other | Admitting: Urgent Care

## 2021-04-22 ENCOUNTER — Other Ambulatory Visit: Payer: Self-pay

## 2021-04-22 ENCOUNTER — Encounter: Payer: Self-pay | Admitting: Orthopedic Surgery

## 2021-04-22 ENCOUNTER — Ambulatory Visit
Admission: RE | Admit: 2021-04-22 | Discharge: 2021-04-22 | Disposition: A | Discharge status: Home / Self Care | Payer: Medicare Other | Attending: Orthopedic Surgery | Admitting: Orthopedic Surgery

## 2021-04-22 ENCOUNTER — Encounter
Admission: RE | Disposition: A | Discharge status: Home / Self Care | Payer: Self-pay | Source: Home / Self Care | Attending: Orthopedic Surgery

## 2021-04-22 DIAGNOSIS — G5622 Lesion of ulnar nerve, left upper limb: Secondary | ICD-10-CM | POA: Diagnosis not present

## 2021-04-22 DIAGNOSIS — N183 Chronic kidney disease, stage 3 unspecified: Secondary | ICD-10-CM | POA: Diagnosis not present

## 2021-04-22 DIAGNOSIS — G5603 Carpal tunnel syndrome, bilateral upper limbs: Secondary | ICD-10-CM | POA: Diagnosis present

## 2021-04-22 DIAGNOSIS — Z87891 Personal history of nicotine dependence: Secondary | ICD-10-CM | POA: Insufficient documentation

## 2021-04-22 DIAGNOSIS — E1122 Type 2 diabetes mellitus with diabetic chronic kidney disease: Secondary | ICD-10-CM | POA: Insufficient documentation

## 2021-04-22 DIAGNOSIS — Z7901 Long term (current) use of anticoagulants: Secondary | ICD-10-CM | POA: Insufficient documentation

## 2021-04-22 DIAGNOSIS — I340 Nonrheumatic mitral (valve) insufficiency: Secondary | ICD-10-CM | POA: Insufficient documentation

## 2021-04-22 DIAGNOSIS — I129 Hypertensive chronic kidney disease with stage 1 through stage 4 chronic kidney disease, or unspecified chronic kidney disease: Secondary | ICD-10-CM | POA: Diagnosis not present

## 2021-04-22 DIAGNOSIS — I4891 Unspecified atrial fibrillation: Secondary | ICD-10-CM | POA: Insufficient documentation

## 2021-04-22 HISTORY — DX: Other ill-defined heart diseases: I51.89

## 2021-04-22 HISTORY — DX: Long term (current) use of anticoagulants: Z79.01

## 2021-04-22 HISTORY — DX: Unspecified asthma, uncomplicated: J45.909

## 2021-04-22 HISTORY — PX: ULNAR TUNNEL RELEASE: SHX820

## 2021-04-22 HISTORY — DX: Obstructive sleep apnea (adult) (pediatric): G47.33

## 2021-04-22 HISTORY — PX: CARPAL TUNNEL RELEASE: SHX101

## 2021-04-22 LAB — GLUCOSE, CAPILLARY
Glucose-Capillary: 154 mg/dL — ABNORMAL HIGH (ref 70–99)
Glucose-Capillary: 149 mg/dL — ABNORMAL HIGH (ref 70–99)

## 2021-04-22 SURGERY — CARPAL TUNNEL RELEASE
Anesthesia: General | Laterality: Left

## 2021-04-22 MED ORDER — HYDROMORPHONE HCL 1 MG/ML IJ SOLN
0.5000 mg | INTRAMUSCULAR | Status: DC | PRN
  Administered 2021-04-22: 0.5 mg via INTRAVENOUS

## 2021-04-22 MED ORDER — ORAL CARE MOUTH RINSE: 15.0000 mL | Freq: Once | OROMUCOSAL | Status: AC

## 2021-04-22 MED ORDER — PROPOFOL 10 MG/ML IV BOLUS
INTRAVENOUS | Status: AC
  Filled 2021-04-22: qty 20

## 2021-04-22 MED ORDER — DEXAMETHASONE SODIUM PHOSPHATE 10 MG/ML IJ SOLN
INTRAMUSCULAR | Status: DC | PRN
  Administered 2021-04-22: 4 mg via INTRAVENOUS

## 2021-04-22 MED ORDER — LACTATED RINGERS IV SOLN: INTRAVENOUS | Status: DC

## 2021-04-22 MED ORDER — HYDROMORPHONE HCL 1 MG/ML IJ SOLN
INTRAMUSCULAR | Status: AC
  Filled 2021-04-22: qty 1

## 2021-04-22 MED ORDER — BUPIVACAINE HCL 0.5 % IJ SOLN
INTRAMUSCULAR | Status: DC | PRN
  Administered 2021-04-22: 10 mL

## 2021-04-22 MED ORDER — ALBUTEROL SULFATE HFA 108 (90 BASE) MCG/ACT IN AERS
INHALATION_SPRAY | RESPIRATORY_TRACT | Status: DC | PRN
  Administered 2021-04-22: 4 via RESPIRATORY_TRACT

## 2021-04-22 MED ORDER — ONDANSETRON HCL 4 MG/2ML IJ SOLN
INTRAMUSCULAR | Status: DC | PRN
  Administered 2021-04-22: 4 mg via INTRAVENOUS

## 2021-04-22 MED ORDER — ACETAMINOPHEN 10 MG/ML IV SOLN
INTRAVENOUS | Status: AC
  Filled 2021-04-22: qty 100

## 2021-04-22 MED ORDER — FENTANYL CITRATE (PF) 100 MCG/2ML IJ SOLN
INTRAMUSCULAR | Status: AC
  Filled 2021-04-22: qty 2

## 2021-04-22 MED ORDER — ACETAMINOPHEN 10 MG/ML IV SOLN: 1000.0000 mg | Freq: Once | INTRAVENOUS | Status: DC | PRN

## 2021-04-22 MED ORDER — EPHEDRINE SULFATE 50 MG/ML IJ SOLN
INTRAMUSCULAR | Status: DC | PRN
  Administered 2021-04-22: 5 mg via INTRAVENOUS

## 2021-04-22 MED ORDER — SODIUM CHLORIDE 0.9 % IV SOLN: INTRAVENOUS | Status: DC

## 2021-04-22 MED ORDER — ONDANSETRON HCL 4 MG/2ML IJ SOLN
INTRAMUSCULAR | Status: AC
  Filled 2021-04-22: qty 2

## 2021-04-22 MED ORDER — DEXAMETHASONE SODIUM PHOSPHATE 10 MG/ML IJ SOLN
INTRAMUSCULAR | Status: AC
  Filled 2021-04-22: qty 1

## 2021-04-22 MED ORDER — LIDOCAINE HCL (PF) 2 % IJ SOLN
INTRAMUSCULAR | Status: AC
  Filled 2021-04-22: qty 5

## 2021-04-22 MED ORDER — OXYCODONE HCL 5 MG PO TABS
5.0000 mg | ORAL_TABLET | Freq: Once | ORAL | Status: AC | PRN
  Administered 2021-04-22: 5 mg via ORAL
  Filled 2021-04-22: qty 1

## 2021-04-22 MED ORDER — FENTANYL CITRATE (PF) 100 MCG/2ML IJ SOLN
25.0000 ug | INTRAMUSCULAR | Status: DC | PRN
  Administered 2021-04-22: 25 ug via INTRAVENOUS
  Administered 2021-04-22: 50 ug via INTRAVENOUS
  Administered 2021-04-22: 25 ug via INTRAVENOUS
  Filled 2021-04-22: qty 2

## 2021-04-22 MED ORDER — BUPIVACAINE HCL (PF) 0.5 % IJ SOLN
INTRAMUSCULAR | Status: AC
  Filled 2021-04-22: qty 30

## 2021-04-22 MED ORDER — ONDANSETRON HCL 4 MG/2ML IJ SOLN: 4.0000 mg | Freq: Once | INTRAMUSCULAR | Status: DC | PRN

## 2021-04-22 MED ORDER — ACETAMINOPHEN 10 MG/ML IV SOLN
INTRAVENOUS | Status: DC | PRN
  Administered 2021-04-22: 1000 mg via INTRAVENOUS

## 2021-04-22 MED ORDER — PHENYLEPHRINE HCL-NACL 20-0.9 MG/250ML-% IV SOLN
INTRAVENOUS | Status: AC
  Filled 2021-04-22: qty 250

## 2021-04-22 MED ORDER — FAMOTIDINE 20 MG PO TABS
20.0000 mg | ORAL_TABLET | Freq: Once | ORAL | Status: AC
  Administered 2021-04-22: 20 mg via ORAL

## 2021-04-22 MED ORDER — ALBUTEROL SULFATE HFA 108 (90 BASE) MCG/ACT IN AERS
INHALATION_SPRAY | RESPIRATORY_TRACT | Status: AC
  Filled 2021-04-22: qty 6.7

## 2021-04-22 MED ORDER — SODIUM CHLORIDE 0.9 % IR SOLN
Status: DC | PRN
  Administered 2021-04-22: 50 mL

## 2021-04-22 MED ORDER — OXYCODONE HCL 5 MG/5ML PO SOLN: 5.0000 mg | Freq: Once | ORAL | Status: AC | PRN

## 2021-04-22 MED ORDER — HYDROCODONE-ACETAMINOPHEN 5-325 MG PO TABS: 1.0000 | ORAL_TABLET | Freq: Four times a day (QID) | ORAL | 0 refills | Status: DC | PRN

## 2021-04-22 MED ORDER — CEFAZOLIN SODIUM-DEXTROSE 1-4 GM/50ML-% IV SOLN
1.0000 g | INTRAVENOUS | Status: AC
  Administered 2021-04-22: 2 g via INTRAVENOUS

## 2021-04-22 MED ORDER — LIDOCAINE HCL (CARDIAC) PF 100 MG/5ML IV SOSY
PREFILLED_SYRINGE | INTRAVENOUS | Status: DC | PRN
  Administered 2021-04-22: 80 mg via INTRAVENOUS

## 2021-04-22 MED ORDER — PROPOFOL 10 MG/ML IV BOLUS
INTRAVENOUS | Status: DC | PRN
  Administered 2021-04-22: 100 mg via INTRAVENOUS
  Administered 2021-04-22: 30 mg via INTRAVENOUS

## 2021-04-22 MED ORDER — CHLORHEXIDINE GLUCONATE 0.12 % MT SOLN
15.0000 mL | Freq: Once | OROMUCOSAL | Status: AC
  Administered 2021-04-22: 15 mL via OROMUCOSAL

## 2021-04-22 MED ORDER — OXYCODONE HCL 5 MG PO TABS
ORAL_TABLET | ORAL | Status: AC
  Filled 2021-04-22: qty 1

## 2021-04-22 MED ORDER — FENTANYL CITRATE (PF) 100 MCG/2ML IJ SOLN
INTRAMUSCULAR | Status: DC | PRN
  Administered 2021-04-22 (×4): 25 ug via INTRAVENOUS

## 2021-04-22 SURGICAL SUPPLY — 25 items
APL PRP STRL LF DISP 70% ISPRP (MISCELLANEOUS) ×1
BNDG ELASTIC 3X5.8 VLCR STR LF (GAUZE/BANDAGES/DRESSINGS) ×2 IMPLANT
CHLORAPREP W/TINT 26 (MISCELLANEOUS) ×2 IMPLANT
CUFF TOURN SGL QUICK 18X4 (TOURNIQUET CUFF) ×2 IMPLANT
ELECT CAUTERY NEEDLE 2.0 MIC (NEEDLE) ×2 IMPLANT
GAUZE 4X4 16PLY ~~LOC~~+RFID DBL (SPONGE) ×2 IMPLANT
GAUZE SPONGE 4X4 12PLY STRL (GAUZE/BANDAGES/DRESSINGS) ×2 IMPLANT
GAUZE XEROFORM 1X8 LF (GAUZE/BANDAGES/DRESSINGS) ×2 IMPLANT
GLOVE SURG SYN 9.0  PF PI (GLOVE) ×1
GLOVE SURG SYN 9.0 PF PI (GLOVE) ×1 IMPLANT
GOWN SRG 2XL LVL 4 RGLN SLV (GOWNS) ×1 IMPLANT
GOWN STRL NON-REIN 2XL LVL4 (GOWNS) ×2
GOWN STRL REUS W/ TWL LRG LVL3 (GOWN DISPOSABLE) ×1 IMPLANT
GOWN STRL REUS W/TWL LRG LVL3 (GOWN DISPOSABLE) ×2
KIT TURNOVER KIT A (KITS) ×2 IMPLANT
MANIFOLD NEPTUNE II (INSTRUMENTS) ×2 IMPLANT
NS IRRIG 500ML POUR BTL (IV SOLUTION) ×2 IMPLANT
PACK EXTREMITY ARMC (MISCELLANEOUS) ×2 IMPLANT
PAD CAST CTTN 4X4 STRL (SOFTGOODS) ×2 IMPLANT
PADDING CAST COTTON 4X4 STRL (SOFTGOODS) ×4
SCALPEL PROTECTED #15 DISP (BLADE) ×4 IMPLANT
SUT ETHILON 4-0 (SUTURE) ×4
SUT ETHILON 4-0 FS2 18XMFL BLK (SUTURE) ×2
SUTURE ETHLN 4-0 FS2 18XMF BLK (SUTURE) ×2 IMPLANT
WATER STERILE IRR 500ML POUR (IV SOLUTION) ×2 IMPLANT

## 2021-04-22 NOTE — Op Note (Signed)
04/22/2021  8:24 AM  PATIENT:  Natasha [name]  80 y.o. 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   PROCEDURE:  Procedure(s): Left carpal tunnel release & ulnar nerve release at elbow (Left) CUBITAL TUNNEL RELEASE (Left)  SURGEON: Laurene Footman, MD  ASSISTANTS: none  ANESTHESIA:   general  EBL:  Total I/O In: 100 [IV Piggyback:100] Out: -   BLOOD ADMINISTERED:none  DRAINS: none   LOCAL MEDICATIONS USED:  MARCAINE     SPECIMEN:  No Specimen  DISPOSITION OF SPECIMEN:  N/A  COUNTS:  YES  TOURNIQUET:   Total Tourniquet Time Documented: Upper Arm (Left) - 30 minutes Total: Upper Arm (Left) - 30 minutes   IMPLANTS: none  DICTATION: .Dragon Dictation patient was brought to the operating room and after adequate anesthesia was obtained left arm was prepped and draped in usual sterile fashion.  After patient identification timeout procedures tourniquet was raised.  Carpal tunnel was addressed first with incision in line with the ring metacarpal approximately 2 and half centimeters in length.  Subcutaneous tissue spread and the transverse carpal ligament identified and incised.  A vascular hemostat was placed deep to protect underlying structures and meniscus carried out proximally about a centimeter and half proximal to wrist crease is at their level of the wrist crease there appeared to be compression and there is good vascular blush following this.  Releasing more distally there was no compression past the midportion of the carpal tunnel but after complete release the wound was irrigated and infiltrated 10 cc half percent Sensorcaine and closed with simple erupted 4-0 nylon.  Going to the elbow a posterior incision was made posterior to the medial epicondyle and subcutaneous nerves were preserved as  much as possible.  The cubital tunnel was identified and opened in the midportion release carried out distally it appeared that the compression was at the level of the flexor carpi ulnaris heads going proximally there did not appear to be decompression proximal to the cubital tunnel after complete release and placing the elbow through range of motion and seeing the nerve was stable the wound was thoroughly irrigated closed with simple erupted 4-0 nylon.  Dressings of Xeroform 4 x 4 web roll and Ace wrap applied and tourniquet let down with close the case.  PLAN OF CARE: Discharge to home after PACU  PATIENT DISPOSITION:  PACU - hemodynamically stable.

## 2021-04-22 NOTE — H&P (Signed)
Chief Complaint  Patient presents with   Left Wrist - Numbness, Pain   Right Wrist - Numbness, Pain    History of the Present Illness: Natasha [name] is a 80 y.o. female here today.   The patient presents for evaluation of bilateral carpal tunnel syndrome. She was seen in neurology at [facility] on 01/29/2021, and had an EMG nerve conduction test. The EMG nerve conduction test showed bilateral median nerve enlargement at the distal wrist crease, as well as focal ulnar nerve entrapment at the left elbow. Radial nerve responses were normal, consistent with carpal tunnel rather than significant neuropathy. 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 She comes in now to discuss possible surgical intervention.  The patient states her biggest complaint is loss of strength. She states this has been going on for several months. She locates her numbness to the lateral aspect of her left hand. She states her right hand goes to sleep at night, and she occasionally has to shake it out. The patient states she has tried a wrist brace, but it did not help.  The patient states she has lung cancer, and she had a CT scan yesterday ordered by Noreene Filbert, MD. She states she has some back pain, and she has a herniating disc. 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 The stenosis is causing pain down both legs. She was seeing Edger House, MD, who gave her an injection, and she does not respond well to that. She has done physical therapy. She has an appointment with a provider who she cannot remember the name of, for surgery on her back in 04/2021. She has a history of atrial fibrillation, and is on Eliquis for that. Her cardiologist is Dr. Chancy Milroy. She notes she has fractured her left elbow, wrist and knee in the past.   The patient is retired. She is right-hand dominant.   I have reviewed past medical, surgical, social and family history, and allergies as documented  in the EMR.  Past Medical History: Past Medical History:  Diagnosis Date   Depression   Lung cancer (CMS-HCC)   Obstructive sleep apnea treated with continuous positive airway pressure (CPAP)   Osteoporosis   Paroxysmal atrial fibrillation (CMS-HCC)   Restless leg syndrome   Type 2 diabetes mellitus (CMS-HCC)   Past Surgical History: Past Surgical History:  Procedure Laterality Date   HYSTERECTOMY VAGINAL 1977   knee surgery   LAPAROSCOPIC CHOLECYSTECTOMY   ORIF wrist fracture   Roux en Y Gastric Bypass 2019   TOTAL SHOULDER REPLACEMENT 2015   Past Family History: History reviewed. No pertinent family history.  Medications: Current Outpatient Medications Ordered in Epic  Medication Sig Dispense Refill   acetaminophen (TYLENOL) 500 MG tablet Take by mouth   AMIOdarone (PACERONE) 200 MG tablet Take 2 tablets (400 mg total) by mouth once daily   apixaban (ELIQUIS) 5 mg tablet Take by mouth every 12 (twelve) hours   blood glucose diagnostic (GLUCOSE BLOOD) test strip Dx E 11.49   calcium carbonate 500 mg calcium (1,250 mg) tablet Take by mouth once daily   ferrous sulfate 325 (65 FE) MG tablet Take 325 mg by mouth daily with breakfast   insulin DEGLUDEC (TRESIBA FLEXTOUCH U-100) pen injector (concentration 100 units/mL) Inject 10 units nightly. 3 mL 3   multivitamin capsule Take by mouth once daily   pramipexole (MIRAPEX) 1 MG tablet Take by mouth once daily   rosuvastatin (CRESTOR) 20  MG tablet Take by mouth once daily   sertraline (ZOLOFT) 50 MG tablet Take by mouth once daily   sitaGLIPtin-metFORMIN (JANUMET) 50-1,000 mg tablet Take 1 tablet by mouth 2 (two) times daily with meals 180 tablet 3   vitamin E 200 UNIT capsule Take by mouth once daily   No current Epic-ordered facility-administered medications on file.   Allergies: Allergies  Allergen Reactions   Lantus Solostar U-100 Insulin [Insulin Glargine] Hives   Lipitor [Atorvastatin] Other (See Comments)  Joint  pain   Lisinopril Cough    Body mass index is 26.47 kg/m.  Review of Systems: A comprehensive 14 point ROS was performed, reviewed, and the pertinent orthopaedic findings are documented in the HPI.  Vitals:  04/11/21 0818  BP: 118/72    General Physical Examination:   General/Constitutional: No apparent distress: well-nourished and well developed. Eyes: Pupils equal, round with synchronous movement. Lungs: Clear to auscultation HEENT: Normal Vascular: No edema, swelling or tenderness, except as noted in detailed exam. Cardiac: Heart rate and rhythm is regular. Integumentary: No impressive skin lesions present, except as noted in detailed exam. Neuro/Psych: Normal mood and affect, oriented to person, place and time.  On exam, right arm muscle atrophy. Positive Tinel sign approximately 2 cm proximal to the wrist flexor on the right. Negative Phalen test on the right. Left hand hypothenar atrophy. Negative Tinel sign on the left. Positive Phalen test on the left. Weakness to the left hand. Diminished sensations to the left small finger to light touch.  Radiographs:  No new imaging studies were obtained today.  Assessment: ICD-10-CM  1. Carpal tunnel syndrome of left wrist G56.02  2. Carpal tunnel syndrome of right wrist G56.01  3. 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  We discussed the patient's prior EMG nerve conduction study findings. We will plan for carpal tunnel release and cubital tunnel release under general anesthesia. I explained the surgery and postoperative course in detail. She does not need to stop the Eliquis preoperatively.  Surgical Risks:  The nature of the condition and the proposed procedure has been reviewed in detail with the patient. Surgical versus non-surgical options and  prognosis for recovery have been reviewed and the inherent risks and benefits of each have been discussed including the risks of infection, bleeding, injury to nerves/blood vessels/tendons, incomplete relief of symptoms, persisting pain and/or stiffness, loss of function, complex regional pain syndrome, failure of the procedure, as appropriate.  Teeth: Crowns.  Scribe Attestation: I, Dawn Royse, am acting as Education administrator for [employer], MD.  Answers for HPI/ROS submitted by the patient on 04/09/2021 How did your symptoms begin?: gradually without injury How long ago did your symptoms begin?: 8 Months Please indicate all symptoms related to your issue: numbness, weakness Where is your pain located? (select all that apply): left hand, left small finger Since the onset of your problem, has the pain improved, worsened, or stayed the same?: getting worse Do your symptoms wake you from sleep?: No   Electronically signed by Lauris Poag, MD at 04/11/2021 3:25 PM EST   Reviewed  H+P. No changes noted.

## 2021-04-22 NOTE — Anesthesia Procedure Notes (Signed)
Procedure Name: LMA Insertion Date/Time: 04/22/2021 7:29 AM Performed by: Lia Foyer, CRNA Pre-anesthesia Checklist: Patient identified, Emergency Drugs available, Suction available and Patient being monitored Patient Re-evaluated:Patient Re-evaluated prior to induction Oxygen Delivery Method: Circle system utilized Preoxygenation: Pre-oxygenation with 100% oxygen Induction Type: IV induction Ventilation: Mask ventilation without difficulty LMA: LMA flexible inserted LMA Size: 3.5 Tube type: Oral Number of attempts: 1 Airway Equipment and Method: Oral airway Placement Confirmation: positive ETCO2 and breath sounds checked- equal and bilateral Tube secured with: Tape Dental Injury: Teeth and Oropharynx as per pre-operative assessment

## 2021-04-22 NOTE — Anesthesia Postprocedure Evaluation (Signed)
Anesthesia Post Note  Patient: Natasha [name]  Procedure(s) Performed: Left carpal tunnel release & ulnar nerve release at elbow (Left) CUBITAL TUNNEL RELEASE (Left)  Patient location during evaluation: PACU Anesthesia Type: General Level of consciousness: awake and alert, oriented and patient cooperative Pain management: pain level controlled Vital Signs Assessment: post-procedure vital signs reviewed and stable Respiratory status: spontaneous breathing, nonlabored ventilation and respiratory function stable Cardiovascular status: blood pressure returned to baseline and stable Postop Assessment: adequate PO intake Anesthetic complications: no   No notable events documented.   Last Vitals:  Vitals:   04/22/21 0915 04/22/21 0929  BP: 135/66 132/81  Pulse: (!) 53 (!) 52  Resp: 19 18  Temp: 36.6 C (!) 36.2 C  SpO2: 95% 95%    Last Pain:  Vitals:   04/22/21 0929  TempSrc: Temporal  PainSc: [place]

## 2021-04-22 NOTE — Discharge Instructions (Signed)
Keep dressing clean and dry Loosen Ace wrap if fingers swell Leave cotton roll on underneath Pain medicine as directed

## 2021-04-22 NOTE — Anesthesia Preprocedure Evaluation (Signed)
Anesthesia Evaluation  Patient identified by MRN, date of birth, ID band Patient awake    Reviewed: Allergy & Precautions, NPO status , Patient's Chart, lab work & pertinent test results  History of Anesthesia Complications Negative for: history of anesthetic complications  Airway Mallampati: III   Neck ROM: Full    Dental   Crowns :   Pulmonary asthma , sleep apnea and Continuous Positive Airway Pressure Ventilation , former smoker (quit 1977),  NSCLC; chronic cough   + rhonchi  + decreased breath sounds      Cardiovascular hypertension, Normal cardiovascular exam+ dysrhythmias (a fib on Eliquis)  Rhythm:Regular Rate:Normal  TTE 12/21/19: normal left ventricular systolic function with an EF of 60 to 65%.  Diastolic Doppler parameters consistent with impaired relaxation (G1DD).  PASP mildly elevated at 36.8 mmHg.  Left atrium mildly dilated.  There was mild to moderate mitral valve regurgitation.  There was no evidence of a significant transvalvular gradient to suggest significant stenosis.  TTE 02/25/21: grossly unchanged with preserved LV function; EF 60-65%.  Doppler parameters continue to be consistent with impaired relaxation (G1DD).  There was no evidence of valvular stenosis.   Neuro/Psych negative neurological ROS     GI/Hepatic hiatal hernia, GERD  ,S/p gastric bypass   Endo/Other  diabetes, Type 2  Renal/GU Renal disease (stage III CKD)     Musculoskeletal  (+) Arthritis ,   Abdominal   Peds  Hematology negative hematology ROS (+)   Anesthesia Other Findings Reviewed and agree with Bayard Males pre-anesthesia clinical review note.  Reproductive/Obstetrics                             Anesthesia Physical Anesthesia Plan  ASA: 3  Anesthesia Plan: General   Post-op Pain Management:    Induction: Intravenous  PONV Risk Score and Plan: 3 and Ondansetron, Dexamethasone and  Treatment may vary due to age or medical condition  Airway Management Planned: LMA  Additional Equipment:   Intra-op Plan:   Post-operative Plan: Extubation in OR  Informed Consent: I have reviewed the patients History and Physical, chart, labs and discussed the procedure including the risks, benefits and alternatives for the proposed anesthesia with the patient or authorized representative who has indicated his/her understanding and acceptance.     Dental advisory given  Plan Discussed with: CRNA  Anesthesia Plan Comments: (Patient consented for risks of anesthesia including but not limited to:  - adverse reactions to medications - damage to eyes, teeth, lips or other oral mucosa - nerve damage due to positioning  - sore throat or hoarseness - damage to heart, brain, nerves, lungs, other parts of body or loss of life  Informed patient about role of CRNA in peri- and intra-operative care.  Patient voiced understanding.)        Anesthesia Quick Evaluation

## 2021-04-22 NOTE — Transfer of Care (Signed)
Immediate Anesthesia Transfer of Care Note  Patient: Natasha [name]  Procedure(s) Performed: Left carpal tunnel release & ulnar nerve release at elbow (Left) CUBITAL TUNNEL RELEASE (Left)  Patient Location: PACU  Anesthesia Type:General  Level of Consciousness: drowsy and patient cooperative  Airway & Oxygen Therapy: Patient Spontanous Breathing and Patient connected to face mask oxygen  Post-op Assessment: Report given to RN and Post -op Vital signs reviewed and stable  Post vital signs: Reviewed and stable  Last Vitals:  Vitals Value Taken Time  BP 134/57 04/22/21 0825  Temp 36.4 C 04/22/21 0825  Pulse 52 04/22/21 0828  Resp 16 04/22/21 0828  SpO2 100 % 04/22/21 0828  Vitals shown include unvalidated device data.  Last Pain:  Vitals:   04/22/21 0633  TempSrc: Temporal  PainSc: 0-No pain         Complications: No notable events documented.

## 2021-05-03 ENCOUNTER — Other Ambulatory Visit: Payer: Self-pay

## 2021-05-03 ENCOUNTER — Ambulatory Visit
Admission: EM | Admit: 2021-05-03 | Discharge: 2021-05-03 | Disposition: A | Discharge status: Home / Self Care | Payer: Medicare Other

## 2021-05-03 ENCOUNTER — Encounter: Payer: Self-pay | Admitting: Emergency Medicine

## 2021-05-03 DIAGNOSIS — H60392 Other infective otitis externa, left ear: Secondary | ICD-10-CM

## 2021-05-03 MED ORDER — NEOMYCIN-POLYMYXIN-HC 3.5-10000-1 OT SUSP: 3.0000 [drp] | Freq: Three times a day (TID) | OTIC | 0 refills | Status: AC

## 2021-05-03 NOTE — ED Provider Notes (Signed)
Natasha [name]    CSN: [id number] Arrival date & time: 05/03/21  1331      History   Chief Complaint Chief Complaint  Patient presents with   Otalgia    HPI Natasha [name] is a 80 y.o. female.   HPI Patient presents today for evaluation of left ear pain which is been ongoing for 1 week.  She reported that the left ear was initially uncomfortable however over the last day it has become more painful and when she coughs she characterizes the pain as being sharp.  Past Medical History:  Diagnosis Date   A-fib [facility])    a.) CHA2DS2-VASc Score = 6 (age x 2, sex, HTN, aortic plaque, T2DM). 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 b.) TTE 12/21/2019 and 02/25/2021: EF 60-65%; no RWMAs; mild LA dilitation; mild-mod MR; G1DD.   Diastolic dysfunction    a.) TTE 12/21/2019: ED 60-65%, LA mild dilated, mild-mod MR; PASP 36.8; G1DD. b.) TTE 02/25/2021: normal LV function with mild concentric LVH; G1DD.   Diverticulitis    High cholesterol    History of hiatal hernia    Hypertension    Insomnia    Long term current use of anticoagulant    a.) apixaban   Mild asthma    Murmur    Non-small cell carcinoma of left lung, stage 1 ([place]) 09/30/2020   a.) clinical stage 1 (cT1cN0cM0). b.) treated with SBRT (60 cGy over 5 fractions).   OSA on CPAP    Osteoporosis    Restless leg    Sepsis ([place])    T2DM (type 2 diabetes mellitus) [facility])     Patient Active Problem List   Diagnosis Date Noted   Double vision 03/13/2021   Dizziness 03/13/2021   COVID-19 virus infection 02/24/2021   Atrial flutter ([place])  02/24/2021   Chronic bilateral low back pain with bilateral sciatica 02/17/2021   Decreased sensation 11/27/2020   Dermatitis 11/27/2020   Axillary lymphadenopathy 10/11/2020   Cancer of upper lobe of left lung ([place]) 10/11/2020   Goals of care, counseling/discussion 10/11/2020   Status post reverse total shoulder replacement, 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   Diverticulitis of colon 02/03/2020   Chronic pain syndrome 01/12/2019   Neuropathic pain 01/12/2019   At high risk for falls 11/02/2018   Neuroforaminal stenosis of lumbar spine 11/02/2018   Sacroiliitis ([place]) 11/02/2018   Former smoker 09/23/2018   Spinal stenosis of lumbar region without neurogenic claudication 09/23/2018   Weakness of both hands 09/23/2018   Overweight (BMI 25.0-29.9) 08/10/2018   Insomnia 06/17/2018   Chronic kidney disease with symptom management only, stage 3 (moderate) ([place]) 05/27/2018   Nonrheumatic aortic valve stenosis 05/24/2018   Normocytic anemia 12/24/2017   Paroxysmal atrial fibrillation ([place]) 12/24/2017   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/24/2017   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/2017   Gastroesophageal reflux disease 11/17/2016   OSA (obstructive sleep apnea) 07/24/2016   Diverticulosis 07/21/2016   Osteopenia of multiple sites 06/04/2016   Closed compression fracture of thoracic vertebra ([place]) 04/02/2015   Mixed hyperlipidemia 04/02/2015   Restless leg syndrome 04/02/2015   Closed fracture of  lateral portion of left tibial plateau 04/06/2014   Essential hypertension 04/06/2014   Mild intermittent asthma without complication 54/27/0623   Type 2 diabetes mellitus with other specified complication ([place]) 76/28/3151    Past Surgical History:  Procedure Laterality Date   ABDOMINAL HYSTERECTOMY  1978   APPENDECTOMY  1978   BREAST BIOPSY Left ?   papilloma   BREAST CYST EXCISION Bilateral yrs ago   benign, scars not well visualized   BREAST SURGERY     CARPAL TUNNEL RELEASE Left 04/22/2021    Procedure: Left carpal tunnel release & ulnar nerve release at elbow;  Surgeon: Hessie Knows, MD;  Location: [facility] ORS;  Service: Orthopedics;  Laterality: Left;   CATARACT EXTRACTION W/ INTRAOCULAR LENS  IMPLANT, BILATERAL Bilateral    CHOLECYSTECTOMY     COLONOSCOPY     ELBOW SURGERY Right    Bosworth release   EYE SURGERY     FRACTURE SURGERY     GASTRIC BYPASS  12/22/2017   Roux-N-Y   JOINT REPLACEMENT     KNEE SURGERY Left    tibial fracture with metal plate   TOTAL SHOULDER REPLACEMENT Left 2014   ULNAR TUNNEL RELEASE Left 04/22/2021   Procedure: CUBITAL TUNNEL RELEASE;  Surgeon: Hessie Knows, MD;  Location: [facility] ORS;  Service: Orthopedics;  Laterality: Left;   VIDEO BRONCHOSCOPY WITH ENDOBRONCHIAL NAVIGATION N/A 09/30/2020   Procedure: ROBOTIC ASSISTED VIDEO BRONCHOSCOPY WITH ENDOBRONCHIAL NAVIGATION;  Surgeon: Tyler Pita, MD;  Location: [facility] ORS;  Service: Pulmonary;  Laterality: N/A;   WRIST SURGERY Left    fractures    OB History   No obstetric history on file.      Home Medications    Prior to Admission medications   Medication Sig Start Date End Date Taking? Authorizing Provider  neomycin-polymyxin-hydrocortisone (CORTISPORIN) 3.5-10000-1 OTIC suspension Place 3 drops into the left ear 3 (three) times daily for 7 days. 05/03/21 05/10/21 Yes Scot Jun, FNP  amiodarone (PACERONE) 200 MG tablet Take 400 mg by mouth daily. 03/03/21   [provider]  apixaban (ELIQUIS) 5 MG TABS tablet Take 1 tablet (5 mg total) by mouth 2 (two) times daily. 02/17/21   Lesleigh Noe, MD  Calcium Carb-Cholecalciferol (CALCIUM 600+D3 PO) Take 1 tablet by mouth in the morning.    [provider]  Dextromethorphan-guaiFENesin [facility] DM PO) Take 1 tablet by mouth 2 (two) times daily.    [provider]  Ferrous Gluconate (IRON 27 PO) Take 27 mg by mouth in the morning.    [provider]  glucose blood (CONTOUR NEXT TEST) test strip  Check sugar twice daily DX E11.69 02/24/21   Lesleigh Noe, MD  HYDROcodone-acetaminophen (NORCO) 5-325 MG tablet Take 1 tablet by mouth every 6 (six) hours as needed for moderate pain. 04/22/21   Hessie Knows, MD  JANUMET 50-1000 MG tablet Take 2 tablets by mouth in the morning. 02/08/17   [provider]  metoprolol succinate (TOPROL-XL) 25 MG 24 hr tablet Take 25 mg by mouth daily. 03/31/21   [provider]  pantoprazole (PROTONIX) 40 MG tablet Take 40 mg by mouth daily. 05/01/21   [provider]  pramipexole (MIRAPEX) 1 MG tablet Take 1 tablet (1 mg total) by mouth at bedtime. Appt with PCP needed for further refills. 11/27/20   Lesleigh Noe, MD  rosuvastatin (CRESTOR) 20 MG tablet Take 1 tablet (20 mg total) by mouth at bedtime. 01/17/21   Lesleigh Noe, MD  sertraline (ZOLOFT)  50 MG tablet TAKE 1 TABLET AT BEDTIME. PT NEEDS FOLLOW-UP APPT WITH PCP FOR FURTHER REFILLS. 04/08/21   Lynnda Child, MD  spironolactone (ALDACTONE) 25 MG tablet Take 25 mg by mouth daily. 05/01/21   [provider]  TRESIBA FLEXTOUCH 100 UNIT/ML SOPN FlexTouch Pen Inject 15 Units into the skin at bedtime. 03/26/17   [provider]    Family History Family History  Problem Relation Age of Onset   Other Mother        died from surgery   AAA (abdominal aortic aneurysm) Mother    Diabetes Father        controlled by diet   Dementia Father        brain atrophy - unknown origin   Breast cancer Cousin        maternal    Social History Social History   Tobacco Use   Smoking status: Former    Packs/day: 1.00    Years: 12.00    Pack years: 12.00    Types: Cigarettes    Quit date: 05/26/1975    Years since quitting: 45.9   Smokeless tobacco: Never  Vaping Use   Vaping Use: Never used  Substance Use Topics   Alcohol use: No    Comment: rarely   Drug use: Never     Allergies   Atorvastatin, Lantus [insulin glargine], and Lisinopril   Review of  Systems Review of Systems Pertinent negatives listed in HPI   Physical Exam Triage Vital Signs ED Triage Vitals [05/03/21 1433]  Enc Vitals Group     BP (!) 153/74     Pulse Rate (!) 56     Resp 18     Temp 98.9 F (37.2 C)     Temp Source Oral     SpO2 95 %     Weight      Height      Head Circumference      Peak Flow      Pain Score 4     Pain Loc      Pain Edu?      Excl. in [place]?    No data found.  Updated Vital Signs BP (!) 153/74 (BP Location: Left Arm)   Pulse (!) 56   Temp 98.9 F (37.2 C) (Oral)   Resp 18   SpO2 95%   Visual Acuity Right Eye Distance:   Left Eye Distance:   Bilateral Distance:    Right Eye Near:   Left Eye Near:    Bilateral Near:     Physical Exam Vitals reviewed.  Constitutional:      Appearance: Normal appearance.  HENT:     Right Ear: Decreased hearing noted. No middle ear effusion. Tympanic membrane is not erythematous.     Left Ear: Decreased hearing noted. A middle ear effusion is present. Tympanic membrane is erythematous.     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  Pulmonary:     Effort: Pulmonary effort is normal.     Breath sounds: Normal breath sounds.  Skin:    General: Skin is warm.  Neurological:     Mental Status: She is alert.     UC Treatments / Results  Labs (all labs ordered are listed, but only abnormal results are displayed) Labs Reviewed - No data to display  EKG  Radiology No results found.  Procedures Procedures (including critical care time)  Medications Ordered in UC Medications - No data to display  Initial Impression / Assessment and Plan / UC Course  I have reviewed the triage vital signs and the nursing notes.  Pertinent labs & imaging results that were available during my care of the patient were reviewed by me and considered in my medical decision making  (see chart for details).    Acute otitis externa Treatment today with Cortisporin Return precautions given if symptoms worsen or do not improve. Final Clinical Impressions(s) / UC Diagnoses   Final diagnoses:  Other infective acute otitis externa of left ear   Discharge Instructions   None    ED Prescriptions     Medication Sig Dispense Auth. Provider   neomycin-polymyxin-hydrocortisone (CORTISPORIN) 3.5-10000-1 OTIC suspension Place 3 drops into the left ear 3 (three) times daily for 7 days. 3.2 mL Scot Jun, FNP      PDMP not reviewed this encounter.   Scot Jun, FNP 05/03/21 [phone]

## 2021-05-03 NOTE — ED Triage Notes (Signed)
Pt c/o left ear pain that started today.

## 2021-05-05 ENCOUNTER — Ambulatory Visit
Admission: EM | Admit: 2021-05-05 | Discharge: 2021-05-05 | Disposition: A | Discharge status: Home / Self Care | Payer: Medicare Other | Attending: Emergency Medicine | Admitting: Emergency Medicine

## 2021-05-05 ENCOUNTER — Encounter: Payer: Self-pay | Admitting: Emergency Medicine

## 2021-05-05 ENCOUNTER — Other Ambulatory Visit: Payer: Self-pay

## 2021-05-05 ENCOUNTER — Telehealth: Payer: Self-pay

## 2021-05-05 DIAGNOSIS — H9202 Otalgia, left ear: Secondary | ICD-10-CM

## 2021-05-05 DIAGNOSIS — J0100 Acute maxillary sinusitis, unspecified: Secondary | ICD-10-CM

## 2021-05-05 MED ORDER — AMOXICILLIN 875 MG PO TABS: 875.0000 mg | ORAL_TABLET | Freq: Two times a day (BID) | ORAL | 0 refills | Status: AC

## 2021-05-05 NOTE — Discharge Instructions (Addendum)
Take the amoxicillin as directed.  Follow up with your primary care provider if your symptoms are not improving.   ° ° °

## 2021-05-05 NOTE — Telephone Encounter (Signed)
Pt walked in; pt seen 05/03/21 at [place] and given Neomycin Polymycin HC 3.5-10000-1 OTIC suspension using 3 gtts in lt ear tid. No improvement in ear.  05/03/21 lt ear felt full but was not hurting; today lt ear is hurting and pain above and behind lt ear. Pt said earlier hurt so bad pt felt like she was going to pass out but pt did not pass out.Today also pt has S/T that hurts when swallows. Pt has chronic prod cough with clear to white phlegm. BP 158/80 sit reg cuff rt arm; T 98.8. pt said when stands lt ear pain worsens. The pain in sharp and intermittent in lt ear. Pain level 9.5 when hurting. No available appts today at [facility] and pt is going back to [place] now by car and husband is driving. Sending note to Eugenia Pancoast FNP and [facility] CMA.

## 2021-05-05 NOTE — Telephone Encounter (Signed)
Ok noted, urgent care is appropriate recommendation.

## 2021-05-05 NOTE — Telephone Encounter (Signed)
Noted, and appropriate to send to  urgent care.

## 2021-05-05 NOTE — ED Provider Notes (Signed)
Renaldo Fiddler    CSN: [id number] Arrival date & time: 05/05/21  1159      History   Chief Complaint Chief Complaint  Patient presents with   Otalgia    HPI Natasha [name] is a 80 y.o. female.  Patient presents with left ear pain x 1.5 week.  She also reports sore throat, congestion, postnasal drip, and chronic cough.  No fever, rash, shortness of breath, or other symptoms.  She contacted her PCP and was instructed to come to urgent care.  Patient was seen at this urgent care on 05/03/2021; diagnosed with otitis externa; treated with Cortisporin eardrops.  The history is provided by the patient and medical records.   Past Medical History:  Diagnosis Date   A-fib [facility])    a.) CHA2DS2-VASc Score = 6 (age x 2, sex, HTN, aortic plaque, T2DM). 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 b.) TTE 12/21/2019 and 02/25/2021: EF 60-65%; no RWMAs; mild LA dilitation; mild-mod MR; G1DD.   Diastolic dysfunction    a.) TTE 12/21/2019: ED 60-65%, LA mild dilated, mild-mod MR; PASP 36.8; G1DD. b.) TTE 02/25/2021: normal LV function with mild concentric LVH; G1DD.   Diverticulitis    High cholesterol    History of hiatal hernia    Hypertension    Insomnia    Long term current use of anticoagulant    a.) apixaban   Mild asthma    Murmur    Non-small cell carcinoma of left lung, stage 1 (HCC) 09/30/2020   a.) clinical stage 1 (cT1cN0cM0). b.) treated with SBRT (60 cGy over 5 fractions).   OSA on CPAP    Osteoporosis    Restless leg    Sepsis (HCC)    T2DM (type 2 diabetes mellitus) [facility])     Patient Active  Problem List   Diagnosis Date Noted   Double vision 03/13/2021   Dizziness 03/13/2021   COVID-19 virus infection 02/24/2021   Atrial flutter (HCC) 02/24/2021   Chronic bilateral low back pain with bilateral sciatica 02/17/2021   Decreased sensation 11/27/2020   Dermatitis 11/27/2020   Axillary lymphadenopathy 10/11/2020   Cancer of upper lobe of left lung (HCC) 10/11/2020   Goals of care, counseling/discussion 10/11/2020   Status post reverse total shoulder replacement, 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   Diverticulitis of colon 02/03/2020   Chronic pain syndrome 01/12/2019   Neuropathic pain 01/12/2019   At high risk for falls 11/02/2018   Neuroforaminal stenosis of lumbar spine 11/02/2018   Sacroiliitis (HCC) 11/02/2018   Former smoker 09/23/2018   Spinal stenosis of lumbar region without neurogenic claudication 09/23/2018   Weakness of both hands 09/23/2018   Overweight (BMI 25.0-29.9) 08/10/2018   Insomnia 06/17/2018   Chronic kidney disease with symptom management only, stage 3 (moderate) (HCC) 05/27/2018   Nonrheumatic aortic valve stenosis 05/24/2018   Normocytic anemia 12/24/2017   Paroxysmal atrial fibrillation (HCC) 12/24/2017   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/24/2017   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/2017   Gastroesophageal reflux disease 11/17/2016   OSA (obstructive sleep apnea) 07/24/2016   Diverticulosis 07/21/2016   Osteopenia of multiple sites 06/04/2016  Closed compression fracture of thoracic vertebra ([place]) 04/02/2015   Mixed hyperlipidemia 04/02/2015   Restless leg syndrome 04/02/2015   Closed fracture of lateral portion of left tibial plateau 04/06/2014   Essential hypertension 04/06/2014   Mild intermittent asthma without complication 94/80/1655   Type 2 diabetes mellitus with other specified complication ([place]) 37/48/2707    Past Surgical History:  Procedure Laterality Date   ABDOMINAL HYSTERECTOMY  1978   APPENDECTOMY  1978   BREAST BIOPSY Left ?    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   JOINT REPLACEMENT     KNEE SURGERY Left    tibial fracture with metal plate   TOTAL SHOULDER REPLACEMENT Left 2014   ULNAR TUNNEL RELEASE Left 04/22/2021   Procedure: CUBITAL TUNNEL RELEASE;  Surgeon: Hessie Knows, MD;  Location: [facility] ORS;  Service: Orthopedics;  Laterality: Left;   VIDEO BRONCHOSCOPY WITH ENDOBRONCHIAL NAVIGATION N/A 09/30/2020   Procedure: ROBOTIC ASSISTED VIDEO BRONCHOSCOPY WITH ENDOBRONCHIAL NAVIGATION;  Surgeon: Tyler Pita, MD;  Location: [facility] ORS;  Service: Pulmonary;  Laterality: N/A;   WRIST SURGERY Left    fractures    OB History   No obstetric history on file.      Home Medications    Prior to Admission medications   Medication Sig Start Date End Date Taking? Authorizing Provider  amoxicillin (AMOXIL) 875 MG tablet Take 1 tablet (875 mg total) by mouth 2 (two) times daily for 7 days. 05/05/21 05/12/21 Yes Sharion Balloon, NP  amiodarone (PACERONE) 200 MG tablet Take 400 mg by mouth daily. 03/03/21   [provider]  apixaban (ELIQUIS) 5 MG TABS tablet Take 1 tablet (5 mg total) by mouth 2 (two) times daily. 02/17/21   Lesleigh Noe, MD  Calcium Carb-Cholecalciferol (CALCIUM 600+D3 PO) Take 1 tablet by mouth in the morning.    [provider]  Dextromethorphan-guaiFENesin [facility] DM PO) Take 1 tablet by mouth 2 (two) times daily.    [provider]  Ferrous Gluconate (IRON 27 PO) Take  27 mg by mouth in the morning.    [provider]  glucose blood (CONTOUR NEXT TEST) test strip Check sugar twice daily DX E11.69 02/24/21   Lesleigh Noe, MD  HYDROcodone-acetaminophen (NORCO) 5-325 MG tablet Take 1 tablet by mouth every 6 (six) hours as needed for moderate pain. 04/22/21   Hessie Knows, MD  JANUMET 50-1000 MG tablet Take 2 tablets by mouth in the morning. 02/08/17   [provider]  metoprolol succinate (TOPROL-XL) 25 MG 24 hr tablet Take 25 mg by mouth daily. 03/31/21   [provider]  neomycin-polymyxin-hydrocortisone (CORTISPORIN) 3.5-10000-1 OTIC suspension Place 3 drops into the left ear 3 (three) times daily for 7 days. 05/03/21 05/10/21  Scot Jun, FNP  pantoprazole (PROTONIX) 40 MG tablet Take 40 mg by mouth daily. 05/01/21   [provider]  pramipexole (MIRAPEX) 1  MG tablet Take 1 tablet (1 mg total) by mouth at bedtime. Appt with PCP needed for further refills. 11/27/20   Lesleigh Noe, MD  rosuvastatin (CRESTOR) 20 MG tablet Take 1 tablet (20 mg total) by mouth at bedtime. 01/17/21   Lesleigh Noe, MD  sertraline (ZOLOFT) 50 MG tablet TAKE 1 TABLET AT BEDTIME. PT NEEDS FOLLOW-UP APPT WITH PCP FOR FURTHER REFILLS. 04/08/21   Lesleigh Noe, MD  spironolactone (ALDACTONE) 25 MG tablet Take 25 mg by mouth daily. 05/01/21   [provider]  TRESIBA FLEXTOUCH 100 UNIT/ML SOPN FlexTouch Pen Inject 15 Units into the skin at bedtime. 03/26/17   [provider]    Family History Family History  Problem Relation Age of Onset   Other Mother        died from surgery   AAA (abdominal aortic aneurysm) Mother    Diabetes Father        controlled by diet   Dementia Father        brain atrophy - unknown origin   Breast cancer Cousin        maternal    Social History Social History   Tobacco Use   Smoking status: Former    Packs/day: 1.00    Years: 12.00    Pack years: 12.00    Types: Cigarettes    Quit  date: 05/26/1975    Years since quitting: 45.9   Smokeless tobacco: Never  Vaping Use   Vaping Use: Never used  Substance Use Topics   Alcohol use: No    Comment: rarely   Drug use: Never     Allergies   Atorvastatin, Lantus [insulin glargine], and Lisinopril   Review of Systems Review of Systems  Constitutional:  Negative for chills and fever.  HENT:  Positive for congestion, ear pain and postnasal drip. Negative for sore throat.   Respiratory:  Positive for cough. Negative for shortness of breath.   Gastrointestinal:  Negative for diarrhea and vomiting.  Skin:  Negative for color change and rash.  All other systems reviewed and are negative.   Physical Exam Triage Vital Signs ED Triage Vitals  Enc Vitals Group     BP 05/05/21 1344 119/67     Pulse Rate 05/05/21 1344 64     Resp 05/05/21 1344 18     Temp 05/05/21 1344 98.1 F (36.7 C)     Temp src --      SpO2 05/05/21 1344 97 %     Weight --      Height --      Head Circumference --      Peak Flow --      Pain Score 05/05/21 1343 8     Pain Loc --      Pain Edu? --      Excl. in [place]? --    No data found.  Updated Vital Signs BP 119/67 (BP Location: Left Arm)   Pulse 64   Temp 98.1 F (36.7 C)   Resp 18   SpO2 97%   Visual Acuity Right Eye Distance:   Left Eye Distance:   Bilateral Distance:    Right Eye Near:   Left Eye Near:    Bilateral Near:     Physical Exam Vitals and nursing note reviewed.  Constitutional:      General: She is not in acute distress.    Appearance: She is well-developed. She is not ill-appearing.  HENT:     Right  Ear: Tympanic membrane and ear canal normal.     Left Ear: Tympanic membrane and ear canal normal.     Nose: Congestion present.     Mouth/Throat:     Mouth: Mucous membranes are moist.     Pharynx: Oropharynx is clear.  Cardiovascular:     Rate and Rhythm: Normal rate and regular rhythm.     Heart sounds: Normal heart sounds.  Pulmonary:     Effort:  Pulmonary effort is normal. No respiratory distress.     Breath sounds: Normal breath sounds.  Musculoskeletal:     Cervical back: Neck supple.  Skin:    General: Skin is warm and dry.  Neurological:     Mental Status: She is alert.  Psychiatric:        Mood and Affect: Mood normal.        Behavior: Behavior normal.     UC Treatments / Results  Labs (all labs ordered are listed, but only abnormal results are displayed) Labs Reviewed - No data to display  EKG   Radiology No results found.  Procedures Procedures (including critical care time)  Medications Ordered in UC Medications - No data to display  Initial Impression / Assessment and Plan / UC Course  I have reviewed the triage vital signs and the nursing notes.  Pertinent labs & imaging results that were available during my care of the patient were reviewed by me and considered in my medical decision making (see chart for details).   Left otalgia, acute sinusitis. Treating with amoxicillin.  Tylenol as needed for discomfort.  Instructed patient to follow-up with her PCP if her symptoms are not improving.  She agrees to communicate   Final Clinical Impressions(s) / UC Diagnoses   Final diagnoses:  Acute otalgia, left  Acute non-recurrent maxillary sinusitis     Discharge Instructions      Take the amoxicillin as directed.  Follow up with your primary care provider if your symptoms are not improving.         ED Prescriptions     Medication Sig Dispense Auth. Provider   amoxicillin (AMOXIL) 875 MG tablet Take 1 tablet (875 mg total) by mouth 2 (two) times daily for 7 days. 14 tablet Sharion Balloon, NP      PDMP not reviewed this encounter.   Sharion Balloon, NP 05/05/21 1419

## 2021-05-05 NOTE — ED Triage Notes (Signed)
Pt was seen 12/10 for left ear pain it is not better. Her pain is behind her lobe and radiates up to her left temple.

## 2021-05-08 ENCOUNTER — Ambulatory Visit: Payer: Medicare Other | Admitting: Pulmonary Disease

## 2021-05-09 ENCOUNTER — Ambulatory Visit: Payer: Medicare Other | Admitting: Family Medicine

## 2021-05-16 ENCOUNTER — Ambulatory Visit
Admission: RE | Admit: 2021-05-16 | Discharge: 2021-05-16 | Disposition: A | Discharge status: Home / Self Care | Payer: Medicare Other | Attending: Cardiovascular Disease | Admitting: Cardiovascular Disease

## 2021-05-16 ENCOUNTER — Other Ambulatory Visit: Payer: Self-pay | Admitting: Cardiovascular Disease

## 2021-05-16 ENCOUNTER — Ambulatory Visit
Admission: RE | Admit: 2021-05-16 | Discharge: 2021-05-16 | Disposition: A | Discharge status: Home / Self Care | Payer: Medicare Other | Source: Ambulatory Visit | Attending: Cardiovascular Disease | Admitting: Cardiovascular Disease

## 2021-05-16 ENCOUNTER — Other Ambulatory Visit: Payer: Self-pay

## 2021-05-16 DIAGNOSIS — R059 Cough, unspecified: Secondary | ICD-10-CM

## 2021-06-03 NOTE — Progress Notes (Signed)
Radiation Oncology Follow up Note  Name: Natasha [name]   Date:   04/14/2021 MRN:  [id number] DOB: April 09, 1941    This 81 y.o. 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  Seen today in routine follow-up she continues to have a persistent cough no hemoptysis or chest tightness.  No dysphagia..  CT scan this month shows regression of the treated lesion left upper lobe with evolving surrounding postradiation changes.  Prominent left hilar nodes are similar to prior exam.  COMPLICATIONS OF TREATMENT: none  FOLLOW UP COMPLIANCE: keeps appointments   PHYSICAL EXAM:  BP 133/75 (BP Location: Left Arm, Patient Position: Sitting)    Pulse (!) 54    Resp 16    Wt 153 lb 3.2 oz (69.5 kg)    BMI 26.30 kg/m  Well-developed well-nourished patient in NAD. HEENT reveals PERLA, EOMI, discs not visualized.  Oral cavity is clear. No oral mucosal lesions are identified. Neck is clear without evidence of cervical or supraclavicular adenopathy. Lungs are clear to A&P. Cardiac examination is essentially unremarkable with regular rate and rhythm without murmur rub or thrill. Abdomen is benign with no organomegaly or masses noted. Motor sensory and DTR levels are equal and symmetric in the upper and lower extremities. Cranial nerves II through XII are grossly intact. Proprioception is intact. No peripheral adenopathy or edema is identified. No motor or sensory levels are noted. Crude visual fields are within normal range.  RADIOLOGY RESULTS: CT scans reviewed compatible with above-stated findings  PLAN: Present time patient is doing well continues to have cough which is not unexpected.  I have  asked to see her back in 6 months for follow-up with repeat CT scan at that time.  Patient is scheduled to have carpal tunnel surgery.  Patient knows to call with any concerns.  I would like to take this opportunity to thank you for allowing me to participate in the care of your patient.Noreene Filbert, MD

## 2021-06-11 ENCOUNTER — Telehealth: Payer: Self-pay

## 2021-06-11 NOTE — Telephone Encounter (Signed)
Spoke to patient and requested that she bring SD card to OV tomorrow.

## 2021-06-12 ENCOUNTER — Encounter: Payer: Self-pay | Admitting: Pulmonary Disease

## 2021-06-12 ENCOUNTER — Other Ambulatory Visit: Payer: Self-pay

## 2021-06-12 ENCOUNTER — Ambulatory Visit (INDEPENDENT_AMBULATORY_CARE_PROVIDER_SITE_OTHER): Payer: Medicare Other | Admitting: Pulmonary Disease

## 2021-06-12 VITALS — BP 122/66 | HR 81 | Temp 98.1°F | Ht 64.0 in | Wt 143.8 lb

## 2021-06-12 DIAGNOSIS — C3492 Malignant neoplasm of unspecified part of left bronchus or lung: Secondary | ICD-10-CM

## 2021-06-12 DIAGNOSIS — R0602 Shortness of breath: Secondary | ICD-10-CM | POA: Diagnosis not present

## 2021-06-12 DIAGNOSIS — G4733 Obstructive sleep apnea (adult) (pediatric): Secondary | ICD-10-CM

## 2021-06-12 DIAGNOSIS — R059 Cough, unspecified: Secondary | ICD-10-CM | POA: Diagnosis not present

## 2021-06-12 DIAGNOSIS — I350 Nonrheumatic aortic (valve) stenosis: Secondary | ICD-10-CM | POA: Diagnosis not present

## 2021-06-12 MED ORDER — BENZONATATE 200 MG PO CAPS: 200.0000 mg | ORAL_CAPSULE | Freq: Three times a day (TID) | ORAL | 1 refills | Status: DC | PRN

## 2021-06-12 MED ORDER — LEVALBUTEROL TARTRATE 45 MCG/ACT IN AERO: 2.0000 | INHALATION_SPRAY | Freq: Three times a day (TID) | RESPIRATORY_TRACT | 2 refills | Status: AC | PRN

## 2021-06-12 MED ORDER — BREZTRI AEROSPHERE 160-9-4.8 MCG/ACT IN AERO: 160.0000 ug | INHALATION_SPRAY | Freq: Two times a day (BID) | RESPIRATORY_TRACT | 0 refills | Status: DC

## 2021-06-12 NOTE — Progress Notes (Signed)
Subjective:    Patient ID: Natasha [name], female    DOB: 20-May-1941, 81 y.o.   MRN: [id number] Chief Complaint  Patient presents with   Follow-up    HPI [name] is an 81 year old remote former smoker who presents for follow-up on the issue of left upper lobe adenocarcinoma.  She is status post SBRT to the left upper lobe for clinical stage T1 N0 M0 adenocarcinoma.  Completed SBRT to the left uppe around June beginning of July 2022.  She had noted some increased cough after SBRT but this was not bothersome to her.  However sometime in October 2022 she was diagnosed with COVID and required hospitalization.  Admitted on 3 October to [facility] discharged on 5 October.  She had issues with her A. fib/flutter going out of control and requiring Cardizem infusion as well as metoprolol.  She converted to normal sinus rhythm.  She follows with Dr. Chancy Milroy for these issues.  She is currently on amiodarone for control of her A. fib/flutter.  Since her COVID diagnosis she has had worsening of her cough productive of very thick, tenacious, whitish to grayish sputum.  No hemoptysis.  She has not had any fevers, chills or sweats since her COVID diagnosis.  This is her main complaint today.  She states that the cough has been very difficult to control.  Really not on any inhalers.  She has also noted some intermittent shortness of breath.  No chest pain.  Orthopnea, paroxysmal nocturnal dyspnea or lower extremity edema.  She has sleep apnea and states she is compliant with her CPAP.  Last PFTs performed March 2017 show mild ventilatory defect with moderate defect in gas transfer.  This was done through [place], [employer].  At that time she carried a diagnosis of mild persistent asthma.  Review of Systems A 10 point review of systems was performed and it is as noted above otherwise negative.  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   Diverticulitis of colon 02/03/2020   Chronic pain syndrome 01/12/2019   Neuropathic pain 01/12/2019   At high risk for falls 11/02/2018   Neuroforaminal stenosis of lumbar spine 11/02/2018   Sacroiliitis ([place]) 11/02/2018   Former smoker 09/23/2018   Spinal stenosis of lumbar region without neurogenic claudication 09/23/2018   Weakness of both hands 09/23/2018   Overweight (BMI 25.0-29.9) 08/10/2018   Insomnia 06/17/2018   Chronic kidney disease with symptom management only, stage 3 (moderate) ([place]) 05/27/2018   Nonrheumatic aortic valve stenosis 05/24/2018   Normocytic anemia 12/24/2017   Paroxysmal atrial fibrillation ([place]) 12/24/2017   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/24/2017   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/2017   Gastroesophageal reflux disease 11/17/2016   OSA (obstructive sleep apnea) 07/24/2016   Diverticulosis 07/21/2016   Osteopenia of multiple sites 06/04/2016   Closed compression fracture of thoracic vertebra ([place]) 04/02/2015   Mixed hyperlipidemia 04/02/2015   Restless leg syndrome 04/02/2015   Closed fracture of lateral portion of left tibial plateau 04/06/2014   Essential hypertension 04/06/2014   Mild intermittent asthma without complication  04/06/2014   Type 2 diabetes mellitus with other specified complication ([place]) 16/11/3708   Social History   Tobacco Use   Smoking status: Former    Packs/day: 1.00    Years: 12.00    Pack years: 12.00    Types: Cigarettes    Quit date: 05/26/1975    Years since quitting: 46.0   Smokeless tobacco: Never  Substance Use Topics    Alcohol use: No    Comment: rarely   Allergies  Allergen Reactions   Atorvastatin     Muscle/joint aches   Lantus [Insulin Glargine] Hives   Lisinopril Hives and Cough        Current Meds  Medication Sig   amiodarone (PACERONE) 200 MG tablet Take 400 mg by mouth daily.   apixaban (ELIQUIS) 5 MG TABS tablet Take 1 tablet (5 mg total) by mouth 2 (two) times daily.   Calcium Carb-Cholecalciferol (CALCIUM 600+D3 PO) Take 1 tablet by mouth in the morning.   Ferrous Gluconate (IRON 27 PO) Take 27 mg by mouth in the morning.   glucose blood (CONTOUR NEXT TEST) test strip Check sugar twice daily DX E11.69   HYDROcodone-acetaminophen (NORCO) 5-325 MG tablet Take 1 tablet by mouth every 6 (six) hours as needed for moderate pain.   JANUMET 50-1000 MG tablet Take 2 tablets by mouth in the morning.   metoprolol succinate (TOPROL-XL) 25 MG 24 hr tablet Take 25 mg by mouth daily.   pantoprazole (PROTONIX) 40 MG tablet Take 40 mg by mouth daily.   pramipexole (MIRAPEX) 1 MG tablet Take 1 tablet (1 mg total) by mouth at bedtime. Appt with PCP needed for further refills.   rosuvastatin (CRESTOR) 20 MG tablet Take 1 tablet (20 mg total) by mouth at bedtime.   sertraline (ZOLOFT) 50 MG tablet TAKE 1 TABLET AT BEDTIME. PT NEEDS FOLLOW-UP APPT WITH PCP FOR FURTHER REFILLS.   spironolactone (ALDACTONE) 25 MG tablet Take 25 mg by mouth daily.   TRESIBA FLEXTOUCH 100 UNIT/ML SOPN FlexTouch Pen Inject 15 Units into the skin at bedtime.   Immunization History  Administered Date(s) Administered   Fluad Quad(high Dose 65+) 02/26/2021   Influenza Split 03/20/2014   Influenza, High Dose Seasonal PF 05/08/2020   Influenza,inj,Quad PF,6+ Mos 03/13/2017, 03/17/2018   Influenza-Unspecified 03/17/2012, 03/23/2013, 03/22/2014, 03/22/2015, 03/25/2016, 02/23/2019   Moderna SARS-COV2 Booster Vaccination 07/26/2019   Moderna Sars-Covid-2 Vaccination 06/28/2019, 05/14/2020   Pneumococcal Conjugate-13 10/25/2006,  05/11/2014   Pneumococcal Polysaccharide-23 10/31/2008, 04/06/2012       Objective:   Physical Exam BP 122/66 (BP Location: Left Arm, Patient Position: Sitting, Cuff Size: Normal)    Pulse 81    Temp 98.1 F (36.7 C) (Oral)    Ht $R'5\' 4"'EL$  (1.626 m)    Wt 143 lb 12.8 oz (65.2 kg)    SpO2 98%    BMI 24.68 kg/m  GENERAL: Awake, alert, quite spry, fully ambulatory.  No acute distress.  No conversational dyspnea. HEAD: Normocephalic, atraumatic. EYES: Pupils equal, round, reactive to light.  No scleral icterus. MOUTH: Nose/mouth/throat not examined due to masking requirements for COVID 19. NECK: Supple. No thyromegaly. Trachea midline. No JVD.  No adenopathy. PULMONARY: Good air entry bilaterally.  Coarse breath sounds throughout.  Otherwise no adventitious sounds. CARDIOVASCULAR: S1 and S2. Regular rate and rhythm.  Grade 2/6 systolic ejection murmur left sternal border. ABDOMEN: Benign. MUSCULOSKELETAL: No joint deformity, no clubbing, no edema. NEUROLOGIC: No focal deficit, no gait disturbance, speech is fluent. SKIN: Intact,warm,dry.  No rashes. PSYCH:  Mood and behavior normal.     Assessment & Plan:     ICD-10-CM   1. SOB (shortness of breath)  R06.02 Pulmonary Function Test [facility] Only   We will obtain pulmonary function testing Compare to 2017 PFTs    2. Cough, unspecified type  R05.9 Pulmonary Function Test [facility] Only   Possible reactive airways disease Trial of Breztri 2 puffs twice a day Opening as needed Tessalon Perles as needed    3. Non-small cell cancer of left lung (HCC)  C34.92    That is post SBRT No evidence of recurrence T1 N0 M0    4. Nonrheumatic aortic valve stenosis  I35.0    This issue adds complexity to her management Followed by cardiology    5. OSA (obstructive sleep apnea)  G47.33    Patient claims compliance Compliance download will be checked on follow-up with NP 17 February     Orders Placed This Encounter  Procedures   Pulmonary Function Test  [facility] Only    Standing Status:   Future    Standing Expiration Date:   06/12/2022    Order Specific Question:   Full PFT: includes the following: basic spirometry, spirometry pre & post bronchodilator, diffusion capacity (DLCO), lung volumes    Answer:   Full PFT    Order Specific Question:   This test can only be performed at    Answer:   [place] ordered this encounter  Medications   Budeson-Glycopyrrol-Formoterol (BREZTRI AEROSPHERE) 160-9-4.8 MCG/ACT AERO    Sig: Inhale 160 mcg into the lungs 2 (two) times daily.    Dispense:  5.9 g    Refill:  0    Order Specific Question:   Lot Number?    Answer:   [id number]    Order Specific Question:   Expiration Date?    Answer:   03/25/2023    Order Specific Question:   Quantity    Answer:   2   levalbuterol (XOPENEX HFA) 45 MCG/ACT inhaler    Sig: Inhale 2 puffs into the lungs every 8 (eight) hours as needed for wheezing or shortness of breath (cough).    Dispense:  1 each    Refill:  2   benzonatate (TESSALON) 200 MG capsule    Sig: Take 1 capsule (200 mg total) by mouth 3 (three) times daily as needed for cough.    Dispense:  20 capsule    Refill:  1   We have instructed the patient to try to abolish cough reflex with nonventilated cough drops or hard candy to try to avoid vicious cycle of coughing.  We have given her a trial of Breztri 2 puffs twice a day as above as well as as needed Xopenex.  She is to let us know how this regimen works for her so that we can place a prescription to her pharmacy of choice.  Patient also was provided with Ladona Ridgel and given instructions on using Delsym over-the-counter.  We will see her in follow-up in 4 to 6 weeks time she is to contact us prior to that time should any new difficulties arise.  Renold Don, MD Advanced Bronchoscopy [facility]-    *This note was dictated using voice recognition software/Dragon.  Despite best efforts to proofread,  errors can occur which can change the meaning. Any transcriptional errors that result from this process are unintentional and may not be fully corrected at the time of dictation.

## 2021-06-12 NOTE — Patient Instructions (Signed)
We are going to get some breathing tests to check on your lung function and also to give [place] an idea of the function of your airways.  For cough you may use cough drops that do not have any mint or menthol and then (cherry Ludens are good) or you can use hard candy such as Anise Salvo this is to help mitigate the cough reflex once you start coughing.  We are giving you a trial of an inhaler called Breztri 2 puffs twice a day to help with the inflammation in your airways.  Make sure you rinse your mouth well after you use it.  We are also giving you an emergency inhaler that you can use up to 3 times a day if you get into episodes of shortness of breath or coughing.  We are also giving you some Tessalon Perles you can take up to 3 times a day as needed for cough.  Over-the-counter you can also use Delsym 1 teaspoon twice a day as needed for cough.  We will see him in follow-up in 4 to 6 weeks time call sooner should any new difficulties arise.  You may see me or our nurse practitioner at that time depending on the schedule.

## 2021-06-16 ENCOUNTER — Encounter: Payer: Self-pay | Admitting: Family

## 2021-06-16 ENCOUNTER — Ambulatory Visit (INDEPENDENT_AMBULATORY_CARE_PROVIDER_SITE_OTHER): Payer: Medicare Other | Admitting: Family

## 2021-06-16 ENCOUNTER — Other Ambulatory Visit: Payer: Self-pay

## 2021-06-16 VITALS — BP 136/74 | HR 71 | Temp 96.9°F | Ht 64.0 in | Wt 141.0 lb

## 2021-06-16 DIAGNOSIS — M5441 Lumbago with sciatica, right side: Secondary | ICD-10-CM

## 2021-06-16 DIAGNOSIS — M6281 Muscle weakness (generalized): Secondary | ICD-10-CM | POA: Insufficient documentation

## 2021-06-16 DIAGNOSIS — M2550 Pain in unspecified joint: Secondary | ICD-10-CM | POA: Diagnosis not present

## 2021-06-16 DIAGNOSIS — D649 Anemia, unspecified: Secondary | ICD-10-CM | POA: Diagnosis not present

## 2021-06-16 DIAGNOSIS — G8929 Other chronic pain: Secondary | ICD-10-CM

## 2021-06-16 DIAGNOSIS — M5442 Lumbago with sciatica, left side: Secondary | ICD-10-CM

## 2021-06-16 DIAGNOSIS — E875 Hyperkalemia: Secondary | ICD-10-CM | POA: Diagnosis not present

## 2021-06-16 DIAGNOSIS — R2681 Unsteadiness on feet: Secondary | ICD-10-CM

## 2021-06-16 DIAGNOSIS — G2581 Restless legs syndrome: Secondary | ICD-10-CM | POA: Diagnosis not present

## 2021-06-16 DIAGNOSIS — M79604 Pain in right leg: Secondary | ICD-10-CM | POA: Insufficient documentation

## 2021-06-16 DIAGNOSIS — M25561 Pain in right knee: Secondary | ICD-10-CM

## 2021-06-16 LAB — CBC WITH DIFFERENTIAL/PLATELET
Basophils Absolute: 0 10*3/uL (ref 0.0–0.1)
Basophils Relative: 0.6 % (ref 0.0–3.0)
Eosinophils Absolute: 0.1 10*3/uL (ref 0.0–0.7)
Eosinophils Relative: 0.6 % (ref 0.0–5.0)
HCT: 33.6 % — ABNORMAL LOW (ref 36.0–46.0)
Hemoglobin: 10.8 g/dL — ABNORMAL LOW (ref 12.0–15.0)
Lymphocytes Relative: 15.1 % (ref 12.0–46.0)
Lymphs Abs: 1.3 10*3/uL (ref 0.7–4.0)
MCHC: 32.3 g/dL (ref 30.0–36.0)
MCV: 88.3 fl (ref 78.0–100.0)
Monocytes Absolute: 0.6 10*3/uL (ref 0.1–1.0)
Monocytes Relative: 6.9 % (ref 3.0–12.0)
Neutro Abs: 6.6 10*3/uL (ref 1.4–7.7)
Neutrophils Relative %: 76.8 % (ref 43.0–77.0)
Platelets: 485 10*3/uL — ABNORMAL HIGH (ref 150.0–400.0)
RBC: 3.8 Mil/uL — ABNORMAL LOW (ref 3.87–5.11)
RDW: 16.4 % — ABNORMAL HIGH (ref 11.5–15.5)
WBC: 8.6 10*3/uL (ref 4.0–10.5)

## 2021-06-16 LAB — COMPREHENSIVE METABOLIC PANEL
ALT: 13 U/L (ref 0–35)
AST: 18 U/L (ref 0–37)
Albumin: 3.9 g/dL (ref 3.5–5.2)
Alkaline Phosphatase: 115 U/L (ref 39–117)
BUN: 30 mg/dL — ABNORMAL HIGH (ref 6–23)
CO2: 22 mEq/L (ref 19–32)
Calcium: 9.2 mg/dL (ref 8.4–10.5)
Chloride: 101 mEq/L (ref 96–112)
Creatinine, Ser: 2 mg/dL — ABNORMAL HIGH (ref 0.40–1.20)
GFR: 23.09 mL/min — ABNORMAL LOW (ref >60.00)
Glucose, Bld: 185 mg/dL — ABNORMAL HIGH (ref 70–99)
Potassium: 5.2 mEq/L — ABNORMAL HIGH (ref 3.5–5.1)
Sodium: 134 mEq/L — ABNORMAL LOW (ref 135–145)
Total Bilirubin: 0.4 mg/dL (ref 0.2–1.2)
Total Protein: 6.5 g/dL (ref 6.0–8.3)

## 2021-06-16 LAB — MAGNESIUM: Magnesium: 1.9 mg/dL (ref 1.5–2.5)

## 2021-06-16 LAB — B12 AND FOLATE PANEL
Folate: 13.8 ng/mL (ref >5.9)
Vitamin B-12: 194 pg/mL — ABNORMAL LOW (ref 211–911)

## 2021-06-16 LAB — CK: Total CK: 19 U/L (ref 7–177)

## 2021-06-16 LAB — SEDIMENTATION RATE: Sed Rate: 83 mm/hr — ABNORMAL HIGH (ref 0–30)

## 2021-06-16 NOTE — Assessment & Plan Note (Signed)
Continue follow-up with orthospine as scheduled.

## 2021-06-16 NOTE — Assessment & Plan Note (Signed)
Patient states this is acting up no longer stable. Is well controlled typically on ropinirole 1 g nightly.  Ordering B12 folate potassium and magnesium to see if there is any concern there.  Also recommended that patient discontinue rosuvastatin x1 week to see if there is improvement in the muscular pain if this is to occur and she does find relief then we will have to consider alternatives

## 2021-06-16 NOTE — Assessment & Plan Note (Signed)
We will repeat CBC pending results

## 2021-06-16 NOTE — Assessment & Plan Note (Signed)
Venous Doppler ultrasound to rule out DVT or right knee Baker's cyst.  Pending results patient is scheduled from [facility].

## 2021-06-16 NOTE — Assessment & Plan Note (Signed)
Work-up for ANA rheumatoid factor and sed rate pending.

## 2021-06-16 NOTE — Patient Instructions (Addendum)
Stop by the lab prior to leaving today. I will notify you of your results once received.   I have ordered an x-ray of your right knee as well as an ultrasound of your right knee please schedule a [place] [facility] to rule out any causes. Please call [facility] outpatient to set these two appointments [phone])  If we need to I can refer you to orthopedist as those you currently see do not involve the knee, however, let's pending these other results prior.   At the onset of this pain has been said in the last 1 week I do suggest that you do a trial of stopping the rosuvastatin for the next 1 week.  Please let me know if you have improvement with the discontinuation of the Crestor as this could be a reason for the increased muscle pains.  It was a pleasure seeing you today! Please do not hesitate to reach out with any questions and or concerns.  Regards,   Eugenia Pancoast FNP-C

## 2021-06-16 NOTE — Progress Notes (Signed)
Established Patient Office Visit  Subjective:  Patient ID: Natasha [name], female    DOB: [birth year]  Age: 81 y.o. MRN: [id number]  CC:  Chief Complaint  Patient presents with   Joint Pain    Shoulders, hips, back, right elbow, bilateral wrists, right knee and shin.    HPI Natasha [name] is here today with concerns.   In the last one week aching deep pain right knee, with pain within the knee, the muscles going up on the upper anterior thigh as well as the front anterior upper shin of the right leg. The pain is constant for the most part, no change with movement. She doesn't note any swelling and or edema. She does have some tenderness behind the knee as well within the note.   She does have h/o chronic low back pain, and she feels now her bil hips are suffering from this. She has herniated disc in her lower back with doesn't help and she Is experiencing bil sciatic.   She does have history of restless legs syndrome and states as of recent, these symptoms have worsened as well.   Past Medical History:  Diagnosis Date   A-fib [facility])    a.) CHA2DS2-VASc Score = 6 (age x 2, sex, HTN, aortic plaque, T2DM). 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 b.) TTE 12/21/2019 and 02/25/2021: EF 60-65%; no RWMAs; mild LA dilitation; mild-mod MR; G1DD.   Diastolic dysfunction    a.) TTE 12/21/2019: ED 60-65%, LA mild dilated, mild-mod MR; PASP 36.8; G1DD. b.) TTE 02/25/2021: normal LV function with mild concentric LVH; G1DD.   Diverticulitis    High cholesterol    History of hiatal hernia    Hypertension    Insomnia     Long term current use of anticoagulant    a.) apixaban   Mild asthma    Murmur    Non-small cell carcinoma of left lung, stage 1 ([place]) 09/30/2020   a.) clinical stage 1 (cT1cN0cM0). b.) treated with SBRT (60 cGy over 5 fractions).   OSA on CPAP    Osteoporosis    Restless leg    Sepsis ([place])    T2DM (type 2 diabetes mellitus) ([place])     Past Surgical History:  Procedure Laterality Date   ABDOMINAL HYSTERECTOMY  1978   APPENDECTOMY  1978   BREAST BIOPSY Left ?   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   JOINT REPLACEMENT     KNEE SURGERY Left    tibial fracture with metal  plate   TOTAL SHOULDER REPLACEMENT Left 08-07-12   ULNAR TUNNEL RELEASE Left 04/22/2021   Procedure: CUBITAL TUNNEL RELEASE;  Surgeon: Hessie Knows, MD;  Location: [facility] ORS;  Service: Orthopedics;  Laterality: Left;   VIDEO BRONCHOSCOPY WITH ENDOBRONCHIAL NAVIGATION N/A 09/30/2020   Procedure: ROBOTIC ASSISTED VIDEO BRONCHOSCOPY WITH ENDOBRONCHIAL NAVIGATION;  Surgeon: Tyler Pita, MD;  Location: [facility] ORS;  Service: Pulmonary;  Laterality: N/A;   WRIST SURGERY Left    fractures    Family History  Problem Relation Age of Onset   Other Mother        died from surgery   AAA (abdominal aortic aneurysm) Mother    Diabetes Father        controlled by diet   Dementia Father        brain atrophy - unknown origin   Breast cancer Cousin        maternal    Social History   Socioeconomic History   Marital  status: Married    Spouse name: Scientist, physiological   Number of children: 1   Years of education: some college   Highest education level: Not on file  Occupational History   Not on file  Tobacco Use   Smoking status: Former    Packs/day: 1.00    Years: 12.00    Pack years: 12.00    Types: Cigarettes    Quit date: 05/26/1975    Years since quitting: 46.0   Smokeless tobacco: Never  Vaping Use   Vaping Use: Never used  Substance and Sexual Activity   Alcohol use: No    Comment: rarely   Drug use: Never   Sexual activity: Not Currently  Other Topics Concern   Not on file  Social History Narrative   07/22/20   [place]   Living: with husband, Scientist, physiological [phone])   Work: retired - high end Journalist, newspaper      Family: grandson - Ovid Curd 08/08/1998) (son is deceased) - and living with them      Enjoys: Enjoys Social worker, going to art shows, gardening and painting      Exercise: not currently   Diet: does not follow diabetic diet      Safety   Seat belts: Yes    Guns: Yes  and secure   Safe in relationships: Yes    Social Determinants of Health   Financial Resource Strain: Not on file  Food Insecurity: Not on file  Transportation Needs: Not on file  Physical Activity: Not on file  Stress: Not on file  Social Connections: Not on file  Intimate Partner Violence: Not on file    Outpatient Medications Prior to Visit  Medication Sig Dispense Refill   amiodarone (PACERONE) 200 MG tablet Take 400 mg by mouth daily.     apixaban (ELIQUIS) 5 MG TABS tablet Take 1 tablet (5 mg total) by mouth 2 (two) times daily. 180 tablet 1   Budeson-Glycopyrrol-Formoterol (BREZTRI AEROSPHERE) 160-9-4.8 MCG/ACT AERO Inhale 160 mcg into the lungs 2 (two) times daily. 5.9 g 0   Calcium Carb-Cholecalciferol (CALCIUM 600+D3 PO) Take 1 tablet by mouth in the morning.     Ferrous Gluconate (IRON 27 PO) Take 27 mg by mouth in the morning.     glucose blood (CONTOUR NEXT TEST)  test strip Check sugar twice daily DX E11.69 100 each 12   HYDROcodone-acetaminophen (NORCO) 5-325 MG tablet Take 1 tablet by mouth every 6 (six) hours  as needed for moderate pain. 30 tablet 0   JANUMET 50-1000 MG tablet Take 2 tablets by mouth in the morning.     JARDIANCE 25 MG TABS tablet Take 25 mg by mouth daily.     levalbuterol (XOPENEX HFA) 45 MCG/ACT inhaler Inhale 2 puffs into the lungs every 8 (eight) hours as needed for wheezing or shortness of breath (cough). 1 each 2   metoprolol succinate (TOPROL-XL) 25 MG 24 hr tablet Take 25 mg by mouth daily.     pantoprazole (PROTONIX) 40 MG tablet Take 40 mg by mouth daily.     pramipexole (MIRAPEX) 1 MG tablet Take 1 tablet (1 mg total) by mouth at bedtime. Appt with PCP needed for further refills. 90 tablet 3   rosuvastatin (CRESTOR) 20 MG tablet Take 1 tablet (20 mg total) by mouth at bedtime. 90 tablet 3   sertraline (ZOLOFT) 50 MG tablet TAKE 1 TABLET AT BEDTIME. PT NEEDS FOLLOW-UP APPT WITH PCP FOR FURTHER REFILLS. 90 tablet 0   spironolactone (ALDACTONE) 25 MG tablet Take 25 mg by mouth daily.     TRESIBA FLEXTOUCH 100 UNIT/ML SOPN FlexTouch Pen Inject 15 Units into the skin at bedtime.     benzonatate (TESSALON) 200 MG capsule Take 1 capsule (200 mg total) by mouth 3 (three) times daily as needed for cough. 20 capsule 1   No facility-administered medications prior to visit.    Allergies  Allergen Reactions   Atorvastatin     Muscle/joint aches   Lantus [Insulin Glargine] Hives   Lyrica [Pregabalin] Other (See Comments)    Headache, disorientation   Lisinopril Hives and Cough         ROS Review of Systems  Constitutional:  Positive for fatigue. Negative for chills and fever.  Respiratory:  Negative for shortness of breath.   Cardiovascular:  Negative for chest pain, palpitations and leg swelling.  Musculoskeletal:  Positive for arthralgias, back pain (lower back pain, chronic with sciatica) and myalgias (muscle pains,  right upper thigh). Negative for gait problem. Joint swelling: right knee pain, right elbow pain, bil hip pain. Skin:  Negative for color change.     Objective:    Physical Exam Constitutional:      General: She is not in acute distress.    Appearance: Normal appearance. She is normal weight. She is not ill-appearing, toxic-appearing or diaphoretic.  HENT:     Head: Normocephalic.  Musculoskeletal:     Right knee: Swelling (upper left of right knee patella) present. No erythema. Decreased range of motion (painful with flexion/extension). Tenderness (point tenderness right upper anterior shin right leg) present.     Left knee: Normal.     Comments: Negative homans sign right knee   Neurological:     Mental Status: She is alert.    BP 136/74    Pulse 71    Temp (!) 96.9 F (36.1 C) (Temporal)    Ht $R'5\' 4"'KV$  (1.626 m)    Wt 141 lb (64 kg)    SpO2 95%    BMI 24.20 kg/m  Wt Readings from Last 3 Encounters:  06/16/21 141 lb (64 kg)  06/12/21 143 lb 12.8 oz (65.2 kg)  04/22/21 153 lb (69.4 kg)     Health Maintenance Due  Topic Date Due   FOOT EXAM  Never done   URINE MICROALBUMIN  Never done   TETANUS/TDAP  Never done   Zoster Vaccines- Shingrix (1 of 2) Never done   DEXA SCAN  Never  done   OPHTHALMOLOGY EXAM  01/31/2021    There are no preventive care reminders to display for this patient.  No results found for: TSH Lab Results  Component Value Date   WBC 9.8 03/09/2021   HGB 11.0 (L) 03/09/2021   HCT 32.8 (L) 03/09/2021   MCV 85.6 03/09/2021   PLT 264 03/09/2021   Lab Results  Component Value Date   NA 137 03/09/2021   K 3.8 03/09/2021   CO2 19 (L) 03/09/2021   GLUCOSE 198 (H) 03/09/2021   BUN 23 03/09/2021   CREATININE 1.70 (H) 04/10/2021   BILITOT 0.8 02/24/2021   ALKPHOS 54 02/24/2021   AST 31 02/24/2021   ALT 18 02/24/2021   PROT 6.9 02/24/2021   ALBUMIN 4.1 02/24/2021   CALCIUM 8.3 (L) 03/09/2021   ANIONGAP 10 03/09/2021   GFR 38.96 (L) 07/22/2020    Lab Results  Component Value Date   HGBA1C 9.4 (H) 02/24/2021      Assessment & Plan:   Problem List Items Addressed This Visit       Nervous and Auditory   Chronic bilateral low back pain with bilateral sciatica    Continue follow-up with orthospine as scheduled.        Other   Anemia    We will repeat CBC pending results      Relevant Orders   CBC w/Diff   B12 and Folate Panel   Restless leg syndrome    Patient states this is acting up no longer stable. Is well controlled typically on ropinirole 1 g nightly.  Ordering B12 folate potassium and magnesium to see if there is any concern there.  Also recommended that patient discontinue rosuvastatin x1 week to see if there is improvement in the muscular pain if this is to occur and she does find relief then we will have to consider alternatives      Relevant Orders   Magnesium   Polyarthralgia    Work-up for ANA rheumatoid factor and sed rate pending.      Relevant Orders   ANA Screen,IFA,Reflex Titer/Pattern,Reflex Mplx 11 Ab Cascade with IdentRA   Sedimentation rate   Rheumatoid Factor   B12 and Folate Panel   Muscle weakness   Relevant Orders   CK   Magnesium   Hyperkalemia - Primary   Relevant Orders   Comprehensive metabolic panel   Unsteadiness on feet   Relevant Orders   B12 and Folate Panel   Acute leg pain, right    Venous Doppler ultrasound to rule out DVT or right knee Baker's cyst.  Pending results patient is scheduled from [facility].      Relevant Orders   US Venous Img Lower Unilateral Right (DVT)    No orders of the defined types were placed in this encounter.   Follow-up: Return if symptoms worsen or fail to improve.    Eugenia Pancoast, FNP

## 2021-06-17 ENCOUNTER — Other Ambulatory Visit (INDEPENDENT_AMBULATORY_CARE_PROVIDER_SITE_OTHER): Payer: Medicare Other

## 2021-06-17 ENCOUNTER — Other Ambulatory Visit: Payer: Self-pay

## 2021-06-17 ENCOUNTER — Other Ambulatory Visit: Payer: Self-pay | Admitting: Family

## 2021-06-17 DIAGNOSIS — E875 Hyperkalemia: Secondary | ICD-10-CM

## 2021-06-17 DIAGNOSIS — R7989 Other specified abnormal findings of blood chemistry: Secondary | ICD-10-CM

## 2021-06-17 NOTE — Progress Notes (Signed)
Your kidneys have spiked a bit. Do you have any urinary symptoms? Does pt see a nephrologist?   I am going to put in an order, schedule pt for lab only appt ASAP and have her drop off a urine and we will stat repeat her potassium bc it is elevated. Is she taking daily potassium?   Also her B12 is pretty low, does she take otc ? Let's set her up for b12 injections 1000 mcg IM once a month for three motnhs, then repeat b12 in three months for level.   Sed rate is elevated (inflammatory marker) pending ANA.   Send me back with responses (please ask and have her respond to all. thanks

## 2021-06-18 ENCOUNTER — Other Ambulatory Visit: Payer: Medicare Other

## 2021-06-18 ENCOUNTER — Telehealth: Payer: Self-pay

## 2021-06-18 LAB — POTASSIUM: Potassium: 4.8 mEq/L (ref 3.5–5.1)

## 2021-06-18 NOTE — Telephone Encounter (Signed)
Called and spoke to patient in regards to upcoming Covid test. Patient had a clear understanding.

## 2021-06-19 ENCOUNTER — Other Ambulatory Visit: Payer: Self-pay

## 2021-06-19 ENCOUNTER — Other Ambulatory Visit
Admission: RE | Admit: 2021-06-19 | Discharge: 2021-06-19 | Disposition: A | Discharge status: Home / Self Care | Payer: Medicare Other | Source: Ambulatory Visit | Attending: Pulmonary Disease | Admitting: Pulmonary Disease

## 2021-06-19 DIAGNOSIS — I480 Paroxysmal atrial fibrillation: Secondary | ICD-10-CM

## 2021-06-19 DIAGNOSIS — Z20822 Contact with and (suspected) exposure to covid-19: Secondary | ICD-10-CM | POA: Insufficient documentation

## 2021-06-19 DIAGNOSIS — M6281 Muscle weakness (generalized): Secondary | ICD-10-CM

## 2021-06-19 DIAGNOSIS — M792 Neuralgia and neuritis, unspecified: Secondary | ICD-10-CM

## 2021-06-19 DIAGNOSIS — M461 Sacroiliitis, not elsewhere classified: Secondary | ICD-10-CM

## 2021-06-19 DIAGNOSIS — I350 Nonrheumatic aortic (valve) stenosis: Secondary | ICD-10-CM

## 2021-06-19 DIAGNOSIS — L309 Dermatitis, unspecified: Secondary | ICD-10-CM

## 2021-06-19 DIAGNOSIS — Z87891 Personal history of nicotine dependence: Secondary | ICD-10-CM

## 2021-06-19 DIAGNOSIS — E663 Overweight: Secondary | ICD-10-CM

## 2021-06-19 DIAGNOSIS — R590 Localized enlarged lymph nodes: Secondary | ICD-10-CM

## 2021-06-19 DIAGNOSIS — Z96612 Presence of left artificial shoulder joint: Secondary | ICD-10-CM

## 2021-06-19 DIAGNOSIS — M5442 Lumbago with sciatica, left side: Secondary | ICD-10-CM

## 2021-06-19 DIAGNOSIS — M2550 Pain in unspecified joint: Secondary | ICD-10-CM

## 2021-06-19 DIAGNOSIS — N183 Chronic kidney disease, stage 3 unspecified: Secondary | ICD-10-CM

## 2021-06-19 DIAGNOSIS — R2681 Unsteadiness on feet: Secondary | ICD-10-CM

## 2021-06-19 DIAGNOSIS — G4733 Obstructive sleep apnea (adult) (pediatric): Secondary | ICD-10-CM

## 2021-06-19 DIAGNOSIS — U071 COVID-19: Secondary | ICD-10-CM

## 2021-06-19 DIAGNOSIS — M48061 Spinal stenosis, lumbar region without neurogenic claudication: Secondary | ICD-10-CM

## 2021-06-19 DIAGNOSIS — G894 Chronic pain syndrome: Secondary | ICD-10-CM

## 2021-06-19 DIAGNOSIS — Z9884 Bariatric surgery status: Secondary | ICD-10-CM

## 2021-06-19 DIAGNOSIS — Z7189 Other specified counseling: Secondary | ICD-10-CM

## 2021-06-19 DIAGNOSIS — G2581 Restless legs syndrome: Secondary | ICD-10-CM

## 2021-06-19 DIAGNOSIS — I10 Essential (primary) hypertension: Secondary | ICD-10-CM

## 2021-06-19 DIAGNOSIS — Z01812 Encounter for preprocedural laboratory examination: Secondary | ICD-10-CM | POA: Insufficient documentation

## 2021-06-19 DIAGNOSIS — M8589 Other specified disorders of bone density and structure, multiple sites: Secondary | ICD-10-CM

## 2021-06-19 DIAGNOSIS — K5790 Diverticulosis of intestine, part unspecified, without perforation or abscess without bleeding: Secondary | ICD-10-CM

## 2021-06-19 DIAGNOSIS — H532 Diplopia: Secondary | ICD-10-CM

## 2021-06-19 DIAGNOSIS — R208 Other disturbances of skin sensation: Secondary | ICD-10-CM

## 2021-06-19 DIAGNOSIS — C3412 Malignant neoplasm of upper lobe, left bronchus or lung: Secondary | ICD-10-CM

## 2021-06-19 DIAGNOSIS — K5732 Diverticulitis of large intestine without perforation or abscess without bleeding: Secondary | ICD-10-CM

## 2021-06-19 DIAGNOSIS — Z9181 History of falling: Secondary | ICD-10-CM

## 2021-06-19 DIAGNOSIS — G5791 Unspecified mononeuropathy of right lower limb: Secondary | ICD-10-CM

## 2021-06-19 DIAGNOSIS — G8929 Other chronic pain: Secondary | ICD-10-CM

## 2021-06-19 DIAGNOSIS — R42 Dizziness and giddiness: Secondary | ICD-10-CM

## 2021-06-19 DIAGNOSIS — E782 Mixed hyperlipidemia: Secondary | ICD-10-CM

## 2021-06-19 DIAGNOSIS — R29898 Other symptoms and signs involving the musculoskeletal system: Secondary | ICD-10-CM

## 2021-06-19 DIAGNOSIS — J4520 Mild intermittent asthma, uncomplicated: Secondary | ICD-10-CM

## 2021-06-19 DIAGNOSIS — E875 Hyperkalemia: Secondary | ICD-10-CM

## 2021-06-19 DIAGNOSIS — M79604 Pain in right leg: Secondary | ICD-10-CM

## 2021-06-19 LAB — URINALYSIS W MICROSCOPIC + REFLEX CULTURE
Bacteria, UA: NONE SEEN /HPF
Bilirubin Urine: NEGATIVE
Hgb urine dipstick: NEGATIVE
Hyaline Cast: NONE SEEN /LPF
Ketones, ur: NEGATIVE
Nitrites, Initial: NEGATIVE
Protein, ur: NEGATIVE
RBC / HPF: NONE SEEN /HPF (ref 0–2)
Specific Gravity, Urine: 1.025 (ref 1.001–1.035)
pH: 6 (ref 5.0–8.0)

## 2021-06-19 LAB — URINE CULTURE
MICRO NUMBER:: 12913580
SPECIMEN QUALITY:: ADEQUATE

## 2021-06-19 LAB — SARS CORONAVIRUS 2 (TAT 6-24 HRS): SARS Coronavirus 2: NEGATIVE

## 2021-06-19 LAB — CULTURE INDICATED

## 2021-06-19 NOTE — Progress Notes (Signed)
Attempted to call patient but no voicemail was set up.  We will attempt to reach her via MyChart.  Thankfully her potassium repeat was back to normal limits which I am glad to hear that the urine did come back and it was negative for bacterial infection.  My concerns are that she had a increase in her creatinine and a drop in her EGFR, which could potentially indicate some stress on the kidneys.  I wanted to discuss with her in further detail why this may be occurring.

## 2021-06-20 ENCOUNTER — Other Ambulatory Visit: Payer: Self-pay | Admitting: Family

## 2021-06-20 ENCOUNTER — Other Ambulatory Visit: Payer: Self-pay

## 2021-06-20 ENCOUNTER — Ambulatory Visit: Payer: Medicare Other | Attending: Pulmonary Disease

## 2021-06-20 ENCOUNTER — Ambulatory Visit (INDEPENDENT_AMBULATORY_CARE_PROVIDER_SITE_OTHER): Payer: Medicare Other

## 2021-06-20 ENCOUNTER — Ambulatory Visit (INDEPENDENT_AMBULATORY_CARE_PROVIDER_SITE_OTHER): Payer: Medicare Other | Admitting: Orthopedic Surgery

## 2021-06-20 DIAGNOSIS — R059 Cough, unspecified: Secondary | ICD-10-CM | POA: Insufficient documentation

## 2021-06-20 DIAGNOSIS — R0602 Shortness of breath: Secondary | ICD-10-CM | POA: Insufficient documentation

## 2021-06-20 DIAGNOSIS — G8929 Other chronic pain: Secondary | ICD-10-CM

## 2021-06-20 DIAGNOSIS — Z87891 Personal history of nicotine dependence: Secondary | ICD-10-CM | POA: Insufficient documentation

## 2021-06-20 DIAGNOSIS — M25561 Pain in right knee: Secondary | ICD-10-CM

## 2021-06-20 DIAGNOSIS — Z7951 Long term (current) use of inhaled steroids: Secondary | ICD-10-CM | POA: Diagnosis not present

## 2021-06-20 DIAGNOSIS — N289 Disorder of kidney and ureter, unspecified: Secondary | ICD-10-CM

## 2021-06-20 DIAGNOSIS — R7989 Other specified abnormal findings of blood chemistry: Secondary | ICD-10-CM

## 2021-06-20 MED ORDER — ALBUTEROL SULFATE (2.5 MG/3ML) 0.083% IN NEBU
2.5000 mg | INHALATION_SOLUTION | Freq: Once | RESPIRATORY_TRACT | Status: AC
  Administered 2021-06-20: 2.5 mg via RESPIRATORY_TRACT
  Filled 2021-06-20: qty 3

## 2021-06-20 NOTE — Progress Notes (Signed)
Was able to get in touch with patient and inform her of her kidney function.  She states she is not having any trouble with urinating no urinary frequency or urgency.  She also denies flank pain.  She does have incontinence but this is chronic.  She does drink only tea throughout the day as she states she does not like water.  I have advised her that this can dehydrate her and may be the reason for the sudden jump in her kidney status.  She is going to increase her water intake over the weekend and repeat BMP with EGFR on Monday.  She is already scheduled for a lab only appointment.

## 2021-06-21 ENCOUNTER — Encounter: Payer: Self-pay | Admitting: Orthopedic Surgery

## 2021-06-21 LAB — ANA SCREEN,IFA,REFLEX TITER/PATTERN,REFLEX MPLX 11 AB CASCADE
14-3-3 eta Protein: <0.2 ng/mL (ref <0.2)
Anti Nuclear Antibody (ANA): NEGATIVE
Cyclic Citrullin Peptide Ab: <16 UNITS
Rheumatoid fact SerPl-aCnc: <14 IU/mL (ref <14)

## 2021-06-23 ENCOUNTER — Other Ambulatory Visit (INDEPENDENT_AMBULATORY_CARE_PROVIDER_SITE_OTHER): Payer: Medicare Other

## 2021-06-23 ENCOUNTER — Other Ambulatory Visit: Payer: Self-pay

## 2021-06-23 DIAGNOSIS — N289 Disorder of kidney and ureter, unspecified: Secondary | ICD-10-CM

## 2021-06-23 DIAGNOSIS — R7989 Other specified abnormal findings of blood chemistry: Secondary | ICD-10-CM

## 2021-06-23 LAB — BASIC METABOLIC PANEL WITH GFR
BUN/Creatinine Ratio: 17 (calc) (ref 6–22)
BUN: 31 mg/dL — ABNORMAL HIGH (ref 7–25)
CO2: 24 mmol/L (ref 20–32)
Calcium: 9 mg/dL (ref 8.6–10.4)
Chloride: 101 mmol/L (ref 98–110)
Creat: 1.84 mg/dL — ABNORMAL HIGH (ref 0.60–0.95)
Glucose, Bld: 248 mg/dL — ABNORMAL HIGH (ref 65–99)
Potassium: 5 mmol/L (ref 3.5–5.3)
Sodium: 133 mmol/L — ABNORMAL LOW (ref 135–146)
eGFR: 27 mL/min/{1.73_m2} — ABNORMAL LOW (ref >=60)

## 2021-06-24 ENCOUNTER — Other Ambulatory Visit: Payer: Self-pay | Admitting: Family

## 2021-06-24 ENCOUNTER — Other Ambulatory Visit: Payer: Self-pay | Admitting: Family Medicine

## 2021-06-24 DIAGNOSIS — R4583 Excessive crying of child, adolescent or adult: Secondary | ICD-10-CM

## 2021-06-24 DIAGNOSIS — N184 Chronic kidney disease, stage 4 (severe): Secondary | ICD-10-CM | POA: Insufficient documentation

## 2021-06-24 NOTE — Progress Notes (Signed)
Office Visit Note   Patient: Natasha [name]           Date of Birth: 1941/04/10           MRN: [id number] Visit Date: 06/20/2021 Requested by: Eugenia Pancoast, [place],  [place] [address] PCP: Lesleigh Noe, MD  Subjective: Chief Complaint  Patient presents with   Right Knee - Pain    HPI: Natasha [name] is a patient with right knee pain for several weeks duration.  No known injury.  She states that some other joint started hurting her as well so she stopped her cholesterol medication but her right knee pain persisted.  Reports pain around the tibial region.  Very episodic.  She does take calcium.  Occasionally wakes her from sleep.  Denies much in the way of groin pain or back pain.  She does however describe a history of compression fractures in her back as well as stenosis but has not had any surgery.              ROS: All systems reviewed are negative as they relate to the chief complaint within the history of present illness.  Patient denies  fevers or chills.   Assessment & Plan: Visit Diagnoses:  1. Chronic pain of right knee     Plan: Impression is right knee pain with fairly normal exam and radiographs.  Could be a stress reaction in the proximal tibia versus referred pain from the back.  Plan MRI right knee to evaluate stress reaction.  If that is negative I think it is likely this is referred pain from her back.  We will see her back after the MRI scan.  Follow-Up Instructions: Return for after MRI.   Orders:  Orders Placed This Encounter  Procedures   XR Knee 1-2 Views Right   No orders of the defined types were placed in this encounter.     Procedures: No procedures performed   Clinical Data: No additional findings.  Objective: Vital Signs: There were no vitals taken for this visit.  Physical Exam:   Constitutional: Patient appears well-developed HEENT:  Head: Normocephalic Eyes:EOM are normal Neck: Normal range of  motion Cardiovascular: Normal rate Pulmonary/chest: Effort normal Neurologic: Patient is alert Skin: Skin is warm Psychiatric: Patient has normal mood and affect   Ortho Exam: Ortho exam demonstrates no nerve root tension signs with negative clonus negative Babinski.  Pedal pulses palpable.  Ankle dorsiflexion plantarflexion strength intact quad and hamstring strength intact.  Right knee has full range of motion with no effusion.  Collateral and cruciate ligaments are stable.  Does have tenderness to palpation around the proximal medial and lateral tibial plateau region.  No groin pain with internal and external rotation of the right leg.  Extensor mechanism intact.  Specialty Comments:  No specialty comments available.  Imaging: No results found.   PMFS History: Patient Active Problem List   Diagnosis Date Noted   Polyarthralgia 06/16/2021   Muscle weakness 06/16/2021   Hyperkalemia 06/16/2021   Unsteadiness on feet 06/16/2021   Acute leg pain, right 06/16/2021   Double vision 03/13/2021   Dizziness 03/13/2021   COVID-19 virus infection 02/24/2021   Atrial flutter ([place]) 02/24/2021   Chronic bilateral low back pain with bilateral sciatica 02/17/2021   Decreased sensation 11/27/2020   Dermatitis 11/27/2020   Axillary lymphadenopathy 10/11/2020   Cancer of upper lobe of left lung ([place]) 10/11/2020   Goals of care, counseling/discussion 10/11/2020  Status post reverse total shoulder replacement, 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   Diverticulitis of colon 02/03/2020   Chronic pain syndrome 01/12/2019   Neuropathic pain 01/12/2019   At high risk for falls 11/02/2018   Neuroforaminal stenosis of lumbar spine 11/02/2018   Sacroiliitis ([place]) 11/02/2018   Former smoker 09/23/2018   Spinal stenosis of lumbar region without neurogenic claudication 09/23/2018   Weakness of both hands 09/23/2018   Overweight (BMI 25.0-29.9) 08/10/2018   Insomnia 06/17/2018   Chronic [name] disease with symptom  management only, stage 3 (moderate) ([place]) 05/27/2018   Nonrheumatic aortic valve stenosis 05/24/2018   Anemia 12/24/2017   Paroxysmal atrial fibrillation ([place]) 12/24/2017   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/24/2017   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/2017   Gastroesophageal reflux disease 11/17/2016   OSA (obstructive sleep apnea) 07/24/2016   Diverticulosis 07/21/2016   Osteopenia of multiple sites 06/04/2016   Closed compression fracture of thoracic vertebra ([place]) 04/02/2015   Mixed hyperlipidemia 04/02/2015   Restless leg syndrome 04/02/2015   Closed fracture of lateral portion of left tibial plateau 04/06/2014   Essential hypertension 04/06/2014   Mild intermittent asthma without complication 38/93/7342   Type 2 diabetes mellitus with other specified complication ([place]) 87/68/1157   Past Medical History:  Diagnosis Date   A-fib ([place])    a.) CHA2DS2-VASc Score = 6 (age x 2, sex, HTN, aortic plaque, T2DM). b.) rate/rhythm maintained on oral amiodarone + metoprolol succinate; chronically anticoagulated with full dose apixaban   Anemia    Angiomyolipoma of left [name] 04/10/2021   Aortic atherosclerosis (HCC)    Atrial flutter with rapid ventricular response ([place]) 02/24/2021   Chronic cough    CKD (chronic [name] disease), stage III (HCC)    Diastolic dysfunction    a.) TTE 04/08/2014: EF 60%; mild concentric LVH; G2DD. b.) TTE 12/21/2019 and 02/25/2021: EF 60-65%; no RWMAs; mild LA dilitation; mild-mod MR; G1DD.   Diastolic dysfunction    a.) TTE 12/21/2019: ED 60-65%, LA mild dilated, mild-mod MR; PASP 36.8; G1DD. b.) TTE 02/25/2021: normal LV function with mild concentric LVH; G1DD.   Diverticulitis    High cholesterol    History of hiatal hernia    Hypertension    Insomnia    Long term current use of anticoagulant    a.) apixaban   Mild asthma    Murmur    Non-small cell carcinoma of left lung, stage 1 ([place]) 09/30/2020   a.) clinical stage 1 (cT1cN0cM0). b.) treated  with SBRT (60 cGy over 5 fractions).   OSA on CPAP    Osteoporosis    Restless leg    Sepsis ([place])    T2DM (type 2 diabetes mellitus) ([place])     Family History  Problem Relation Age of Onset   Other Mother        died from surgery   AAA (abdominal aortic aneurysm) Mother    Diabetes Father        controlled by diet   Dementia Father        brain atrophy - unknown origin   Breast cancer Cousin        maternal    Past Surgical History:  Procedure Laterality Date   ABDOMINAL HYSTERECTOMY  1978   APPENDECTOMY  1978   BREAST BIOPSY Left ?   papilloma   BREAST CYST EXCISION Bilateral yrs ago   benign, scars not well visualized   BREAST SURGERY     CARPAL TUNNEL RELEASE Left 04/22/2021   Procedure: Left carpal tunnel  release & ulnar nerve release at elbow;  Surgeon: Hessie Knows, MD;  Location: [facility] OR[facility];  Service: Orthopedics;  Laterality: Left;   CATARACT EXTRACTION W/ INTRAOCULAR LENS  IMPLANT, BILATERAL Bilateral    CHOLECYSTECTOMY     COLONOSCOPY     ELBOW SURGERY Right    Bosworth release   EYE SURGERY     FRACTURE SURGERY     GASTRIC BYPASS  12/22/2017   Roux-N-Y   JOINT REPLACEMENT     KNEE SURGERY Left    tibial fracture with metal plate   TOTAL SHOULDER REPLACEMENT Left 2014   ULNAR TUNNEL RELEASE Left 04/22/2021   Procedure: CUBITAL TUNNEL RELEASE;  Surgeon: Hessie Knows, MD;  Location: [facility];  Service: Orthopedics;  Laterality: Left;   VIDEO BRONCHOSCOPY WITH ENDOBRONCHIAL NAVIGATION N/A 09/30/2020   Procedure: ROBOTIC ASSISTED VIDEO BRONCHOSCOPY WITH ENDOBRONCHIAL NAVIGATION;  Surgeon: Tyler Pita, MD;  Location: [facility];  Service: Pulmonary;  Laterality: N/A;   WRIST SURGERY Left    fractures   Social History   Occupational History   Not on file  Tobacco Use   Smoking status: Former    Packs/day: 1.00    Years: 12.00    Pack years: 12.00    Types: Cigarettes    Quit date: 05/26/1975    Years since quitting: 46.1   Smokeless tobacco: Never   Vaping Use   Vaping Use: Never used  Substance and Sexual Activity   Alcohol use: No    Comment: rarely   Drug use: Never   Sexual activity: Not Currently

## 2021-06-24 NOTE — Progress Notes (Signed)
Continue with oral rehydration with water as tolerated as kidney function improved, but slightly. Still in stage 4, referring to nephrology so that they can monitor as well. Referral placed, let us know if you do not hear anything back in the next one week in regards ot this referral.

## 2021-06-25 ENCOUNTER — Other Ambulatory Visit: Payer: Self-pay

## 2021-06-25 ENCOUNTER — Ambulatory Visit (INDEPENDENT_AMBULATORY_CARE_PROVIDER_SITE_OTHER): Payer: Medicare Other

## 2021-06-25 DIAGNOSIS — E538 Deficiency of other specified B group vitamins: Secondary | ICD-10-CM | POA: Diagnosis not present

## 2021-06-25 MED ORDER — CYANOCOBALAMIN 1000 MCG/ML IJ SOLN
1000.0000 ug | Freq: Once | INTRAMUSCULAR | Status: AC
  Administered 2021-06-25: 1000 ug via INTRAMUSCULAR

## 2021-06-25 NOTE — Progress Notes (Signed)
Per orders of Dr. Danise Mina in leu of Dr. Verda Cumins absence, an injection of B12 was given by Ophelia Shoulder, CMA. This is shot 1 of 3 that was given in the left deltoid. Patient tolerated injection well.

## 2021-06-27 ENCOUNTER — Ambulatory Visit
Admission: RE | Admit: 2021-06-27 | Discharge: 2021-06-27 | Disposition: A | Discharge status: Home / Self Care | Payer: Medicare Other | Source: Ambulatory Visit | Attending: Orthopedic Surgery | Admitting: Orthopedic Surgery

## 2021-06-27 ENCOUNTER — Other Ambulatory Visit: Payer: Self-pay

## 2021-06-27 DIAGNOSIS — G8929 Other chronic pain: Secondary | ICD-10-CM

## 2021-06-29 ENCOUNTER — Encounter: Payer: Self-pay | Admitting: Family

## 2021-07-02 ENCOUNTER — Other Ambulatory Visit: Payer: Self-pay | Admitting: Orthopedic Surgery

## 2021-07-09 ENCOUNTER — Other Ambulatory Visit
Admission: RE | Admit: 2021-07-09 | Discharge: 2021-07-09 | Disposition: A | Discharge status: Home / Self Care | Payer: Medicare Other | Source: Ambulatory Visit | Attending: Orthopedic Surgery | Admitting: Orthopedic Surgery

## 2021-07-09 ENCOUNTER — Other Ambulatory Visit: Payer: Self-pay

## 2021-07-09 ENCOUNTER — Telehealth: Payer: Self-pay | Admitting: *Deleted

## 2021-07-09 ENCOUNTER — Ambulatory Visit (INDEPENDENT_AMBULATORY_CARE_PROVIDER_SITE_OTHER): Payer: Medicare Other | Admitting: Orthopedic Surgery

## 2021-07-09 DIAGNOSIS — M25561 Pain in right knee: Secondary | ICD-10-CM

## 2021-07-09 DIAGNOSIS — G8929 Other chronic pain: Secondary | ICD-10-CM | POA: Diagnosis not present

## 2021-07-09 HISTORY — DX: Depression, unspecified: F32.A

## 2021-07-09 HISTORY — DX: Unspecified osteoarthritis, unspecified site: M19.90

## 2021-07-09 HISTORY — DX: Dyspnea, unspecified: R06.00

## 2021-07-09 HISTORY — DX: Chronic obstructive pulmonary disease, unspecified: J44.9

## 2021-07-09 HISTORY — DX: Personal history of urinary calculi: Z87.442

## 2021-07-09 HISTORY — DX: Other complications of anesthesia, initial encounter: T88.59XA

## 2021-07-09 NOTE — Patient Instructions (Addendum)
Your procedure is scheduled on: 07/15/21 - Tuesday Report to the Registration Desk on the 1st floor of the [facility]. To find out your arrival time, please call [phone] between 1PM - 3PM on: 07/14/21 - Monday  REMEMBER: Instructions that are not followed completely may result in serious medical risk, up to and including death; or upon the discretion of your surgeon and anesthesiologist your surgery may need to be rescheduled.  Do not eat food after midnight the night before surgery.  No gum chewing, lozengers or hard candies.  You may however, drink CLEAR liquids up to 2 hours before you are scheduled to arrive for your surgery. Do not drink anything within 2 hours of your scheduled arrival time.  Type 1 and Type 2 diabetics should only drink water.  TAKE THESE MEDICATIONS THE MORNING OF SURGERY WITH A SIP OF WATER:  - amiodarone (PACERONE) 200 MG tablet - metoprolol succinate (TOPROL-XL) 25 MG 24 hr tablet  Use levalbuterol (XOPENEX HFA) 45 MCG/ACT inhaler on the day of surgery and bring to the hospital.  - Do Not take JANUMET 50-1000 MG tablet beginning 02/19, you may resume taking the day after surgery.  - Do not take JARDIANCE 25 MG TABS tablet beginning 02/18, may resume the day after surgery.  - TRESIBA FLEXTOUCH 100 UNIT/ML SOPN FlexTouch Pen -  inject only 1/2 of the scheduled dose the night before surgery.  Follow recommendations from Cardiologist, Pulmonologist or PCP regarding stopping Aspirin, Coumadin, Plavix, Eliquis, Pradaxa, or Pletal. Do Not take Eliquis the  Day before surgery, 02/20, may resume the evening dose on the day after surgery.  One week prior to surgery: Stop Anti-inflammatories (NSAIDS) such as Advil, Aleve, Ibuprofen, Motrin, Naproxen, Naprosyn and Aspirin based products such as Excedrin, Goodys Powder, BC Powder.  Stop ANY OVER THE COUNTER supplements until after surgery. Calcium Carb-Cholecalciferol (CALCIUM 600+D3 PO)  You may take  Tylenol if needed for pain up until the day of surgery.  No Alcohol for 24 hours before or after surgery.  No Smoking including e-cigarettes for 24 hours prior to surgery.  No chewable tobacco products for at least 6 hours prior to surgery.  No nicotine patches on the day of surgery.  Do not use any "recreational" drugs for at least a week prior to your surgery.  Please be advised that the combination of cocaine and anesthesia may have negative outcomes, up to and including death. If you test positive for cocaine, your surgery will be cancelled.  On the morning of surgery brush your teeth with toothpaste and water, you may rinse your mouth with mouthwash if you wish. Do not swallow any toothpaste or mouthwash.  Do not wear jewelry, make-up, hairpins, clips or nail polish.  Do not wear lotions, powders, or perfumes.   Do not shave body from the neck down 48 hours prior to surgery just in case you cut yourself which could leave a site for infection.  Also, freshly shaved skin may become irritated if using the CHG soap.  Contact lenses, hearing aids and dentures may not be worn into surgery.  Do not bring valuables to the hospital. [facility] is not responsible for any missing/lost belongings or valuables.   Bring your C-PAP to the hospital with you in case you may have to spend the night.   Notify your doctor if there is any change in your medical condition (cold, fever, infection).  Wear comfortable clothing (specific to your surgery type) to the hospital.  After  surgery, you can help prevent lung complications by doing breathing exercises.  Take deep breaths and cough every 1-2 hours. Your doctor may order a device called an Incentive Spirometer to help you take deep breaths. When coughing or sneezing, hold a pillow firmly against your incision with both hands. This is called splinting. Doing this helps protect your incision. It also decreases belly discomfort.  If you are  being admitted to the hospital overnight, leave your suitcase in the car. After surgery it may be brought to your room.  If you are being discharged the day of surgery, you will not be allowed to drive home. You will need a responsible adult (18 years or older) to drive you home and stay with you that night.   If you are taking public transportation, you will need to have a responsible adult (18 years or older) with you. Please confirm with your physician that it is acceptable to use public transportation.   Please call the [facility] at [phone] if you have any questions about these instructions.  Surgery Visitation Policy:  Patients undergoing a surgery or procedure may have one family member or support person with them as long as that person is not COVID-19 positive or experiencing its symptoms.  That person may remain in the waiting area during the procedure and may rotate out with other people.  Inpatient Visitation:    Visiting hours are 7 a.m. to 8 p.m. Up to two visitors ages 16+ are allowed at one time in a patient room. The visitors may rotate out with other people during the day. Visitors must check out when they leave, or other visitors will not be allowed. One designated support person may remain overnight. The visitor must pass COVID-19 screenings, use hand sanitizer when entering and exiting the patients room and wear a mask at all times, including in the patients room. Patients must also wear a mask when staff or their visitor are in the room. Masking is required regardless of vaccination status.

## 2021-07-09 NOTE — Telephone Encounter (Signed)
I s/w the pt and she did confirm that she is now seeing Dr. Chancy Milroy as her primary cardiologist and is not following up with [facility]. I assured the pt that I will update the surgeon's office that they will need to reach out to Dr. Marella Bile office for clearance.

## 2021-07-09 NOTE — Telephone Encounter (Signed)
I tried to call pt, however there seemed to be some sort if interference in the phone line. I had stated my name and that I was calling from [facility] and asked if they could hear me on the phone, person answered yes, but then the phone hung up. I tried to call back but line was busy.

## 2021-07-09 NOTE — Telephone Encounter (Signed)
° °  Name: Natasha [name]  DOB: 10/21/1940  MRN: [id number]  Primary Cardiologist: Kate Sable, MD previously, but more recently seen by separate practice Dr. Neoma Laming in 02/2021  Chart reviewed as part of pre-operative protocol coverage. Because of [name] past medical history and time since last visit, she will require a follow-up visit in order to better assess preoperative cardiovascular risk.  Patient was last seen by [facility], Dr Garen Lah, over 1 year ago in 05/2020, therefore requires OV for cardiac clearance. However, I do see where she had a more recent cardiac consultation by a different cardiology group, Dr. Neoma Laming, in 02/2021 so need to determine from patient who she identifies as her primary cardiologist. If she would like for [place] to follow her, will need OV for clearance.  Pre-op covering staff: - Please schedule appointment and call patient to inform them. - Please contact requesting surgeon's office via preferred method (i.e, phone, fax) to inform them of need for appointment prior to surgery. - CAROL - if patient clarifies that she wants to come back to [facility] instead, would recommend routing back to pharm pool so they can review her clearance - it looks like a similar clearance was provided 03/2021 but just needs updated review.  Charlie Pitter, PA-C  07/09/2021, 12:20 PM

## 2021-07-09 NOTE — Telephone Encounter (Signed)
Request for pre-operative cardiac clearance Received: Today Natasha Kitchens, NP  P Cv Div Preop Callback Request for pre-operative cardiac clearance:     1. What type of surgery is being performed?  RIGHT CARPAL TUNNEL RELEASE; CUBITAL TUNNEL RELEASE   2. When is this surgery scheduled?  07/15/2021     3. Type of clearance being requested (medical, pharmacy, both).  BOTH     4. Are there any medications that need to be held prior to surgery?  APIXABAN   5. Practice name and name of physician performing surgery?  Performing surgeon: Dr. Hessie Knows, MD  Requesting clearance: Honor Loh, FNP-C       6. Anesthesia type (none, local, MAC, general)?  GENERAL   7. What is the office phone and fax number?    Phone: [phone]  Fax: [phone]   ATTENTION: Unable to create telephone message as per your standard workflow. Directed by [facility] providers to send requests for cardiac clearance to this pool for appropriate distribution to provider covering pre-operative clearances.   Honor Loh, MSN, APRN, FNP-C, CEN  [facility]  Peri-operative Services Nurse Practitioner  Phone: [phone]  07/09/21 11:59 AM

## 2021-07-10 ENCOUNTER — Telehealth: Payer: Self-pay

## 2021-07-10 ENCOUNTER — Encounter: Payer: Self-pay | Admitting: Orthopedic Surgery

## 2021-07-10 LAB — VITAMIN D 25 HYDROXY (VIT D DEFICIENCY, FRACTURES): Vit D, 25-Hydroxy: 22 ng/mL — ABNORMAL LOW (ref 30–100)

## 2021-07-10 NOTE — Progress Notes (Signed)
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 Clinical nursing staff has updated patient's PMH/PSHx, current medication list, and drug allergies/intolerances to ensure comprehensive history available to assist in medical decision making as it pertains to the aforementioned surgical procedure and anticipated anesthetic course. Extensive review of available clinical information performed. [place] PMH and PSHx updated with any diagnoses/procedures that  may have been inadvertently omitted during her intake with the pre-admission testing department's nursing staff.  Clinical Discussion:  Natasha [name] is a 81 y.o. female who is submitted for pre-surgical anesthesia review and clearance prior to her undergoing the above procedure. Patient is a Former Smoker (24 pack years; quit 05/1975). Pertinent PMH includes: atrial fibrillation/flutter, mild aortic valve stenosis, cardiac murmur, diastolic dysfunction, aortic atherosclerosis, HTN, HLD, T2DM, CKD-III, asthma, NSCLC, OSAH (requires nocturnal PAP therapy), anemia, lumbar spinal stenosis, insomnia, depression.    Patient is followed by cardiology Humphrey Rolls, MD).  Patient was last seen in the cardiology clinic on 05/30/2021; notes reviewed.  At the time of her clinic visit, patient doing well overall from a cardiovascular perspective.  She denied any episodes of chest pain,  short of breath, PND, orthopnea, palpitations, significant peripheral edema, vertiginous symptoms, or presyncope/syncope.  Patient with a chronic cough with no associated wheezing.  Cough reported to be productive in nature. Patient with a past medical history significant for cardiovascular diagnoses.  TTE performed on 12/21/2019 revealed a normal left ventricular systolic function with an EF of 60 to 65%.  Diastolic Doppler parameters consistent with impaired relaxation (G1DD).  PASP mildly elevated at 36.8 mmHg.  Left atrium mildly dilated.  There was mild to moderate mitral valve regurgitation.  There was no evidence of a significant transvalvular gradient to suggest significant stenosis.  Admitted to [facility] on 02/24/2021 for fever, chills, SOB. She was found to be in atrial flutter with RVR at a rate of 146 bpm. SARS-CoV-2 test was (+).  Repeat TTE performed on 02/25/2021 remain grossly unchanged with preserved LV function; EF 60-65%.  Doppler parameters continue to be consistent with impaired relaxation (G1DD).  There was no evidence of valvular stenosis.  Myocardial perfusion imaging study performed on 03/27/2021 revealed a normal left ventricular systolic function with an EF of 86%.  There were no regional wall motion abnormalities.  There were no significant ST changes at peak exercise.  EKG showed normal sinus rhythm at a rate of 93 bpm.  There was no evidence of stress-induced myocardial ischemia or arrhythmia.  Study determined to be normal and low risk.  PMH significant for a atrial fibrillation; CHA2DS2-VASc Score = 6 (age x 2, sex, HTN, aortic plaque, T2DM).  Rate and rhythm maintained on daily oral amiodarone + metoprolol succinate.  She is chronically anticoagulated on full dose apixaban; compliant with therapy with no evidence or reports of GI bleeding. Blood pressure well controlled at  128/60 on currently prescribed CCB and beta-blocker therapies.  Patient is on a statin for her HLD. T2DM  uncontrolled on currently prescribed regimen; last Hgb was 8.4 when checked on 05/05/2021. She is on a SGLT2 for her T2DM in the setting of known diastolic dysfunction. Functional capacity, as defined by DASI, is reported to be >/= 4 METS.  No recent changes were made to patient's medication regimen by cardiology per her report. Patient to follow-up with outpatient cardiology in 3 months or sooner if needed.  Natasha [name] is scheduled for an elective RIGHT CARPAL TUNNEL RELEASE/CUBITAL TUNNEL RELEASE on 07/15/2021 with Dr. Hessie Knows, MD.  Given patient's past medical history significant for cardiovascular diagnoses, presurgical cardiac clearance was sought by the PAT team. Per cardiology, "this patient is optimized for surgery and may proceed with the planned procedural course with a MODERATE risk of significant perioperative cardiovascular complications". Again, this patient is on daily anticoagulation therapy. She was instructed to hold her apixaban on the day before and day of her procedure. Plans are to restart anticoagulation therapy as soon as postoperative bleeding risk felt to be minimized by her primary attending surgeon.  Patient denies previous perioperative complications with anesthesia in the past. In review of the available records, it is noted that patient underwent a general anesthetic course here (ASA III) in 03/2021 without documented complications.   Vitals with BMI 06/16/2021 06/12/2021 05/05/2021  Height 5' 4" 5' 4" -  Weight 141 lbs 143 lbs 13 oz -  BMI 22.44 97.53 -  Systolic 005 110 211  Diastolic 74 66 67  Pulse 71 81 64    Providers/Specialists:   NOTE: Primary physician provider listed below. Patient may have been seen by APP or partner within same practice.   PROVIDER ROLE / SPECIALTY LAST Fabio Bering, MD Orthopedics (Surgeon) 06/30/2021  Lesleigh Noe, MD Primary Care Provider 06/16/2021  Neoma Laming, MD Cardiology 05/30/2021  Mee Hives, MD  Endocrinology 05/05/2021  Noreene Filbert, MD Radiation Oncology 04/14/2021  Gurney Maxin, MD Neurology 06/17/2021   Allergies:  Atorvastatin, Lantus [insulin glargine], Lyrica [pregabalin], and Lisinopril  Current Home Medications:   No current facility-administered medications for this encounter.    amiodarone (PACERONE) 200 MG tablet   apixaban (ELIQUIS) 5 MG TABS tablet   Calcium Carb-Cholecalciferol (CALCIUM 600+D3 PO)   Ferrous Gluconate (IRON 27 PO)   HYDROcodone-acetaminophen (NORCO) 5-325 MG tablet   JANUMET 50-1000 MG tablet   JARDIANCE 25 MG TABS tablet   levalbuterol (XOPENEX HFA) 45 MCG/ACT inhaler   metoprolol succinate (TOPROL-XL) 25 MG 24 hr tablet   pramipexole (MIRAPEX) 1 MG tablet   rosuvastatin (CRESTOR) 20 MG tablet   sertraline (ZOLOFT) 50 MG tablet   spironolactone (ALDACTONE) 25 MG tablet   TRESIBA FLEXTOUCH 100 UNIT/ML SOPN FlexTouch Pen   Budeson-Glycopyrrol-Formoterol (BREZTRI AEROSPHERE) 160-9-4.8 MCG/ACT AERO   glucose blood (CONTOUR NEXT TEST) test strip   Vitamin D, Ergocalciferol, (DRISDOL) 1.25 MG (50000 UNIT) CAPS capsule   History:   Past Medical History:  Diagnosis Date   A-fib ([place])    a.) CHA2DS2-VASc Score = 6 (age x 2, sex, HTN, aortic plaque, T2DM). 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   Chronic cough    CKD (chronic kidney disease), stage III (HCC)    Complication of anesthesia    COPD (chronic obstructive pulmonary disease) (HCC)    Depression    Diastolic dysfunction    a.) TTE 04/08/2014: EF 60%; mild concentric LVH; G2DD. b.) TTE 12/21/2019: EF 60-65%, LA mildly dilated,  mild-mod MR; PASP 36.8; G1DD. c.) TTE 02/25/2021: EF 60-65%; normal LV function with mild concentric LVH; G1DD   Diverticulitis    Dyspnea    High cholesterol    History of 2019 novel coronavirus disease (COVID-19) 02/24/2021   History of hiatal hernia    History of kidney stones    Hypertension    Insomnia    Long term current use of anticoagulant    a.) apixaban   Lumbar spinal stenosis    Mild asthma    Murmur    Non-small cell carcinoma of left lung, stage 1 ([place]) 09/30/2020   a.) clinical stage 1 (cT1cN0cM0). b.) treated with SBRT (60 cGy over 5 fractions).   OSA on CPAP    Osteoporosis    Restless leg    Sepsis ([place])    T2DM (type 2 diabetes mellitus) ([place])    Past Surgical History:  Procedure Laterality Date   ABDOMINAL HYSTERECTOMY  1978   APPENDECTOMY  1978   BREAST BIOPSY Left ?   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   JOINT REPLACEMENT     KNEE SURGERY Left    tibial fracture with metal plate   TOTAL SHOULDER REPLACEMENT Left 2014   ULNAR TUNNEL RELEASE Left 04/22/2021   Procedure: CUBITAL TUNNEL RELEASE;  Surgeon: Hessie Knows, MD;  Location: [facility] ORS;  Service: Orthopedics;  Laterality: Left;   VIDEO BRONCHOSCOPY WITH ENDOBRONCHIAL NAVIGATION N/A 09/30/2020   Procedure: ROBOTIC ASSISTED VIDEO BRONCHOSCOPY WITH ENDOBRONCHIAL NAVIGATION;  Surgeon: Tyler Pita, MD;  Location: [facility] ORS;  Service: Pulmonary;  Laterality: N/A;   WRIST SURGERY Left    fractures    Family History  Problem Relation Age of Onset   Other Mother        died from surgery   AAA (abdominal aortic aneurysm) Mother    Diabetes Father        controlled by diet   Dementia Father        brain atrophy - unknown origin   Breast cancer Cousin        maternal   Social History   Tobacco Use   Smoking status: Former    Packs/day: 1.00    Years: 12.00    Pack years: 12.00    Types: Cigarettes    Quit date: 05/26/1975    Years since quitting: 46.1   Smokeless tobacco: Never  Vaping Use   Vaping Use: Never used  Substance Use Topics   Alcohol use: No    Comment: rarely  Drug use: Never    Pertinent Clinical Results:  LABS: Labs reviewed: Acceptable for surgery.  Lab Results  Component Value Date   WBC 8.6 06/16/2021   HGB 10.8 (L) 06/16/2021   HCT 33.6 (L) 06/16/2021   MCV 88.3 06/16/2021   PLT 485.0 (H) 06/16/2021   Lab Results  Component Value Date   NA 133 (L) 06/23/2021   K 5.0 06/23/2021   CO2 24 06/23/2021   GLUCOSE 248 (H) 06/23/2021   BUN 31 (H) 06/23/2021   CREATININE 1.84 (H) 06/23/2021   CALCIUM 9.0 06/23/2021   EGFR 27 (L) 06/23/2021   GFRNONAA 33 (L) 03/09/2021   Lab Results  Component Value Date   HGBA1C 9.4 (H) 02/24/2021    ECG: Date: 03/09/2021 Time ECG obtained: 1656 PM Rate: 88 bpm Rhythm: normal sinus Axis (leads I and aVF): Normal Intervals: PR 160 ms. QRS 88 ms. QTc 459 ms. ST segment and T wave changes: Nonspecific ST and T wave abnormality  Comparison: Tracing performed on 02/24/2021 showed atrial flutter with variable AV block. Nonspecific ST and T wave abnormality present.   IMAGING / PROCEDURES: MYOCARDIAL PERFUSION IMAGING STUDY (LEXISCAN) performed on 03/27/2021 LVEF 86% No significant ST changes at peak exercise EKG showed normal sinus rhythm at a rate of 93 bpm No evidence of stress-induced myocardial ischemia or arrhythmia Study determined to be normal and low risk   TRANSTHORACIC ECHOCARDIOGRAM  performed on 02/25/2021 Left ventricular ejection fraction, by estimation, is 60 to 65%. The left ventricle has normal function. The left ventricle has no regional wall motion abnormalities. There is mild concentric left ventricular  hypertrophy. Left ventricular diastolic parameters are consistent with Grade I diastolic dysfunction (impaired relaxation). Right ventricular systolic function is normal. The right ventricular size is normal. Left atrial size was mildly dilated. The mitral valve is normal in structure. No evidence of mitral valve regurgitation. No evidence of mitral stenosis.  The aortic valve is normal in structure. Aortic valve regurgitation is not visualized. No aortic stenosis is present.  The inferior vena cava is normal in size with greater than 50% respiratory variability, suggesting right atrial pressure of 3 mmHg.   CT CHEST WITHOUT CONTRAST performed on 04/10/2021 Today's study demonstrates slight regression of the treated lesion in the left upper lobe, with evolving surrounding postradiation changes predominantly in the left upper lobe. Prominent left hilar lymph nodes are similar to the prior examination and warrant close attention on follow-up studies, as the possibility of metastatic disease is not excluded. Aortic atherosclerosis. There are calcifications of the aortic valve and mitral annulus. Echocardiographic correlation for evaluation of potential valvular dysfunction may be warranted if clinically indicated. Small angiomyolipoma in the left kidney incidentally noted.  NM PET IMAGE INITIAL (PI) SKULL BASE TO THIGH performed on 76/73/4193 Hypermetabolic LEFT upper lobe nodule suspicious for bronchogenic neoplasm. No signs of metastatic disease to the neck, chest, abdomen or pelvis. Mildly enlarged RIGHT axillary lymph node without metabolic activity above mediastinal blood pool. This may be reactive. Would also correlate with recent COVID vaccination if applicable.  Consider attention on follow-up. Mild periumbilical fat stranding is of uncertain significance. Correlate with any signs of cellulitis/panniculitis. Post hysterectomy and cholecystectomy as well as gastric bypass  Impression and Plan:  Stephanny Tsutsui has been referred for pre-anesthesia review and clearance prior to her undergoing the planned anesthetic and procedural courses. Available labs, pertinent testing, and imaging results were personally reviewed by me. This patient has been appropriately cleared by cardiology with an overall  MODERATE risk of significant perioperative cardiovascular complications.  Based on clinical review performed today (07/11/21), barring any significant acute changes in the patient's overall condition, it is anticipated that she will be able to proceed with the planned surgical intervention. Any acute changes in clinical condition may necessitate her procedure being postponed and/or cancelled. Patient will meet with anesthesia team (MD and/or CRNA) on the day of her procedure for preoperative evaluation/assessment. Questions regarding anesthetic course will be fielded at that time.   Pre-surgical instructions were reviewed with the patient during her PAT appointment and questions were fielded by PAT clinical staff. Patient was advised that if any questions or concerns arise prior to her procedure then she should return a call to PAT and/or her surgeon's office to discuss.  Honor Loh, MSN, APRN, FNP-C, CEN [facility]  Peri-operative Services Nurse Practitioner Phone: [phone] Fax: [phone] 07/11/21 3:59 PM  NOTE: This note has been prepared using Dragon dictation software. Despite my best ability to proofread, there is always the potential that unintentional transcriptional errors may still occur from this process.

## 2021-07-10 NOTE — Telephone Encounter (Signed)
Pt bringing sd card to upcoming appt. Nothing further needed.

## 2021-07-10 NOTE — Progress Notes (Signed)
Office Visit Note   Patient: Natasha [name]           Date of Birth: 11/26/1940           MRN: [id number] Visit Date: 07/09/2021 Requested by: Natasha Noe, MD [place],  [place] [address] PCP: Natasha Noe, MD  Subjective: Chief Complaint  Patient presents with   Other     Scan review    HPI: Natasha [name] is an 81 year old female with generally whole body pain.  Prednisone did not help.  Currently not taking medication.  MRI scan of the most affected right knee has been performed.  That shows mild patellofemoral arthritis and some arthritis of the lateral compartment but overall no stress fracture or stress reaction.              ROS: All systems reviewed are negative as they relate to the chief complaint within the history of present illness.  Patient denies  fevers or chills.   Assessment & Plan: Visit Diagnoses:  1. Chronic pain of right knee     Plan: Impression is whole body pain with unclear etiology.  At the time of this dictation vitamin D level has returned at a low level of 22.  Supplementation ordered.  8-week return and we will consider whole-body bone scanning to evaluate further if her musculoskeletal symptoms are not improved with vitamin D supplementation.  Follow-Up Instructions: Return if symptoms worsen or fail to improve.   Orders:  Orders Placed This Encounter  Procedures   Vitamin D (25 hydroxy)   No orders of the defined types were placed in this encounter.     Procedures: No procedures performed   Clinical Data: No additional findings.  Objective: Vital Signs: There were no vitals taken for this visit.  Physical Exam:   Constitutional: Patient appears well-developed HEENT:  Head: Normocephalic Eyes:EOM are normal Neck: Normal range of motion Cardiovascular: Normal rate Pulmonary/chest: Effort normal Neurologic: Patient is alert Skin: Skin is warm Psychiatric: Patient has normal mood and affect   Ortho Exam: Ortho  exam demonstrates full active and passive range of motion of both knees and ankles.  Not too much groin pain with internal/external rotation of the leg.  Hip flexion strength 5+ out of 5 bilaterally.  No warmth or swelling in any of the lower extremity joints.  Collateral cruciate ligaments are stable both knees.  Specialty Comments:  No specialty comments available.  Imaging: No results found.   PMFS History: Patient Active Problem List   Diagnosis Date Noted   CKD (chronic [name] disease) stage 4, GFR 15-29 ml/min ([place]) 06/24/2021   Polyarthralgia 06/16/2021   Muscle weakness 06/16/2021   Hyperkalemia 06/16/2021   Unsteadiness on feet 06/16/2021   Acute leg pain, right 06/16/2021   Double vision 03/13/2021   Dizziness 03/13/2021   COVID-19 virus infection 02/24/2021   Atrial flutter ([place]) 02/24/2021   Chronic bilateral low back pain with bilateral sciatica 02/17/2021   Decreased sensation 11/27/2020   Dermatitis 11/27/2020   Axillary lymphadenopathy 10/11/2020   Cancer of upper lobe of left lung ([place]) 10/11/2020   Goals of care, counseling/discussion 10/11/2020   Status post reverse total shoulder replacement, 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   Diverticulitis of colon 02/03/2020   Chronic pain syndrome 01/12/2019   Neuropathic pain 01/12/2019   At high risk for falls 11/02/2018   Neuroforaminal stenosis of lumbar spine 11/02/2018   Sacroiliitis ([place]) 11/02/2018   Former smoker 09/23/2018   Spinal stenosis of  lumbar region without neurogenic claudication 09/23/2018   Weakness of both hands 09/23/2018   Overweight (BMI 25.0-29.9) 08/10/2018   Insomnia 06/17/2018   Chronic [name] disease with symptom management only, stage 3 (moderate) (HCC) 05/27/2018   Nonrheumatic aortic valve stenosis 05/24/2018   Anemia 12/24/2017   Paroxysmal atrial fibrillation ([place]) 12/24/2017   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/24/2017   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/2017   Gastroesophageal reflux disease  11/17/2016   OSA (obstructive sleep apnea) 07/24/2016   Diverticulosis 07/21/2016   Osteopenia of multiple sites 06/04/2016   Closed compression fracture of thoracic vertebra ([place]) 04/02/2015   Mixed hyperlipidemia 04/02/2015   Restless leg syndrome 04/02/2015   Closed fracture of lateral portion of left tibial plateau 04/06/2014   Essential hypertension 04/06/2014   Mild intermittent asthma without complication 25/95/6387   Type 2 diabetes mellitus with other specified complication ([place]) 56/43/3295   Past Medical History:  Diagnosis Date   A-fib ([place])    a.) CHA2DS2-VASc Score = 6 (age x 2, sex, HTN, aortic plaque, T2DM). 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 b.) TTE 12/21/2019 and 02/25/2021: EF 60-65%; no RWMAs; mild LA dilitation; mild-mod MR; G1DD.   Diastolic dysfunction    a.) TTE 12/21/2019: ED 60-65%, LA mild dilated, mild-mod MR; PASP 36.8; G1DD. b.) TTE 02/25/2021: normal LV function with mild concentric LVH; G1DD.   Diverticulitis    Dyspnea    High cholesterol    History of hiatal hernia    History of [name] stones    Hypertension    Insomnia    Long term current use of anticoagulant    a.) apixaban   Mild asthma    Murmur    Non-small cell carcinoma of left lung, stage 1 ([place]) 09/30/2020   a.) clinical stage 1 (cT1cN0cM0). b.) treated with SBRT (60 cGy over 5 fractions).   OSA on CPAP    Osteoporosis    Restless leg    Sepsis ([place])    T2DM (type 2 diabetes mellitus) ([place])      Family History  Problem Relation Age of Onset   Other Mother        died from surgery   AAA (abdominal aortic aneurysm) Mother    Diabetes Father        controlled by diet   Dementia Father        brain atrophy - unknown origin   Breast cancer Cousin        maternal    Past Surgical History:  Procedure Laterality Date   ABDOMINAL HYSTERECTOMY  1978   APPENDECTOMY  1978   BREAST BIOPSY Left ?   papilloma   BREAST CYST EXCISION Bilateral yrs ago   benign, scars not well visualized   BREAST SURGERY     CARPAL TUNNEL RELEASE Left 04/22/2021   Procedure: Left carpal tunnel release & ulnar nerve release at elbow;  Surgeon: Hessie Knows, MD;  Location: [facility] ORS;  Service: Orthopedics;  Laterality: Left;  CATARACT EXTRACTION W/ INTRAOCULAR LENS  IMPLANT, BILATERAL Bilateral    CHOLECYSTECTOMY     COLONOSCOPY     ELBOW SURGERY Right    [place] release   EYE SURGERY     FRACTURE SURGERY     GASTRIC BYPASS  12/22/2017   Roux-N-Y   JOINT REPLACEMENT     KNEE SURGERY Left    tibial fracture with metal plate   TOTAL SHOULDER REPLACEMENT Left 2014   ULNAR TUNNEL RELEASE Left 04/22/2021   Procedure: CUBITAL TUNNEL RELEASE;  Surgeon: Hessie Knows, MD;  Location: [facility] ORS;  Service: Orthopedics;  Laterality: Left;   VIDEO BRONCHOSCOPY WITH ENDOBRONCHIAL NAVIGATION N/A 09/30/2020   Procedure: ROBOTIC ASSISTED VIDEO BRONCHOSCOPY WITH ENDOBRONCHIAL NAVIGATION;  Surgeon: Tyler Pita, MD;  Location: [facility] ORS;  Service: Pulmonary;  Laterality: N/A;   WRIST SURGERY Left    fractures   Social History   Occupational History   Not on file  Tobacco Use   Smoking status: Former    Packs/day: 1.00    Years: 12.00    Pack years: 12.00    Types: Cigarettes    Quit date: 05/26/1975    Years since quitting: 46.1   Smokeless tobacco: Never  Vaping Use   Vaping Use: Never used  Substance and Sexual Activity   Alcohol use: No    Comment: rarely   Drug use: Never   Sexual  activity: Not Currently

## 2021-07-11 ENCOUNTER — Ambulatory Visit: Payer: Medicare Other | Admitting: Primary Care

## 2021-07-11 MED ORDER — VITAMIN D (ERGOCALCIFEROL) 1.25 MG (50000 UNIT) PO CAPS
50000.0000 [IU] | ORAL_CAPSULE | ORAL | 0 refills | Status: AC
Start: 2021-07-11 — End: ?

## 2021-07-11 NOTE — Progress Notes (Unsigned)
$'@Patient'r$  ID: Natasha [name], female    DOB: [birth year], 81 y.o.   MRN: [id number]  No chief complaint on file.   Referring provider: Lesleigh Noe, MD  HPI: 81 year old female, former smoker. PMH significant for HTN, aflutter, mild intermittent asthma, OSA, lung cancer left lung, diverticulosis, GERD, type 2 diabetes. Patient of Dr. Patsey [name], last seen on 06/12/21.   Previous [facility] pulmonary encounter: 06/12/21- Dr. Stephens [name] is an 70 year old remote former smoker who presents for follow-up on the issue of left upper lobe adenocarcinoma.  She is status post SBRT to the left upper lobe for clinical stage T1 N0 M0 adenocarcinoma.  Completed SBRT to the left uppe around June beginning of July 2022.  She had noted some increased cough after SBRT but this was not bothersome to her.  However sometime in October 2022 she was diagnosed with COVID and required hospitalization.  Admitted on 3 October to [facility] discharged on 5 October.  She had issues with her A. fib/flutter going out of control and requiring Cardizem infusion as well as metoprolol.  She converted to normal sinus rhythm.  She follows with Natasha [name] for these issues.  She is currently on amiodarone for control of her A. fib/flutter.  Since her COVID diagnosis she has had worsening of her cough productive of very thick, tenacious, whitish to grayish sputum.  No hemoptysis.  She has not had any fevers, chills or sweats since her COVID diagnosis.  This is her main complaint today.  She states that the cough has been very difficult to control.  Really not on any inhalers.  She has also noted some intermittent shortness of breath.  No chest pain.  Orthopnea, paroxysmal nocturnal dyspnea or lower extremity edema.  She has sleep apnea and states she is compliant with her CPAP.  Last PFTs performed March 2017 show mild ventilatory defect with moderate defect in gas transfer.  This was done through [place], [employer].  At that time she  carried a diagnosis of mild persistent asthma.  Review of Systems A 10 point review of systems was performed and it is as noted above otherwise negative.  07/11/2021- interim hx  Patient presents today for 4-6 week follow-up for shortness of breath. During last visit she was given trial breztri and ordered for PFTs.   She is on amiodarone  She has hx OSA and reports compliance with CPAP    Allergies  Allergen Reactions   Atorvastatin     Muscle/joint aches   Lantus [Insulin Glargine] Hives   Lyrica [Pregabalin] Other (See Comments)    Headache, disorientation   Lisinopril Hives and Cough         Immunization History  Administered Date(s) Administered   Fluad Quad(high Dose 65+) 02/26/2021   Influenza Split 03/20/2014   Influenza, High Dose Seasonal PF 05/08/2020   Influenza,inj,Quad PF,6+ Mos 03/13/2017, 03/17/2018   Influenza-Unspecified 03/17/2012, 03/23/2013, 03/22/2014, 03/22/2015, 03/25/2016, 02/23/2019   Moderna SARS-COV2 Booster Vaccination 07/26/2019   Moderna Sars-Covid-2 Vaccination 06/28/2019, 05/14/2020   Pneumococcal Conjugate-13 10/25/2006, 05/11/2014   Pneumococcal Polysaccharide-23 10/31/2008, 04/06/2012    Past Medical History:  Diagnosis Date   A-fib ([place])    a.) CHA2DS2-VASc Score = 6 (age x 2, sex, HTN, aortic plaque, T2DM). 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   Chronic cough    CKD (chronic kidney disease), stage III (HCC)    Complication of anesthesia    COPD (chronic obstructive pulmonary disease) (HCC)    Depression    Diastolic dysfunction    a.) TTE 04/08/2014: EF 60%; mild concentric LVH;  G2DD. b.) TTE 12/21/2019: EF 60-65%, LA mildly dilated, mild-mod MR; PASP 36.8; G1DD. c.) TTE 02/25/2021: EF 60-65%; normal LV function with mild concentric LVH; G1DD   Diverticulitis    Dyspnea    High cholesterol    History of 2019 novel coronavirus disease (COVID-19) 02/24/2021   History of hiatal hernia    History of kidney stones    Hypertension    Insomnia    Long term current use of anticoagulant    a.) apixaban   Lumbar spinal stenosis    Mild asthma    Murmur    Non-small cell carcinoma of left lung, stage 1 ([place]) 09/30/2020   a.) clinical stage 1 (cT1cN0cM0). b.) treated with SBRT (60 cGy over 5 fractions).   OSA on CPAP    Osteoporosis    Restless leg    Sepsis ([place])    T2DM (type 2 diabetes mellitus) ([place])     Tobacco History: Social History   Tobacco Use  Smoking Status Former   Packs/day: 1.00   Years: 12.00   Pack years: 12.00   Types: Cigarettes   Quit date: 05/26/1975   Years since quitting: 46.1  Smokeless Tobacco Never   Counseling given: Not Answered   Outpatient Medications Prior to Visit  Medication Sig Dispense Refill   amiodarone (PACERONE) 200 MG tablet Take 400 mg by mouth daily.     apixaban (ELIQUIS) 5 MG TABS tablet Take 1 tablet (5 mg total) by mouth 2 (two) times daily. 180 tablet 1   Budeson-Glycopyrrol-Formoterol (BREZTRI AEROSPHERE) 160-9-4.8 MCG/ACT AERO Inhale 160 mcg into the lungs 2 (two) times daily. (Patient not taking: Reported on 07/04/2021) 5.9 g 0   Calcium Carb-Cholecalciferol (CALCIUM 600+D3 PO) Take 1 tablet by mouth in the morning.     Ferrous Gluconate (IRON 27 PO) Take 27 mg by mouth in the morning.     glucose blood (CONTOUR NEXT TEST) test strip Check sugar twice daily DX E11.69 100 each 12   HYDROcodone-acetaminophen (NORCO) 5-325 MG tablet Take 1 tablet by mouth every 6 (six) hours as needed for moderate pain. 30 tablet 0   JANUMET 50-1000 MG tablet Take 2 tablets by mouth in the morning.     JARDIANCE 25 MG TABS  tablet Take 25 mg by mouth daily.     levalbuterol (XOPENEX HFA) 45 MCG/ACT inhaler Inhale 2 puffs into the lungs every 8 (eight) hours as needed for wheezing or shortness of breath (cough). 1 each 2   metoprolol succinate (TOPROL-XL) 25 MG 24 hr tablet Take 25 mg by mouth daily.     pramipexole (MIRAPEX) 1 MG tablet Take 1 tablet (1 mg total) by mouth at bedtime. Appt with PCP needed for further refills. 90 tablet 3   rosuvastatin (CRESTOR) 20 MG tablet Take 1 tablet (20 mg total) by mouth at bedtime. 90 tablet 3   sertraline (ZOLOFT) 50 MG tablet Take 1 tablet (50 mg total) by mouth at bedtime. 90 tablet 1   spironolactone (ALDACTONE) 25 MG tablet Take 25 mg by mouth daily.  TRESIBA FLEXTOUCH 100 UNIT/ML SOPN FlexTouch Pen Inject 15 Units into the skin at bedtime.     No facility-administered medications prior to visit.      Review of Systems  Review of Systems   Physical Exam  There were no vitals taken for this visit. Physical Exam   Lab Results:  CBC    Component Value Date/Time   WBC 8.6 06/16/2021 1039   RBC 3.80 (L) 06/16/2021 1039   HGB 10.8 (L) 06/16/2021 1039   HCT 33.6 (L) 06/16/2021 1039   PLT 485.0 (H) 06/16/2021 1039   MCV 88.3 06/16/2021 1039   MCH 28.7 03/09/2021 1656   MCHC 32.3 06/16/2021 1039   RDW 16.4 (H) 06/16/2021 1039   LYMPHSABS 1.3 06/16/2021 1039   MONOABS 0.6 06/16/2021 1039   EOSABS 0.1 06/16/2021 1039   BASOSABS 0.0 06/16/2021 1039    BMET    Component Value Date/Time   NA 133 (L) 06/23/2021 1526   K 5.0 06/23/2021 1526   CL 101 06/23/2021 1526   CO2 24 06/23/2021 1526   GLUCOSE 248 (H) 06/23/2021 1526   BUN 31 (H) 06/23/2021 1526   CREATININE 1.84 (H) 06/23/2021 1526   CALCIUM 9.0 06/23/2021 1526   GFRNONAA 33 (L) 03/09/2021 1656    BNP No results found for: BNP  ProBNP No results found for: PROBNP  Imaging: MR Knee Right w/o contrast  Result Date: 06/29/2021 CLINICAL DATA:  Right knee pain for 2 weeks EXAM: MRI OF  THE RIGHT KNEE WITHOUT CONTRAST TECHNIQUE: Multiplanar, multisequence MR imaging of the knee was performed. No intravenous contrast was administered. COMPARISON:  None. FINDINGS: MENISCI Medial: Degeneration of the posterior horn of the medial meniscus. No discrete tear. Lateral: Degeneration of the body of the lateral meniscus with a linear component which does not extend to the articular surface. No discrete tear extending to the articular surface. LIGAMENTS Cruciates: ACL and PCL are intact. Collaterals: Medial collateral ligament is intact. Lateral collateral ligament complex is intact. CARTILAGE Patellofemoral: Partial-thickness cartilage loss with areas of full-thickness cartilage loss of the lateral patellar facet with subchondral reactive marrow changes. Partial-thickness cartilage loss of the medial patellofemoral compartment. Medial:  No chondral defect. Lateral: Partial-thickness cartilage loss of the weight-bearing surface of the lateral femoral condyle. JOINT: No joint effusion. Normal Hoffa's fat-pad. No plical thickening. POPLITEAL FOSSA: Popliteus tendon is intact. No Baker's cyst. EXTENSOR MECHANISM: Intact quadriceps tendon. Intact patellar tendon. Intact lateral patellar retinaculum. Intact medial patellar retinaculum. Intact MPFL. BONES: No aggressive osseous lesion. No fracture or dislocation. Other: No fluid collection or hematoma. Muscles are normal. IMPRESSION: 1. Partial-thickness cartilage loss with areas of full-thickness cartilage loss of the lateral patellar facet with subchondral reactive marrow changes. Partial-thickness cartilage loss of the medial patellofemoral compartment. 2. Partial-thickness cartilage loss of the weight-bearing surface of the lateral femoral condyle. 3. Degeneration of the body of the lateral meniscus with a linear component which does not extend to the articular surface. No discrete meniscal tear extending to the articular surface. Electronically Signed   By:  Kathreen Devoid M.D.   On: 06/29/2021 10:09   Pulmonary Function Test [facility] Only  Result Date: 06/23/2021 Spirometry Data Is Acceptable and Reproducible Consider Mild Obstructive Airways Disease Consider outpatient Pulmonary Consultation if needed Clinical Correlation Advised  XR Knee 1-2 Views Right  Result Date: 06/21/2021 AP lateral radiographs right knee reviewed.  No acute fracture.  Alignment intact.  No significant arthritis or joint space narrowing.  Patella height normal relative to distal  femur    Assessment & Plan:   No problem-specific Assessment & Plan notes found for this encounter.     Martyn Ehrich, NP 07/11/2021

## 2021-07-11 NOTE — Progress Notes (Signed)
IC s/w patient and advised  

## 2021-07-11 NOTE — Progress Notes (Signed)
She needs 50,000 units of vitamin D weekly for 8 weeks.  Can you call and tell her that her vitamin D is in the insufficient range and could be at least contributing if not causing some of her multiple joint complaints.

## 2021-07-14 ENCOUNTER — Other Ambulatory Visit: Payer: Self-pay

## 2021-07-14 ENCOUNTER — Ambulatory Visit (INDEPENDENT_AMBULATORY_CARE_PROVIDER_SITE_OTHER): Payer: Medicare Other | Admitting: Family Medicine

## 2021-07-14 ENCOUNTER — Encounter: Payer: Self-pay | Admitting: Family Medicine

## 2021-07-14 VITALS — BP 126/86 | HR 78 | Temp 97.4°F | Ht 64.0 in | Wt 137.0 lb

## 2021-07-14 DIAGNOSIS — G894 Chronic pain syndrome: Secondary | ICD-10-CM

## 2021-07-14 DIAGNOSIS — M2550 Pain in unspecified joint: Secondary | ICD-10-CM

## 2021-07-14 DIAGNOSIS — R221 Localized swelling, mass and lump, neck: Secondary | ICD-10-CM | POA: Diagnosis not present

## 2021-07-14 DIAGNOSIS — E538 Deficiency of other specified B group vitamins: Secondary | ICD-10-CM | POA: Insufficient documentation

## 2021-07-14 DIAGNOSIS — E559 Vitamin D deficiency, unspecified: Secondary | ICD-10-CM

## 2021-07-14 DIAGNOSIS — R700 Elevated erythrocyte sedimentation rate: Secondary | ICD-10-CM

## 2021-07-14 NOTE — Assessment & Plan Note (Signed)
Level of 22 Dr Marlou Sa put pt on short course of high dose ergocalciferol weekly

## 2021-07-14 NOTE — Progress Notes (Signed)
Subjective:    Patient ID: Natasha [name], female    DOB: 07-28-1940, 81 y.o.   MRN: [id number]  This visit occurred during the SARS-CoV-2 public health emergency.  Safety protocols were in place, including screening questions prior to the visit, additional usage of staff PPE, and extensive cleaning of exam room while observing appropriate contact time as indicated for disinfecting solutions.   HPI 81yo pt of Dr Einar Pheasant presents for joint pain  Wt Readings from Last 3 Encounters:  07/14/21 137 lb (62.1 kg)  06/16/21 141 lb (64 kg)  06/12/21 143 lb 12.8 oz (65.2 kg)   23.52 kg/m  Really struggling with a lot of joint pain - much worse in the past 2 months  Sharp and dull Did have some spinal injections for spinal stenosis (last one was last spring)     Hurts all over  Shoulders  Collarbone area  Elbows  Upper arms  Knees  Hips  Thighs   Muscles as well as joints   No joint swelling  No heat  No redness   No new rashes   Just came off of prednisone for her knee - a week  Did not help a lot   (started 60 mg)  Did not help the widespread pain   Massage helps shortly  She does take crestor 20 mg  (reaction to lipitor with muscle pain) This one is better  Held it for a week and it made no difference   Pt saw ortho on 2/17 for joint pain all over  Dr Marlou Sa  A and P: Assessment & Plan: Visit Diagnoses:  1. Chronic pain of right knee       Plan: Impression is whole body pain with unclear etiology.  At the time of this dictation vitamin D level has returned at a low level of 22.  Supplementation ordered.  8-week return and we will consider whole-body bone scanning to evaluate further if her musculoskeletal symptoms are not improved with vitamin D supplementation Did labs Told to start hight dose vit D  Past h/o OA and neuropathy and spinal comp fx and CKD 4 Chronic pain syndrome is listed on her problem list also as well as spinal stenosis  Had gastric bypass in the  past   Takes chronic hydrocodone Mirapex   DVT was recently r/o with doppler   Most recent labs   Lab Results  Component Value Date   ESRSEDRATE 83 (H) 06/16/2021   Lab Results  Component Value Date   CRP 0.9 02/26/2021   Lab Results  Component Value Date   VITAMINB12 194 (L) 06/16/2021   Planned b12 shot monthly for 3 months   Has had one so far   Vit d 22   Lab Results  Component Value Date   ANA NEGATIVE 06/16/2021   RF <14 06/16/2021      Lab Results  Component Value Date   CREATININE 1.84 (H) 06/23/2021   BUN 31 (H) 06/23/2021   NA 133 (L) 06/23/2021   K 5.0 06/23/2021   CL 101 06/23/2021   CO2 24 06/23/2021    GFR of 23.09  Lab Results  Component Value Date   WBC 8.6 06/16/2021   HGB 10.8 (L) 06/16/2021   HCT 33.6 (L) 06/16/2021   MCV 88.3 06/16/2021   PLT 485.0 (H) 06/16/2021   Lab Results  Component Value Date   FERRITIN 27 02/26/2021    Fam hx-father poss RA  Has surgery planned for carpal tunnel  Dr Rosita Kea ? Ulnar nerve release   Diclofenac topical does not help   Patient Active Problem List   Diagnosis Date Noted   Vitamin B12 deficiency 07/14/2021   Localized swelling, mass and lump, neck 07/14/2021   Elevated sed rate 07/14/2021   Vitamin D deficiency 07/14/2021   CKD (chronic kidney disease) stage 4, GFR 15-29 ml/min (HCC) 06/24/2021   Polyarthralgia 06/16/2021   Muscle weakness 06/16/2021   Hyperkalemia 06/16/2021   Unsteadiness on feet 06/16/2021   Acute leg pain, right 06/16/2021   Double vision 03/13/2021   Dizziness 03/13/2021   COVID-19 virus infection 02/24/2021   Atrial flutter (HCC) 02/24/2021   Chronic bilateral low back pain with bilateral sciatica 02/17/2021   Decreased sensation 11/27/2020   Dermatitis 11/27/2020   Axillary lymphadenopathy 10/11/2020   Cancer of upper lobe of left lung (HCC) 10/11/2020   Goals of care, counseling/discussion 10/11/2020   Status post reverse total shoulder replacement,  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   Diverticulitis of colon 02/03/2020   Chronic pain syndrome 01/12/2019   Neuropathic pain 01/12/2019   At high risk for falls 11/02/2018   Neuroforaminal stenosis of lumbar spine 11/02/2018   Sacroiliitis (HCC) 11/02/2018   Former smoker 09/23/2018   Spinal stenosis of lumbar region without neurogenic claudication 09/23/2018   Weakness of both hands 09/23/2018   Overweight (BMI 25.0-29.9) 08/10/2018   Insomnia 06/17/2018   Chronic kidney disease with symptom management only, stage 3 (moderate) (HCC) 05/27/2018   Nonrheumatic aortic valve stenosis 05/24/2018   Anemia 12/24/2017   Paroxysmal atrial fibrillation (HCC) 12/24/2017   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/24/2017   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/2017   Gastroesophageal reflux disease 11/17/2016   OSA (obstructive sleep apnea) 07/24/2016   Diverticulosis 07/21/2016   Osteopenia of multiple sites 06/04/2016   Closed compression fracture of thoracic vertebra (HCC) 04/02/2015   Mixed hyperlipidemia 04/02/2015   Restless leg syndrome 04/02/2015   Closed fracture of lateral portion of left tibial plateau 04/06/2014   Essential hypertension 04/06/2014   Mild intermittent asthma without complication 04/06/2014   Type 2 diabetes mellitus with other specified complication (HCC) 04/06/2014   Past Medical History:  Diagnosis Date   A-fib (HCC)    a.) CHA2DS2-VASc Score = 6 (age x 2, sex, HTN, aortic plaque, T2DM). 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   Chronic cough    CKD (chronic kidney disease), stage III (HCC)    Complication of anesthesia    COPD  (chronic obstructive pulmonary disease) (HCC)    Depression    Diastolic dysfunction    a.) TTE 04/08/2014: EF 60%; mild concentric LVH; G2DD. b.) TTE 12/21/2019: EF 60-65%, LA mildly dilated, mild-mod MR; PASP 36.8; G1DD. c.) TTE 02/25/2021: EF 60-65%; normal LV function with mild concentric LVH; G1DD   Diverticulitis    Dyspnea    High cholesterol    History of 2019 novel coronavirus disease (COVID-19) 02/24/2021   History of hiatal hernia    History of kidney stones    Hypertension    Insomnia    Long term current use of anticoagulant    a.) apixaban   Lumbar spinal stenosis  Mild asthma    Murmur    Non-small cell carcinoma of left lung, stage 1 ([place]) 09/30/2020   a.) clinical stage 1 (cT1cN0cM0). b.) treated with SBRT (60 cGy over 5 fractions).   OSA on CPAP    Osteoporosis    Restless leg    Sepsis ([place])    T2DM (type 2 diabetes mellitus) ([place])    Past Surgical History:  Procedure Laterality Date   ABDOMINAL HYSTERECTOMY  1978   APPENDECTOMY  1978   BREAST BIOPSY Left ?   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   JOINT REPLACEMENT     KNEE SURGERY Left    tibial fracture with metal plate   TOTAL SHOULDER REPLACEMENT Left 2014   ULNAR TUNNEL RELEASE Left 04/22/2021   Procedure: CUBITAL TUNNEL RELEASE;  Surgeon: Hessie Knows, MD;  Location: [facility];  Service: Orthopedics;  Laterality: Left;   VIDEO BRONCHOSCOPY WITH ENDOBRONCHIAL NAVIGATION N/A 09/30/2020   Procedure:  ROBOTIC ASSISTED VIDEO BRONCHOSCOPY WITH ENDOBRONCHIAL NAVIGATION;  Surgeon: Tyler Pita, MD;  Location: [facility];  Service: Pulmonary;  Laterality: N/A;   WRIST SURGERY Left    fractures   Social History   Tobacco Use   Smoking status: Former    Packs/day: 1.00    Years: 12.00    Pack years: 12.00    Types: Cigarettes    Quit date: 05/26/1975    Years since quitting: 46.1    Passive exposure: Past   Smokeless tobacco: Never  Vaping Use   Vaping Use: Never used  Substance Use Topics   Alcohol use: No    Comment: rarely   Drug use: Never   Family History  Problem Relation Age of Onset   Other Mother        died from surgery   AAA (abdominal aortic aneurysm) Mother    Diabetes Father        controlled by diet   Dementia Father        brain atrophy - unknown origin   Breast cancer Cousin        maternal   Allergies  Allergen Reactions   Atorvastatin     Muscle/joint aches   Lantus [Insulin Glargine] Hives   Lyrica [Pregabalin] Other (See Comments)    Headache, disorientation   Lisinopril Hives and Cough        Current Outpatient Medications on File Prior to Visit  Medication Sig Dispense Refill   amiodarone (PACERONE) 200 MG tablet Take 400 mg by mouth daily.     apixaban (ELIQUIS) 5 MG TABS tablet Take 1 tablet (5 mg total) by mouth 2 (two) times daily. 180 tablet 1   Budeson-Glycopyrrol-Formoterol (BREZTRI AEROSPHERE) 160-9-4.8 MCG/ACT AERO Inhale 160 mcg into the lungs 2 (two) times daily. 5.9 g 0   Calcium Carb-Cholecalciferol (CALCIUM 600+D3 PO) Take 1 tablet by mouth in the morning.     Ferrous  Gluconate (IRON 27 PO) Take 27 mg by mouth in the morning.     glucose blood (CONTOUR NEXT TEST) test strip Check sugar twice daily DX E11.69 100 each 12   HYDROcodone-acetaminophen (NORCO) 5-325 MG tablet Take 1 tablet by mouth every 6 (six) hours as needed for moderate pain. 30 tablet 0   JANUMET 50-1000 MG tablet Take 2 tablets by mouth in the morning.      JARDIANCE 25 MG TABS tablet Take 25 mg by mouth daily.     levalbuterol (XOPENEX HFA) 45 MCG/ACT inhaler Inhale 2 puffs into the lungs every 8 (eight) hours as needed for wheezing or shortness of breath (cough). 1 each 2   metoprolol succinate (TOPROL-XL) 25 MG 24 hr tablet Take 25 mg by mouth daily.     pramipexole (MIRAPEX) 1 MG tablet Take 1 tablet (1 mg total) by mouth at bedtime. Appt with PCP needed for further refills. 90 tablet 3   rosuvastatin (CRESTOR) 20 MG tablet Take 1 tablet (20 mg total) by mouth at bedtime. 90 tablet 3   sertraline (ZOLOFT) 50 MG tablet Take 1 tablet (50 mg total) by mouth at bedtime. 90 tablet 1   spironolactone (ALDACTONE) 25 MG tablet Take 25 mg by mouth daily.     TRESIBA FLEXTOUCH 100 UNIT/ML SOPN FlexTouch Pen Inject 15 Units into the skin at bedtime.     Vitamin D, Ergocalciferol, (DRISDOL) 1.25 MG (50000 UNIT) CAPS capsule Take 1 capsule (50,000 Units total) by mouth every 7 (seven) days. 8 capsule 0   No current facility-administered medications on file prior to visit.    Review of Systems  Constitutional:  Positive for fatigue. Negative for activity change, appetite change, fever and unexpected weight change.  HENT:  Negative for congestion, rhinorrhea, sore throat and trouble swallowing.   Eyes:  Negative for pain, redness, itching and visual disturbance.  Respiratory:  Negative for cough, chest tightness, shortness of breath and wheezing.   Cardiovascular:  Negative for chest pain and palpitations.  Gastrointestinal:  Negative for abdominal pain, blood in stool, constipation, diarrhea and nausea.  Endocrine: Negative for cold intolerance, heat intolerance, polydipsia and polyuria.  Genitourinary:  Negative for difficulty urinating, dysuria, frequency and urgency.  Musculoskeletal:  Positive for arthralgias, back pain, gait problem and myalgias. Negative for joint swelling.  Skin:  Negative for pallor and rash.  Neurological:  Negative for dizziness,  tremors, weakness, numbness and headaches.  Hematological:  Negative for adenopathy. Does not bruise/bleed easily.  Psychiatric/Behavioral:  Negative for decreased concentration and dysphoric mood. The patient is not nervous/anxious.       Objective:   Physical Exam Constitutional:      General: She is not in acute distress.    Appearance: Normal appearance. She is normal weight. She is not ill-appearing or diaphoretic.     Comments: Frail appearing  HENT:     Mouth/Throat:     Mouth: Mucous membranes are moist.  Eyes:     General:        Right eye: No discharge.        Left eye: No discharge.     Extraocular Movements: Extraocular movements intact.     Conjunctiva/sclera: Conjunctivae normal.     Pupils: Pupils are equal, round, and reactive to light.  Neck:     Comments: Tender trapezius   <1 cm firm mobile superficial mass over L clavicle  Non tender No other lumps noted  Cardiovascular:     Rate and Rhythm: Normal  rate and regular rhythm.     Heart sounds: Normal heart sounds.  Pulmonary:     Effort: Pulmonary effort is normal. No respiratory distress.     Breath sounds: Normal breath sounds. No wheezing.  Musculoskeletal:        General: Tenderness present. No swelling.     Cervical back: Neck supple. Tenderness present.     Comments: Tender shoulder girdle and thighs No acutely swollen joints Limited rom shoulders/knees/spine due to pain   Skin:    General: Skin is warm and dry.     Comments: Fair complexion/sallow  Neurological:     Mental Status: She is alert.     Coordination: Coordination normal.     Deep Tendon Reflexes: Reflexes normal.     Comments: Needs assist getting up due to pain   Psychiatric:        Mood and Affect: Mood normal.          Assessment & Plan:   Problem List Items Addressed This Visit       Other   Chronic pain syndrome - Primary    Per pt much worse in past 2 months  Muscles and joints exp in shoulder girdle Sees Dr  Rudene Christians and also Dr Marlou Sa for orthopedics   Reviewed recent notes and labs and studies in detail  ANA and RF and CRP nl but sed rate quite high Lab Results  Component Value Date   ESRSEDRATE 83 (H) 06/16/2021   ? If poss PMR?   Pt states recent prednisone for another problem did not help  She does have OA and carpal tunnel  Plan to refer to rheumatology for further eval  Pt was almost unable to walk today and req help getting up from chair        Elevated sed rate    Lab Results  Component Value Date   ESRSEDRATE 83 (H) 06/16/2021  ? If poss PMR  Prednisone recent for another problem did not help      Localized swelling, mass and lump, neck    Small superficial knot noted above L clavicle - less than 1 cm and mobile and nt  ? Cyst vs LN  No other masses noted Will f/u with pcp in approx 2 wk for re check  Will alert [place] if inc size or pain       Polyarthralgia    High ESR Exam w/o swelling of joints  ? If poss PMR Ref to rheumatologist   Ortho/Dr Marlou Sa is considering whole body bone scanning at her f/u with him      Vitamin B12 deficiency    Started a series of 3 B12 shots       Vitamin D deficiency    Level of 22 Dr Marlou Sa put pt on short course of high dose ergocalciferol weekly

## 2021-07-14 NOTE — Assessment & Plan Note (Signed)
Small superficial knot noted above L clavicle - less than 1 cm and mobile and nt  ? Cyst vs LN  No other masses noted Will f/u with pcp in approx 2 wk for re check  Will alert [place] if inc size or pain

## 2021-07-14 NOTE — Assessment & Plan Note (Signed)
Per pt much worse in past 2 months  Muscles and joints exp in shoulder girdle Sees Dr Rudene Christians and also Dr Marlou Sa for orthopedics   Reviewed recent notes and labs and studies in detail  ANA and RF and CRP nl but sed rate quite high Lab Results  Component Value Date   ESRSEDRATE 83 (H) 06/16/2021   ? If poss PMR?   Pt states recent prednisone for another problem did not help  She does have OA and carpal tunnel  Plan to refer to rheumatology for further eval  Pt was almost unable to walk today and req help getting up from chair

## 2021-07-14 NOTE — Assessment & Plan Note (Signed)
Started a series of 3 B12 shots

## 2021-07-14 NOTE — Assessment & Plan Note (Signed)
Lab Results  Component Value Date   ESRSEDRATE 83 (H) 06/16/2021   ? If poss PMR  Prednisone recent for another problem did not help

## 2021-07-14 NOTE — Patient Instructions (Addendum)
Your sed rate is elevated This can occur with some inflammatory conditions including PMR   I would like to try and refer you to a rheumatologist  If you do not get a call in a week please call us    Take care of yourself  Keep [place] posted   Follow up with Dr Einar Pheasant to re check the lump in your neck

## 2021-07-14 NOTE — Assessment & Plan Note (Addendum)
High ESR Exam w/o swelling of joints  ? If poss PMR Ref to rheumatologist   Ortho/Dr Marlou Sa is considering whole body bone scanning at her f/u with him

## 2021-07-15 ENCOUNTER — Ambulatory Visit
Admission: RE | Admit: 2021-07-15 | Discharge: 2021-07-15 | Disposition: A | Discharge status: Home / Self Care | Payer: Medicare Other | Source: Home / Self Care | Attending: Orthopedic Surgery | Admitting: Orthopedic Surgery

## 2021-07-15 ENCOUNTER — Ambulatory Visit: Payer: Medicare Other | Admitting: Urgent Care

## 2021-07-15 ENCOUNTER — Encounter
Admission: RE | Disposition: A | Discharge status: Home / Self Care | Payer: Self-pay | Source: Home / Self Care | Attending: Orthopedic Surgery

## 2021-07-15 DIAGNOSIS — G5621 Lesion of ulnar nerve, right upper limb: Secondary | ICD-10-CM | POA: Insufficient documentation

## 2021-07-15 DIAGNOSIS — Z8616 Personal history of COVID-19: Secondary | ICD-10-CM | POA: Insufficient documentation

## 2021-07-15 DIAGNOSIS — E7800 Pure hypercholesterolemia, unspecified: Secondary | ICD-10-CM | POA: Insufficient documentation

## 2021-07-15 DIAGNOSIS — F32A Depression, unspecified: Secondary | ICD-10-CM | POA: Insufficient documentation

## 2021-07-15 DIAGNOSIS — Z7901 Long term (current) use of anticoagulants: Secondary | ICD-10-CM | POA: Insufficient documentation

## 2021-07-15 DIAGNOSIS — I129 Hypertensive chronic kidney disease with stage 1 through stage 4 chronic kidney disease, or unspecified chronic kidney disease: Secondary | ICD-10-CM | POA: Insufficient documentation

## 2021-07-15 DIAGNOSIS — I480 Paroxysmal atrial fibrillation: Secondary | ICD-10-CM | POA: Insufficient documentation

## 2021-07-15 DIAGNOSIS — Z79899 Other long term (current) drug therapy: Secondary | ICD-10-CM | POA: Insufficient documentation

## 2021-07-15 DIAGNOSIS — G4733 Obstructive sleep apnea (adult) (pediatric): Secondary | ICD-10-CM | POA: Insufficient documentation

## 2021-07-15 DIAGNOSIS — Z9884 Bariatric surgery status: Secondary | ICD-10-CM | POA: Insufficient documentation

## 2021-07-15 DIAGNOSIS — E1140 Type 2 diabetes mellitus with diabetic neuropathy, unspecified: Secondary | ICD-10-CM | POA: Insufficient documentation

## 2021-07-15 DIAGNOSIS — J449 Chronic obstructive pulmonary disease, unspecified: Secondary | ICD-10-CM | POA: Insufficient documentation

## 2021-07-15 DIAGNOSIS — E1122 Type 2 diabetes mellitus with diabetic chronic kidney disease: Secondary | ICD-10-CM | POA: Insufficient documentation

## 2021-07-15 DIAGNOSIS — N183 Chronic kidney disease, stage 3 unspecified: Secondary | ICD-10-CM | POA: Insufficient documentation

## 2021-07-15 DIAGNOSIS — G2581 Restless legs syndrome: Secondary | ICD-10-CM | POA: Insufficient documentation

## 2021-07-15 DIAGNOSIS — Z87891 Personal history of nicotine dependence: Secondary | ICD-10-CM | POA: Insufficient documentation

## 2021-07-15 DIAGNOSIS — G5601 Carpal tunnel syndrome, right upper limb: Secondary | ICD-10-CM | POA: Insufficient documentation

## 2021-07-15 HISTORY — PX: ANTERIOR INTEROSSEOUS NERVE DECOMPRESSION: SHX5735

## 2021-07-15 HISTORY — DX: Spinal stenosis, lumbar region without neurogenic claudication: M48.061

## 2021-07-15 HISTORY — PX: CARPAL TUNNEL RELEASE: SHX101

## 2021-07-15 LAB — GLUCOSE, CAPILLARY
Glucose-Capillary: 140 mg/dL — ABNORMAL HIGH (ref 70–99)
Glucose-Capillary: 130 mg/dL — ABNORMAL HIGH (ref 70–99)

## 2021-07-15 SURGERY — CARPAL TUNNEL RELEASE
Anesthesia: General | Site: Arm Lower | Laterality: Right

## 2021-07-15 MED ORDER — PHENYLEPHRINE HCL (PRESSORS) 10 MG/ML IV SOLN
INTRAVENOUS | Status: DC | PRN
  Administered 2021-07-15: 80 ug via INTRAVENOUS
  Administered 2021-07-15: 160 ug via INTRAVENOUS
  Administered 2021-07-15 (×3): 80 ug via INTRAVENOUS

## 2021-07-15 MED ORDER — ONDANSETRON HCL 4 MG/2ML IJ SOLN
INTRAMUSCULAR | Status: DC | PRN
  Administered 2021-07-15: 4 mg via INTRAVENOUS

## 2021-07-15 MED ORDER — 0.9 % SODIUM CHLORIDE (POUR BTL) OPTIME
TOPICAL | Status: DC | PRN
Start: 2021-07-15 — End: 2021-07-15
  Administered 2021-07-15: 500 mL

## 2021-07-15 MED ORDER — FENTANYL CITRATE (PF) 100 MCG/2ML IJ SOLN: 25.0000 ug | INTRAMUSCULAR | Status: DC | PRN

## 2021-07-15 MED ORDER — METOCLOPRAMIDE HCL 5 MG/ML IJ SOLN: 5.0000 mg | Freq: Three times a day (TID) | INTRAMUSCULAR | Status: DC | PRN

## 2021-07-15 MED ORDER — FENTANYL CITRATE (PF) 100 MCG/2ML IJ SOLN
INTRAMUSCULAR | Status: AC
  Filled 2021-07-15: qty 2

## 2021-07-15 MED ORDER — BUPIVACAINE HCL 0.5 % IJ SOLN
INTRAMUSCULAR | Status: DC | PRN
  Administered 2021-07-15: 20 mL

## 2021-07-15 MED ORDER — CEFAZOLIN SODIUM-DEXTROSE 2-4 GM/100ML-% IV SOLN
2.0000 g | INTRAVENOUS | Status: AC
  Administered 2021-07-15: 2 g via INTRAVENOUS

## 2021-07-15 MED ORDER — FENTANYL CITRATE (PF) 100 MCG/2ML IJ SOLN
INTRAMUSCULAR | Status: DC | PRN
  Administered 2021-07-15 (×4): 25 ug via INTRAVENOUS

## 2021-07-15 MED ORDER — SODIUM CHLORIDE 0.9 % IV SOLN: INTRAVENOUS | Status: DC

## 2021-07-15 MED ORDER — ONDANSETRON HCL 4 MG/2ML IJ SOLN: 4.0000 mg | Freq: Four times a day (QID) | INTRAMUSCULAR | Status: DC | PRN

## 2021-07-15 MED ORDER — FAMOTIDINE 20 MG PO TABS: 20.0000 mg | ORAL_TABLET | Freq: Once | ORAL | Status: AC

## 2021-07-15 MED ORDER — FAMOTIDINE 20 MG PO TABS
ORAL_TABLET | ORAL | Status: AC
  Administered 2021-07-15: 20 mg via ORAL
  Filled 2021-07-15: qty 1

## 2021-07-15 MED ORDER — BUPIVACAINE HCL (PF) 0.5 % IJ SOLN
INTRAMUSCULAR | Status: AC
  Filled 2021-07-15: qty 60

## 2021-07-15 MED ORDER — LIDOCAINE HCL (PF) 2 % IJ SOLN
INTRAMUSCULAR | Status: AC
  Filled 2021-07-15: qty 5

## 2021-07-15 MED ORDER — LIDOCAINE HCL (CARDIAC) PF 100 MG/5ML IV SOSY
PREFILLED_SYRINGE | INTRAVENOUS | Status: DC | PRN
  Administered 2021-07-15: 60 mg via INTRAVENOUS

## 2021-07-15 MED ORDER — METOCLOPRAMIDE HCL 10 MG PO TABS: 5.0000 mg | ORAL_TABLET | Freq: Three times a day (TID) | ORAL | Status: DC | PRN

## 2021-07-15 MED ORDER — ONDANSETRON HCL 4 MG/2ML IJ SOLN
INTRAMUSCULAR | Status: AC
  Filled 2021-07-15: qty 2

## 2021-07-15 MED ORDER — CEFAZOLIN SODIUM-DEXTROSE 2-4 GM/100ML-% IV SOLN
INTRAVENOUS | Status: AC
  Filled 2021-07-15: qty 100

## 2021-07-15 MED ORDER — CHLORHEXIDINE GLUCONATE 0.12 % MT SOLN: 15.0000 mL | Freq: Once | OROMUCOSAL | Status: AC

## 2021-07-15 MED ORDER — HYDROCODONE-ACETAMINOPHEN 7.5-325 MG PO TABS: 1.0000 | ORAL_TABLET | Freq: Four times a day (QID) | ORAL | 0 refills | Status: DC | PRN

## 2021-07-15 MED ORDER — DEXAMETHASONE SODIUM PHOSPHATE 10 MG/ML IJ SOLN
INTRAMUSCULAR | Status: DC | PRN
Start: 2021-07-15 — End: 2021-07-15
  Administered 2021-07-15: 5 mg via INTRAVENOUS

## 2021-07-15 MED ORDER — PROPOFOL 10 MG/ML IV BOLUS
INTRAVENOUS | Status: AC
  Filled 2021-07-15: qty 20

## 2021-07-15 MED ORDER — CHLORHEXIDINE GLUCONATE 0.12 % MT SOLN
OROMUCOSAL | Status: AC
Start: 2021-07-15 — End: 2021-07-15
  Administered 2021-07-15: 15 mL via OROMUCOSAL
  Filled 2021-07-15: qty 15

## 2021-07-15 MED ORDER — ACETAMINOPHEN 10 MG/ML IV SOLN
INTRAVENOUS | Status: DC | PRN
  Administered 2021-07-15: 1000 mg via INTRAVENOUS

## 2021-07-15 MED ORDER — ONDANSETRON HCL 4 MG/2ML IJ SOLN: 4.0000 mg | Freq: Once | INTRAMUSCULAR | Status: DC | PRN

## 2021-07-15 MED ORDER — PROPOFOL 10 MG/ML IV BOLUS
INTRAVENOUS | Status: DC | PRN
  Administered 2021-07-15: 120 mg via INTRAVENOUS

## 2021-07-15 MED ORDER — ACETAMINOPHEN 10 MG/ML IV SOLN
INTRAVENOUS | Status: AC
  Filled 2021-07-15: qty 100

## 2021-07-15 MED ORDER — ONDANSETRON HCL 4 MG PO TABS: 4.0000 mg | ORAL_TABLET | Freq: Four times a day (QID) | ORAL | Status: DC | PRN

## 2021-07-15 MED ORDER — DEXAMETHASONE SODIUM PHOSPHATE 10 MG/ML IJ SOLN
INTRAMUSCULAR | Status: AC
  Filled 2021-07-15: qty 1

## 2021-07-15 MED ORDER — ORAL CARE MOUTH RINSE: 15.0000 mL | Freq: Once | OROMUCOSAL | Status: AC

## 2021-07-15 SURGICAL SUPPLY — 24 items
APL PRP STRL LF DISP 70% ISPRP (MISCELLANEOUS) ×1
BNDG ELASTIC 3X5.8 VLCR STR LF (GAUZE/BANDAGES/DRESSINGS) ×2 IMPLANT
CHLORAPREP W/TINT 26 (MISCELLANEOUS) ×2 IMPLANT
ELECT CAUTERY NDL 2.0 MIC (NEEDLE) IMPLANT
ELECT CAUTERY NEEDLE 2.0 MIC (NEEDLE) IMPLANT
GAUZE SPONGE 4X4 12PLY STRL (GAUZE/BANDAGES/DRESSINGS) ×2 IMPLANT
GAUZE XEROFORM 1X8 LF (GAUZE/BANDAGES/DRESSINGS) ×2 IMPLANT
GLOVE SURG SYN 9.0  PF PI (GLOVE) ×1
GLOVE SURG SYN 9.0 PF PI (GLOVE) ×1 IMPLANT
GOWN SRG 2XL LVL 4 RGLN SLV (GOWNS) ×1 IMPLANT
GOWN STRL NON-REIN 2XL LVL4 (GOWNS) ×2
GOWN STRL REUS W/ TWL LRG LVL3 (GOWN DISPOSABLE) ×1 IMPLANT
GOWN STRL REUS W/TWL LRG LVL3 (GOWN DISPOSABLE) ×2
KIT TURNOVER KIT A (KITS) ×2 IMPLANT
MANIFOLD NEPTUNE II (INSTRUMENTS) ×2 IMPLANT
NS IRRIG 500ML POUR BTL (IV SOLUTION) ×2 IMPLANT
PACK EXTREMITY ARMC (MISCELLANEOUS) ×2 IMPLANT
PAD CAST CTTN 4X4 STRL (SOFTGOODS) ×1 IMPLANT
PADDING CAST COTTON 4X4 STRL (SOFTGOODS) ×2
SCALPEL PROTECTED #15 DISP (BLADE) ×4 IMPLANT
SUT ETHILON 4-0 (SUTURE) ×2
SUT ETHILON 4-0 FS2 18XMFL BLK (SUTURE) ×1
SUTURE ETHLN 4-0 FS2 18XMF BLK (SUTURE) ×1 IMPLANT
WATER STERILE IRR 500ML POUR (IV SOLUTION) ×1 IMPLANT

## 2021-07-15 NOTE — Transfer of Care (Addendum)
Immediate Anesthesia Transfer of Care Note  Patient: Natasha [name]  Procedure(s) Performed: CARPAL TUNNEL RELEASE (Right: Arm Lower) Cubital tunnel release (Right: Arm Lower)  Patient Location: PACU  Anesthesia Type:General  Level of Consciousness: drowsy  Airway & Oxygen Therapy: Patient connected to face mask oxygen  Post-op Assessment: Report given to RN and Post -op Vital signs reviewed and stable  Post vital signs: Reviewed and stable  Last Vitals:  Vitals Value Taken Time  BP 83/47 07/15/21 1151  Temp    Pulse 67 07/15/21 1153  Resp 17 07/15/21 1153  SpO2 98 % 07/15/21 1153  Vitals shown include unvalidated device data.  Last Pain:  Vitals:   07/15/21 0956  TempSrc: Temporal  PainSc: 8          Complications: No notable events documented.

## 2021-07-15 NOTE — Discharge Instructions (Addendum)
Work on finger motion is much as you can.  There is numbing medicine in your arm so your hand will probably be numb most of the day. Keep dressing clean and dry Pain medicine as directed Call office if you have any problems AMBULATORY SURGERY  DISCHARGE INSTRUCTIONS   The drugs that you were given will stay in your system until tomorrow so for the next 24 hours you should not:  Drive an automobile Make any legal decisions Drink any alcoholic beverage   You may resume regular meals tomorrow.  Today it is better to start with liquids and gradually work up to solid foods.  You may eat anything you prefer, but it is better to start with liquids, then soup and crackers, and gradually work up to solid foods.   Please notify your doctor immediately if you have any unusual bleeding, trouble breathing, redness and pain at the surgery site, drainage, fever, or pain not relieved by medication.    Additional Instructions:        Please contact your physician with any problems or Same Day Surgery at [phone], Monday through Friday 6 am to 4 pm, or [place] [facility] number at [phone].

## 2021-07-15 NOTE — H&P (Signed)
Chief Complaint: Chief Complaint  Patient presents with   Right elbow pain   Natasha [name] is a 81 y.o. female who presents today for evaluation of right arm pain. She has numbness and tingling in the ulnar nerve distribution with pain along the cubital tunnel. Patient states she has been having pain for quite a while. 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 She had severe left carpal tunnel syndrome with severe left cubital tunnel syndrome which responded well to cubital tunnel and carpal tunnel release last year. She has significant thenar atrophy to the right hand but denies real severe pain numbness or tingling. No nighttime awakening. At times she will have numbness in the median nerve distribution.  Patient's chief complaint today is 2 to 3 weeks of severe body aches from her shoulders down to her lower legs. She states all of her joints are hurting and she feels very sore and drained. She denies any chest pain, shortness of breath, fevers or joint swelling.  Past Medical History: Past Medical History:  Diagnosis Date   Carpal tunnel syndrome of left wrist   Depression   Lung cancer (CMS-HCC)   Obstructive sleep apnea treated with continuous positive airway pressure (CPAP)   Osteoporosis   Paroxysmal atrial fibrillation (CMS-HCC)   Restless leg syndrome   Type 2 diabetes mellitus (CMS-HCC)   Past Surgical History: Past Surgical History:  Procedure Laterality Date   HYSTERECTOMY VAGINAL 1977   TOTAL SHOULDER REPLACEMENT 2015   Roux en Y Gastric Bypass 2019   ENDOSCOPIC CARPAL TUNNEL RELEASE Left 04/22/2021  Bernise Sylvain   ulnar nerve release Left 04/22/2021  Cadan Maggart   knee surgery   LAPAROSCOPIC CHOLECYSTECTOMY   ORIF wrist fracture   Past Family History: No family history on file.  Medications: Current Outpatient Medications Ordered in Epic  Medication Sig Dispense Refill    acetaminophen (TYLENOL) 500 MG tablet Take by mouth   AMIOdarone (PACERONE) 200 MG tablet Take 2 tablets (400 mg total) by mouth once daily   amoxicillin (AMOXIL) 875 MG tablet Take 1 tablet by mouth 2 (two) times daily   apixaban (ELIQUIS) 5 mg tablet Take by mouth every 12 (twelve) hours   blood glucose diagnostic (GLUCOSE BLOOD) test strip Dx E 11.49   calcium carbonate 500 mg calcium (1,250 mg) tablet Take by mouth once daily   empagliflozin (JARDIANCE) 25 mg tablet Take 1 tablet (25 mg total) by mouth once daily 30 tablet 11   ferrous sulfate 325 (65 FE) MG tablet Take 1 tablet (325 mg total) by mouth daily with breakfast   gabapentin (NEURONTIN) 100 MG capsule Start gabapentin $RemoveBeforeDE'100mg'mNtyaPXYwzRpCYV$  nightly for one week, then increase to $RemoveBef'200mg'QaQcXNkRqW$  nightly for one week, then increase to $RemoveBef'300mg'jLawTkQpuU$  nightly. 90 capsule 2   HYDROcodone-acetaminophen (NORCO) 5-325 mg tablet   insulin DEGLUDEC (TRESIBA FLEXTOUCH U-100) pen injector (concentration 100 units/mL) Inject 15 units nightly. 3 mL 3   metoprolol succinate (TOPROL-XL) 25 MG XL tablet Take 1 tablet (25 mg total) by mouth once daily   multivitamin capsule Take by mouth once daily   pantoprazole (PROTONIX) 40 MG DR tablet Take 1 tablet (40 mg total) by mouth once daily   pen needle, diabetic 32 gauge x 1/4" needle Use as directed 100 each 12   pramipexole (MIRAPEX) 1 MG tablet Take by mouth once daily   predniSONE (DELTASONE) 10 MG tablet Take 1 tablet (10 mg total) by mouth once daily  6 day taper, 6,5,4,3,2,1. 21 tablet 0   rosuvastatin (CRESTOR) 20 MG tablet Take by mouth once daily   sertraline (ZOLOFT) 50 MG tablet Take by mouth once daily   sitaGLIPtin-metFORMIN (JANUMET) 50-1,000 mg tablet Take 2 tablets by mouth once daily 180 tablet 3   spironolactone (ALDACTONE) 25 MG tablet Take 25 mg by mouth once daily   vitamin E 200 UNIT capsule Take by mouth once daily   No current Epic-ordered facility-administered medications on file.   Allergies: Allergies   Allergen Reactions   Lantus Solostar U-100 Insulin [Insulin Glargine] Hives   Lipitor [Atorvastatin] Other (See Comments)  Joint pain   Lisinopril Cough   Pregabalin Dizziness and Headache    Review of Systems:  A comprehensive 14 point ROS was performed, reviewed by me today, and the pertinent orthopaedic findings are documented in the HPI.  Exam: There were no vitals taken for this visit. General/Constitutional: The patient appears to be well-nourished, well-developed, and in no acute distress. Neuro/Psych: Normal mood and affect, oriented to person, place and time. Eyes: Non-icteric. Pupils are equal, round, and reactive to light, and exhibit synchronous movement. ENT: Unremarkable. Lymphatic: No palpable adenopathy. Respiratory: Non-labored breathing Cardiovascular: No edema, swelling or tenderness, except as noted in detailed exam. Integumentary: No impressive skin lesions present, except as noted in detailed exam. Musculoskeletal: Unremarkable, except as noted in detailed exam.  Right upper extremity shows tenderness in the cubital tunnel with positive Tinel's along the cubital tunnel. She has positive Tinel's and Phalen's with thenar atrophy to the right hand. She has full composite fist. Good strength with biceps triceps resistance. Sensation is intact throughout. No weakness throughout the digits.  No swelling throughout the upper or lower extremities. She ambulates no assist devices.  EMG nerve conduction studies from September 2022 Conclusion: This is an abnormal study. There was electrodiagnostic and ultrasonographic evidence of bilateral severe median neuropathy at the wrists (carpal tunnel syndrome). There was electrodiagnostic and ultrasonographic evidence of left ulnar neuropathy with sonographic localization at the elbow. There was electrodiagnostic evidence of a nonlocalizable right ulnar sensory neuropathy. Given the normal radial sensory responses, there was no  definitive electrodiagnostic evidnce of an underlying sensory neuropathy. There was no definitive electrodiagnostic of a left brachial plexopathy or left cervical radiculopathy.    Impression: Carpal tunnel syndrome of right wrist [G56.01] Carpal tunnel syndrome of right wrist (primary encounter diagnosis) Right elbow pain Multiple joint pain Cubital tunnel syndrome on right  Plan:  47. 81 year old female with right cubital tunnel and carpal tunnel syndrome. Recommend cubital tunnel and carpal tunnel release. She is ready to proceed with the surgery but is having some acute body and joint pain throughout upper and lower extremities mostly shoulders and hips. She states all joints are painful and she feels tired and fatigued. She denies any fevers, rashes. We will order blood work, rheumatoid panel, sed rate. Patient started on a 6-day steroid taper.  This note was generated in part with voice recognition software and I apologize for any typographical errors that were not detected and corrected.  Feliberto Gottron MPA-C   Electronically signed by Feliberto Gottron, PA at 07/07/2021 3:48 PM EST  Reviewed  H+P. No changes noted.

## 2021-07-15 NOTE — Anesthesia Procedure Notes (Signed)
Procedure Name: LMA Insertion Date/Time: 07/15/2021 11:12 AM Performed by: Demetrius Charity, CRNA Pre-anesthesia Checklist: Patient identified, Patient being monitored, Timeout performed, Emergency Drugs available and Suction available Patient Re-evaluated:Patient Re-evaluated prior to induction Oxygen Delivery Method: Circle system utilized Preoxygenation: Pre-oxygenation with 100% oxygen Induction Type: IV induction Ventilation: Mask ventilation without difficulty LMA: LMA inserted LMA Size: 3.5 Tube type: Oral Number of attempts: 1 Placement Confirmation: positive ETCO2 and breath sounds checked- equal and bilateral Tube secured with: Tape Dental Injury: Teeth and Oropharynx as per pre-operative assessment

## 2021-07-15 NOTE — Op Note (Signed)
07/15/2021  11:54 AM  PATIENT:  Natasha [name]  81 y.o. female  PRE-OPERATIVE DIAGNOSIS:  Carpal tunnel syndrome, right  G56.01 Cubital tunnel syndrome, right  G56.21  POST-OPERATIVE DIAGNOSIS:  Carpal tunnel syndrome, right  G56.01 Cubital tunnel syndrome  PROCEDURE:  Procedure(s): CARPAL TUNNEL RELEASE (Right) Cubital tunnel release (Right)  SURGEON: Laurene Footman, MD  ASSISTANTS: None  ANESTHESIA:   general  EBL:  No intake/output data recorded.  BLOOD ADMINISTERED:none  DRAINS: none   LOCAL MEDICATIONS USED:  MARCAINE     SPECIMEN:  No Specimen  DISPOSITION OF SPECIMEN:  N/A  COUNTS:  YES  TOURNIQUET:   Total Tourniquet Time Documented: Upper Arm (Right) - 20 minutes Total: Upper Arm (Right) - 20 minutes   IMPLANTS: None  DICTATION: .Dragon Dictation   patient was brought to the operating room and after adequate anesthesia was obtained right arm was prepped and draped in usual sterile fashion.  After patient identification timeout procedures tourniquet was raised.  Carpal tunnel was addressed first with incision in line with the ring metacarpal approximately 2 centimeters in length.  Subcutaneous tissue spread and the transverse carpal ligament identified and incised.  A vascular hemostat was placed deep to protect underlying structures and meniscus carried out proximally about a centimeter and half proximal to wrist crease is at their level of the wrist crease there appeared to be compression and there is good vascular blush following this.  Releasing more distally there was no compression past the midportion of the carpal tunnel but after complete release the wound was irrigated and infiltrated 10 cc half percent Sensorcaine and closed with simple erupted 4-0 nylon.  Going to the elbow a posterior incision was made posterior to the medial epicondyle and subcutaneous nerves were preserved as much as possible.  The cubital tunnel was identified and opened in the  midportion release carried out distally it appeared that the compression was at the level of the flexor carpi ulnaris heads going proximally there did not appear to be decompression proximal to the cubital tunnel after complete release and placing the elbow through range of motion and seeing the nerve was stable the wound was thoroughly irrigated closed with simple erupted 4-0 nylon.  An additional 10 cc of Marcaine half percent was infiltrated around this incision prior to closure.  Dressings of Xeroform 4 x 4 web roll and Ace wrap applied and tourniquet let down with close the case.  PLAN OF CARE: Discharge to home after PACU  PATIENT DISPOSITION:  PACU - hemodynamically stable.

## 2021-07-15 NOTE — Anesthesia Preprocedure Evaluation (Signed)
Anesthesia Evaluation  Patient identified by MRN, date of birth, ID band Patient awake    Reviewed: Allergy & Precautions, H&P , NPO status , Patient's Chart, lab work & pertinent test results, reviewed documented beta blocker date and time   History of Anesthesia Complications (+) history of anesthetic complications  Airway Mallampati: II  TM Distance: >3 FB Neck ROM: full    Dental  (+) Teeth Intact   Pulmonary shortness of breath and with exertion, asthma , sleep apnea , COPD,  COPD inhaler, former smoker,    Pulmonary exam normal        Cardiovascular Exercise Tolerance: Poor hypertension, On Medications Normal cardiovascular exam+ Valvular Problems/Murmurs  Rate:Normal     Neuro/Psych PSYCHIATRIC DISORDERS Depression  Neuromuscular disease    GI/Hepatic Neg liver ROS, hiatal hernia, GERD  Medicated,  Endo/Other  negative endocrine ROSdiabetes  Renal/GU Renal disease  negative genitourinary   Musculoskeletal   Abdominal   Peds  Hematology  (+) Blood dyscrasia, anemia ,   Anesthesia Other Findings   Reproductive/Obstetrics negative OB ROS                             Anesthesia Physical Anesthesia Plan  ASA: 4  Anesthesia Plan: General LMA   Post-op Pain Management:    Induction:   PONV Risk Score and Plan: 4 or greater  Airway Management Planned:   Additional Equipment:   Intra-op Plan:   Post-operative Plan:   Informed Consent: I have reviewed the patients History and Physical, chart, labs and discussed the procedure including the risks, benefits and alternatives for the proposed anesthesia with the patient or authorized representative who has indicated his/her understanding and acceptance.       Plan Discussed with: CRNA  Anesthesia Plan Comments:         Anesthesia Quick Evaluation

## 2021-07-16 ENCOUNTER — Encounter: Payer: Self-pay | Admitting: Orthopedic Surgery

## 2021-07-16 NOTE — Anesthesia Postprocedure Evaluation (Signed)
Anesthesia Post Note  Patient: Natasha [name]  Procedure(s) Performed: CARPAL TUNNEL RELEASE (Right: Arm Lower) Cubital tunnel release (Right: Arm Lower)  Patient location during evaluation: PACU Anesthesia Type: General Level of consciousness: awake and alert Pain management: pain level controlled Vital Signs Assessment: post-procedure vital signs reviewed and stable Respiratory status: spontaneous breathing, nonlabored ventilation, respiratory function stable and patient connected to nasal cannula oxygen Cardiovascular status: blood pressure returned to baseline and stable Postop Assessment: no apparent nausea or vomiting Anesthetic complications: no   No notable events documented.   Last Vitals:  Vitals:   07/15/21 1303 07/15/21 1330  BP: (!) 122/58 (!) 115/59  Pulse: 70 68  Resp: 16   Temp: (!) 36.2 C   SpO2: 97% 99%    Last Pain:  Vitals:   07/15/21 1303  TempSrc: Temporal  PainSc: 0-No pain                 Molli Barrows

## 2021-07-17 ENCOUNTER — Other Ambulatory Visit: Payer: Self-pay

## 2021-07-17 ENCOUNTER — Observation Stay: Payer: Medicare Other

## 2021-07-17 ENCOUNTER — Emergency Department: Payer: Medicare Other

## 2021-07-17 ENCOUNTER — Encounter: Payer: Self-pay | Admitting: Internal Medicine

## 2021-07-17 ENCOUNTER — Observation Stay (HOSPITAL_BASED_OUTPATIENT_CLINIC_OR_DEPARTMENT_OTHER)
Admit: 2021-07-17 | Discharge: 2021-07-17 | Disposition: A | Discharge status: Home / Self Care | Payer: Medicare Other | Attending: Internal Medicine | Admitting: Internal Medicine

## 2021-07-17 ENCOUNTER — Inpatient Hospital Stay
Admission: EM | Admit: 2021-07-17 | Discharge: 2021-07-31 | DRG: 041 | Disposition: A | Discharge status: Skilled Nursing Facility | Payer: Medicare Other | Attending: Internal Medicine | Admitting: Internal Medicine

## 2021-07-17 DIAGNOSIS — G4733 Obstructive sleep apnea (adult) (pediatric): Secondary | ICD-10-CM | POA: Diagnosis present

## 2021-07-17 DIAGNOSIS — C3490 Malignant neoplasm of unspecified part of unspecified bronchus or lung: Secondary | ICD-10-CM

## 2021-07-17 DIAGNOSIS — N390 Urinary tract infection, site not specified: Secondary | ICD-10-CM | POA: Diagnosis not present

## 2021-07-17 DIAGNOSIS — J449 Chronic obstructive pulmonary disease, unspecified: Secondary | ICD-10-CM | POA: Diagnosis present

## 2021-07-17 DIAGNOSIS — Z8673 Personal history of transient ischemic attack (TIA), and cerebral infarction without residual deficits: Secondary | ICD-10-CM | POA: Diagnosis present

## 2021-07-17 DIAGNOSIS — D631 Anemia in chronic kidney disease: Secondary | ICD-10-CM | POA: Diagnosis present

## 2021-07-17 DIAGNOSIS — E538 Deficiency of other specified B group vitamins: Secondary | ICD-10-CM | POA: Diagnosis present

## 2021-07-17 DIAGNOSIS — C3412 Malignant neoplasm of upper lobe, left bronchus or lung: Secondary | ICD-10-CM

## 2021-07-17 DIAGNOSIS — R29710 NIHSS score 10: Secondary | ICD-10-CM | POA: Diagnosis present

## 2021-07-17 DIAGNOSIS — G5601 Carpal tunnel syndrome, right upper limb: Secondary | ICD-10-CM | POA: Diagnosis present

## 2021-07-17 DIAGNOSIS — M899 Disorder of bone, unspecified: Secondary | ICD-10-CM | POA: Diagnosis present

## 2021-07-17 DIAGNOSIS — I4892 Unspecified atrial flutter: Secondary | ICD-10-CM | POA: Diagnosis present

## 2021-07-17 DIAGNOSIS — G4700 Insomnia, unspecified: Secondary | ICD-10-CM | POA: Diagnosis present

## 2021-07-17 DIAGNOSIS — T83518A Infection and inflammatory reaction due to other urinary catheter, initial encounter: Secondary | ICD-10-CM | POA: Diagnosis not present

## 2021-07-17 DIAGNOSIS — M48061 Spinal stenosis, lumbar region without neurogenic claudication: Secondary | ICD-10-CM | POA: Diagnosis present

## 2021-07-17 DIAGNOSIS — I700 Atherosclerosis of aorta: Secondary | ICD-10-CM | POA: Diagnosis present

## 2021-07-17 DIAGNOSIS — Z66 Do not resuscitate: Secondary | ICD-10-CM | POA: Diagnosis not present

## 2021-07-17 DIAGNOSIS — Z87891 Personal history of nicotine dependence: Secondary | ICD-10-CM

## 2021-07-17 DIAGNOSIS — K769 Liver disease, unspecified: Secondary | ICD-10-CM | POA: Diagnosis not present

## 2021-07-17 DIAGNOSIS — L89152 Pressure ulcer of sacral region, stage 2: Secondary | ICD-10-CM | POA: Diagnosis not present

## 2021-07-17 DIAGNOSIS — N179 Acute kidney failure, unspecified: Secondary | ICD-10-CM | POA: Diagnosis not present

## 2021-07-17 DIAGNOSIS — L8990 Pressure ulcer of unspecified site, unspecified stage: Secondary | ICD-10-CM | POA: Insufficient documentation

## 2021-07-17 DIAGNOSIS — M810 Age-related osteoporosis without current pathological fracture: Secondary | ICD-10-CM | POA: Diagnosis present

## 2021-07-17 DIAGNOSIS — L89326 Pressure-induced deep tissue damage of left buttock: Secondary | ICD-10-CM | POA: Diagnosis not present

## 2021-07-17 DIAGNOSIS — Z7901 Long term (current) use of anticoagulants: Secondary | ICD-10-CM

## 2021-07-17 DIAGNOSIS — M1990 Unspecified osteoarthritis, unspecified site: Secondary | ICD-10-CM | POA: Diagnosis present

## 2021-07-17 DIAGNOSIS — I63441 Cerebral infarction due to embolism of right cerebellar artery: Principal | ICD-10-CM | POA: Diagnosis present

## 2021-07-17 DIAGNOSIS — E1129 Type 2 diabetes mellitus with other diabetic kidney complication: Secondary | ICD-10-CM | POA: Diagnosis present

## 2021-07-17 DIAGNOSIS — N184 Chronic kidney disease, stage 4 (severe): Secondary | ICD-10-CM | POA: Diagnosis present

## 2021-07-17 DIAGNOSIS — Z9884 Bariatric surgery status: Secondary | ICD-10-CM

## 2021-07-17 DIAGNOSIS — D649 Anemia, unspecified: Secondary | ICD-10-CM

## 2021-07-17 DIAGNOSIS — Z96612 Presence of left artificial shoulder joint: Secondary | ICD-10-CM | POA: Diagnosis present

## 2021-07-17 DIAGNOSIS — K219 Gastro-esophageal reflux disease without esophagitis: Secondary | ICD-10-CM | POA: Diagnosis present

## 2021-07-17 DIAGNOSIS — Y846 Urinary catheterization as the cause of abnormal reaction of the patient, or of later complication, without mention of misadventure at the time of the procedure: Secondary | ICD-10-CM | POA: Diagnosis not present

## 2021-07-17 DIAGNOSIS — Z20822 Contact with and (suspected) exposure to covid-19: Secondary | ICD-10-CM | POA: Diagnosis present

## 2021-07-17 DIAGNOSIS — I5032 Chronic diastolic (congestive) heart failure: Secondary | ICD-10-CM | POA: Diagnosis not present

## 2021-07-17 DIAGNOSIS — C787 Secondary malignant neoplasm of liver and intrahepatic bile duct: Secondary | ICD-10-CM | POA: Diagnosis present

## 2021-07-17 DIAGNOSIS — Z888 Allergy status to other drugs, medicaments and biological substances status: Secondary | ICD-10-CM

## 2021-07-17 DIAGNOSIS — Z8616 Personal history of COVID-19: Secondary | ICD-10-CM

## 2021-07-17 DIAGNOSIS — Z7984 Long term (current) use of oral hypoglycemic drugs: Secondary | ICD-10-CM

## 2021-07-17 DIAGNOSIS — I6389 Other cerebral infarction: Secondary | ICD-10-CM

## 2021-07-17 DIAGNOSIS — C779 Secondary and unspecified malignant neoplasm of lymph node, unspecified: Secondary | ICD-10-CM | POA: Diagnosis present

## 2021-07-17 DIAGNOSIS — E7800 Pure hypercholesterolemia, unspecified: Secondary | ICD-10-CM | POA: Diagnosis present

## 2021-07-17 DIAGNOSIS — D72829 Elevated white blood cell count, unspecified: Secondary | ICD-10-CM | POA: Diagnosis present

## 2021-07-17 DIAGNOSIS — E785 Hyperlipidemia, unspecified: Secondary | ICD-10-CM | POA: Diagnosis present

## 2021-07-17 DIAGNOSIS — Z79899 Other long term (current) drug therapy: Secondary | ICD-10-CM

## 2021-07-17 DIAGNOSIS — R270 Ataxia, unspecified: Secondary | ICD-10-CM | POA: Diagnosis present

## 2021-07-17 DIAGNOSIS — I480 Paroxysmal atrial fibrillation: Secondary | ICD-10-CM | POA: Diagnosis present

## 2021-07-17 DIAGNOSIS — I639 Cerebral infarction, unspecified: Secondary | ICD-10-CM | POA: Diagnosis present

## 2021-07-17 DIAGNOSIS — R338 Other retention of urine: Secondary | ICD-10-CM

## 2021-07-17 DIAGNOSIS — Z794 Long term (current) use of insulin: Secondary | ICD-10-CM

## 2021-07-17 DIAGNOSIS — Z833 Family history of diabetes mellitus: Secondary | ICD-10-CM

## 2021-07-17 DIAGNOSIS — H532 Diplopia: Secondary | ICD-10-CM | POA: Diagnosis present

## 2021-07-17 DIAGNOSIS — E1140 Type 2 diabetes mellitus with diabetic neuropathy, unspecified: Secondary | ICD-10-CM | POA: Diagnosis present

## 2021-07-17 DIAGNOSIS — I6350 Cerebral infarction due to unspecified occlusion or stenosis of unspecified cerebral artery: Principal | ICD-10-CM | POA: Diagnosis present

## 2021-07-17 DIAGNOSIS — D509 Iron deficiency anemia, unspecified: Secondary | ICD-10-CM | POA: Diagnosis present

## 2021-07-17 DIAGNOSIS — I10 Essential (primary) hypertension: Secondary | ICD-10-CM | POA: Diagnosis present

## 2021-07-17 DIAGNOSIS — J4520 Mild intermittent asthma, uncomplicated: Secondary | ICD-10-CM | POA: Diagnosis present

## 2021-07-17 DIAGNOSIS — E1122 Type 2 diabetes mellitus with diabetic chronic kidney disease: Secondary | ICD-10-CM | POA: Diagnosis present

## 2021-07-17 DIAGNOSIS — B9620 Unspecified Escherichia coli [E. coli] as the cause of diseases classified elsewhere: Secondary | ICD-10-CM | POA: Diagnosis present

## 2021-07-17 DIAGNOSIS — G2581 Restless legs syndrome: Secondary | ICD-10-CM | POA: Diagnosis present

## 2021-07-17 DIAGNOSIS — R29810 Facial weakness: Secondary | ICD-10-CM | POA: Diagnosis present

## 2021-07-17 DIAGNOSIS — Z515 Encounter for palliative care: Secondary | ICD-10-CM

## 2021-07-17 DIAGNOSIS — I130 Hypertensive heart and chronic kidney disease with heart failure and stage 1 through stage 4 chronic kidney disease, or unspecified chronic kidney disease: Secondary | ICD-10-CM | POA: Diagnosis present

## 2021-07-17 DIAGNOSIS — D696 Thrombocytopenia, unspecified: Secondary | ICD-10-CM | POA: Diagnosis not present

## 2021-07-17 DIAGNOSIS — Z823 Family history of stroke: Secondary | ICD-10-CM

## 2021-07-17 DIAGNOSIS — C7931 Secondary malignant neoplasm of brain: Secondary | ICD-10-CM | POA: Diagnosis present

## 2021-07-17 DIAGNOSIS — F32A Depression, unspecified: Secondary | ICD-10-CM | POA: Diagnosis present

## 2021-07-17 DIAGNOSIS — G5621 Lesion of ulnar nerve, right upper limb: Secondary | ICD-10-CM | POA: Diagnosis present

## 2021-07-17 DIAGNOSIS — C7951 Secondary malignant neoplasm of bone: Secondary | ICD-10-CM | POA: Diagnosis present

## 2021-07-17 DIAGNOSIS — G8194 Hemiplegia, unspecified affecting left nondominant side: Secondary | ICD-10-CM | POA: Diagnosis present

## 2021-07-17 LAB — CBC
HCT: 29.1 % — ABNORMAL LOW (ref 36.0–46.0)
Hemoglobin: 9.5 g/dL — ABNORMAL LOW (ref 12.0–15.0)
MCH: 28.4 pg (ref 26.0–34.0)
MCHC: 32.6 g/dL (ref 30.0–36.0)
MCV: 86.9 fL (ref 80.0–100.0)
Platelets: 131 10*3/uL — ABNORMAL LOW (ref 150–400)
RBC: 3.35 MIL/uL — ABNORMAL LOW (ref 3.87–5.11)
RDW: 16 % — ABNORMAL HIGH (ref 11.5–15.5)
WBC: 11.9 10*3/uL — ABNORMAL HIGH (ref 4.0–10.5)
nRBC: 0 % (ref 0.0–0.2)

## 2021-07-17 LAB — LIPID PANEL
Cholesterol: 100 mg/dL (ref 0–200)
HDL: 30 mg/dL — ABNORMAL LOW (ref >40)
LDL Cholesterol: 30 mg/dL (ref 0–99)
Total CHOL/HDL Ratio: 3.3 RATIO
Triglycerides: 198 mg/dL — ABNORMAL HIGH (ref <150)
VLDL: 40 mg/dL (ref 0–40)

## 2021-07-17 LAB — HEPATITIS PANEL, ACUTE
HCV Ab: NONREACTIVE
Hep A IgM: NONREACTIVE
Hep B C IgM: NONREACTIVE
Hepatitis B Surface Ag: NONREACTIVE

## 2021-07-17 LAB — BASIC METABOLIC PANEL
Anion gap: 11 (ref 5–15)
BUN: 37 mg/dL — ABNORMAL HIGH (ref 8–23)
CO2: 21 mmol/L — ABNORMAL LOW (ref 22–32)
Calcium: 8.5 mg/dL — ABNORMAL LOW (ref 8.9–10.3)
Chloride: 103 mmol/L (ref 98–111)
Creatinine, Ser: 1.56 mg/dL — ABNORMAL HIGH (ref 0.44–1.00)
GFR, Estimated: 33 mL/min — ABNORMAL LOW (ref >60)
Glucose, Bld: 241 mg/dL — ABNORMAL HIGH (ref 70–99)
Potassium: 4.7 mmol/L (ref 3.5–5.1)
Sodium: 135 mmol/L (ref 135–145)

## 2021-07-17 LAB — ECHOCARDIOGRAM COMPLETE
AR max vel: 2.47 cm2
AV Area VTI: 3.16 cm2
AV Area mean vel: 2.53 cm2
AV Mean grad: 2.5 mmHg
AV Peak grad: 4.3 mmHg
Ao pk vel: 1.04 m/s
Area-P 1/2: 2.76 cm2
Height: 64 in
MV VTI: 2.13 cm2
S' Lateral: 2.34 cm
Weight: 2192 oz

## 2021-07-17 LAB — PROTIME-INR
INR: 1.3 — ABNORMAL HIGH (ref 0.8–1.2)
Prothrombin Time: 15.8 seconds — ABNORMAL HIGH (ref 11.4–15.2)

## 2021-07-17 LAB — RESP PANEL BY RT-PCR (FLU A&B, COVID) ARPGX2
Influenza A by PCR: NEGATIVE
Influenza B by PCR: NEGATIVE
SARS Coronavirus 2 by RT PCR: NEGATIVE

## 2021-07-17 LAB — GLUCOSE, CAPILLARY
Glucose-Capillary: 157 mg/dL — ABNORMAL HIGH (ref 70–99)
Glucose-Capillary: 166 mg/dL — ABNORMAL HIGH (ref 70–99)

## 2021-07-17 LAB — APTT: aPTT: 32 seconds (ref 24–36)

## 2021-07-17 LAB — CBG MONITORING, ED
Glucose-Capillary: 262 mg/dL — ABNORMAL HIGH (ref 70–99)
Glucose-Capillary: 221 mg/dL — ABNORMAL HIGH (ref 70–99)

## 2021-07-17 MED ORDER — INSULIN ASPART 100 UNIT/ML IJ SOLN
0.0000 [IU] | Freq: Three times a day (TID) | INTRAMUSCULAR | Status: DC
  Administered 2021-07-17: 2 [IU] via SUBCUTANEOUS
  Administered 2021-07-17: 5 [IU] via SUBCUTANEOUS
  Administered 2021-07-17: 3 [IU] via SUBCUTANEOUS
  Administered 2021-07-18: 09:00:00 2 [IU] via SUBCUTANEOUS
  Administered 2021-07-18: 1 [IU] via SUBCUTANEOUS
  Administered 2021-07-19: 17:00:00 3 [IU] via SUBCUTANEOUS
  Administered 2021-07-19: 09:00:00 2 [IU] via SUBCUTANEOUS
  Administered 2021-07-19: 3 [IU] via SUBCUTANEOUS
  Administered 2021-07-20: 1 [IU] via SUBCUTANEOUS
  Administered 2021-07-20: 2 [IU] via SUBCUTANEOUS
  Administered 2021-07-20: 3 [IU] via SUBCUTANEOUS
  Administered 2021-07-21: 1 [IU] via SUBCUTANEOUS
  Administered 2021-07-21: 2 [IU] via SUBCUTANEOUS
  Administered 2021-07-22 – 2021-07-23 (×5): 3 [IU] via SUBCUTANEOUS
  Administered 2021-07-23: 13:00:00 5 [IU] via SUBCUTANEOUS
  Administered 2021-07-24 – 2021-07-25 (×4): 3 [IU] via SUBCUTANEOUS
  Administered 2021-07-25: 5 [IU] via SUBCUTANEOUS
  Administered 2021-07-25: 2 [IU] via SUBCUTANEOUS
  Administered 2021-07-26 (×2): 3 [IU] via SUBCUTANEOUS
  Administered 2021-07-26: 2 [IU] via SUBCUTANEOUS
  Administered 2021-07-27: 3 [IU] via SUBCUTANEOUS
  Administered 2021-07-27: 2 [IU] via SUBCUTANEOUS
  Administered 2021-07-27: 3 [IU] via SUBCUTANEOUS
  Administered 2021-07-28: 2 [IU] via SUBCUTANEOUS
  Administered 2021-07-28 – 2021-07-29 (×3): 1 [IU] via SUBCUTANEOUS
  Administered 2021-07-29 – 2021-07-30 (×2): 2 [IU] via SUBCUTANEOUS
  Administered 2021-07-30: 1 [IU] via SUBCUTANEOUS
  Administered 2021-07-31: 13:00:00 2 [IU] via SUBCUTANEOUS
  Administered 2021-07-31: 08:00:00 1 [IU] via SUBCUTANEOUS
  Filled 2021-07-17 (×35): qty 1

## 2021-07-17 MED ORDER — ACETAMINOPHEN 325 MG PO TABS: 650.0000 mg | ORAL_TABLET | ORAL | Status: DC | PRN

## 2021-07-17 MED ORDER — ACETAMINOPHEN 325 MG PO TABS: 650.0000 mg | ORAL_TABLET | Freq: Four times a day (QID) | ORAL | Status: DC | PRN

## 2021-07-17 MED ORDER — ROSUVASTATIN CALCIUM 20 MG PO TABS
20.0000 mg | ORAL_TABLET | Freq: Every day | ORAL | Status: DC
  Administered 2021-07-17 – 2021-07-21 (×5): 20 mg via ORAL
  Filled 2021-07-17 (×6): qty 1

## 2021-07-17 MED ORDER — PRAMIPEXOLE DIHYDROCHLORIDE 1 MG PO TABS
1.0000 mg | ORAL_TABLET | Freq: Every day | ORAL | Status: DC
  Administered 2021-07-17 – 2021-07-30 (×14): 1 mg via ORAL
  Filled 2021-07-17 (×15): qty 1

## 2021-07-17 MED ORDER — INSULIN ASPART 100 UNIT/ML IJ SOLN
0.0000 [IU] | Freq: Every day | INTRAMUSCULAR | Status: DC
  Administered 2021-07-21 – 2021-07-22 (×2): 2 [IU] via SUBCUTANEOUS
  Filled 2021-07-17 (×2): qty 1

## 2021-07-17 MED ORDER — SODIUM CHLORIDE 0.9 % IV SOLN
INTRAVENOUS | Status: DC
Start: 2021-07-17 — End: 2021-07-17

## 2021-07-17 MED ORDER — AMIODARONE HCL 200 MG PO TABS
400.0000 mg | ORAL_TABLET | Freq: Every day | ORAL | Status: DC
  Administered 2021-07-17 – 2021-07-22 (×5): 400 mg via ORAL
  Filled 2021-07-17 (×5): qty 2

## 2021-07-17 MED ORDER — OYSTER SHELL CALCIUM/D3 500-5 MG-MCG PO TABS
1.0000 | ORAL_TABLET | Freq: Every day | ORAL | Status: DC
Start: 2021-07-17 — End: 2021-07-31
  Administered 2021-07-17 – 2021-07-31 (×14): 1 via ORAL
  Filled 2021-07-17 (×14): qty 1

## 2021-07-17 MED ORDER — ENOXAPARIN SODIUM 30 MG/0.3ML IJ SOSY
30.0000 mg | PREFILLED_SYRINGE | INTRAMUSCULAR | Status: DC
  Administered 2021-07-17: 30 mg via SUBCUTANEOUS
  Filled 2021-07-17: qty 0.3

## 2021-07-17 MED ORDER — HYDRALAZINE HCL 20 MG/ML IJ SOLN: 5.0000 mg | INTRAMUSCULAR | Status: DC | PRN

## 2021-07-17 MED ORDER — DM-GUAIFENESIN ER 30-600 MG PO TB12
1.0000 | ORAL_TABLET | Freq: Two times a day (BID) | ORAL | Status: DC | PRN
Start: 2021-07-17 — End: 2021-07-31

## 2021-07-17 MED ORDER — ALBUTEROL SULFATE (2.5 MG/3ML) 0.083% IN NEBU: 3.0000 mL | INHALATION_SOLUTION | RESPIRATORY_TRACT | Status: DC | PRN

## 2021-07-17 MED ORDER — ACETAMINOPHEN 325 MG RE SUPP: 650.0000 mg | Freq: Four times a day (QID) | RECTAL | Status: DC | PRN

## 2021-07-17 MED ORDER — ASPIRIN EC 325 MG PO TBEC
325.0000 mg | DELAYED_RELEASE_TABLET | Freq: Every day | ORAL | Status: DC
  Administered 2021-07-18: 09:00:00 325 mg via ORAL
  Filled 2021-07-17: qty 1

## 2021-07-17 MED ORDER — STROKE: EARLY STAGES OF RECOVERY BOOK: Freq: Once | Status: DC

## 2021-07-17 MED ORDER — HYDROCODONE-ACETAMINOPHEN 7.5-325 MG PO TABS
1.0000 | ORAL_TABLET | Freq: Four times a day (QID) | ORAL | Status: DC | PRN
  Administered 2021-07-17 – 2021-07-24 (×9): 1 via ORAL
  Filled 2021-07-17 (×10): qty 1

## 2021-07-17 MED ORDER — ACETAMINOPHEN 325 MG RE SUPP: 650.0000 mg | RECTAL | Status: DC | PRN

## 2021-07-17 MED ORDER — ACETAMINOPHEN 325 MG PO TABS
650.0000 mg | ORAL_TABLET | Freq: Four times a day (QID) | ORAL | Status: DC | PRN
  Administered 2021-07-17 – 2021-07-31 (×13): 650 mg via ORAL
  Filled 2021-07-17 (×14): qty 2

## 2021-07-17 MED ORDER — MECLIZINE HCL 25 MG PO TABS
25.0000 mg | ORAL_TABLET | Freq: Once | ORAL | Status: AC
  Administered 2021-07-17: 25 mg via ORAL
  Filled 2021-07-17: qty 1

## 2021-07-17 MED ORDER — OXYCODONE HCL 5 MG PO TABS
5.0000 mg | ORAL_TABLET | Freq: Four times a day (QID) | ORAL | Status: DC | PRN
  Administered 2021-07-17 – 2021-07-21 (×5): 5 mg via ORAL
  Filled 2021-07-17 (×5): qty 1

## 2021-07-17 MED ORDER — SERTRALINE HCL 50 MG PO TABS
50.0000 mg | ORAL_TABLET | Freq: Every day | ORAL | Status: DC
  Administered 2021-07-17 – 2021-07-30 (×14): 50 mg via ORAL
  Filled 2021-07-17 (×15): qty 1

## 2021-07-17 MED ORDER — FERROUS GLUCONATE 324 (38 FE) MG PO TABS
324.0000 mg | ORAL_TABLET | Freq: Every morning | ORAL | Status: DC
  Administered 2021-07-17 – 2021-07-30 (×14): 324 mg via ORAL
  Filled 2021-07-17 (×15): qty 1

## 2021-07-17 MED ORDER — GABAPENTIN 300 MG PO CAPS
300.0000 mg | ORAL_CAPSULE | Freq: Every day | ORAL | Status: DC
  Administered 2021-07-17 – 2021-07-30 (×14): 300 mg via ORAL
  Filled 2021-07-17 (×14): qty 1

## 2021-07-17 MED ORDER — SENNOSIDES-DOCUSATE SODIUM 8.6-50 MG PO TABS: 1.0000 | ORAL_TABLET | Freq: Every evening | ORAL | Status: DC | PRN

## 2021-07-17 MED ORDER — ONDANSETRON HCL 4 MG/2ML IJ SOLN
4.0000 mg | Freq: Three times a day (TID) | INTRAMUSCULAR | Status: DC | PRN
Start: 2021-07-17 — End: 2021-07-31

## 2021-07-17 MED ORDER — METOPROLOL SUCCINATE ER 25 MG PO TB24
25.0000 mg | ORAL_TABLET | Freq: Every day | ORAL | Status: DC
Start: 2021-07-17 — End: 2021-07-22
  Administered 2021-07-17 – 2021-07-22 (×5): 25 mg via ORAL
  Filled 2021-07-17 (×5): qty 1

## 2021-07-17 MED ORDER — CHLORHEXIDINE GLUCONATE 0.12 % MT SOLN
5.0000 mL | Freq: Two times a day (BID) | OROMUCOSAL | Status: DC
  Administered 2021-07-22 – 2021-07-28 (×9): 5 mL via OROMUCOSAL
  Filled 2021-07-17 (×21): qty 15

## 2021-07-17 MED ORDER — LEVALBUTEROL HCL 1.25 MG/0.5ML IN NEBU: 1.2500 mg | INHALATION_SOLUTION | Freq: Three times a day (TID) | RESPIRATORY_TRACT | Status: DC | PRN

## 2021-07-17 MED ORDER — ACETAMINOPHEN 160 MG/5ML PO SOLN
650.0000 mg | ORAL | Status: DC | PRN
  Filled 2021-07-17: qty 20.3

## 2021-07-17 MED ORDER — PANTOPRAZOLE SODIUM 40 MG PO TBEC
40.0000 mg | DELAYED_RELEASE_TABLET | Freq: Every day | ORAL | Status: DC
  Administered 2021-07-17 – 2021-07-23 (×6): 40 mg via ORAL
  Filled 2021-07-17 (×6): qty 1

## 2021-07-17 MED ORDER — ASPIRIN 81 MG PO CHEW
324.0000 mg | CHEWABLE_TABLET | Freq: Once | ORAL | Status: AC
  Administered 2021-07-17: 324 mg via ORAL
  Filled 2021-07-17: qty 4

## 2021-07-17 NOTE — Consult Note (Incomplete)
Neurology Consultation Reason for Consult:  Referring Physician: ***  CC: ***  History is obtained from:***  HPI: Natasha [name] is a 81 y.o. female ***   LKW: *** tpa given?: no, *** Premorbid modified rankin scale: *** ICH Score: ***    ROS: A 14 point ROS was performed and is negative except as noted in the HPI. *** Unable to obtain due to altered mental status.   Past Medical History:  Diagnosis Date   A-fib [facility])    a.) CHA2DS2-VASc Score = 6 (age x 2, sex, HTN, aortic plaque, T2DM). 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   Chronic cough    CKD (chronic kidney disease), stage III (HCC)    Complication of anesthesia    COPD (chronic obstructive pulmonary disease) (HCC)    Depression    Diastolic dysfunction    a.) TTE 04/08/2014: EF 60%; mild concentric LVH; G2DD. b.) TTE 12/21/2019: EF 60-65%, LA mildly dilated, mild-mod MR; PASP 36.8; G1DD. 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 b.) treated with SBRT (60 cGy over 5 fractions).   OSA on CPAP    Osteoporosis    Restless leg    Sepsis (HCC)    T2DM (type 2 diabetes mellitus) ([place])    ***  Family History  Problem  Relation Age of Onset   Other Mother        died from surgery   AAA (abdominal aortic aneurysm) Mother    Diabetes Father        controlled by diet   Dementia Father        brain atrophy - unknown origin   Breast cancer Cousin        maternal   ***  Social History:  reports that she quit smoking about 46 years ago. Her smoking use included cigarettes. She has a 12.00 pack-year smoking history. She has been exposed to tobacco smoke. She has never used smokeless tobacco. She reports that she does not drink alcohol and does not use drugs. ***  Exam: Current vital signs: BP 122/60    Pulse 68    Temp 98.4 F (36.9 C) (Oral)    Resp 15    Ht $R'5\' 4"'yI$  (1.626 m)    Wt 62.1 kg    SpO2 90%    BMI 23.52 kg/m  Vital signs in last 24 hours: Temp:  [98.4 F (36.9 C)] 98.4 F (36.9 C) (02/23 0344) Pulse Rate:  [66-75] 68 (02/23 1400) Resp:  [15-22] 15 (02/23 1400) BP: (119-136)/(60-68) 122/60 (02/23 1400) SpO2:  [90 %-94 %] 90 % (02/23 1400)  Weight:  [62.1 kg] 62.1 kg (02/23 0346)   Physical Exam  Constitutional: Appears well-developed and well-nourished.  Psych: Affect appropriate to situation Eyes: No scleral injection HENT: No OP obstruction MSK: no joint deformities.  Cardiovascular: Normal rate and regular rhythm.  Respiratory: Effort normal, non-labored breathing GI: Soft.  No distension. There is no tenderness.  Skin: WDI  Neuro: Mental Status: Patient is awake, alert, oriented to person, place, month, year, and situation.*** Patient is able to give a clear and coherent history.*** No signs of aphasia or neglect*** Cranial Nerves: II: Visual Fields are full. Pupils are equal, round, and reactive to light.  *** III,IV, VI: EOMI without ptosis or diploplia.  V: Facial sensation is symmetric to temperature VII: Facial movement is symmetric.  VIII: hearing is intact to voice X: Uvula elevates symmetrically XI: Shoulder shrug is symmetric. XII: tongue is midline without  atrophy or fasciculations.  Motor: Tone is normal. Bulk is normal. 5/5 strength was present in all four extremities. *** Sensory: Sensation is symmetric to light touch and temperature in the arms and legs.*** Deep Tendon Reflexes: 2+ and symmetric in the biceps and patellae. *** Plantars: Toes are downgoing bilaterally. *** Cerebellar: FNF and HKS are intact bilaterally***      I have reviewed labs in epic and the results pertinent to this consultation are: ***  I have reviewed the images obtained:***  Impression: ***  Recommendations: 1) ***   Roland Rack, MD [facility] [phone]  If 7pm- 7am, please page neurology on call as listed in [place].

## 2021-07-17 NOTE — ED Provider Notes (Signed)
[facility] Provider Note    Event Date/Time   First MD Initiated Contact with Patient 07/17/21 [phone]     (approximate)   History   Dizziness   HPI  Natasha [name] is a 81 y.o. female with history of atrial fibrillation on Eliquis, CHF, hypertension, hyperlipidemia, diabetes, COPD, chronic kidney disease who presents to the emergency department with complaints of vertigo.  States she went to sleep yesterday around 4 PM and felt fine.  She woke up around midnight to go to the bathroom and felt like the room was spinning.  Symptoms worse with movement and standing.  Denies any headache, head injury, numbness, tingling, weakness, vision changes.  Has never had similar symptoms.  No previous history of stroke.  Denies chest pain or shortness of breath.  No vomiting or diarrhea.  Patient was taken off of her Eliquis due to having carpal tunnel release to the right upper extremity with Dr. Rosita Kea on 07/15/2021.  She has not resumed this medication.  She denies any previous history of vertigo.  No hearing loss, ear pain or tinnitus.   History provided by patient and husband.    Past Medical History:  Diagnosis Date   A-fib [facility])    a.) CHA2DS2-VASc Score = 6 (age x 2, sex, HTN, aortic plaque, T2DM). 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   Chronic cough    CKD (chronic kidney disease), stage III (HCC)    Complication of anesthesia    COPD (chronic obstructive pulmonary disease) (HCC)    Depression    Diastolic dysfunction    a.) TTE 04/08/2014: EF 60%; mild concentric LVH; G2DD. b.) TTE 12/21/2019: EF 60-65%, LA mildly  dilated, mild-mod MR; PASP 36.8; G1DD. 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 b.) treated with SBRT (60 cGy over 5 fractions).   OSA on CPAP    Osteoporosis    Restless leg    Sepsis (HCC)    T2DM (type 2 diabetes mellitus) (HCC)     Past Surgical History:  Procedure Laterality Date   ABDOMINAL HYSTERECTOMY  1978   ANTERIOR INTEROSSEOUS NERVE DECOMPRESSION Right 07/15/2021   Procedure: Cubital tunnel release;  Surgeon: Kennedy Bucker, MD;  Location: [facility] ORS;  Service: Orthopedics;  Laterality: Right;   APPENDECTOMY  1978   BREAST BIOPSY Left ?   papilloma   BREAST CYST EXCISION Bilateral yrs ago   benign, scars not well visualized   BREAST SURGERY     CARPAL TUNNEL RELEASE Left 04/22/2021   Procedure: Left carpal tunnel release & ulnar nerve release at elbow;  Surgeon: Hessie Knows, MD;  Location: [facility] ORS;  Service: Orthopedics;  Laterality: Left;   CARPAL TUNNEL RELEASE Right 07/15/2021   Procedure: CARPAL TUNNEL RELEASE;  Surgeon: Hessie Knows, MD;  Location: [facility] ORS;  Service: Orthopedics;  Laterality: Right;   CATARACT EXTRACTION W/ INTRAOCULAR LENS  IMPLANT, BILATERAL Bilateral    CHOLECYSTECTOMY     COLONOSCOPY     ELBOW SURGERY Right    [place] release   EYE SURGERY     FRACTURE SURGERY     GASTRIC BYPASS  12/22/2017   Roux-N-Y   JOINT REPLACEMENT     KNEE SURGERY Left    tibial fracture with metal plate   TOTAL SHOULDER REPLACEMENT Left 2014   ULNAR TUNNEL RELEASE Left 04/22/2021   Procedure: CUBITAL TUNNEL  RELEASE;  Surgeon: Hessie Knows, MD;  Location: [facility] ORS;  Service: Orthopedics;  Laterality: Left;   VIDEO BRONCHOSCOPY WITH ENDOBRONCHIAL NAVIGATION N/A 09/30/2020   Procedure: ROBOTIC ASSISTED VIDEO BRONCHOSCOPY WITH ENDOBRONCHIAL NAVIGATION;  Surgeon: Tyler Pita, MD;  Location: [facility] ORS;  Service: Pulmonary;  Laterality: N/A;   WRIST SURGERY Left    fractures    MEDICATIONS:  Prior to Admission medications   Medication Sig Start Date End Date Taking? Authorizing Provider  amiodarone (PACERONE) 200 MG tablet Take 400 mg by mouth daily. 03/03/21   [provider]  amoxicillin (AMOXIL) 500 MG capsule Take 500 mg by mouth 3 (three) times daily. Take until finished. 07/08/21   [provider]  apixaban (ELIQUIS) 5 MG TABS tablet Take 1 tablet (5 mg total) by mouth 2 (two) times daily. 02/17/21   Lesleigh Noe, MD  Budeson-Glycopyrrol-Formoterol (BREZTRI AEROSPHERE) 160-9-4.8 MCG/ACT AERO Inhale 160 mcg into the lungs 2 (two) times daily. 06/12/21   Tyler Pita, MD  Calcium Carb-Cholecalciferol (CALCIUM 600+D3 PO) Take 1 tablet by mouth in the morning.    [provider]  chlorhexidine (PERIDEX) 0.12 % solution Rinse mouth with 15 ml for 30 seconds in the morning and evening after brushing. Then spit. 07/08/21   [provider]  Ferrous Gluconate (IRON 27 PO) Take 27 mg by mouth in the morning.    [provider]  glucose blood (CONTOUR NEXT TEST) test strip Check sugar twice daily DX E11.69 02/24/21   Lesleigh Noe, MD  HYDROcodone-acetaminophen (NORCO) 7.5-325 MG tablet Take 1 tablet by mouth every 6 (six) hours as needed for moderate pain. 07/15/21   Hessie Knows, MD  JANUMET 50-1000 MG tablet Take 2 tablets by mouth in the morning. 02/08/17   [provider]  JARDIANCE 25 MG TABS tablet Take 25 mg by mouth daily. 06/13/21   [provider]  levalbuterol Penne Lash HFA) 45 MCG/ACT inhaler Inhale 2 puffs into the lungs  every 8 (eight) hours as needed for wheezing or shortness of breath (cough). 06/12/21 06/12/22  Tyler Pita, MD  metoprolol succinate (TOPROL-XL) 25 MG 24 hr tablet Take 25 mg by mouth daily. 03/31/21   [provider]  pantoprazole (PROTONIX) 40 MG tablet Take 40 mg by mouth daily. 07/17/21   [provider]  pramipexole (MIRAPEX) 1 MG tablet Take 1 tablet (1 mg total) by mouth at bedtime. Appt with PCP needed for further refills. 11/27/20   Lesleigh Noe, MD  rosuvastatin (CRESTOR) 20 MG tablet Take 1 tablet (20 mg total) by mouth at bedtime. 01/17/21   Lesleigh Noe, MD  sertraline (ZOLOFT) 50 MG tablet Take 1 tablet (50 mg total) by mouth at  bedtime. 06/25/21   Lesleigh Noe, MD  spironolactone (ALDACTONE) 25 MG tablet Take 25 mg by mouth daily. 05/01/21   [provider]  TRESIBA FLEXTOUCH 100 UNIT/ML SOPN FlexTouch Pen Inject 15 Units into the skin at bedtime. 03/26/17   [provider]  Vitamin D, Ergocalciferol, (DRISDOL) 1.25 MG (50000 UNIT) CAPS capsule Take 1 capsule (50,000 Units total) by mouth every 7 (seven) days. 07/11/21   Meredith Pel, MD    Physical Exam   Triage Vital Signs: ED Triage Vitals  Enc Vitals Group     BP 07/17/21 0344 129/64     Pulse Rate 07/17/21 0344 70     Resp 07/17/21 0344 16     Temp 07/17/21 0344 98.4 F (36.9 C)     Temp Source 07/17/21 0344 Oral     SpO2 07/17/21 0344 94 %     Weight 07/17/21 0346 137 lb (62.1 kg)     Height 07/17/21 0346 $RemoveBefor'5\' 4"'lHLyIyXthuLs$  (1.626 m)     Head Circumference --      Peak Flow --      Pain Score 07/17/21 0345 0     Pain Loc --      Pain Edu? --      Excl. in [place]? --     Most recent vital signs: Vitals:   07/17/21 0344  BP: 129/64  Pulse: 70  Resp: 16  Temp: 98.4 F (36.9 C)  SpO2: 94%    CONSTITUTIONAL: Alert and oriented and responds appropriately to questions. Well-appearing; well-nourished, elderly HEAD: Normocephalic, atraumatic EYES: Conjunctivae clear, pupils  appear equal, sclera nonicteric ENT: normal nose; moist mucous membranes; TMs are clear bilaterally without erythema, purulence, bulging, perforation, effusion.  No cerumen impaction or sign of foreign body in the external auditory canal. No inflammation, erythema or drainage from the external auditory canal. No signs of mastoiditis. No pain with manipulation of the pinna bilaterally. NECK: Supple, normal ROM CARD: RRR; S1 and S2 appreciated; no murmurs, no clicks, no rubs, no gallops RESP: Normal chest excursion without splinting or tachypnea; breath sounds clear and equal bilaterally; no wheezes, no rhonchi, no rales, no hypoxia or respiratory distress, speaking full sentences ABD/GI: Normal bowel sounds; non-distended; soft, non-tender, no rebound, no guarding, no peritoneal signs BACK: The back appears normal EXT: Normal ROM in all joints; no deformity noted, no edema; no cyanosis, right upper extremity is in an Ace wrap SKIN: Normal color for age and race; warm; no rash on exposed skin NEURO: Moves all extremities equally, normal speech, no drift, sensation to light touch intact diffusely, cranial nerves II through XII intact, mild dysmetria to finger-nose testing bilaterally, gait testing deferred PSYCH: The patient's mood and manner are appropriate.   ED Results / Procedures / Treatments   LABS: (all labs ordered are listed, but only abnormal results are displayed) Labs Reviewed  BASIC METABOLIC PANEL - Abnormal; Notable for the following components:      Result Value   CO2 21 (*)    Glucose, Bld 241 (*)    BUN 37 (*)    Creatinine, Ser 1.56 (*)    Calcium 8.5 (*)    GFR, Estimated 33 (*)    All other components within normal limits  CBC - Abnormal; Notable for the following components:   WBC 11.9 (*)    RBC 3.35 (*)    Hemoglobin 9.5 (*)    HCT 29.1 (*)    RDW 16.0 (*)    Platelets 131 (*)  All other components within normal limits  LIPID PANEL - Abnormal; Notable for the  following components:   Triglycerides 198 (*)    HDL 30 (*)    All other components within normal limits  RESP PANEL BY RT-PCR (FLU A&B, COVID) ARPGX2  URINALYSIS, ROUTINE W REFLEX MICROSCOPIC  PROTIME-INR  APTT  HEMOGLOBIN A1C  CBG MONITORING, ED     EKG:  EKG Interpretation  Date/Time:  Thursday July 17 2021 03:41:23 EST Ventricular Rate:  68 PR Interval:  162 QRS Duration: 94 QT Interval:  434 QTC Calculation: 461 R Axis:   17 Text Interpretation: Normal sinus rhythm Nonspecific ST and T wave abnormality Abnormal ECG When compared with ECG of 09-Mar-2021 16:56, No significant change was found Confirmed by Pryor Curia [phone]) on 07/17/2021 7:27:16 AM         RADIOLOGY: My personal review and interpretation of imaging: CT head and MRI brain concerning for cerebellar infarcts.  I have personally reviewed all radiology reports.   CT Head Wo Contrast  Result Date: 07/17/2021 CLINICAL DATA:  Nonspecific dizziness EXAM: CT HEAD WITHOUT CONTRAST TECHNIQUE: Contiguous axial images were obtained from the base of the skull through the vertex without intravenous contrast. RADIATION DOSE REDUCTION: This exam was performed according to the departmental dose-optimization program which includes automated exposure control, adjustment of the mA and/or kV according to patient size and/or use of iterative reconstruction technique. COMPARISON:  03/09/2021 FINDINGS: Brain: 2 small right cerebellar infarcts not seen on prior, possibly recent and symptomatic. Few remote supratentorial white matter insults. No hemorrhage, hydrocephalus, or collection. Age normal brain volume. Vascular: No hyperdense vessel or unexpected calcification. Skull: Normal. Negative for fracture or focal lesion. Sinuses/Orbits: No acute finding. 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 Electronically Signed   By: Jorje Guild M.D.   On:  07/17/2021 04:32   MR BRAIN WO CONTRAST  Result Date: 07/17/2021 CLINICAL DATA:  Nonspecific dizziness. EXAM: MRI HEAD WITHOUT CONTRAST TECHNIQUE: Multiplanar, multiecho pulse sequences of the brain and surrounding structures were obtained without intravenous contrast. COMPARISON:  Head CT from earlier today FINDINGS: Brain: Patchy acute infarcts in the bilateral cerebellum and bilateral frontal, parietal, and occipital convexities. Patchy acute infarct in the right more than left centrum semiovale and in the left caudate head. Mild petechial hemorrhage at the right occipital cortex. No hematoma, hydrocephalus, or collection. Vascular: Normal flow voids Skull and upper cervical spine: New scattered bone lesions in the C2 right articular process, right para median clivus tip, and in the bilateral calvarium, affected areas marked on sagittal T2 weighted imaging. Sinuses/Orbits: Negative IMPRESSION: 1. Numerous small acute infarcts scattered in the brain and compatible with central embolic disease. 2. 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 Recommend metastatic workup. Electronically Signed   By: Jorje Guild M.D.   On: 07/17/2021 05:42     PROCEDURES:  Critical Care performed: Yes, see critical care procedure note(s)   CRITICAL CARE Performed by: Cyril Mourning Sian Rockers   Total critical care time: 45 minutes  Critical care time was exclusive of separately billable procedures and treating other patients.  Critical care was necessary to treat or prevent imminent or life-threatening deterioration.  Critical care was time spent personally by me on the following activities: development of treatment plan with patient and/or surrogate as well as nursing, discussions with consultants, evaluation of patient's response to treatment, examination of patient, obtaining history from patient or surrogate, ordering  and performing treatments and interventions, ordering and review of laboratory  studies, ordering and review of radiographic studies, pulse oximetry and re-evaluation of patient's condition.   Marland Kitchen1-3 Lead EKG Interpretation Performed by: Channon Brougher, Delice Bison, DO Authorized by: Emmalina Espericueta, Delice Bison, DO     Interpretation: normal     ECG rate:  75   ECG rate assessment: normal     Rhythm: sinus rhythm     Ectopy: none     Conduction: normal      IMPRESSION / MDM / ASSESSMENT AND PLAN / ED COURSE  I reviewed the triage vital signs and the nursing notes.    Patient here with vertigo.  Last seen normal at 4 PM yesterday.  The patient is on the cardiac monitor to evaluate for evidence of arrhythmia and/or significant heart rate changes.   DIFFERENTIAL DIAGNOSIS (includes but not limited to):   CVA, intracranial hemorrhage, peripheral vertigo, otitis media, otitis externa, mastoiditis, anemia, electrolyte derangement, dehydration, UTI   PLAN: We will obtain CBC, BMP, EKG, urinalysis, head CT.  Will give IV fluids, meclizine for symptomatic relief.   MEDICATIONS GIVEN IN ED: Medications  0.9 %  sodium chloride infusion (has no administration in time range)  acetaminophen (TYLENOL) tablet 650 mg (has no administration in time range)    Or  acetaminophen (TYLENOL) suppository 650 mg (has no administration in time range)   stroke: mapping our early stages of recovery book ( Does not apply Not Given 07/17/21 0623)  oxyCODONE (Oxy IR/ROXICODONE) immediate release tablet 5 mg (5 mg Oral Given 07/17/21 0654)  insulin aspart (novoLOG) injection 0-9 Units (has no administration in time range)  insulin aspart (novoLOG) injection 0-5 Units (has no administration in time range)  ondansetron (ZOFRAN) injection 4 mg (has no administration in time range)  hydrALAZINE (APRESOLINE) injection 5 mg (has no administration in time range)  albuterol (PROVENTIL) (2.5 MG/3ML) 0.083% nebulizer solution 3 mL (has no administration in time range)  dextromethorphan-guaiFENesin (MUCINEX DM) 30-600 MG  per 12 hr tablet 1 tablet (has no administration in time range)  meclizine (ANTIVERT) tablet 25 mg (25 mg Oral Given 07/17/21 0608)  aspirin chewable tablet 324 mg (324 mg Oral Given 07/17/21 0608)     ED COURSE: Patient's head CT reviewed by myself and radiologist and is concerning for acute cerebellar infarcts.  Normal hemoglobin, electrolytes.  EKG shows a sinus rhythm.  Will give full dose aspirin.  Will obtain MRI of the brain.  Will discuss with hospitalist for admission.  NIH stroke scale is currently 1 based on mild dysmetria bilaterally, worse in the left upper extremity.  She is outside of the tPA window given last seen normal at 4 PM.   CONSULTS:  Consulted and discussed patient's case with hospitalist, Dr. Velia Meyer.  I have recommended admission and consulting physician agrees and will place admission orders.  Patient (and family if present) agree with this plan.   I reviewed all nursing notes, vitals, pertinent previous records.  All labs, EKGs, imaging ordered have been independently reviewed and interpreted by myself.    OUTSIDE RECORDS REVIEWED: Reviewed patient's op note from Dr. Rudene Christians on 07/15/2021 where she had a right carpal tunnel release.         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   Miklos Bidinger, Delice Bison, DO 07/17/21 [phone]

## 2021-07-17 NOTE — ED Triage Notes (Signed)
Pt arrived to ed via [facility] from home. Ems reports pt got dizzy when attempting to get out of bed this morning.  Pt reports also happened last night. Had surgery for carpel tunnel surgery 2 days ago.Taking hydrocodone for pain. Ems reports pt was orthostatic when they arrived. Initial 147/70 when laying. Dropped to 117/59 when standing. Hx of afib, but NSR with ems. IV in left arm. $RemoveB'4mg'qjbVpkiO$  zofran, and 429ml NS prior to arrival to ed. Cbg 252. Pt a&o x 4. When pt became dizzy pt husband assisted pt to floor. Pt denies hitting head. Reports taking eliquis. NAD noted at this time.

## 2021-07-17 NOTE — Consult Note (Signed)
Neurology Consultation Reason for Consult: Stroke Referring Physician: Mora Bellman  CC: Stroke  History is obtained from: Patient  HPI: Natasha [name] is a 81 y.o. female with a history of atrial flutter on anticoagulation with Eliquis who underwent carpal tunnel release on 2/21 and was off her Eliquis.  She had restarted it the day prior to her symptoms, and then around midnight 12/23 she awoke with vertigo.  She also complains of posterior headache.  She denies vision changes, endorses nausea and vomiting  LKW: 4 PM 2/22 tpa given?: no, out of window   ROS: A ROS was performed and is negative except as noted in the HPI.   Past Medical History:  Diagnosis Date   A-fib [facility])    a.) CHA2DS2-VASc Score = 6 (age x 2, sex, HTN, aortic plaque, T2DM). 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   Chronic cough    CKD (chronic kidney disease), stage III (HCC)    Complication of anesthesia    COPD (chronic obstructive pulmonary disease) (HCC)    Depression    Diastolic dysfunction    a.) TTE 04/08/2014: EF 60%; mild concentric LVH; G2DD. b.) TTE 12/21/2019: EF 60-65%, LA mildly dilated, mild-mod MR; PASP 36.8; G1DD. c.) TTE 02/25/2021: EF 60-65%; normal LV function with mild concentric LVH; G1DD   Diverticulitis    Dyspnea    High cholesterol    History of 2019 novel coronavirus disease (COVID-19) 02/24/2021   History of hiatal hernia    History of kidney stones    Hypertension    Insomnia    Long term current use of anticoagulant    a.) apixaban   Lumbar spinal stenosis    Mild asthma    Murmur    Non-small cell carcinoma of left lung,  stage 1 ([place]) 09/30/2020   a.) clinical stage 1 (cT1cN0cM0). b.) treated with SBRT (60 cGy over 5 fractions).   OSA on CPAP    Osteoporosis    Restless leg    Sepsis ([place])    T2DM (type 2 diabetes mellitus) ([place])      Family History  Problem Relation Age of Onset   Other Mother        died from surgery   AAA (abdominal aortic aneurysm) Mother    Diabetes Father        controlled by diet   Dementia Father        brain atrophy - unknown origin   Breast cancer Cousin        maternal     Social History:  reports that she quit smoking about 46 years ago. Her smoking use included cigarettes. She has a 12.00 pack-year smoking history. She has been exposed to tobacco smoke. She has never used smokeless tobacco. She reports that she does not drink alcohol and does not use drugs.   Exam: Current vital signs: BP 122/60    Pulse 68    Temp 98.4 F (36.9 C) (Oral)    Resp 15    Ht $R'5\' 4"'aB$  (1.626 m)    Wt 62.1 kg    SpO2 90%    BMI 23.52 kg/m  Vital signs in  last 24 hours: Temp:  [98.4 F (36.9 C)] 98.4 F (36.9 C) (02/23 0344) Pulse Rate:  [66-75] 68 (02/23 1400) Resp:  [15-22] 15 (02/23 1400) BP: (119-136)/(60-68) 122/60 (02/23 1400) SpO2:  [90 %-94 %] 90 % (02/23 1400) Weight:  [62.1 kg] 62.1 kg (02/23 0346)   Physical Exam  Constitutional: Appears well-developed and well-nourished.  Psych: Affect appropriate to situation Eyes: No scleral injection HENT: No OP obstruction MSK: no joint deformities.  Cardiovascular: Normal rate and regular rhythm.  Respiratory: Effort normal, non-labored breathing GI: Soft.  No distension. There is no tenderness.  Skin: WDI  Neuro: Mental Status: Patient is awake, alert, oriented to person, place, month, year, and situation. Patient is able to give a clear and coherent history. No signs of aphasia or neglect Cranial Nerves: II: Visual Fields are full. Pupils are equal, round, and reactive to light.   III,IV, VI: EOMI without ptosis or  diploplia.  V: Facial sensation is symmetric to temperature VII: Facial movement is symmetric.  VIII: hearing is intact to voice X: Uvula elevates symmetrically XI: Shoulder shrug is symmetric. XII: tongue is midline without atrophy or fasciculations.  Motor: Tone is normal. Bulk is normal. 5/5 strength was present on the right, she has drift in both the left arm and leg, but good strength to confrontation Sensory: Sensation is symmetric to light touch and temperature in the arms and legs. Cerebellar: She is ataxic in both the left arm and leg    I have reviewed labs in epic and the results pertinent to this consultation are: Creatinine 1.56  I have reviewed the images obtained: MRI brain-multiple small strokes, with a significant posterior circulation predominance.  Impression: 81 year old female with a history of atrial flutter who is on anticoagulation, but this has been held due to surgery with strokes consistent with emboli in the setting of holding Eliquis.  I strongly suspect cardiac source due to holding Eliquis.  Given that she does have a headache, and her strokes are predominantly in the posterior circulation, I would favor vascular imaging to assess the posterior circulation.  In the setting, I would favor holding Eliquis for a few days given the risk of hemorrhagic transformation.  The strokes are small to moderate, but would still hold for now.  Even though she is already anticoagulated, I do think an echo would be helpful because if further clot was visualized on TTE, I would restart anticoagulation sooner.  Recommendations: - HgbA1c, fasting lipid panel - Frequent neuro checks - Echocardiogram - MRA head and neck - Prophylactic therapy-Antiplatelet med: Aspirin - dose 325 mg daily - Risk factor modification - Telemetry monitoring - PT consult, OT consult, Speech consult    Roland Rack, MD [facility] [phone]  If 7pm- 7am, please page  neurology on call as listed in AMION.

## 2021-07-17 NOTE — Progress Notes (Signed)
Inpatient Diabetes Program Recommendations  AACE/ADA: New Consensus Statement on Inpatient Glycemic Control (2015)  Target Ranges:  Prepandial:   less than 140 mg/dL      Peak postprandial:   less than 180 mg/dL (1-2 hours)      Critically ill patients:  140 - 180 mg/dL    Latest Reference Range & Units 07/17/21 08:02  Glucose-Capillary 70 - 99 mg/dL 262 (H)  5 units Novolog       Admit with: CVA  History: DM2  Home DM Meds: Tresiba 15 units QHS       Janumet 50/1000 mg 2 tablets Daily       Jardiance 25 mg daily  Current Orders: Novolog Sensitive Correction Scale/ SSI (0-9 units) TID AC + HS     MD- Note pt takes [place] insulin at home  Please consider starting Semglee 12 units QHS (75% home dose to start)    --Will follow patient during hospitalization--  Wyn Quaker RN, MSN, CDE Diabetes Coordinator Inpatient Glycemic Control Team Team Pager: [phone] (8a-5p)

## 2021-07-17 NOTE — Progress Notes (Signed)
*  PRELIMINARY RESULTS* Echocardiogram 2D Echocardiogram has been performed.  Sherrie Sport 07/17/2021, 1:57 PM

## 2021-07-17 NOTE — Progress Notes (Signed)
SLP Cancellation Note  Patient Details Name: Natasha [name] MRN: [id number] DOB: 05-20-1941   Cancelled treatment:       Reason Eval/Treat Not Completed: SLP screened, no needs identified, will sign off. SLP arriving for cognitive assessment s/p CVA. Pt alert and agreeable to questions. Pt reporting that she is very tired. Pt verbally denied changes to attention, processing, and memory. Pt oriented to self, situation, date, and time and aware of plan expressed to her via the MD. Spouse present for duration of session and in agreement with pt's reports. Education shared re: monitoring for changes in cognitive communication and seeking further intervention via PCP or next level of care. RN reporting pt passed [place] and denied overt cognitive impairments. No further acute SLP needs indicated. Thank you   [place] Jissel Slavens Clapp  MS CCC-SLP  [place] J Clapp 07/17/2021, 10:04 AM

## 2021-07-17 NOTE — Consult Note (Signed)
Hematology/Oncology Consult note [phone] [phone]      Patient Care Team: Lesleigh Noe, MD as PCP - General (Family Medicine) Kate Sable, MD as PCP - Cardiology (Cardiology) Telford Nab, RN as Oncology Nurse Navigator   Name of the patient: Natasha [name]  [id number]  October 25, 1940   Date of visit: 07/17/21 REASON FOR COSULTATION:  Bone lesions, history of lung cancer.  Consult was requested by Dr. Faith Rogue  History of presenting illness-  81 y.o. female with PMH listed at below who presents to ER for evaluation of dizziness.  Patient has experienced sudden onset of dizziness approximately midnight on 07/17/2021.  Dizziness, sensation of room spinning worsened with movement and standing.  Patient was on chronic anticoagulation with Eliquis for atrial flutter and the medication was transaxially held for right sided carpal tunnel release on 07/15/2021.  07/17/2021, CT head without contrast showed 2 small right cerebellar infarcts. 07/17/2021, MRI brain without contrast showed numerous small acute infarcts scattered in the brain and compatible with central embolic disease.  Multiple bone lesions in the C2 right articular process, right paramedian clivus tip, bilateral calvarium. Neurology was consulted and patient was seen by Dr. Leonel Ramsay.  Strokes were felt to be embolic in nature, due to holding Eliquis.  Neurology favor holding Eliquis for few days given the risk of hemorrhagic transformation.  Patient is undergoing stroke work-up.  She is currently on aspirin 325 mg daily.  Patient has a history of stage I lung cancer and oncology was consulted for management.  Discussed with admitting physician Dr. Blaine Hamper and recommend CT chest abdomen pelvis without contrast for further evaluation. 07/17/2021, CT chest abdomen pelvis without contrast showed enlarging left upper lobe/suprahilar mass with associated left hilar and mediastinal adenopathy consistent  with recurrent lung cancer.  New diffuse hepatic metastatic disease.  Scattered subtle slightly sclerotic bone lesions.  Stable right adrenal gland nodule.  Patient is known to me for history of stage I non-small cell lung cancer-adenocarcinoma. 09/04/2020 she was found to have 2.1 x 1.9 cm left upper lobe spiculated mass. EGFR [id number] mutation- exon 21  Patient underwent SBRT to the treatment and followed up with pulmonology and radiology.  Her last CT scan of the chest prior to this admission was in November 2022 in which the left upper lobe lesion slightly decreased in size.  Radiation changes.  Patient denies shortness of breath, abdominal pain, night sweats, fever.  She has some cough with clear phlegm.  4 to 5 pounds weight loss recently.  Review of Systems  Constitutional:  Positive for fatigue and unexpected weight change. Negative for appetite change, chills and fever.  HENT:   Negative for hearing loss and voice change.   Eyes:  Negative for eye problems.  Respiratory:  Negative for chest tightness and cough.   Cardiovascular:  Negative for chest pain.  Gastrointestinal:  Negative for abdominal distention, abdominal pain and blood in stool.  Endocrine: Negative for hot flashes.  Genitourinary:  Negative for difficulty urinating and frequency.   Musculoskeletal:  Negative for arthralgias.  Skin:  Negative for itching and rash.  Neurological:  Positive for dizziness and headaches. Negative for extremity weakness.  Hematological:  Negative for adenopathy.  Psychiatric/Behavioral:  Negative for confusion.    Allergies  Allergen Reactions   Atorvastatin     Muscle/joint aches   Lantus [Insulin Glargine] Hives   Lyrica [Pregabalin] Other (See Comments)    Headache, disorientation   Lisinopril Hives and Cough  Patient Active Problem List   Diagnosis Date Noted   Acute ischemic stroke ([place]) 07/17/2021   HLD (hyperlipidemia) 07/17/2021   Type II diabetes mellitus with  renal manifestations ([place]) 07/17/2021   COPD (chronic obstructive pulmonary disease) ([place]) 07/17/2021   CKD (chronic kidney disease), stage IV (HCC) 07/17/2021   Chronic diastolic CHF (congestive heart failure) ([place]) 07/17/2021   Leukocytosis 07/17/2021   Bone lesion 21/19/4174   Acute embolic stroke ([place]) 01/05/4817   Lung cancer ([place]) 07/17/2021   Carpal tunnel syndrome on right 07/17/2021   Vitamin B12 deficiency 07/14/2021   Localized swelling, mass and lump, neck 07/14/2021   Elevated sed rate 07/14/2021   Vitamin D deficiency 07/14/2021   CKD (chronic kidney disease) stage 4, GFR 15-29 ml/min (HCC) 06/24/2021   Polyarthralgia 06/16/2021   Muscle weakness 06/16/2021   Hyperkalemia 06/16/2021   Unsteadiness on feet 06/16/2021   Acute leg pain, right 06/16/2021   Double vision 03/13/2021   Dizziness 03/13/2021   COVID-19 virus infection 02/24/2021   Atrial flutter ([place]) 02/24/2021   Chronic bilateral low back pain with bilateral sciatica 02/17/2021   Decreased sensation 11/27/2020   Dermatitis 11/27/2020   Axillary lymphadenopathy 10/11/2020   Cancer of upper lobe of left lung ([place]) 10/11/2020   Goals of care, counseling/discussion 10/11/2020   Status post reverse total shoulder replacement, 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   Diverticulitis of colon 02/03/2020   Chronic pain syndrome 01/12/2019   Neuropathic pain 01/12/2019   At high risk for falls 11/02/2018   Neuroforaminal stenosis of lumbar spine 11/02/2018   Sacroiliitis ([place]) 11/02/2018   Former smoker 09/23/2018   Spinal stenosis of lumbar region without neurogenic claudication 09/23/2018   Weakness of both hands 09/23/2018   Overweight (BMI 25.0-29.9) 08/10/2018   Insomnia 06/17/2018   Chronic kidney disease with symptom management only, stage 3 (moderate) ([place]) 05/27/2018   Nonrheumatic aortic valve stenosis 05/24/2018   Iron deficiency anemia 12/24/2017   Paroxysmal atrial fibrillation ([place]) 12/24/2017   Neuropathy of right  lower extremity 11/24/2017   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/2017   Gastroesophageal reflux disease 11/17/2016   OSA (obstructive sleep apnea) 07/24/2016   Diverticulosis 07/21/2016   Osteopenia of multiple sites 06/04/2016   Closed compression fracture of thoracic vertebra ([place]) 04/02/2015   Mixed hyperlipidemia 04/02/2015   Restless leg syndrome 04/02/2015   Closed fracture of lateral portion of left tibial plateau 04/06/2014   Essential hypertension 04/06/2014   Mild intermittent asthma without complication 56/31/4970   Type 2 diabetes mellitus with other specified complication ([place]) 26/37/8588     Past Medical History:  Diagnosis Date   A-fib ([place])    a.) CHA2DS2-VASc Score = 6 (age x 2, sex, HTN, aortic plaque, T2DM). 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   Chronic cough    CKD (chronic kidney disease), stage III (HCC)    Complication of anesthesia    COPD (chronic obstructive pulmonary disease) (HCC)    Depression    Diastolic dysfunction    a.) TTE 04/08/2014: EF 60%; mild concentric LVH; G2DD. b.) TTE 12/21/2019: EF 60-65%, LA mildly dilated, mild-mod MR; PASP 36.8; G1DD. c.) TTE 02/25/2021: EF 60-65%; normal LV  function with mild concentric LVH; G1DD   Diverticulitis    Dyspnea    High cholesterol    History of 2019 novel coronavirus disease (COVID-19) 02/24/2021   History of hiatal hernia    History of kidney stones    Hypertension    Insomnia    Long term current use of anticoagulant    a.) apixaban   Lumbar spinal stenosis    Mild asthma    Murmur    Non-small cell carcinoma of left lung, stage 1 ([place])  09/30/2020   a.) clinical stage 1 (cT1cN0cM0). b.) treated with SBRT (60 cGy over 5 fractions).   OSA on CPAP    Osteoporosis    Restless leg    Sepsis ([place])    T2DM (type 2 diabetes mellitus) ([place])      Past Surgical History:  Procedure Laterality Date   ABDOMINAL HYSTERECTOMY  1978   ANTERIOR INTEROSSEOUS NERVE DECOMPRESSION Right 07/15/2021   Procedure: Cubital tunnel release;  Surgeon: Hessie Knows, MD;  Location: [facility] ORS;  Service: Orthopedics;  Laterality: Right;   APPENDECTOMY  1978   BREAST BIOPSY Left ?   papilloma   BREAST CYST EXCISION Bilateral yrs ago   benign, scars not well visualized   BREAST SURGERY     CARPAL TUNNEL RELEASE Left 04/22/2021   Procedure: Left carpal tunnel release & ulnar nerve release at elbow;  Surgeon: Hessie Knows, MD;  Location: [facility] OR[facility];  Service: Orthopedics;  Laterality: Left;   CARPAL TUNNEL RELEASE Right 07/15/2021   Procedure: CARPAL TUNNEL RELEASE;  Surgeon: Hessie Knows, MD;  Location: [facility] OR[facility];  Service: Orthopedics;  Laterality: Right;   CATARACT EXTRACTION W/ INTRAOCULAR LENS  IMPLANT, BILATERAL Bilateral    CHOLECYSTECTOMY     COLONOSCOPY     ELBOW SURGERY Right    [place] release   EYE SURGERY     FRACTURE SURGERY     GASTRIC BYPASS  12/22/2017   [place]   JOINT REPLACEMENT     KNEE SURGERY Left    tibial fracture with metal plate   TOTAL SHOULDER REPLACEMENT Left 2014   ULNAR TUNNEL RELEASE Left 04/22/2021   Procedure: CUBITAL TUNNEL RELEASE;  Surgeon: Hessie Knows, MD;  Location: [facility];  Service: Orthopedics;  Laterality: Left;   VIDEO BRONCHOSCOPY WITH ENDOBRONCHIAL NAVIGATION N/A 09/30/2020   Procedure: ROBOTIC ASSISTED VIDEO BRONCHOSCOPY WITH ENDOBRONCHIAL NAVIGATION;  Surgeon: Tyler Pita, MD;  Location: [facility];  Service: Pulmonary;  Laterality: N/A;   WRIST SURGERY Left    fractures    Social History   Socioeconomic History   Marital status: Married    Spouse name: Scientist, physiological   Number of children:  1   Years of education: some college   Highest education level: Not on file  Occupational History   Not on file  Tobacco Use   Smoking status: Former    Packs/day: 1.00    Years: 12.00    Pack years: 12.00    Types: Cigarettes    Quit date: 05/26/1975    Years since quitting: 46.1    Passive exposure: Past   Smokeless tobacco: Never  Vaping Use   Vaping Use: Never used  Substance and Sexual Activity   Alcohol use: No    Comment: rarely   Drug use: Never   Sexual activity: Not Currently  Other Topics Concern   Not on file  Social History Narrative   07/22/20   [place]   Living:  with husband, Marlou Sa [phone])   Work: retired - high end Journalist, newspaper      Family: grandson - Ovid Curd 24-Jul-1998) (son is deceased) - and living with them      Enjoys: Enjoys Social worker, going to art shows, gardening and painting      Exercise: not currently   Diet: does not follow diabetic diet      Safety   Seat belts: Yes    Guns: Yes  and secure   Safe in relationships: Yes    Social Determinants of Radio broadcast assistant Strain: Not on file  Food Insecurity: Not on file  Transportation Needs: Not on file  Physical Activity: Not on file  Stress: Not on file  Social Connections: Not on file  Intimate Partner Violence: Not on file     Family History  Problem Relation Age of Onset   Other Mother        died from surgery   AAA (abdominal aortic aneurysm) Mother    Diabetes Father        controlled by diet   Dementia Father        brain atrophy - unknown origin   Breast cancer Cousin        maternal     Current Facility-Administered Medications:     stroke: mapping our early stages of recovery book, , Does not apply, Once, Howerter, Justin B, DO   acetaminophen (TYLENOL) tablet 650 mg, 650 mg, Oral, Q6H PRN, Ivor Costa, MD, 650 mg at 07/17/21 1431   amiodarone (PACERONE) tablet 400 mg, 400 mg, Oral, Daily, Ivor Costa, MD, 400 mg at  07/17/21 1214   [START ON 07/18/2021] aspirin EC tablet 325 mg, 325 mg, Oral, Daily, Leonel Ramsay, Vida Roller, MD   calcium-vitamin D (OSCAL WITH D) 500-5 MG-MCG per tablet 1 tablet, 1 tablet, Oral, Daily, Ivor Costa, MD, 1 tablet at 07/17/21 1214   chlorhexidine (PERIDEX) 0.12 % solution 5 mL, 5 mL, Mouth/Throat, BID, Ivor Costa, MD   dextromethorphan-guaiFENesin (Logan DM) 30-600 MG per 12 hr tablet 1 tablet, 1 tablet, Oral, BID PRN, Ivor Costa, MD   enoxaparin (LOVENOX) injection 30 mg, 30 mg, Subcutaneous, Q24H, Ivor Costa, MD   ferrous gluconate (FERGON) tablet 324 mg, 324 mg, Oral, q AM, Ivor Costa, MD, 324 mg at 07/17/21 1214   gabapentin (NEURONTIN) capsule 300 mg, 300 mg, Oral, QHS, Ivor Costa, MD   hydrALAZINE (APRESOLINE) injection 5 mg, 5 mg, Intravenous, Q2H PRN, Ivor Costa, MD   HYDROcodone-acetaminophen (NORCO) 7.5-325 MG per tablet 1 tablet, 1 tablet, Oral, Q6H PRN, Ivor Costa, MD   insulin aspart (novoLOG) injection 0-5 Units, 0-5 Units, Subcutaneous, QHS, Ivor Costa, MD   insulin aspart (novoLOG) injection 0-9 Units, 0-9 Units, Subcutaneous, TID WC, Ivor Costa, MD, 2 Units at 07/17/21 1719   levalbuterol (XOPENEX) nebulizer solution 1.25 mg, 1.25 mg, Inhalation, Q8H PRN, Ivor Costa, MD   metoprolol succinate (TOPROL-XL) 24 hr tablet 25 mg, 25 mg, Oral, Daily, Ivor Costa, MD, 25 mg at 07/17/21 1214   ondansetron (ZOFRAN) injection 4 mg, 4 mg, Intravenous, Q8H PRN, Ivor Costa, MD   oxyCODONE (Oxy IR/ROXICODONE) immediate release tablet 5 mg, 5 mg, Oral, Q6H PRN, Howerter, Justin B, DO, 5 mg at 07/17/21 0654   pantoprazole (PROTONIX) EC tablet 40 mg, 40 mg, Oral, Daily, Ivor Costa, MD, 40 mg at 07/17/21 1214   pramipexole (MIRAPEX) tablet 1 mg, 1 mg, Oral, QHS, Ivor Costa, MD   rosuvastatin (CRESTOR) tablet  20 mg, 20 mg, Oral, QHS, Niu, Xilin, MD   senna-docusate (Senokot-S) tablet 1 tablet, 1 tablet, Oral, QHS PRN, Ivor Costa, MD   sertraline (ZOLOFT) tablet 50 mg, 50 mg, Oral,  QHS, Ivor Costa, MD   Physical exam:  Vitals:   07/17/21 1000 07/17/21 1200 07/17/21 1400 07/17/21 1606  BP: 136/65 131/66 122/60 124/64  Pulse: 75 67 68 67  Resp: _0 Temp:    98.5 F (36.9 C)  TempSrc:      SpO2: 94% 92% 90% 95%  Weight:      Height:       Physical Exam Constitutional:      General: She is not in acute distress.    Appearance: She is not diaphoretic.  HENT:     Head: Normocephalic and atraumatic.     Nose: Nose normal.     Mouth/Throat:     Pharynx: No oropharyngeal exudate.  Eyes:     General: No scleral icterus.    Pupils: Pupils are equal, round, and reactive to light.  Cardiovascular:     Rate and Rhythm: Normal rate and regular rhythm.     Heart sounds: No murmur heard. Pulmonary:     Effort: Pulmonary effort is normal. No respiratory distress.     Breath sounds: No rales.  Chest:     Chest wall: No tenderness.  Abdominal:     General: There is no distension.     Palpations: Abdomen is soft.     Tenderness: There is no abdominal tenderness.  Musculoskeletal:        General: Normal range of motion.     Cervical back: Normal range of motion and neck supple.  Skin:    General: Skin is warm and dry.     Findings: No erythema.  Neurological:     Mental Status: She is alert and oriented to person, place, and time.     Motor: No abnormal muscle tone.  Psychiatric:        Mood and Affect: Mood and affect normal.        CMP Latest Ref Rng & Units 07/17/2021  Glucose 70 - 99 mg/dL 241(H)  BUN 8 - 23 mg/dL 37(H)  Creatinine 0.44 - 1.00 mg/dL 1.56(H)  Sodium 135 - 145 mmol/L 135  Potassium 3.5 - 5.1 mmol/L 4.7  Chloride 98 - 111 mmol/L 103  CO2 22 - 32 mmol/L 21(L)  Calcium 8.9 - 10.3 mg/dL 8.5(L)  Total Protein 6.0 - 8.3 g/dL -  Total Bilirubin 0.2 - 1.2 mg/dL -  Alkaline Phos 39 - 117 U/L -  AST 0 - 37 U/L -  ALT 0 - 35 U/L -   CBC Latest Ref Rng & Units 07/17/2021  WBC 4.0 - 10.5 K/uL 11.9(H)  Hemoglobin 12.0 - 15.0 g/dL  9.5(L)  Hematocrit 36.0 - 46.0 % 29.1(L)  Platelets 150 - 400 K/uL 131(L)    RADIOGRAPHIC STUDIES: I have personally reviewed the radiological images as listed and agreed with the findings in the report. CT Head Wo Contrast  Result Date: 07/17/2021 CLINICAL DATA:  Nonspecific dizziness EXAM: CT HEAD WITHOUT CONTRAST TECHNIQUE: Contiguous axial images were obtained from the base of the skull through the vertex without intravenous contrast. RADIATION DOSE REDUCTION: This exam was performed according to the departmental dose-optimization program which includes automated exposure control, adjustment of the mA and/or kV according to patient size and/or use of iterative reconstruction technique. COMPARISON:  03/09/2021 FINDINGS: Brain: 2 small right  cerebellar infarcts not seen on prior, possibly recent and symptomatic. Few remote supratentorial white matter insults. No hemorrhage, hydrocephalus, or collection. Age normal brain volume. Vascular: No hyperdense vessel or unexpected calcification. Skull: Normal. Negative for fracture or focal lesion. Sinuses/Orbits: No acute finding. 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 Electronically Signed   By: Jorje Guild M.D.   On: 07/17/2021 04:32   MR BRAIN WO CONTRAST  Result Date: 07/17/2021 CLINICAL DATA:  Nonspecific dizziness. EXAM: MRI HEAD WITHOUT CONTRAST TECHNIQUE: Multiplanar, multiecho pulse sequences of the brain and surrounding structures were obtained without intravenous contrast. COMPARISON:  Head CT from earlier today FINDINGS: Brain: Patchy acute infarcts in the bilateral cerebellum and bilateral frontal, parietal, and occipital convexities. Patchy acute infarct in the right more than left centrum semiovale and in the left caudate head. Mild petechial hemorrhage at the right occipital cortex. No hematoma, hydrocephalus, or collection. Vascular: Normal flow voids Skull and  upper cervical spine: New scattered bone lesions in the C2 right articular process, right para median clivus tip, and in the bilateral calvarium, affected areas marked on sagittal T2 weighted imaging. Sinuses/Orbits: Negative IMPRESSION: 1. Numerous small acute infarcts scattered in the brain and compatible with central embolic disease. 2. 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 Recommend metastatic workup. Electronically Signed   By: Jorje Guild M.D.   On: 07/17/2021 05:42   MR Knee Right w/o contrast  Result Date: 06/29/2021 CLINICAL DATA:  Right knee pain for 2 weeks EXAM: MRI OF THE RIGHT KNEE WITHOUT CONTRAST TECHNIQUE: Multiplanar, multisequence MR imaging of the knee was performed. No intravenous contrast was administered. COMPARISON:  None. FINDINGS: MENISCI Medial: Degeneration of the posterior horn of the medial meniscus. No discrete tear. Lateral: Degeneration of the body of the lateral meniscus with a linear component which does not extend to the articular surface. No discrete tear extending to the articular surface. LIGAMENTS Cruciates: ACL and PCL are intact. Collaterals: Medial collateral ligament is intact. Lateral collateral ligament complex is intact. CARTILAGE Patellofemoral: Partial-thickness cartilage loss with areas of full-thickness cartilage loss of the lateral patellar facet with subchondral reactive marrow changes. Partial-thickness cartilage loss of the medial patellofemoral compartment. Medial:  No chondral defect. Lateral: Partial-thickness cartilage loss of the weight-bearing surface of the lateral femoral condyle. JOINT: No joint effusion. Normal Hoffa's fat-pad. No plical thickening. POPLITEAL FOSSA: Popliteus tendon is intact. No Baker's cyst. EXTENSOR MECHANISM: Intact quadriceps tendon. Intact patellar tendon. Intact lateral patellar retinaculum. Intact medial patellar retinaculum. Intact MPFL. BONES: No aggressive osseous lesion. No fracture or  dislocation. Other: No fluid collection or hematoma. Muscles are normal. IMPRESSION: 1. Partial-thickness cartilage loss with areas of full-thickness cartilage loss of the lateral patellar facet with subchondral reactive marrow changes. Partial-thickness cartilage loss of the medial patellofemoral compartment. 2. Partial-thickness cartilage loss of the weight-bearing surface of the lateral femoral condyle. 3. Degeneration of the body of the lateral meniscus with a linear component which does not extend to the articular surface. No discrete meniscal tear extending to the articular surface. Electronically Signed   By: Kathreen Devoid M.D.   On: 06/29/2021 10:09   ECHOCARDIOGRAM COMPLETE  Result Date: 07/17/2021    ECHOCARDIOGRAM REPORT   Patient Name:   [facility] Date of Exam: 07/17/2021 Medical Rec #:  [id number]     Height:       64.0 in Accession #:    [id number]    Weight:  137.0 lb Date of Birth:  1940/12/30     BSA:          1.666 m Patient Age:    23 years      BP:           131/66 mmHg Patient Gender: F             HR:           67 bpm. Exam Location:  [facility] Procedure: 2D Echo, Cardiac Doppler and Color Doppler Indications:     Stroke I63.9  History:         Patient has prior history of Echocardiogram examinations, most                  recent 02/25/2021. COPD, Arrythmias:Atrial Fibrillation;                  Signs/Symptoms:Murmur.  Sonographer:     Sherrie Sport Referring Phys:  [id number] Ivor Costa Diagnosing Phys: Ida Rogue MD  Sonographer Comments: No parasternal window and suboptimal apical window. Image acquisition challenging due to COPD. IMPRESSIONS  1. Left ventricular ejection fraction, by estimation, is 60 to 65%. The left ventricle has normal function. The left ventricle has no regional wall motion abnormalities. Left ventricular diastolic parameters are consistent with Grade I diastolic dysfunction (impaired relaxation).  2. Right ventricular systolic function is normal. The right ventricular  size is normal. There is mildly elevated pulmonary artery systolic pressure. The estimated right ventricular systolic pressure is 94.8 mmHg.  3. The mitral valve is normal in structure. No evidence of mitral valve regurgitation. No evidence of mitral stenosis.  4. The aortic valve is normal in structure. Aortic valve regurgitation is mild to moderate. No aortic stenosis is present.  5. The inferior vena cava is normal in size with greater than 50% respiratory variability, suggesting right atrial pressure of 3 mmHg. FINDINGS  Left Ventricle: Left ventricular ejection fraction, by estimation, is 60 to 65%. The left ventricle has normal function. The left ventricle has no regional wall motion abnormalities. The left ventricular internal cavity size was normal in size. There is  no left ventricular hypertrophy. Left ventricular diastolic parameters are consistent with Grade I diastolic dysfunction (impaired relaxation). Right Ventricle: The right ventricular size is normal. No increase in right ventricular wall thickness. Right ventricular systolic function is normal. There is mildly elevated pulmonary artery systolic pressure. The tricuspid regurgitant velocity is 3.00  m/s, and with an assumed right atrial pressure of 5 mmHg, the estimated right ventricular systolic pressure is 54.6 mmHg. Left Atrium: Left atrial size was normal in size. Right Atrium: Right atrial size was normal in size. Pericardium: There is no evidence of pericardial effusion. Mitral Valve: The mitral valve is normal in structure. Mild mitral annular calcification. No evidence of mitral valve regurgitation. No evidence of mitral valve stenosis. MV peak gradient, 5.9 mmHg. The mean mitral valve gradient is 2.0 mmHg. Tricuspid Valve: The tricuspid valve is normal in structure. Tricuspid valve regurgitation is not demonstrated. No evidence of tricuspid stenosis. Aortic Valve: The aortic valve is normal in structure. Aortic valve regurgitation is mild  to moderate. No aortic stenosis is present. Aortic valve mean gradient measures 2.5 mmHg. Aortic valve peak gradient measures 4.3 mmHg. Aortic valve area, by VTI measures 3.16 cm. Pulmonic Valve: The pulmonic valve was normal in structure. Pulmonic valve regurgitation is not visualized. No evidence of pulmonic stenosis. Aorta: The aortic root is normal in size and structure. Venous: The  inferior vena cava is normal in size with greater than 50% respiratory variability, suggesting right atrial pressure of 3 mmHg. IAS/Shunts: No atrial level shunt detected by color flow Doppler.  LEFT VENTRICLE PLAX 2D LVIDd:         3.54 cm   Diastology LVIDs:         2.34 cm   LV e' medial:    5.22 cm/s LV PW:         0.98 cm   LV E/e' medial:  12.3 LV IVS:        0.89 cm   LV e' lateral:   7.40 cm/s LVOT diam:     2.00 cm   LV E/e' lateral: 8.7 LV SV:         66 LV SV Index:   40 LVOT Area:     3.14 cm  RIGHT VENTRICLE RV Basal diam:  3.40 cm RV S prime:     14.80 cm/s TAPSE (M-mode): 2.7 cm LEFT ATRIUM             Index        RIGHT ATRIUM           Index LA diam:        3.60 cm 2.16 cm/m   RA Area:     18.20 cm LA Vol (A2C):   91.5 ml 54.93 ml/m  RA Volume:   52.90 ml  31.76 ml/m LA Vol (A4C):   62.7 ml 37.64 ml/m LA Biplane Vol: 77.7 ml 46.64 ml/m  AORTIC VALVE AV Area (Vmax):    2.47 cm AV Area (Vmean):   2.53 cm AV Area (VTI):     3.16 cm AV Vmax:           104.05 cm/s AV Vmean:          68.000 cm/s AV VTI:            0.211 m AV Peak Grad:      4.3 mmHg AV Mean Grad:      2.5 mmHg LVOT Vmax:         81.90 cm/s LVOT Vmean:        54.700 cm/s LVOT VTI:          0.212 m LVOT/AV VTI ratio: 1.00  AORTA Ao Root diam: 2.50 cm MITRAL VALVE                TRICUSPID VALVE MV Area (PHT): 2.76 cm     TR Peak grad:   36.0 mmHg MV Area VTI:   2.13 cm     TR Vmax:        300.00 cm/s MV Peak grad:  5.9 mmHg MV Mean grad:  2.0 mmHg     SHUNTS MV Vmax:       1.21 m/s     Systemic VTI:  0.21 m MV Vmean:      72.7 cm/s    Systemic  Diam: 2.00 cm MV Decel Time: 275 msec MV E velocity: 64.30 cm/s MV A velocity: 117.00 cm/s MV E/A ratio:  0.55 Ida Rogue MD Electronically signed by Ida Rogue MD Signature Date/Time: 07/17/2021/4:35:51 PM    Final    Pulmonary Function Test [facility] Only  Result Date: 06/23/2021 Spirometry Data Is Acceptable and Reproducible Consider Mild Obstructive Airways Disease Consider outpatient Pulmonary Consultation if needed Clinical Correlation Advised  XR Knee 1-2 Views Right  Result Date: 06/21/2021 AP lateral radiographs right knee reviewed.  No acute fracture.  Alignment  intact.  No significant arthritis or joint space narrowing.  Patella height normal relative to distal femur  CT CHEST ABDOMEN PELVIS WO CONTRAST  Result Date: 07/17/2021 CLINICAL DATA:  Bone lesions seen on brain MRI. Evaluate for underlying malignancy. History of lung cancer. EXAM: CT CHEST, ABDOMEN AND PELVIS WITHOUT CONTRAST TECHNIQUE: Multidetector CT imaging of the chest, abdomen and pelvis was performed following the standard protocol without IV contrast. RADIATION DOSE REDUCTION: This exam was performed according to the departmental dose-optimization program which includes automated exposure control, adjustment of the mA and/or kV according to patient size and/or use of iterative reconstruction technique. COMPARISON:  Chest CT 04/10/2021 FINDINGS: CT CHEST FINDINGS Cardiovascular: The heart is normal in size. No pericardial effusion. The aorta is normal in caliber. Stable atherosclerotic calcifications. Remarkably no coronary artery calcifications. Mediastinum/Nodes: Progressive left hilar adenopathy the, difficult to measure without contrast. New subcarinal adenopathy with 12.5 mm node on image 28/2. 8.5 mm right paratracheal node on image 20/2. Lungs/Pleura: Enlarging left upper lobe/suprahilar mass measuring approximately 3 cm on image 33/4. Findings consistent with recurrent lung cancer and left hilar and mediastinal  adenopathy. No new pulmonary nodules to suggest pulmonary metastatic disease. Progressive right basilar scarring changes and streaky basilar atelectasis. Musculoskeletal: No breast masses are identified. No supraclavicular adenopathy. A few scattered axillary lymph nodes are stable. Suspect scattered subtle slightly sclerotic bone lesions. CT ABDOMEN PELVIS FINDINGS Hepatobiliary: New diffuse hepatic metastatic disease. Numerous small lesions throughout both lobes of the liver. The largest lesion at the right hepatic dome measures 2.5 cm on image 43/2. The gallbladder is surgically absent. No common bile duct dilatation. Pancreas: No mass, inflammation or ductal dilatation. Spleen: Normal size.  No focal lesions. Adrenals/Urinary Tract: Stable right adrenal gland nodule. No worrisome renal lesions are identified without contrast. 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 No complicating features. The small bowel and colon are grossly normal. Vascular/Lymphatic: Stable atherosclerotic calcifications involving the aorta and iliac arteries but no aneurysm. Small scattered mesenteric and retroperitoneal lymph nodes but no mass or overt adenopathy the. Reproductive: Surgically absent. Other: No pelvic mass or adenopathy. No free pelvic fluid collections. No inguinal mass or adenopathy. No abdominal wall hernia or subcutaneous lesions. Musculoskeletal: No lytic destructive bone lesions. No spinal canal compromise. IMPRESSION: 1. Enlarging left upper lobe/suprahilar mass with associated left hilar and mediastinal adenopathy consistent with recurrent lung cancer. 2. New diffuse hepatic metastatic disease. 3. Suspect scattered subtle slightly sclerotic bone lesions. No lytic or destructive bone lesions. 4. PET-CT may be helpful for accurate staging, if necessary. 5. Stable right adrenal gland nodule. 6. 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 * onc * Aortic Atherosclerosis  (ICD10-I70.0). Electronically Signed   By: Marijo Sanes M.D.   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  I recommend tissue diagnosis with ultrasound-guided liver biopsy.  Patient agrees with the plan.  I placed order. 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 Plan to send NGS on the current specimen.  #Acute embolic stroke Neurology recommendation was reviewed.  Stroke work-up.  Echo, MRI head and neck, neurochecks Currently on aspirin 325 mg daily, eventually will need to resume Eliquis when okayed by neurology.  #Anemia, normocytic, CKD We will check anemia work-up.  Iron TIBC ferritin, B12, folate, myeloma labs, TSH, reticulocyte panel.   Thank you for allowing  me to participate in the care of this patient.   Earlie Server, MD, PhD Hematology Oncology 07/17/21

## 2021-07-17 NOTE — Progress Notes (Signed)
°  Carryover admission to the Day Admitter.  I discussed this case with the EDP, Dr.Ward.  Per these discussions:   This is a 81 year old female with history of paroxysmal atrial fibrillation who is being admitted for acute ischemic stroke.  She is typically on chronic anticoagulation via Eliquis, however, this medication was held transiently leading up to her right-sided carpal tunnel release, which occurred on 07/15/2021.  The patient went to bed at approximately 1600 on 07/16/2021, completely asymptomatic at the time.  She awoke at approximately midnight on 07/17/2021 at which time she reports experiencing new onset vertigo, prompting her to present to [facility] ED, where CT head reportedly showed 2 acute cerebellar infarcts.  Aside from vertigo, she is reportedly without acute focal neurologic deficit at this time.  She has received aspirin.  MRI brain pending.  I have placed an order to admit the patient for overnight observation for further evaluation and management of acute ischemic infarct.  I have placed some preliminary admit orders via the adult multi admission order set.  I placed some additional preliminary orders orders via the acute ischemic infarct order set, including neurochecks per protocol, PT/OT consults, lipid panel, hemoglobin A1c.     Babs Bertin, DO Hospitalist

## 2021-07-17 NOTE — Progress Notes (Signed)
PT Cancellation Note  Patient Details Name: Natasha [name] MRN: [id number] DOB: 18-Nov-1940   Cancelled Treatment:    Reason Eval/Treat Not Completed: Patient at procedure or test/unavailable. Patient off floor for CT per husband. Will re-attempt later.   Eryka Dolinger 07/17/2021, 10:43 AM

## 2021-07-17 NOTE — Evaluation (Addendum)
Occupational Therapy Evaluation Patient Details Name: Natasha [name] MRN: [id number] DOB: 01-Jun-1940 Today's Date: 07/17/2021   History of Present Illness Natasha [name] is a 81 y.o. female with history of atrial fibrillation on Eliquis, CHF, hypertension, hyperlipidemia, diabetes, COPD, chronic kidney disease who presents to the emergency department with complaints of vertigo.  States she went to sleep yesterday around 4 PM and felt fine.  She woke up around midnight to go to the bathroom and felt like the room was spinning.  Symptoms worse with movement and standing. PMH includes: lung Ca, depression   Clinical Impression   Natasha [name] was seen for OT/PT co-evaluation this date. Prior to hospital admission, pt was Independent for mobility and ADLs, husband reports PRN assistance. Pt lives with spouse in home with 3 STE. Pt presents to acute OT demonstrating impaired ADL performance and functional mobility 2/2 decreased activity tolerance, decreased coordination, and functional ROM/balance deficits. Pt currently requires MOD A sup<>sit. MOD A x2 + HHA sit<>stand, heavy L lateral lean for standing marching however unable to coordinate steps. MIN A for UBD seated EOB. Pt would benefit from skilled OT to address noted impairments and functional limitations (see below for any additional details). Upon hospital discharge, recommend AIR to maximize pt safety and return to PLOF.      Recommendations for follow up therapy are one component of a multi-disciplinary discharge planning process, led by the attending physician.  Recommendations may be updated based on patient status, additional functional criteria and insurance authorization.   Follow Up Recommendations  Acute inpatient rehab (3hours/day)    Assistance Recommended at Discharge Frequent or constant Supervision/Assistance  Patient can return home with the following Two people to help with walking and/or transfers;Two people to help with  bathing/dressing/bathroom;Help with stairs or ramp for entrance    Functional Status Assessment  Patient has had a recent decline in their functional status and demonstrates the ability to make significant improvements in function in a reasonable and predictable amount of time.  Equipment Recommendations  Other (comment) (defer to next venue of care)    Recommendations for Other Services       Precautions / Restrictions Precautions Precautions: Fall Precaution Comments: recent carpal tunnel sx R UE Restrictions Weight Bearing Restrictions: No      Mobility Bed Mobility Overal bed mobility: Needs Assistance Bed Mobility: Supine to Sit, Sit to Supine     Supine to sit: Mod assist Sit to supine: Mod assist        Transfers Overall transfer level: Needs assistance Equipment used: 2 person hand held assist Transfers: Sit to/from Stand Sit to Stand: Mod assist, +2 physical assistance           General transfer comment: patient leaning posteriorly on the bed with B LEs. Unable to step. Leaning to her left as well in standing. Patient repors dizziness with standing.      Balance Overall balance assessment: Needs assistance Sitting-balance support: Feet supported Sitting balance-Natasha [name] Scale: Good     Standing balance support: Bilateral upper extremity supported, During functional activity Standing balance-Natasha [name] Scale: Zero Standing balance comment: requires +2 assist for standing balance                           ADL either performed or assessed with clinical judgement   ADL Overall ADL's : Needs assistance/impaired  General ADL Comments: MOD A x2 + HHA for ADL t/f. MIN A for UBD seated EOB.      Pertinent Vitals/Pain Pain Assessment Pain Assessment: Faces Faces Pain Scale: Hurts little more Pain Location: B hips Pain Descriptors / Indicators: Discomfort, Guarding, Grimacing Pain  Intervention(s): Limited activity within patient's tolerance, Repositioned     Hand Dominance     Extremity/Trunk Assessment Upper Extremity Assessment Upper Extremity Assessment: RUE deficits/detail RUE: Unable to fully assess due to immobilization RUE Coordination: decreased gross motor   Lower Extremity Assessment Lower Extremity Assessment: Generalized weakness RLE Coordination: decreased gross motor LLE Coordination: decreased gross motor   Cervical / Trunk Assessment Cervical / Trunk Assessment: Normal   Communication Communication Communication: No difficulties   Cognition Arousal/Alertness: Awake/alert Behavior During Therapy: Flat affect Overall Cognitive Status: Within Functional Limits for tasks assessed                                        Home Living Family/patient expects to be discharged to:: Private residence Living Arrangements: Spouse/significant other Available Help at Discharge: Family;Available 24 hours/day Type of Home: House Home Access: Stairs to enter [employer] of Steps: 3 Entrance Stairs-Rails: None Home Layout: One level               Home Equipment: None          Prior Functioning/Environment Prior Level of Function : Needs assist             Mobility Comments: patient requires assistance from husband for transfers PRN ADLs Comments: independent        OT Problem List: Decreased strength;Decreased activity tolerance;Impaired balance (sitting and/or standing);Decreased range of motion;Decreased safety awareness;Decreased coordination      OT Treatment/Interventions: Self-care/ADL training;Therapeutic exercise;Energy conservation;DME and/or AE instruction;Therapeutic activities;Patient/family education;Balance training;Neuromuscular education    OT Goals(Current goals can be found in the care plan section) Acute Rehab OT Goals Patient Stated Goal: to get better OT Goal Formulation: With  patient/family Time For Goal Achievement: 07/31/21 Potential to Achieve Goals: Good ADL Goals Pt Will Perform Grooming: standing;with min assist Pt Will Perform Lower Body Dressing: with min assist;sit to/from stand Pt Will Transfer to Toilet: ambulating;bedside commode;with min assist  OT Frequency: Min 5X/week    Co-evaluation PT/OT/SLP Co-Evaluation/Treatment: Yes Reason for Co-Treatment: For patient/therapist safety;To address functional/ADL transfers PT goals addressed during session: Balance OT goals addressed during session: ADL's and self-care      AM-PAC OT "6 Clicks" Daily Activity     Outcome Measure Help from another person eating meals?: A Little Help from another person taking care of personal grooming?: A Lot Help from another person toileting, which includes using toliet, bedpan, or urinal?: A Lot Help from another person bathing (including washing, rinsing, drying)?: A Lot Help from another person to put on and taking off regular upper body clothing?: A Little Help from another person to put on and taking off regular lower body clothing?: A Lot 6 Click Score: 14   End of Session Nurse Communication: Mobility status  Activity Tolerance: Patient tolerated treatment well Patient left: in bed;with call bell/phone within reach;with family/visitor present;with nursing/sitter in room  OT Visit Diagnosis: Other abnormalities of gait and mobility (R26.89);Muscle weakness (generalized) (M62.81)                Time: 8250-5397 OT Time Calculation (min): 13 min Charges:  OT General Charges $  OT Visit: 1 Visit OT Evaluation $OT Eval Moderate Complexity: 1 Mod  Dessie Coma, M.S. OTR/L  07/17/21, 12:52 PM  ascom [phone]

## 2021-07-17 NOTE — H&P (Addendum)
History and Physical    Natasha [name] XNT:[id number] DOB: [birth year] DOA: 07/17/2021  Referring MD/NP/PA:   PCP: Lesleigh Noe, MD   Patient coming from:  The patient is coming from home.    Chief Complaint: dizziness  HPI: Natasha [name] is a 81 y.o. female with medical history significant of A Fib on Eliquis (currently on hold), hypertension, hyperlipidemia, diabetes mellitus, COPD, asthma, GERD, depression, former smoker, RLS, OSA, CKD-4, dCHF, lung cancer (s/p of radiation therapy), iron deficiency anemia, chronic pain, who presents with dizziness.  She was last known normal at about 4:00 PM 07/16/21 when she went to sleep and felt fine. Pt states that she awoke at approximately midnight on 07/17/2021 at which time she has new onset dizziness. She felt like the room was spinning. Symptom is worse with movement and standing.  She states that she has very minimal left-leg weakness.  No unilateral numbness or tinglings in extremities.  No facial droop or slurred speech.  No difficulty swallowing.  Denies headache, hearing loss or vision loss. Patient has chronic mild dry cough, which has not changed.  No chest pain, shortness breath.  No nausea, vomiting, diarrhea or abdominal pain.  No symptoms of UTI.  Of note, pt is typically on chronic anticoagulation via Eliquis, however, this medication was held transiently for right-sided carpal tunnel release, which occurred on 07/15/2021.  Data Reviewed and ED Course: pt was found to have WBC 11.9, negative COVID PCR, stable renal function, temperature normal, blood pressure 129/64, heart rate 70, RR 16, oxygen saturation 94% on room air.  Patient is placed on telemetry bed for observation.  Dr. Tasia Catchings of oncology is consulted.  Dr. Saralyn Pilar of neurology are consulted.  CT-head: Two small right cerebellar infarcts since brain MRI October 2022, possibly recent based on the history.  MRI-brain 1. Numerous small acute infarcts scattered in the brain  and compatible with central embolic disease. 2. 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 Recommend metastatic workup.  EKG: I have personally reviewed.  Sinus rhythm, QTc 461, low voltage, borderline left axis deviation   Review of Systems:   General: no fevers, chills, no body weight gain, has fatigue HEENT: no blurry vision, hearing changes or sore throat Respiratory: no dyspnea, has coughing, no wheezing CV: no chest pain, no palpitations GI: no nausea, vomiting, abdominal pain, diarrhea, constipation GU: no dysuria, burning on urination, increased urinary frequency, hematuria  Ext: no leg edema Neuro: no numbness, or tingling, no vision change or hearing loss. Has dizziness and minimal left leg weakness Skin: no rash, no skin tear. MSK: No muscle spasm, no deformity, no limitation of range of movement in spin Heme: No easy bruising.  Travel history: No recent long distant travel.   Allergy:  Allergies  Allergen Reactions   Atorvastatin     Muscle/joint aches   Lantus [Insulin Glargine] Hives   Lyrica [Pregabalin] Other (See Comments)    Headache, disorientation   Lisinopril Hives and Cough         Past Medical History:  Diagnosis Date   A-fib ([place])    a.) CHA2DS2-VASc Score = 6 (age x 2, sex, HTN, aortic plaque, T2DM). 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   Chronic cough    CKD (chronic kidney disease), stage III (HCC)    Complication of anesthesia    COPD (chronic obstructive pulmonary disease) (HCC)    Depression    Diastolic dysfunction    a.) TTE 04/08/2014: EF 60%; mild concentric  LVH; G2DD. b.) TTE 12/21/2019: EF 60-65%, LA mildly dilated, mild-mod MR; PASP 36.8; G1DD. c.) TTE 02/25/2021: EF 60-65%; normal LV function with mild concentric LVH; G1DD   Diverticulitis    Dyspnea    High cholesterol    History of 2019 novel coronavirus disease (COVID-19) 02/24/2021   History of hiatal hernia    History of kidney stones    Hypertension    Insomnia    Long term current use of anticoagulant    a.) apixaban   Lumbar spinal stenosis    Mild asthma    Murmur    Non-small cell carcinoma of left lung, stage 1 ([place]) 09/30/2020   a.) clinical stage 1 (cT1cN0cM0). b.) treated with SBRT (60 cGy over 5 fractions).   OSA on CPAP    Osteoporosis    Restless leg    Sepsis ([place])    T2DM (type 2 diabetes mellitus) ([place])     Past Surgical History:  Procedure Laterality Date   ABDOMINAL HYSTERECTOMY  1978   ANTERIOR INTEROSSEOUS NERVE DECOMPRESSION Right 07/15/2021   Procedure: Cubital tunnel release;  Surgeon: Hessie Knows, MD;  Location: [facility] ORS;  Service: Orthopedics;  Laterality: Right;   APPENDECTOMY  1978   BREAST BIOPSY Left ?   papilloma   BREAST CYST EXCISION Bilateral yrs ago   benign, scars not well visualized   BREAST SURGERY     CARPAL TUNNEL RELEASE Left 04/22/2021   Procedure: Left carpal tunnel release & ulnar nerve release at elbow;  Surgeon: Hessie Knows, MD;  Location: [facility];  Service: Orthopedics;  Laterality: Left;   CARPAL TUNNEL RELEASE Right 07/15/2021   Procedure: CARPAL TUNNEL RELEASE;  Surgeon: Hessie Knows, MD;  Location: [facility] OR[facility];  Service: Orthopedics;  Laterality: Right;   CATARACT EXTRACTION W/ INTRAOCULAR LENS  IMPLANT, BILATERAL Bilateral    CHOLECYSTECTOMY     COLONOSCOPY     ELBOW SURGERY Right    [place] release   EYE SURGERY     FRACTURE SURGERY     GASTRIC BYPASS  12/22/2017   Roux-N-Y   JOINT REPLACEMENT     KNEE SURGERY Left    tibial fracture with metal plate   TOTAL SHOULDER REPLACEMENT Left 2014   ULNAR TUNNEL  RELEASE Left 04/22/2021   Procedure: CUBITAL TUNNEL RELEASE;  Surgeon: Hessie Knows, MD;  Location: [facility];  Service: Orthopedics;  Laterality: Left;   VIDEO BRONCHOSCOPY WITH ENDOBRONCHIAL NAVIGATION N/A 09/30/2020   Procedure: ROBOTIC ASSISTED VIDEO BRONCHOSCOPY WITH ENDOBRONCHIAL NAVIGATION;  Surgeon: Tyler Pita, MD;  Location: [facility];  Service: Pulmonary;  Laterality: N/A;   WRIST SURGERY Left    fractures    Social History:  reports that she quit smoking about 46 years ago. Her smoking use included cigarettes. She has a 12.00 pack-year smoking history. She has been exposed to tobacco smoke. She has never used smokeless tobacco. She reports that she does not drink alcohol and does not use drugs.  Family History:  Family History  Problem Relation Age of Onset   Other Mother        died from surgery   AAA (abdominal aortic aneurysm) Mother  Diabetes Father        controlled by diet   Dementia Father        brain atrophy - unknown origin   Breast cancer Cousin        maternal     Prior to Admission medications   Medication Sig Start Date End Date Taking? Authorizing Provider  amiodarone (PACERONE) 200 MG tablet Take 400 mg by mouth daily. 03/03/21   [provider]  amoxicillin (AMOXIL) 500 MG capsule Take 500 mg by mouth 3 (three) times daily. Take until finished. 07/08/21   [provider]  apixaban (ELIQUIS) 5 MG TABS tablet Take 1 tablet (5 mg total) by mouth 2 (two) times daily. 02/17/21   Lesleigh Noe, MD  Budeson-Glycopyrrol-Formoterol (BREZTRI AEROSPHERE) 160-9-4.8 MCG/ACT AERO Inhale 160 mcg into the lungs 2 (two) times daily. 06/12/21   Tyler Pita, MD  Calcium Carb-Cholecalciferol (CALCIUM 600+D3 PO) Take 1 tablet by mouth in the morning.    [provider]  chlorhexidine (PERIDEX) 0.12 % solution Rinse mouth with 15 ml for 30 seconds in the morning and evening after brushing. Then spit. 07/08/21   [provider]   Ferrous Gluconate (IRON 27 PO) Take 27 mg by mouth in the morning.    [provider]  gabapentin (NEURONTIN) 100 MG capsule Take by mouth 3 (three) times daily. Take one capsule by mouth nightly for one week. Then increase to 2 capsules nightly for one week. Then 3 capsules nightly.    [provider]  glucose blood (CONTOUR NEXT TEST) test strip Check sugar twice daily DX E11.69 02/24/21   Lesleigh Noe, MD  HYDROcodone-acetaminophen (NORCO) 7.5-325 MG tablet Take 1 tablet by mouth every 6 (six) hours as needed for moderate pain. 07/15/21   Hessie Knows, MD  JANUMET 50-1000 MG tablet Take 2 tablets by mouth in the morning. 02/08/17   [provider]  JARDIANCE 25 MG TABS tablet Take 25 mg by mouth daily. 06/13/21   [provider]  levalbuterol Penne Lash HFA) 45 MCG/ACT inhaler Inhale 2 puffs into the lungs every 8 (eight) hours as needed for wheezing or shortness of breath (cough). 06/12/21 06/12/22  Tyler Pita, MD  metoprolol succinate (TOPROL-XL) 25 MG 24 hr tablet Take 25 mg by mouth daily. 03/31/21   [provider]  pantoprazole (PROTONIX) 40 MG tablet Take 40 mg by mouth daily. 07/17/21   [provider]  pramipexole (MIRAPEX) 1 MG tablet Take 1 tablet (1 mg total) by mouth at bedtime. Appt with PCP needed for further refills. 11/27/20   Lesleigh Noe, MD  rosuvastatin (CRESTOR) 20 MG tablet Take 1 tablet (20 mg total) by mouth at bedtime. 01/17/21   Lesleigh Noe, MD  sertraline (ZOLOFT) 50 MG tablet Take 1 tablet (50 mg total) by mouth at bedtime. 06/25/21   Lesleigh Noe, MD  spironolactone (ALDACTONE) 25 MG tablet Take 25 mg by mouth daily. 05/01/21   [provider]  TRESIBA FLEXTOUCH 100 UNIT/ML SOPN FlexTouch Pen Inject 15 Units into the skin at bedtime. 03/26/17   [provider]  Vitamin D, Ergocalciferol, (DRISDOL) 1.25 MG (50000 UNIT) CAPS capsule Take 1 capsule (50,000 Units total) by mouth every 7 (seven)  days. 07/11/21   Meredith Pel, MD    Physical Exam: Vitals:   07/17/21 0900 07/17/21 1000 07/17/21 1200 07/17/21 1400  BP: 133/66 136/65 131/66 122/60  Pulse: 73 75 67 68  Resp: (!) 22 15 15  15  Temp:      TempSrc:      SpO2: 93% 94% 92% 90%  Weight:      Height:       General: Not in acute distress HEENT:       Eyes: PERRL, EOMI, no scleral icterus.       ENT: No discharge from the ears and nose, no pharynx injection, no tonsillar enlargement.        Neck: No JVD, no bruit, no mass felt. Heme: No neck lymph node enlargement. Cardiac: S1/S2, RRR, No murmurs, No gallops or rubs. Respiratory: No rales, wheezing, rhonchi or rubs. GI: Soft, nondistended, nontender, no rebound pain, no organomegaly, BS present. GU: No hematuria Ext: No pitting leg edema bilaterally. 1+DP/PT pulse bilaterally. S/p of right arm carpal tunnel release surgery Musculoskeletal: No joint deformities, No joint redness or warmth, no limitation of ROM in spin. Skin: No rashes.  Neuro: Alert, oriented X3, cranial nerves II-XII grossly intact, moves all extremities normally. Muscle strength 5/5 in all extremities, sensation to light touch intact.  Psych: Patient is not psychotic, no suicidal or hemocidal ideation.  Labs on Admission: I have personally reviewed following labs and imaging studies  CBC: Recent Labs  Lab 07/17/21 0352  WBC 11.9*  HGB 9.5*  HCT 29.1*  MCV 86.9  PLT 528*   Basic Metabolic Panel: Recent Labs  Lab 07/17/21 0352  NA 135  K 4.7  CL 103  CO2 21*  GLUCOSE 241*  BUN 37*  CREATININE 1.56*  CALCIUM 8.5*   GFR: Estimated Creatinine Clearance: 24.4 mL/min (A) (by C-G formula based on SCr of 1.56 mg/dL (H)). Liver Function Tests: No results for input(s): AST, ALT, ALKPHOS, BILITOT, PROT, ALBUMIN in the last 168 hours. No results for input(s): LIPASE, AMYLASE in the last 168 hours. No results for input(s): AMMONIA in the last 168 hours. Coagulation Profile: Recent  Labs  Lab 07/17/21 0352  INR 1.3*   Cardiac Enzymes: No results for input(s): CKTOTAL, CKMB, CKMBINDEX, TROPONINI in the last 168 hours. BNP (last 3 results) No results for input(s): PROBNP in the last 8760 hours. HbA1C: No results for input(s): HGBA1C in the last 72 hours. CBG: Recent Labs  Lab 07/15/21 0941 07/15/21 1219 07/17/21 0802 07/17/21 1144  GLUCAP 140* 130* 262* 221*   Lipid Profile: Recent Labs    07/17/21 0352  CHOL 100  HDL 30*  LDLCALC 30  TRIG 198*  CHOLHDL 3.3   Thyroid Function Tests: No results for input(s): TSH, T4TOTAL, FREET4, T3FREE, THYROIDAB in the last 72 hours. Anemia Panel: No results for input(s): VITAMINB12, FOLATE, FERRITIN, TIBC, IRON, RETICCTPCT in the last 72 hours. Urine analysis:    Component Value Date/Time   COLORURINE YELLOW 06/17/2021 [place] 06/17/2021 1533   LABSPEC 1.025 06/17/2021 1533   PHURINE 6.0 06/17/2021 1533   GLUCOSEU 3+ (A) 06/17/2021 1533   HGBUR NEGATIVE 06/17/2021 1533   KETONESUR NEGATIVE 06/17/2021 1533   PROTEINUR NEGATIVE 06/17/2021 1533   Sepsis Labs: $[id number]$ (procalcitonin:4,lacticidven:4) ) Recent Results (from the past 240 hour(s))  Resp Panel by RT-PCR (Flu A&B, Covid) Nasopharyngeal Swab     Status: None   Collection Time: 07/17/21  6:57 AM   Specimen: Nasopharyngeal Swab; Nasopharyngeal(NP) swabs in vial transport medium  Result Value Ref Range Status   SARS Coronavirus 2 by RT PCR NEGATIVE NEGATIVE Final    Comment: (NOTE) SARS-CoV-2 target nucleic acids are NOT DETECTED.  The SARS-CoV-2 RNA is generally detectable in upper  respiratory specimens during the acute phase of infection. The lowest concentration of SARS-CoV-2 viral copies this assay can detect is 138 copies/mL. A negative result does not preclude SARS-Cov-2 infection and should not be used as the sole basis for treatment or other patient management decisions. A negative result may occur with  improper  specimen collection/handling, submission of specimen other than nasopharyngeal swab, presence of viral mutation(s) within the areas targeted by this assay, and inadequate number of viral copies(<138 copies/mL). A negative result must be combined with clinical observations, patient history, and epidemiological information. The expected result is Negative.  Fact Sheet for Patients:  EntrepreneurPulse.com.au  Fact Sheet for Healthcare Providers:  IncredibleEmployment.be  This test is no t yet approved or cleared by the [place] FDA and  has been authorized for detection and/or diagnosis of SARS-CoV-2 by FDA under an Emergency Use Authorization (EUA). This EUA will remain  in effect (meaning this test can be used) for the duration of the COVID-19 declaration under Section 564(b)(1) of the Act, 21 U.S.C.section 360bbb-3(b)(1), unless the authorization is terminated  or revoked sooner.       Influenza A by PCR NEGATIVE NEGATIVE Final   Influenza B by PCR NEGATIVE NEGATIVE Final    Comment: (NOTE) The Xpert Xpress SARS-CoV-2/FLU/RSV plus assay is intended as an aid in the diagnosis of influenza from Nasopharyngeal swab specimens and should not be used as a sole basis for treatment. Nasal washings and aspirates are unacceptable for Xpert Xpress SARS-CoV-2/FLU/RSV testing.  Fact Sheet for Patients: EntrepreneurPulse.com.au  Fact Sheet for Healthcare Providers: IncredibleEmployment.be  This test is not yet approved or cleared by the [place] FDA and has been authorized for detection and/or diagnosis of SARS-CoV-2 by FDA under an Emergency Use Authorization (EUA). This EUA will remain in effect (meaning this test can be used) for the duration of the COVID-19 declaration under Section 564(b)(1) of the Act, 21 U.S.C. section 360bbb-3(b)(1), unless the authorization is terminated or revoked.  Performed at  [facility], [address] [place],  [address]      Radiological Exams on Admission: CT Head Wo Contrast  Result Date: 07/17/2021 CLINICAL DATA:  Nonspecific dizziness EXAM: CT HEAD WITHOUT CONTRAST TECHNIQUE: Contiguous axial images were obtained from the base of the skull through the vertex without intravenous contrast. RADIATION DOSE REDUCTION: This exam was performed according to the departmental dose-optimization program which includes automated exposure control, adjustment of the mA and/or kV according to patient size and/or use of iterative reconstruction technique. COMPARISON:  03/09/2021 FINDINGS: Brain: 2 small right cerebellar infarcts not seen on prior, possibly recent and symptomatic. Few remote supratentorial white matter insults. No hemorrhage, hydrocephalus, or collection. Age normal brain volume. Vascular: No hyperdense vessel or unexpected calcification. Skull: Normal. Negative for fracture or focal lesion. Sinuses/Orbits: No acute finding. 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 Electronically Signed   By: Jorje Guild M.D.   On: 07/17/2021 04:32   MR BRAIN WO CONTRAST  Result Date: 07/17/2021 CLINICAL DATA:  Nonspecific dizziness. EXAM: MRI HEAD WITHOUT CONTRAST TECHNIQUE: Multiplanar, multiecho pulse sequences of the brain and surrounding structures were obtained without intravenous contrast. COMPARISON:  Head CT from earlier today FINDINGS: Brain: Patchy acute infarcts in the bilateral cerebellum and bilateral frontal, parietal, and occipital convexities. Patchy acute infarct in the right more than left centrum semiovale and in the left caudate head. Mild petechial hemorrhage at the right occipital cortex. No hematoma,  hydrocephalus, or collection. Vascular: Normal flow voids Skull and upper cervical spine: New scattered bone lesions in the C2 right articular process, right para median  clivus tip, and in the bilateral calvarium, affected areas marked on sagittal T2 weighted imaging. Sinuses/Orbits: Negative IMPRESSION: 1. Numerous small acute infarcts scattered in the brain and compatible with central embolic disease. 2. 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 Recommend metastatic workup. Electronically Signed   By: Jorje Guild M.D.   On: 07/17/2021 05:42   CT CHEST ABDOMEN PELVIS WO CONTRAST  Result Date: 07/17/2021 CLINICAL DATA:  Bone lesions seen on brain MRI. Evaluate for underlying malignancy. History of lung cancer. EXAM: CT CHEST, ABDOMEN AND PELVIS WITHOUT CONTRAST TECHNIQUE: Multidetector CT imaging of the chest, abdomen and pelvis was performed following the standard protocol without IV contrast. RADIATION DOSE REDUCTION: This exam was performed according to the departmental dose-optimization program which includes automated exposure control, adjustment of the mA and/or kV according to patient size and/or use of iterative reconstruction technique. COMPARISON:  Chest CT 04/10/2021 FINDINGS: CT CHEST FINDINGS Cardiovascular: The heart is normal in size. No pericardial effusion. The aorta is normal in caliber. Stable atherosclerotic calcifications. Remarkably no coronary artery calcifications. Mediastinum/Nodes: Progressive left hilar adenopathy the, difficult to measure without contrast. New subcarinal adenopathy with 12.5 mm node on image 28/2. 8.5 mm right paratracheal node on image 20/2. Lungs/Pleura: Enlarging left upper lobe/suprahilar mass measuring approximately 3 cm on image 33/4. Findings consistent with recurrent lung cancer and left hilar and mediastinal adenopathy. No new pulmonary nodules to suggest pulmonary metastatic disease. Progressive right basilar scarring changes and streaky basilar atelectasis. Musculoskeletal: No breast masses are identified. No supraclavicular adenopathy. A few scattered axillary lymph nodes are stable. Suspect  scattered subtle slightly sclerotic bone lesions. CT ABDOMEN PELVIS FINDINGS Hepatobiliary: New diffuse hepatic metastatic disease. Numerous small lesions throughout both lobes of the liver. The largest lesion at the right hepatic dome measures 2.5 cm on image 43/2. The gallbladder is surgically absent. No common bile duct dilatation. Pancreas: No mass, inflammation or ductal dilatation. Spleen: Normal size.  No focal lesions. Adrenals/Urinary Tract: Stable right adrenal gland nodule. No worrisome renal lesions are identified without contrast. 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 No complicating features. The small bowel and colon are grossly normal. Vascular/Lymphatic: Stable atherosclerotic calcifications involving the aorta and iliac arteries but no aneurysm. Small scattered mesenteric and retroperitoneal lymph nodes but no mass or overt adenopathy the. Reproductive: Surgically absent. Other: No pelvic mass or adenopathy. No free pelvic fluid collections. No inguinal mass or adenopathy. No abdominal wall hernia or subcutaneous lesions. Musculoskeletal: No lytic destructive bone lesions. No spinal canal compromise. IMPRESSION: 1. Enlarging left upper lobe/suprahilar mass with associated left hilar and mediastinal adenopathy consistent with recurrent lung cancer. 2. New diffuse hepatic metastatic disease. 3. Suspect scattered subtle slightly sclerotic bone lesions. No lytic or destructive bone lesions. 4. PET-CT may be helpful for accurate staging, if necessary. 5. Stable right adrenal gland nodule. 6. 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 * onc * Aortic Atherosclerosis (ICD10-I70.0). Electronically Signed   By: Marijo Sanes M.D.   On: 07/17/2021 10:58      Assessment/Plan Principal Problem:   Acute embolic stroke [facility]) Active Problems:   Essential hypertension   Iron deficiency anemia   OSA (obstructive sleep apnea)   Paroxysmal atrial fibrillation (HCC)    Restless leg syndrome   CKD (chronic kidney disease) stage 4, GFR 15-29 ml/min (HCC)  HLD (hyperlipidemia)   Type II diabetes mellitus with renal manifestations (HCC)   COPD (chronic obstructive pulmonary disease) (HCC)   Chronic diastolic CHF (congestive heart failure) (HCC)   Leukocytosis   Bone lesion   Lung cancer (HCC)   Carpal tunnel syndrome on right   Acute embolic stroke:  MRI-brain showed numerous small acute infarcts scattered in the brain and compatible with central embolic disease.  Patient was on Eliquis for A-fib which is on hold for recent carpal tunnel release procedure. Consulted Dr. Leonel Ramsay of neurology.   -Placed on tele bed for observation - may need MRA of neck and head --> f/u neuro recommendations - will hold oral Bp meds to allow permissive HTN in the setting of acute stroke, for SBP>220 or dBP>120 - Crestor - pt was given 324 mg of ASA in ED --> continue ASA -Hold Eliquis for few days per Dr. Leonel Ramsay - fasting lipid panel and HbA1c  - 2D transthoracic echocardiography  - swallowing screen. If fails, will get SLP - PT/OT consult  Essential hypertension: -prn IV hydralazine for SBP> 220 or dBP > 120  Iron deficiency anemia: Hgb 10.8 on 06/16/21 --. 9.5 -Continue iron supplement -Follow-up with CBC  OSA (obstructive sleep apnea) -CPAP  Paroxysmal atrial fibrillation ([place]): Heart rate 70 -Amiodarone, Metoprolol -hold Eliquis now per Dr. Leonel Ramsay  Restless leg syndrome -Pramipexole  CKD (chronic kidney disease) stage 4, GFR 15-29 ml/min ([place]): Stable.  Recent creatinine one-point 06/23/2021.  His creatinine is 1.56, BUN 37 -Follow-up with BMP  HLD (hyperlipidemia) -Crestor  Type II diabetes mellitus with renal manifestations [facility]): Recent A1c 9.4, poorly controlled.  Patient taking Andrew Au at home.  Patient is allergic to glargine insulin -Sliding scale insulin  COPD (chronic obstructive pulmonary disease) ([place]):  Stable -Bronchodilators  Chronic diastolic CHF (congestive heart failure) ([place]): 2D echo on 02/25/2021 showed EF of 60 to 65% with grade 1 diastolic dysfunction.  Patient does not have shortness breath, no leg edema.  CHF seem to be compensated. -Hold spironolactone in the setting of acute stroke  Leukocytosis: Has mild leukocytosis with WBC 11.9, no source of infection identified.  No fever.  Likely reactive -Follow-up with CBC  Bone lesion and history of  Lung cancer [facility]): Patient is s/p of radiation therapy.  MRI of the brain showed multiple bone lesion in skull and c-spin, concerning for metastasized disease. -consulted Dr. Tasia Catchings of oncology -CT scan of chest/abdomen/pelvis per Dr. Tasia Catchings, without contrast.  Carpal tunnel syndrome on right:  s/p of carpal tunnel release procedure by Dr. Rudene Christians on 2/21/ Patient is supposed to see Dr. Rudene Christians as a follow up tmrw. Dr. Rudene Christians will be on-call, may consider to consult Dr. Rudene Christians tomorrow.      DVT ppx: SQ Lovenox --> change to SCD for biopsy purpose (pt received a dose of sq Lovenox at 20:56 on 2/23)  Code Status: Full code  Family Communication:  Yes, patient's  husband  at bed side.       Disposition Plan:  Anticipate discharge back to previous environment  Consults called:  Dr. Tasia Catchings of oncology is consulted.  Dr. Saralyn Pilar of neurology are consulted.   Admission status and Level of care: Telemetry Medical:    for obs    Severity of Illness:  The appropriate patient status for this patient is OBSERVATION. Observation status is judged to be reasonable and necessary in order to provide the required intensity of service to ensure the patient's safety. The patient's presenting symptoms, physical  exam findings, and initial radiographic and laboratory data in the context of their medical condition is felt to place them at decreased risk for further clinical deterioration. Furthermore, it is anticipated that the patient will be medically stable for discharge  from the hospital within 2 midnights of admission.        Date of Service 07/17/2021    Ivor [facility]   If 7PM-7AM, please contact night-coverage [email or web address] 07/17/2021, 2:46 PM

## 2021-07-17 NOTE — ED Notes (Signed)
Patient transported to MRI 

## 2021-07-17 NOTE — Progress Notes (Signed)
? ?  Inpatient Rehab Admissions Coordinator : ? ?Per therapy recommendations, patient was screened for CIR candidacy by Landis Cassaro RN MSN.  At this time patient appears to be a potential candidate for CIR. I will place a rehab consult per protocol for full assessment. Please call me with any questions. ? ?Alita Waldren RN MSN ?Admissions Coordinator ?[phone] ?  ?

## 2021-07-17 NOTE — Evaluation (Signed)
Physical Therapy Evaluation Patient Details Name: Natasha [name] MRN: [id number] DOB: 12-29-1940 Today's Date: 07/17/2021  History of Present Illness  Natasha [name] is a 81 y.o. female with history of atrial fibrillation on Eliquis, CHF, hypertension, hyperlipidemia, diabetes, COPD, chronic kidney disease who presents to the emergency department with complaints of vertigo.  States she went to sleep yesterday around 4 PM and felt fine.  She woke up around midnight to go to the bathroom and felt like the room was spinning.  Symptoms worse with movement and standing. PMH includes: lung Ca, depression.   Clinical Impression  Patient received in bed, reports fatigue. Husband at bedside. She reports pain in B hips with bed mobility, required mod A for supine>< sit. Slow to move, required multiple cues to begin moving to edge of bed. Patient is able to stand with Mod +2 hand held assist. Poor standing balance and reports dizziness with standing. Cues to stand fully upright, patient leaning posteriorly and to her left. Difficulty shifting weight in standing. Able to take 1 step at bedside with mod +2. Patient will continue to benefit from skilled PT while here to improve mobility, balance and safety.          Recommendations for follow up therapy are one component of a multi-disciplinary discharge planning process, led by the attending physician.  Recommendations may be updated based on patient status, additional functional criteria and insurance authorization.  Follow Up Recommendations Acute inpatient rehab (3hours/day)    Assistance Recommended at Discharge Frequent or constant Supervision/Assistance  Patient can return home with the following  Two people to help with walking and/or transfers;A little help with bathing/dressing/bathroom;Help with stairs or ramp for entrance;Assist for transportation;Assistance with cooking/housework    Equipment Recommendations Rolling walker (2 wheels);Other  (comment) (TBD)  Recommendations for Other Services       Functional Status Assessment Patient has had a recent decline in their functional status and demonstrates the ability to make significant improvements in function in a reasonable and predictable amount of time.     Precautions / Restrictions Precautions Precautions: Fall Precaution Comments: recent carpal tunnel sx R UE Restrictions Weight Bearing Restrictions: No      Mobility  Bed Mobility Overal bed mobility: Needs Assistance Bed Mobility: Supine to Sit, Sit to Supine     Supine to sit: Mod assist Sit to supine: Mod assist   General bed mobility comments: patient slow to respond to direction for mobility. Required mod assist for supine >< sit.    Transfers Overall transfer level: Needs assistance Equipment used: 2 person hand held assist Transfers: Sit to/from Stand Sit to Stand: Mod assist, +2 physical assistance           General transfer comment: patient leaning posteriorly on the bed with B LEs. Unable to step. Leaning to her left as well in standing. Patient repors dizziness with standing.    Ambulation/Gait               General Gait Details: unable  Stairs            Wheelchair Mobility    Modified Rankin (Stroke Patients Only)       Balance Overall balance assessment: Needs assistance Sitting-balance support: Feet supported Sitting balance-Leahy Scale: Good     Standing balance support: Bilateral upper extremity supported, During functional activity Standing balance-Leahy Scale: Zero Standing balance comment: requires +2 assist for standing balance  Pertinent Vitals/Pain Pain Assessment Pain Assessment: Faces Faces Pain Scale: Hurts little more Pain Location: B hips Pain Descriptors / Indicators: Discomfort, Guarding, Grimacing Pain Intervention(s): Monitored during session, Repositioned    Home Living Family/patient expects to  be discharged to:: Private residence Living Arrangements: Spouse/significant other Available Help at Discharge: Family;Available 24 hours/day Type of Home: House Home Access: Stairs to enter Entrance Stairs-Rails: None Entrance Stairs-Number of Steps: 3   Home Layout: One level Home Equipment: None      Prior Function Prior Level of Function : Needs assist             Mobility Comments: patient requires assistance from husband for transfers ADLs Comments: independent     Hand Dominance        Extremity/Trunk Assessment   Upper Extremity Assessment Upper Extremity Assessment: RUE deficits/detail RUE Coordination: decreased gross motor    Lower Extremity Assessment Lower Extremity Assessment: RLE deficits/detail;LLE deficits/detail RLE Coordination: decreased gross motor LLE Coordination: decreased gross motor    Cervical / Trunk Assessment Cervical / Trunk Assessment: Normal  Communication   Communication: No difficulties  Cognition Arousal/Alertness: Awake/alert Behavior During Therapy: Flat affect Overall Cognitive Status: Within Functional Limits for tasks assessed                                          General Comments      Exercises     Assessment/Plan    PT Assessment Patient needs continued PT services  PT Problem List Decreased strength;Decreased mobility;Decreased activity tolerance;Decreased balance;Decreased knowledge of use of DME;Decreased coordination       PT Treatment Interventions DME instruction;Therapeutic exercise;Gait training;Balance training;Stair training;Neuromuscular re-education;Functional mobility training;Therapeutic activities;Patient/family education    PT Goals (Current goals can be found in the Care Plan section)  Acute Rehab PT Goals Patient Stated Goal: return home PT Goal Formulation: With patient/family Time For Goal Achievement: 07/22/21 Potential to Achieve Goals: Fair    Frequency  7X/week     Co-evaluation               AM-PAC PT "6 Clicks" Mobility  Outcome Measure Help needed turning from your back to your side while in a flat bed without using bedrails?: A Lot Help needed moving from lying on your back to sitting on the side of a flat bed without using bedrails?: A Lot Help needed moving to and from a bed to a chair (including a wheelchair)?: A Lot Help needed standing up from a chair using your arms (e.g., wheelchair or bedside chair)?: A Lot Help needed to walk in hospital room?: Total Help needed climbing 3-5 steps with a railing? : Total 6 Click Score: 10    End of Session   Activity Tolerance: Patient limited by fatigue Patient left: in bed;with call bell/phone within reach;with family/visitor present;with nursing/sitter in room Nurse Communication: Mobility status PT Visit Diagnosis: Unsteadiness on feet (R26.81);Other abnormalities of gait and mobility (R26.89);Difficulty in walking, not elsewhere classified (R26.2)    Time: 9937-1696 PT Time Calculation (min) (ACUTE ONLY): 13 min   Charges:   PT Evaluation $PT Eval Moderate Complexity: 1 Mod          Andrian Sabala, PT, GCS 07/17/21,12:26 PM

## 2021-07-18 DIAGNOSIS — C3412 Malignant neoplasm of upper lobe, left bronchus or lung: Secondary | ICD-10-CM | POA: Diagnosis present

## 2021-07-18 DIAGNOSIS — M899 Disorder of bone, unspecified: Secondary | ICD-10-CM | POA: Diagnosis not present

## 2021-07-18 DIAGNOSIS — C7951 Secondary malignant neoplasm of bone: Secondary | ICD-10-CM | POA: Diagnosis present

## 2021-07-18 DIAGNOSIS — I639 Cerebral infarction, unspecified: Secondary | ICD-10-CM | POA: Diagnosis not present

## 2021-07-18 DIAGNOSIS — C7931 Secondary malignant neoplasm of brain: Secondary | ICD-10-CM | POA: Diagnosis present

## 2021-07-18 DIAGNOSIS — N184 Chronic kidney disease, stage 4 (severe): Secondary | ICD-10-CM | POA: Diagnosis present

## 2021-07-18 DIAGNOSIS — I480 Paroxysmal atrial fibrillation: Secondary | ICD-10-CM | POA: Diagnosis not present

## 2021-07-18 LAB — RETIC PANEL
Immature Retic Fract: 28.5 % — ABNORMAL HIGH (ref 2.3–15.9)
RBC.: 3.21 MIL/uL — ABNORMAL LOW (ref 3.87–5.11)
Retic Count, Absolute: 79.3 10*3/uL (ref 19.0–186.0)
Retic Ct Pct: 2.5 % (ref 0.4–3.1)
Reticulocyte Hemoglobin: 31.8 pg (ref >27.9)

## 2021-07-18 LAB — URINALYSIS, ROUTINE W REFLEX MICROSCOPIC
Bacteria, UA: NONE SEEN
Bilirubin Urine: NEGATIVE
Glucose, UA: >=500 mg/dL — AB
Ketones, ur: 5 mg/dL — AB
Nitrite: NEGATIVE
Protein, ur: 30 mg/dL — AB
Specific Gravity, Urine: 1.021 (ref 1.005–1.030)
pH: 5 (ref 5.0–8.0)

## 2021-07-18 LAB — GLUCOSE, CAPILLARY
Glucose-Capillary: 171 mg/dL — ABNORMAL HIGH (ref 70–99)
Glucose-Capillary: 137 mg/dL — ABNORMAL HIGH (ref 70–99)
Glucose-Capillary: 110 mg/dL — ABNORMAL HIGH (ref 70–99)
Glucose-Capillary: 193 mg/dL — ABNORMAL HIGH (ref 70–99)

## 2021-07-18 LAB — IRON AND TIBC
Iron: 23 ug/dL — ABNORMAL LOW (ref 28–170)
Saturation Ratios: 8 % — ABNORMAL LOW (ref 10.4–31.8)
TIBC: 297 ug/dL (ref 250–450)
UIBC: 274 ug/dL

## 2021-07-18 LAB — VITAMIN B12: Vitamin B-12: 834 pg/mL (ref 180–914)

## 2021-07-18 LAB — HEMOGLOBIN A1C
Hgb A1c MFr Bld: 8.3 % — ABNORMAL HIGH (ref 4.8–5.6)
Mean Plasma Glucose: 192 mg/dL

## 2021-07-18 LAB — FOLATE: Folate: 10 ng/mL (ref >5.9)

## 2021-07-18 LAB — FERRITIN: Ferritin: 242 ng/mL (ref 11–307)

## 2021-07-18 LAB — TSH: TSH: 4.974 u[IU]/mL — ABNORMAL HIGH (ref 0.350–4.500)

## 2021-07-18 NOTE — Progress Notes (Signed)
PROGRESS NOTE    Natasha [name]  CVE:[id number] DOB: 25-Oct-1940 DOA: 07/17/2021 PCP: Lesleigh Noe, MD  Assessment & Plan:   Principal Problem:   Acute embolic stroke [facility]) Active Problems:   Essential hypertension   Iron deficiency anemia   OSA (obstructive sleep apnea)   Paroxysmal atrial fibrillation (HCC)   Restless leg syndrome   CKD (chronic kidney disease) stage 4, GFR 15-29 ml/min (HCC)   HLD (hyperlipidemia)   Type II diabetes mellitus with renal manifestations (HCC)   COPD (chronic obstructive pulmonary disease) (HCC)   Chronic diastolic CHF (congestive heart failure) (HCC)   Leukocytosis   Bone lesion   Lung cancer ([place])   Carpal tunnel syndrome on right   Lesion of liver   Acute embolic stroke:  MRI-brain showed numerous small acute infarcts scattered in the brain and compatible with central embolic disease. MRA head and neck shows no intracranial large vessel occlusion or significant stenosis & no significant stenosis in the neck. Was on Eliquis for A-fib which is on hold for recent carpal tunnel release procedure. Allow for permissive HTN. Continue on stati and hold aspirin. Echo ordered. PT/OT consulted. Continue to hold eliquis as per neuro. Neuro following and recs apprec    HTN: hold home anti-HTN meds to allow for permissive HTN    IDA: continue on iron supplement   OSA: continue on CPP    PAF: continue on amiodarone, metoprolol. Hold eliquis as per neuro    Restless leg syndrome: continue on home dose of pramipexole   CKDIV: Cr is labile. Avoid nephrotoxic meds    HLD: continue on statin    DM2: poorly controlled, HbA1c 9.4. Continue on SSI w/ accuchecks    COPD: w/o exacerbation. Continue on bronchodilators    Chronic diastolic [facility]:ENID on 78/06/4233 showed EF of 60 to 65% with grade 1 diastolic dysfunction.CHF seem to be compensated. Hold aldactone    Leukocytosis: likely reactive    Bone lesion and history of lung cancer : s/p of radiation  therapy.  MRI of the brain showed multiple bone lesion in skull and c-spine, concerning for metastasized disease. Biopsy ordered and will be done on 07/21/21 as aspirin needs to be held for a couple of days as per IR    Carpal tunnel syndrome on right:s/p of carpal tunnel release procedure by Dr. Rudene Christians on 07/15/21.  Thrombocytopenia: etiology unclear. Will continue to monitor   DVT prophylaxis: SCDs Code Status: full Family Communication: discussed pt's care w/ pt's husband at bedside and answered his questions  Disposition Plan: likely d/c back home as pt and pt's husband are refusing CIR  Level of care: Telemetry Medical  Status is: Inpatient Remains inpatient appropriate because: embolic CVA and will have biopsy on 07/21/21      Consultants:  [facility] Neuro   Procedures:   Antimicrobials:    Subjective: Pt c/o b/l hip pain   Objective: Vitals:   07/17/21 1400 07/17/21 1606 07/17/21 2000 07/18/21 0446  BP: 122/60 124/64 (!) 123/52 (!) 141/58  Pulse: 68 67 68 72  Resp: $Remo'15  15 15  'aNdhR$ Temp:  98.5 F (36.9 C) 97.9 F (36.6 C) (!) 97.5 F (36.4 C)  TempSrc:      SpO2: 90% 95% 93% 94%  Weight:      Height:       No intake or output data in the 24 hours ending 07/18/21 0530 Filed Weights   07/17/21 0346  Weight: 62.1 kg    Examination:  General exam:  Appears calm but uncomfortable  Respiratory system: Clear to auscultation. Respiratory effort normal. Cardiovascular system: S1 & S2 +. No  rubs, gallops or clicks. Gastrointestinal system: Abdomen is nondistended, soft and nontender. Hypoactive bowel sounds heard. Central nervous system: Alert and awake. Follows simple commands  Psychiatry: Judgement and insight appear at baseline.Flat mood and affect      Data Reviewed: I have personally reviewed following labs and imaging studies  CBC: Recent Labs  Lab 07/17/21 0352  WBC 11.9*  HGB 9.5*  HCT 29.1*  MCV 86.9  PLT 294*   Basic Metabolic Panel: Recent Labs   Lab 07/17/21 0352  NA 135  K 4.7  CL 103  CO2 21*  GLUCOSE 241*  BUN 37*  CREATININE 1.56*  CALCIUM 8.5*   GFR: Estimated Creatinine Clearance: 24.4 mL/min (A) (by C-G formula based on SCr of 1.56 mg/dL (H)). Liver Function Tests: No results for input(s): AST, ALT, ALKPHOS, BILITOT, PROT, ALBUMIN in the last 168 hours. No results for input(s): LIPASE, AMYLASE in the last 168 hours. No results for input(s): AMMONIA in the last 168 hours. Coagulation Profile: Recent Labs  Lab 07/17/21 0352  INR 1.3*   Cardiac Enzymes: No results for input(s): CKTOTAL, CKMB, CKMBINDEX, TROPONINI in the last 168 hours. BNP (last 3 results) No results for input(s): PROBNP in the last 8760 hours. HbA1C: Recent Labs    07/17/21 0352  HGBA1C 8.3*   CBG: Recent Labs  Lab 07/15/21 1219 07/17/21 0802 07/17/21 1144 07/17/21 1703 07/17/21 2037  GLUCAP 130* 262* 221* 157* 166*   Lipid Profile: Recent Labs    07/17/21 0352  CHOL 100  HDL 30*  LDLCALC 30  TRIG 198*  CHOLHDL 3.3   Thyroid Function Tests: No results for input(s): TSH, T4TOTAL, FREET4, T3FREE, THYROIDAB in the last 72 hours. Anemia Panel: Recent Labs    07/18/21 0431  RETICCTPCT 2.5   Sepsis Labs: No results for input(s): PROCALCITON, LATICACIDVEN in the last 168 hours.  Recent Results (from the past 240 hour(s))  Resp Panel by RT-PCR (Flu A&B, Covid) Nasopharyngeal Swab     Status: None   Collection Time: 07/17/21  6:57 AM   Specimen: Nasopharyngeal Swab; Nasopharyngeal(NP) swabs in vial transport medium  Result Value Ref Range Status   SARS Coronavirus 2 by RT PCR NEGATIVE NEGATIVE Final    Comment: (NOTE) SARS-CoV-2 target nucleic acids are NOT DETECTED.  The SARS-CoV-2 RNA is generally detectable in upper respiratory specimens during the acute phase of infection. The lowest concentration of SARS-CoV-2 viral copies this assay can detect is 138 copies/mL. A negative result does not preclude  SARS-Cov-2 infection and should not be used as the sole basis for treatment or other patient management decisions. A negative result may occur with  improper specimen collection/handling, submission of specimen other than nasopharyngeal swab, presence of viral mutation(s) within the areas targeted by this assay, and inadequate number of viral copies(<138 copies/mL). A negative result must be combined with clinical observations, patient history, and epidemiological information. The expected result is Negative.  Fact Sheet for Patients:  EntrepreneurPulse.com.au  Fact Sheet for Healthcare Providers:  IncredibleEmployment.be  This test is no t yet approved or cleared by the [place] FDA and  has been authorized for detection and/or diagnosis of SARS-CoV-2 by FDA under an Emergency Use Authorization (EUA). This EUA will remain  in effect (meaning this test can be used) for the duration of the COVID-19 declaration under Section 564(b)(1) of the Act, 21 U.S.C.section 360bbb-3(b)(1), unless  the authorization is terminated  or revoked sooner.       Influenza A by PCR NEGATIVE NEGATIVE Final   Influenza B by PCR NEGATIVE NEGATIVE Final    Comment: (NOTE) The Xpert Xpress SARS-CoV-2/FLU/RSV plus assay is intended as an aid in the diagnosis of influenza from Nasopharyngeal swab specimens and should not be used as a sole basis for treatment. Nasal washings and aspirates are unacceptable for Xpert Xpress SARS-CoV-2/FLU/RSV testing.  Fact Sheet for Patients: EntrepreneurPulse.com.au  Fact Sheet for Healthcare Providers: IncredibleEmployment.be  This test is not yet approved or cleared by the [place] FDA and has been authorized for detection and/or diagnosis of SARS-CoV-2 by FDA under an Emergency Use Authorization (EUA). This EUA will remain in effect (meaning this test can be used) for the duration of  the COVID-19 declaration under Section 564(b)(1) of the Act, 21 U.S.C. section 360bbb-3(b)(1), unless the authorization is terminated or revoked.  Performed at [facility], [address] [place], [place] [address]          Radiology Studies: CT Head Wo Contrast  Result Date: 07/17/2021 CLINICAL DATA:  Nonspecific dizziness EXAM: CT HEAD WITHOUT CONTRAST TECHNIQUE: Contiguous axial images were obtained from the base of the skull through the vertex without intravenous contrast. RADIATION DOSE REDUCTION: This exam was performed according to the departmental dose-optimization program which includes automated exposure control, adjustment of the mA and/or kV according to patient size and/or use of iterative reconstruction technique. COMPARISON:  03/09/2021 FINDINGS: Brain: 2 small right cerebellar infarcts not seen on prior, possibly recent and symptomatic. Few remote supratentorial white matter insults. No hemorrhage, hydrocephalus, or collection. Age normal brain volume. Vascular: No hyperdense vessel or unexpected calcification. Skull: Normal. Negative for fracture or focal lesion. Sinuses/Orbits: No acute finding. 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 Electronically Signed   By: Jorje Guild M.D.   On: 07/17/2021 04:32   MR ANGIO HEAD WO CONTRAST  Result Date: 07/17/2021 CLINICAL DATA:  Dizziness, infarcts on MRI EXAM: MRA NECK WITHOUT CONTRAST MRA HEAD WITHOUT CONTRAST TECHNIQUE: Angiographic images of the Circle of Willis were acquired using MRA technique without intravenous contrast. COMPARISON:  None. FINDINGS: MRA NECK FINDINGS Standard aortic branching. Common, internal, and external carotid arteries are patent, without hemodynamically significant stenosis. Extracranial vertebral arteries are patent, without hemodynamically significant stenosis, although imaging of the origins is somewhat limited by  artifact. MRA HEAD FINDINGS Both internal carotid arteries are patent to the termini, without significant stenosis. A1 segments patent. Normal anterior communicating artery. Anterior cerebral arteries are patent to their distal aspects. No M1 stenosis or occlusion. Normal MCA bifurcations. Distal MCA branches perfused and symmetric. Vertebral arteries patent to the vertebrobasilar junction without stenosis. Basilar patent to its distal aspect. Superior cerebellar arteries patent bilaterally. Patent P1 segments, diminutive on the right. Near fetal origin of the right PCA with patent right posterior communicating artery. PCAs perfused to their distal aspects without stenosis. Possible diminutive left posterior communicating artery. IMPRESSION: 1.  No intracranial large vessel occlusion or significant stenosis. 2.  No hemodynamically significant stenosis in the neck. Electronically Signed   By: Merilyn Baba M.D.   On: 07/17/2021 23:08   MR ANGIO NECK WO CONTRAST  Result Date: 07/17/2021 CLINICAL DATA:  Dizziness, infarcts on MRI EXAM: MRA NECK WITHOUT CONTRAST MRA HEAD WITHOUT CONTRAST TECHNIQUE: Angiographic images of the Circle of Willis were acquired using MRA technique without intravenous contrast. COMPARISON:  None.  FINDINGS: MRA NECK FINDINGS Standard aortic branching. Common, internal, and external carotid arteries are patent, without hemodynamically significant stenosis. Extracranial vertebral arteries are patent, without hemodynamically significant stenosis, although imaging of the origins is somewhat limited by artifact. MRA HEAD FINDINGS Both internal carotid arteries are patent to the termini, without significant stenosis. A1 segments patent. Normal anterior communicating artery. Anterior cerebral arteries are patent to their distal aspects. No M1 stenosis or occlusion. Normal MCA bifurcations. Distal MCA branches perfused and symmetric. Vertebral arteries patent to the vertebrobasilar junction without  stenosis. Basilar patent to its distal aspect. Superior cerebellar arteries patent bilaterally. Patent P1 segments, diminutive on the right. Near fetal origin of the right PCA with patent right posterior communicating artery. PCAs perfused to their distal aspects without stenosis. Possible diminutive left posterior communicating artery. IMPRESSION: 1.  No intracranial large vessel occlusion or significant stenosis. 2.  No hemodynamically significant stenosis in the neck. Electronically Signed   By: Merilyn Baba M.D.   On: 07/17/2021 23:08   MR BRAIN WO CONTRAST  Result Date: 07/17/2021 CLINICAL DATA:  Nonspecific dizziness. EXAM: MRI HEAD WITHOUT CONTRAST TECHNIQUE: Multiplanar, multiecho pulse sequences of the brain and surrounding structures were obtained without intravenous contrast. COMPARISON:  Head CT from earlier today FINDINGS: Brain: Patchy acute infarcts in the bilateral cerebellum and bilateral frontal, parietal, and occipital convexities. Patchy acute infarct in the right more than left centrum semiovale and in the left caudate head. Mild petechial hemorrhage at the right occipital cortex. No hematoma, hydrocephalus, or collection. Vascular: Normal flow voids Skull and upper cervical spine: New scattered bone lesions in the C2 right articular process, right para median clivus tip, and in the bilateral calvarium, affected areas marked on sagittal T2 weighted imaging. Sinuses/Orbits: Negative IMPRESSION: 1. Numerous small acute infarcts scattered in the brain and compatible with central embolic disease. 2. 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 Recommend metastatic workup. Electronically Signed   By: Jorje Guild M.D.   On: 07/17/2021 05:42   ECHOCARDIOGRAM COMPLETE  Result Date: 07/17/2021    ECHOCARDIOGRAM REPORT   Patient Name:   [facility] Date of Exam: 07/17/2021 Medical Rec #:  [id number]     Height:       64.0 in Accession #:    [id number]    Weight:        137.0 lb Date of Birth:  07-03-1940     BSA:          1.666 m Patient Age:    22 years      BP:           131/66 mmHg Patient Gender: F             HR:           67 bpm. Exam Location:  [facility] Procedure: 2D Echo, Cardiac Doppler and Color Doppler Indications:     Stroke I63.9  History:         Patient has prior history of Echocardiogram examinations, most                  recent 02/25/2021. COPD, Arrythmias:Atrial Fibrillation;                  Signs/Symptoms:Murmur.  Sonographer:     Sherrie Sport Referring Phys:  [id number] Ivor Costa Diagnosing Phys: Ida Rogue MD  Sonographer Comments: No parasternal window and suboptimal apical window. Image acquisition challenging due to COPD. IMPRESSIONS  1. Left ventricular ejection fraction, by  estimation, is 60 to 65%. The left ventricle has normal function. The left ventricle has no regional wall motion abnormalities. Left ventricular diastolic parameters are consistent with Grade I diastolic dysfunction (impaired relaxation).  2. Right ventricular systolic function is normal. The right ventricular size is normal. There is mildly elevated pulmonary artery systolic pressure. The estimated right ventricular systolic pressure is 85.8 mmHg.  3. The mitral valve is normal in structure. No evidence of mitral valve regurgitation. No evidence of mitral stenosis.  4. The aortic valve is normal in structure. Aortic valve regurgitation is mild to moderate. No aortic stenosis is present.  5. The inferior vena cava is normal in size with greater than 50% respiratory variability, suggesting right atrial pressure of 3 mmHg. FINDINGS  Left Ventricle: Left ventricular ejection fraction, by estimation, is 60 to 65%. The left ventricle has normal function. The left ventricle has no regional wall motion abnormalities. The left ventricular internal cavity size was normal in size. There is  no left ventricular hypertrophy. Left ventricular diastolic parameters are consistent with Grade I diastolic  dysfunction (impaired relaxation). Right Ventricle: The right ventricular size is normal. No increase in right ventricular wall thickness. Right ventricular systolic function is normal. There is mildly elevated pulmonary artery systolic pressure. The tricuspid regurgitant velocity is 3.00  m/s, and with an assumed right atrial pressure of 5 mmHg, the estimated right ventricular systolic pressure is 85.0 mmHg. Left Atrium: Left atrial size was normal in size. Right Atrium: Right atrial size was normal in size. Pericardium: There is no evidence of pericardial effusion. Mitral Valve: The mitral valve is normal in structure. Mild mitral annular calcification. No evidence of mitral valve regurgitation. No evidence of mitral valve stenosis. MV peak gradient, 5.9 mmHg. The mean mitral valve gradient is 2.0 mmHg. Tricuspid Valve: The tricuspid valve is normal in structure. Tricuspid valve regurgitation is not demonstrated. No evidence of tricuspid stenosis. Aortic Valve: The aortic valve is normal in structure. Aortic valve regurgitation is mild to moderate. No aortic stenosis is present. Aortic valve mean gradient measures 2.5 mmHg. Aortic valve peak gradient measures 4.3 mmHg. Aortic valve area, by VTI measures 3.16 cm. Pulmonic Valve: The pulmonic valve was normal in structure. Pulmonic valve regurgitation is not visualized. No evidence of pulmonic stenosis. Aorta: The aortic root is normal in size and structure. Venous: The inferior vena cava is normal in size with greater than 50% respiratory variability, suggesting right atrial pressure of 3 mmHg. IAS/Shunts: No atrial level shunt detected by color flow Doppler.  LEFT VENTRICLE PLAX 2D LVIDd:         3.54 cm   Diastology LVIDs:         2.34 cm   LV e' medial:    5.22 cm/s LV PW:         0.98 cm   LV E/e' medial:  12.3 LV IVS:        0.89 cm   LV e' lateral:   7.40 cm/s LVOT diam:     2.00 cm   LV E/e' lateral: 8.7 LV SV:         66 LV SV Index:   40 LVOT Area:      3.14 cm  RIGHT VENTRICLE RV Basal diam:  3.40 cm RV S prime:     14.80 cm/s TAPSE (M-mode): 2.7 cm LEFT ATRIUM             Index        RIGHT ATRIUM  Index LA diam:        3.60 cm 2.16 cm/m   RA Area:     18.20 cm LA Vol (A2C):   91.5 ml 54.93 ml/m  RA Volume:   52.90 ml  31.76 ml/m LA Vol (A4C):   62.7 ml 37.64 ml/m LA Biplane Vol: 77.7 ml 46.64 ml/m  AORTIC VALVE AV Area (Vmax):    2.47 cm AV Area (Vmean):   2.53 cm AV Area (VTI):     3.16 cm AV Vmax:           104.05 cm/s AV Vmean:          68.000 cm/s AV VTI:            0.211 m AV Peak Grad:      4.3 mmHg AV Mean Grad:      2.5 mmHg LVOT Vmax:         81.90 cm/s LVOT Vmean:        54.700 cm/s LVOT VTI:          0.212 m LVOT/AV VTI ratio: 1.00  AORTA Ao Root diam: 2.50 cm MITRAL VALVE                TRICUSPID VALVE MV Area (PHT): 2.76 cm     TR Peak grad:   36.0 mmHg MV Area VTI:   2.13 cm     TR Vmax:        300.00 cm/s MV Peak grad:  5.9 mmHg MV Mean grad:  2.0 mmHg     SHUNTS MV Vmax:       1.21 m/s     Systemic VTI:  0.21 m MV Vmean:      72.7 cm/s    Systemic Diam: 2.00 cm MV Decel Time: 275 msec MV E velocity: 64.30 cm/s MV A velocity: 117.00 cm/s MV E/A ratio:  0.55 Ida Rogue MD Electronically signed by Ida Rogue MD Signature Date/Time: 07/17/2021/4:35:51 PM    Final    CT CHEST ABDOMEN PELVIS WO CONTRAST  Result Date: 07/17/2021 CLINICAL DATA:  Bone lesions seen on brain MRI. Evaluate for underlying malignancy. History of lung cancer. EXAM: CT CHEST, ABDOMEN AND PELVIS WITHOUT CONTRAST TECHNIQUE: Multidetector CT imaging of the chest, abdomen and pelvis was performed following the standard protocol without IV contrast. RADIATION DOSE REDUCTION: This exam was performed according to the departmental dose-optimization program which includes automated exposure control, adjustment of the mA and/or kV according to patient size and/or use of iterative reconstruction technique. COMPARISON:  Chest CT 04/10/2021 FINDINGS: CT CHEST  FINDINGS Cardiovascular: The heart is normal in size. No pericardial effusion. The aorta is normal in caliber. Stable atherosclerotic calcifications. Remarkably no coronary artery calcifications. Mediastinum/Nodes: Progressive left hilar adenopathy the, difficult to measure without contrast. New subcarinal adenopathy with 12.5 mm node on image 28/2. 8.5 mm right paratracheal node on image 20/2. Lungs/Pleura: Enlarging left upper lobe/suprahilar mass measuring approximately 3 cm on image 33/4. Findings consistent with recurrent lung cancer and left hilar and mediastinal adenopathy. No new pulmonary nodules to suggest pulmonary metastatic disease. Progressive right basilar scarring changes and streaky basilar atelectasis. Musculoskeletal: No breast masses are identified. No supraclavicular adenopathy. A few scattered axillary lymph nodes are stable. Suspect scattered subtle slightly sclerotic bone lesions. CT ABDOMEN PELVIS FINDINGS Hepatobiliary: New diffuse hepatic metastatic disease. Numerous small lesions throughout both lobes of the liver. The largest lesion at the right hepatic dome measures 2.5 cm on image 43/2. The gallbladder is surgically absent.  No common bile duct dilatation. Pancreas: No mass, inflammation or ductal dilatation. Spleen: Normal size.  No focal lesions. Adrenals/Urinary Tract: Stable right adrenal gland nodule. No worrisome renal lesions are identified without contrast. 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 No complicating features. The small bowel and colon are grossly normal. Vascular/Lymphatic: Stable atherosclerotic calcifications involving the aorta and iliac arteries but no aneurysm. Small scattered mesenteric and retroperitoneal lymph nodes but no mass or overt adenopathy the. Reproductive: Surgically absent. Other: No pelvic mass or adenopathy. No free pelvic fluid collections. No inguinal mass or adenopathy. No abdominal wall hernia or subcutaneous  lesions. Musculoskeletal: No lytic destructive bone lesions. No spinal canal compromise. IMPRESSION: 1. Enlarging left upper lobe/suprahilar mass with associated left hilar and mediastinal adenopathy consistent with recurrent lung cancer. 2. New diffuse hepatic metastatic disease. 3. Suspect scattered subtle slightly sclerotic bone lesions. No lytic or destructive bone lesions. 4. PET-CT may be helpful for accurate staging, if necessary. 5. Stable right adrenal gland nodule. 6. 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 * onc * Aortic Atherosclerosis (ICD10-I70.0). Electronically Signed   By: Marijo Sanes M.D.   On: 07/17/2021 10:58        Scheduled Meds:   stroke: mapping our early stages of recovery book   Does not apply Once   amiodarone  400 mg Oral Daily   aspirin EC  325 mg Oral Daily   calcium-vitamin D  1 tablet Oral Daily   chlorhexidine  5 mL Mouth/Throat BID   enoxaparin (LOVENOX) injection  30 mg Subcutaneous Q24H   ferrous gluconate  324 mg Oral q AM   gabapentin  300 mg Oral QHS   insulin aspart  0-5 Units Subcutaneous QHS   insulin aspart  0-9 Units Subcutaneous TID WC   metoprolol succinate  25 mg Oral Daily   pantoprazole  40 mg Oral Daily   pramipexole  1 mg Oral QHS   rosuvastatin  20 mg Oral QHS   sertraline  50 mg Oral QHS   Continuous Infusions:   LOS: 0 days    Time spent: 30 mins    Wyvonnia Dusky, MD [facility] Pager [phone]  If 7PM-7AM, please contact night-coverage 07/18/2021, 5:30 AM

## 2021-07-18 NOTE — Progress Notes (Signed)
Subjective: She reports her symptoms are improving  Exam: Vitals:   07/18/21 1205 07/18/21 1741  BP: (!) 126/57 (!) 142/57  Pulse: 71 75  Resp: 16 16  Temp: 97.8 F (36.6 C) 98.1 F (36.7 C)  SpO2: 94% 94%   Gen: In bed, NAD Resp: non-labored breathing, no acute distress Abd: soft, nt  Neuro: MS: awake, alert CN:? Mild left facial droop, VFF to finger movement Motor: drift on left, 4+/5 in left arm and leg Ataxic in L arm and leg  Pertinent Labs: LDL 30  Impression: 81 year old female with a history of atrial flutter who is on anticoagulation, but this has been held due to surgery with strokes consistent with emboli in the setting of holding Eliquis.  I strongly suspect cardiac source due to holding Eliquis.  She needs to have a liver biopsy. Currently, I would favor holding anticoagulation for 5  days due to her stroke. I typically bridge with ASA, but given that she needs the procedure, I think it is reasonable to hold this for now. It likely would be higher risk to stop anticoagulation in the future than holding ASA now.   Recommendations: 1) Restart eliquis on Tuesday 2) PT/OT/ST 3) Neurology will be available as needed.    Roland Rack, MD [facility] [phone]  If 7pm- 7am, please page neurology on call as listed in [place].

## 2021-07-18 NOTE — Progress Notes (Signed)
Physical Therapy Treatment Patient Details Name: Natasha [name] MRN: [id number] DOB: 1940/11/12 Today's Date: 07/18/2021   History of Present Illness Natasha [name] is a 81 y.o. female with history of atrial fibrillation on Eliquis, CHF, hypertension, hyperlipidemia, diabetes, COPD, chronic kidney disease who presents to the emergency department with complaints of vertigo.  States she went to sleep yesterday around 4 PM and felt fine.  She woke up around midnight to go to the bathroom and felt like the room was spinning.  Symptoms worse with movement and standing. PMH includes: lung Ca, depression    PT Comments    Pt was asleep in recliner with supportive spouse at bedside. She easily awakes and is agreeable to session however limited session due to pain/fatigue/ and pt willingness. Pt presents with flat affect but was able to answer questions appropriately while following commands with increased time to process. Pt needs constant encouragement for full participation. She was able to stand 2 x from recliner prior to stand pivot/step pivot back to bed. Max assist to safely progress back to bed form recliner. Chief Strategy Officer recommends RN staff have +2 assistance for all transfers and OOB activity. Pt will greatly benefit form continued skilled PT to maximize independence while assisting pt to maximal independence with ADLs.     Recommendations for follow up therapy are one component of a multi-disciplinary discharge planning process, led by the attending physician.  Recommendations may be updated based on patient status, additional functional criteria and insurance authorization.  Follow Up Recommendations  Acute inpatient rehab (3hours/day)     Assistance Recommended at Discharge Frequent or constant Supervision/Assistance  Patient can return home with the following A little help with bathing/dressing/bathroom;Help with stairs or ramp for entrance;Assist for transportation;Assistance with  cooking/housework;A lot of help with walking and/or transfers;Two people to help with walking and/or transfers;Direct supervision/assist for medications management;Direct supervision/assist for financial management   Equipment Recommendations  Other (comment) (ongoing assessment)       Precautions / Restrictions Precautions Precautions: Fall Precaution Comments: recent carpal tunnel sx R UE Restrictions Weight Bearing Restrictions: No     Mobility  Bed Mobility Overal bed mobility: Needs Assistance Bed Mobility: Sit to Supine       Sit to supine: Max assist   General bed mobility comments: Max assist to progress BLEs into bed and reposition to [facility]    Transfers Overall transfer level: Needs assistance Equipment used:  (author supported pt under both axillas) Transfers: Sit to/from Stand, Bed to chair/wheelchair/BSC Sit to Stand: Max assist Stand pivot transfers: Max assist         General transfer comment: Pt stood 2 x from recliner prior to stand pivot rec>bed. Author feels pt is able to perform more than she leads on. When fully participating, is able to stand with mod assist of one however most of transfers and to pivot, require max assist    Ambulation/Gait Ambulation/Gait assistance: Max assist Gait Distance (Feet): 2 Feet Assistive device: None (author supporting pt throughout all standing activity)   Gait velocity: decreased     General Gait Details: pt was able to advance BLEs when assisted, lateral wt shift required to allow opposite LE advancement.       Balance Overall balance assessment: Needs assistance Sitting-balance support: Feet supported Sitting balance-Leahy Scale: Fair     Standing balance support: Bilateral upper extremity supported, During functional activity Standing balance-Leahy Scale: Poor Standing balance comment: high fall risk      Cognition Arousal/Alertness: Awake/alert Behavior During Therapy:  Flat affect Overall Cognitive  Status: Impaired/Different from baseline        General Comments: Pt is awake throughout session however presents with flat affect. she did not want to participate however was willing with encouragement. Supportive spouse present throghout session.               Pertinent Vitals/Pain Pain Assessment Pain Assessment: 0-10 Pain Score: 4  Pain Location: B hips Pain Descriptors / Indicators: Discomfort, Guarding, Grimacing Pain Intervention(s): Limited activity within patient's tolerance, Monitored during session, Premedicated before session, Repositioned     PT Goals (current goals can now be found in the care plan section) Acute Rehab PT Goals Patient Stated Goal: return home Progress towards PT goals: Progressing toward goals    Frequency    7X/week      PT Plan Current plan remains appropriate    Co-evaluation     PT goals addressed during session: Mobility/safety with mobility;Balance;Strengthening/ROM        AM-PAC PT "6 Clicks" Mobility   Outcome Measure  Help needed turning from your back to your side while in a flat bed without using bedrails?: A Lot Help needed moving from lying on your back to sitting on the side of a flat bed without using bedrails?: A Lot Help needed moving to and from a bed to a chair (including a wheelchair)?: A Lot Help needed standing up from a chair using your arms (e.g., wheelchair or bedside chair)?: A Lot Help needed to walk in hospital room?: Total Help needed climbing 3-5 steps with a railing? : Total 6 Click Score: 10    End of Session   Activity Tolerance: Patient limited by pain;Patient limited by fatigue;Patient limited by lethargy Patient left: in bed;with call bell/phone within reach;with family/visitor present;with nursing/sitter in room Nurse Communication: Mobility status PT Visit Diagnosis: Unsteadiness on feet (R26.81);Other abnormalities of gait and mobility (R26.89);Difficulty in walking, not elsewhere  classified (R26.2)     Time: 1275-1700 PT Time Calculation (min) (ACUTE ONLY): 16 min  Charges:  $Therapeutic Activity: 8-22 mins                    Julaine Fusi PTA 07/18/21, 2:33 PM

## 2021-07-18 NOTE — Progress Notes (Signed)
Occupational Therapy Treatment Patient Details Name: Natasha [name] MRN: [id number] DOB: Oct 18, 1940 Today's Date: 07/18/2021   History of present illness Natasha [name] is a 81 y.o. female with history of atrial fibrillation on Eliquis, CHF, hypertension, hyperlipidemia, diabetes, COPD, chronic kidney disease who presents to the emergency department with complaints of vertigo.  States she went to sleep yesterday around 4 PM and felt fine.  She woke up around midnight to go to the bathroom and felt like the room was spinning.  Symptoms worse with movement and standing. PMH includes: lung Ca, depression   OT comments  Ms Bey was seen for OT treatment on this date. Upon arrival to room pt reclined in bed, family at bedside, pt agreeable to tx. RN requesting assistance with toileting. Pt reqruies MOD A x2 + HHA for BSC t/f. CGA sitting balance, attempts perihygiene with lateral leans, pt reports increased B hip pain. MAX A x2 perihygiene in standing. MAX A x2 don/doff underwear, +2 assist in standing. Pt left in chair with call bell on. Pt making good progress toward goals. Pt continues to benefit from skilled OT services to maximize return to PLOF and minimize risk of future falls, injury, caregiver burden, and readmission. Will continue to follow POC. Discharge recommendation remains appropriate.     Recommendations for follow up therapy are one component of a multi-disciplinary discharge planning process, led by the attending physician.  Recommendations may be updated based on patient status, additional functional criteria and insurance authorization.    Follow Up Recommendations  Acute inpatient rehab (3hours/day)    Assistance Recommended at Discharge Frequent or constant Supervision/Assistance  Patient can return home with the following  Two people to help with walking and/or transfers;Two people to help with bathing/dressing/bathroom;Help with stairs or ramp for entrance   Equipment  Recommendations  Other (comment) (defer)    Recommendations for Other Services      Precautions / Restrictions Precautions Precautions: Fall Precaution Comments: recent carpal tunnel sx R UE Restrictions Weight Bearing Restrictions: No       Mobility Bed Mobility Overal bed mobility: Needs Assistance Bed Mobility: Supine to Sit     Supine to sit: Max assist          Transfers Overall transfer level: Needs assistance Equipment used: 2 person hand held assist Transfers: Sit to/from Stand, Bed to chair/wheelchair/BSC Sit to Stand: Mod assist, +2 physical assistance     Step pivot transfers: Mod assist, +2 physical assistance     General transfer comment: L lean in standing     Balance Overall balance assessment: Needs assistance Sitting-balance support: Feet supported Sitting balance-Leahy Scale: Fair   Postural control: Left lateral lean Standing balance support: Bilateral upper extremity supported, During functional activity Standing balance-Leahy Scale: Poor                             ADL either performed or assessed with clinical judgement   ADL Overall ADL's : Needs assistance/impaired                                       General ADL Comments: MOD A x2 + HHA for BSC t/f. MAX A x2 perihygiene in standing. MAX A x2 don/doff underwear, +2 assist in standing.      Cognition Arousal/Alertness: Awake/alert Behavior During Therapy: Flat affect Overall Cognitive Status: Within Functional Limits for  tasks assessed                                                     Pertinent Vitals/ Pain       Pain Assessment Pain Assessment: Faces Faces Pain Scale: Hurts even more Pain Location: B hips Pain Descriptors / Indicators: Discomfort, Guarding, Grimacing Pain Intervention(s): Repositioned, Limited activity within patient's tolerance   Frequency  Min 5X/week        Progress Toward Goals  OT  Goals(current goals can now be found in the care plan section)  Progress towards OT goals: Progressing toward goals  Acute Rehab OT Goals Patient Stated Goal: to improve pain OT Goal Formulation: With patient/family Time For Goal Achievement: 07/31/21 Potential to Achieve Goals: Good ADL Goals Pt Will Perform Grooming: standing;with min assist Pt Will Perform Lower Body Dressing: with min assist;sit to/from stand Pt Will Transfer to Toilet: ambulating;bedside commode;with min assist  Plan Discharge plan remains appropriate;Frequency remains appropriate    Co-evaluation                 AM-PAC OT "6 Clicks" Daily Activity     Outcome Measure   Help from another person eating meals?: A Little Help from another person taking care of personal grooming?: A Lot Help from another person toileting, which includes using toliet, bedpan, or urinal?: A Lot Help from another person bathing (including washing, rinsing, drying)?: A Lot Help from another person to put on and taking off regular upper body clothing?: A Little Help from another person to put on and taking off regular lower body clothing?: A Lot 6 Click Score: 14    End of Session    OT Visit Diagnosis: Other abnormalities of gait and mobility (R26.89);Muscle weakness (generalized) (M62.81)   Activity Tolerance Patient limited by pain   Patient Left in chair;with call bell/phone within reach;with chair alarm set;with family/visitor present   Nurse Communication Mobility status        Time: 5726-2035 OT Time Calculation (min): 19 min  Charges: OT General Charges $OT Visit: 1 Visit OT Treatments $Self Care/Home Management : 8-22 mins  Dessie Coma, M.S. OTR/L  07/18/21, 10:32 AM  ascom [phone]

## 2021-07-18 NOTE — Consult Note (Signed)
Chief Complaint: Patient was seen in consultation today for  Chief Complaint  Patient presents with   Dizziness    Referring Physician(s): Dr. Rickard Patience   Supervising Physician: Mir, Mauri Reading  Patient Status: [facility] - In-pt  History of Present Illness: Natasha [name] is an 81 y.o. female with a medical history significant for atrial fibrillation (Eliquis), HTN, DM, COPD, CKD IV and lung cancer s/p radiation therapy. She recently underwent right-sided carpal tunnel release 07/15/21 and her Eliquis was held for this procedure.  The patient presented to the [facility] ED 07/17/21 after experiencing new onset dizziness and left leg weakness when she attempted to out of bed. 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 Additional imaging ordered also revealed recurrent lung cancer with bone and liver metastases.    CT chest/abdomen/pelvis without contrast Dec 22, 2040 Hepatobiliary: New diffuse hepatic metastatic disease. Numerous small lesions throughout both lobes of the liver. The largest lesion at the right hepatic dome measures 2.5 cm on image 43/2. The gallbladder is surgically absent. No common bile duct dilatation. IMPRESSION: 1. Enlarging left upper lobe/suprahilar mass with associated left hilar and mediastinal adenopathy consistent with recurrent lung cancer. 2. New diffuse hepatic metastatic disease. 3. Suspect scattered subtle slightly sclerotic bone lesions. No lytic or destructive bone lesions. 4. PET-CT may be helpful for accurate staging, if necessary. 5. Stable right adrenal gland nodule. 6. 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  Interventional Radiology has been asked to evaluate this patient for an image-guided liver lesion biopsy. Imaging reviewed and procedure approved by Dr. Bryn Gulling.   Past Medical History:  Diagnosis Date   A-fib [facility])    a.) CHA2DS2-VASc Score = 6 (age x 2,  sex, HTN, aortic plaque, T2DM). 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   Chronic cough    CKD (chronic kidney disease), stage III (HCC)    Complication of anesthesia    COPD (chronic obstructive pulmonary disease) (HCC)    Depression    Diastolic dysfunction    a.) TTE 04/08/2014: EF 60%; mild concentric LVH; G2DD. b.) TTE 12/21/2019: EF 60-65%, LA mildly dilated, mild-mod MR; PASP 36.8; G1DD. 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 b.) treated with SBRT (60 cGy over 5 fractions).   OSA on CPAP    Osteoporosis    Restless leg    Sepsis (HCC)    T2DM (type 2 diabetes mellitus) (HCC)     Past Surgical History:  Procedure Laterality Date   ABDOMINAL HYSTERECTOMY  1978   ANTERIOR INTEROSSEOUS NERVE  DECOMPRESSION Right 07/15/2021   Procedure: Cubital tunnel release;  Surgeon: Hessie Knows, MD;  Location: [facility] ORS;  Service: Orthopedics;  Laterality: Right;   APPENDECTOMY  1978   BREAST BIOPSY Left ?   papilloma   BREAST CYST EXCISION Bilateral yrs ago   benign, scars not well visualized   BREAST SURGERY     CARPAL TUNNEL RELEASE Left 04/22/2021   Procedure: Left carpal tunnel release &  ulnar nerve release at elbow;  Surgeon: Hessie Knows, MD;  Location: [facility] ORS;  Service: Orthopedics;  Laterality: Left;   CARPAL TUNNEL RELEASE Right 07/15/2021   Procedure: CARPAL TUNNEL RELEASE;  Surgeon: Hessie Knows, MD;  Location: [facility] ORS;  Service: Orthopedics;  Laterality: Right;   CATARACT EXTRACTION W/ INTRAOCULAR LENS  IMPLANT, BILATERAL Bilateral    CHOLECYSTECTOMY     COLONOSCOPY     ELBOW SURGERY Right    B[place]worth release   EYE SURGERY     FRACTURE SURGERY     GASTRIC BYPASS  12/22/2017   Roux-N-Y   JOINT REPLACEMENT     KNEE SURGERY Left    tibial fracture with metal plate   TOTAL SHOULDER REPLACEMENT Left 2014   ULNAR TUNNEL RELEASE Left 04/22/2021   Procedure: CUBITAL TUNNEL RELEASE;  Surgeon: Hessie Knows, MD;  Location: [facility] ORS;  Service: Orthopedics;  Laterality: Left;   VIDEO BRONCHOSCOPY WITH ENDOBRONCHIAL NAVIGATION N/A 09/30/2020   Procedure: ROBOTIC ASSISTED VIDEO BRONCHOSCOPY WITH ENDOBRONCHIAL NAVIGATION;  Surgeon: Tyler Pita, MD;  Location: [facility] ORS;  Service: Pulmonary;  Laterality: N/A;   WRIST SURGERY Left    fractures    Allergies: Atorvastatin, Lantus [insulin glargine], Lyrica [pregabalin], and Lisinopril  Medications: Prior to Admission medications   Medication Sig Start Date End Date Taking? Authorizing Provider  chlorhexidine (PERIDEX) 0.12 % solution Rinse mouth with 15 ml for 30 seconds in the morning and evening after brushing. Then spit. 07/08/21  Yes [provider]  HYDROcodone-acetaminophen (NORCO) 7.5-325 MG tablet Take 1 tablet by mouth every 6 (six) hours as needed for moderate pain. 07/15/21  Yes Hessie Knows, MD  amiodarone (PACERONE) 200 MG tablet Take 400 mg by mouth daily. 03/03/21   [provider]  amoxicillin (AMOXIL) 500 MG capsule Take 500 mg by mouth 3 (three) times daily. Take until finished. Patient not taking: Reported on 07/17/2021 07/08/21   [provider]  apixaban (ELIQUIS) 5 MG  TABS tablet Take 1 tablet (5 mg total) by mouth 2 (two) times daily. 02/17/21   Lesleigh Noe, MD  Budeson-Glycopyrrol-Formoterol (BREZTRI AEROSPHERE) 160-9-4.8 MCG/ACT AERO Inhale 160 mcg into the lungs 2 (two) times daily. Patient not taking: Reported on 07/17/2021 06/12/21   Tyler Pita, MD  Calcium Carb-Cholecalciferol (CALCIUM 600+D3 PO) Take 1 tablet by mouth in the morning.    [provider]  Ferrous Gluconate (IRON 27 PO) Take 27 mg by mouth in the morning.    [provider]  gabapentin (NEURONTIN) 100 MG capsule Take by mouth 3 (three) times daily. Take one capsule by mouth nightly for one week. Then increase to 2 capsules nightly for one week. Then 3 capsules nightly.    [provider]  glucose blood (CONTOUR NEXT TEST) test strip Check sugar twice daily DX E11.69 02/24/21   Lesleigh Noe, MD  JANUMET 50-1000 MG tablet Take 2 tablets by mouth in the morning. 02/08/17   [provider]  JARDIANCE 25 MG TABS tablet Take 25 mg by mouth daily. 06/13/21   [provider]  levalbuterol (XOPENEX HFA) 45 MCG/ACT inhaler Inhale 2 puffs into the lungs every 8 (eight) hours as needed for wheezing or shortness of breath (cough). 06/12/21 06/12/22  Tyler Pita, MD  metoprolol succinate (TOPROL-XL) 25 MG 24 hr tablet Take 25 mg by mouth daily. 03/31/21   [provider]  pantoprazole (PROTONIX) 40 MG tablet Take 40 mg by mouth daily. 07/17/21   [provider]  pramipexole (MIRAPEX) 1 MG tablet Take 1 tablet (1 mg total) by mouth at bedtime. Appt with PCP needed for further refills. 11/27/20   Lesleigh Noe, MD  rosuvastatin (CRESTOR) 20 MG tablet Take 1 tablet (20 mg total) by mouth at bedtime. 01/17/21   Lesleigh Noe, MD  sertraline (ZOLOFT) 50 MG tablet Take 1 tablet (50 mg total) by mouth at bedtime. 06/25/21   Lesleigh Noe, MD  spironolactone (ALDACTONE) 25 MG tablet Take 25 mg by mouth daily. 05/01/21   [provider]  TRESIBA FLEXTOUCH 100 UNIT/ML SOPN FlexTouch Pen Inject 15 Units into the skin at bedtime. 03/26/17   [provider]  Vitamin D, Ergocalciferol, (DRISDOL) 1.25 MG (50000 UNIT) CAPS capsule Take 1 capsule (50,000 Units total) by mouth every 7 (seven) days. 07/11/21   Meredith Pel, MD     Family History  Problem Relation Age of Onset   Other Mother        died from surgery   AAA (abdominal aortic aneurysm) Mother    Diabetes Father        controlled by diet   Dementia Father        brain atrophy - unknown origin   Breast cancer Cousin        maternal    Social History   Socioeconomic History   Marital status: Married    Spouse name: Scientist, physiological   Number of children: 1   Years of education: some college   Highest education level: Not on file  Occupational History   Not on file  Tobacco Use   Smoking status: Former    Packs/day: 1.00    Years: 12.00    Pack years: 12.00    Types: Cigarettes    Quit date: 05/26/1975    Years since quitting: 46.1    Passive exposure: Past   Smokeless tobacco: Never  Vaping Use   Vaping Use: Never used  Substance and Sexual Activity   Alcohol use: No    Comment: rarely   Drug use: Never   Sexual activity: Not Currently  Other Topics Concern   Not on file  Social History Narrative   07/22/20   From[place] near [place]   Living: with husband, Marlou Sa [phone])   Work: retired - high end Journalist, newspaper      Family: grandson - Ovid Curd 1998-08-12) (son is deceased) - and living with them      Enjoys: Enjoys Social worker, going to art shows, gardening and painting      Exercise: not currently   Diet: does not follow diabetic diet      Safety   Seat belts: Yes    Guns: Yes  and secure   Safe in relationships: Yes    Social Determinants of Health   Financial Resource Strain: Not on file  Food Insecurity: Not on file  Transportation Needs: Not on file  Physical Activity: Not on file  Stress: Not on  file  Social Connections: Not on file  Review of Systems: A 12 point ROS discussed and pertinent positives are indicated in the HPI above.  All other systems are negative.  Review of Systems  Constitutional:  Positive for fatigue.  Respiratory:  Negative for cough and shortness of breath.   Cardiovascular:  Negative for chest pain and leg swelling.  Gastrointestinal:  Negative for abdominal pain, diarrhea, nausea and vomiting.  Musculoskeletal:  Positive for arthralgias, back pain and myalgias.  Neurological:  Positive for headaches.   Vital Signs: BP (!) 126/57 (BP Location: Left Arm)    Pulse 71    Temp 97.8 F (36.6 C) (Oral)    Resp 16    Ht $R'5\' 4"'rr$  (1.626 m)    Wt 137 lb (62.1 kg)    SpO2 94%    BMI 23.52 kg/m   Physical Exam Constitutional:      General: She is not in acute distress. HENT:     Mouth/Throat:     Mouth: Mucous membranes are moist.     Pharynx: Oropharynx is clear.  Cardiovascular:     Rate and Rhythm: Normal rate and regular rhythm.     Pulses: Normal pulses.     Heart sounds: Normal heart sounds.  Pulmonary:     Effort: Pulmonary effort is normal.     Breath sounds: Normal breath sounds.  Abdominal:     General: Bowel sounds are normal.     Palpations: Abdomen is soft.  Musculoskeletal:     Right lower leg: No edema.     Left lower leg: No edema.  Skin:    General: Skin is warm and dry.  Neurological:     Mental Status: She is alert and oriented to person, place, and time.  Psychiatric:        Mood and Affect: Affect is flat.    Imaging: CT Head Wo Contrast  Result Date: 07/17/2021 CLINICAL DATA:  Nonspecific dizziness EXAM: CT HEAD WITHOUT CONTRAST TECHNIQUE: Contiguous axial images were obtained from the base of the skull through the vertex without intravenous contrast. RADIATION DOSE REDUCTION: This exam was performed according to the departmental dose-optimization program which includes automated exposure control, adjustment of the mA and/or  kV according to patient size and/or use of iterative reconstruction technique. COMPARISON:  03/09/2021 FINDINGS: Brain: 2 small right cerebellar infarcts not seen on prior, possibly recent and symptomatic. Few remote supratentorial white matter insults. No hemorrhage, hydrocephalus, or collection. Age normal brain volume. Vascular: No hyperdense vessel or unexpected calcification. Skull: Normal. Negative for fracture or focal lesion. Sinuses/Orbits: No acute finding. 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 Electronically Signed   By: Jorje Guild M.D.   On: 07/17/2021 04:32   MR ANGIO HEAD WO CONTRAST  Result Date: 07/17/2021 CLINICAL DATA:  Dizziness, infarcts on MRI EXAM: MRA NECK WITHOUT CONTRAST MRA HEAD WITHOUT CONTRAST TECHNIQUE: Angiographic images of the Circle of Willis were acquired using MRA technique without intravenous contrast. COMPARISON:  None. FINDINGS: MRA NECK FINDINGS Standard aortic branching. Common, internal, and external carotid arteries are patent, without hemodynamically significant stenosis. Extracranial vertebral arteries are patent, without hemodynamically significant stenosis, although imaging of the origins is somewhat limited by artifact. MRA HEAD FINDINGS Both internal carotid arteries are patent to the termini, without significant stenosis. A1 segments patent. Normal anterior communicating artery. Anterior cerebral arteries are patent to their distal aspects. No M1 stenosis or occlusion. Normal MCA bifurcations. Distal MCA branches perfused and symmetric. Vertebral arteries  patent to the vertebrobasilar junction without stenosis. Basilar patent to its distal aspect. Superior cerebellar arteries patent bilaterally. Patent P1 segments, diminutive on the right. Near fetal origin of the right PCA with patent right posterior communicating artery. PCAs perfused to their distal aspects without stenosis.  Possible diminutive left posterior communicating artery. IMPRESSION: 1.  No intracranial large vessel occlusion or significant stenosis. 2.  No hemodynamically significant stenosis in the neck. Electronically Signed   By: Merilyn Baba M.D.   On: 07/17/2021 23:08   MR ANGIO NECK WO CONTRAST  Result Date: 07/17/2021 CLINICAL DATA:  Dizziness, infarcts on MRI EXAM: MRA NECK WITHOUT CONTRAST MRA HEAD WITHOUT CONTRAST TECHNIQUE: Angiographic images of the Circle of Willis were acquired using MRA technique without intravenous contrast. COMPARISON:  None. FINDINGS: MRA NECK FINDINGS Standard aortic branching. Common, internal, and external carotid arteries are patent, without hemodynamically significant stenosis. Extracranial vertebral arteries are patent, without hemodynamically significant stenosis, although imaging of the origins is somewhat limited by artifact. MRA HEAD FINDINGS Both internal carotid arteries are patent to the termini, without significant stenosis. A1 segments patent. Normal anterior communicating artery. Anterior cerebral arteries are patent to their distal aspects. No M1 stenosis or occlusion. Normal MCA bifurcations. Distal MCA branches perfused and symmetric. Vertebral arteries patent to the vertebrobasilar junction without stenosis. Basilar patent to its distal aspect. Superior cerebellar arteries patent bilaterally. Patent P1 segments, diminutive on the right. Near fetal origin of the right PCA with patent right posterior communicating artery. PCAs perfused to their distal aspects without stenosis. Possible diminutive left posterior communicating artery. IMPRESSION: 1.  No intracranial large vessel occlusion or significant stenosis. 2.  No hemodynamically significant stenosis in the neck. Electronically Signed   By: Merilyn Baba M.D.   On: 07/17/2021 23:08   MR BRAIN WO CONTRAST  Result Date: 07/17/2021 CLINICAL DATA:  Nonspecific dizziness. EXAM: MRI HEAD WITHOUT CONTRAST TECHNIQUE:  Multiplanar, multiecho pulse sequences of the brain and surrounding structures were obtained without intravenous contrast. COMPARISON:  Head CT from earlier today FINDINGS: Brain: Patchy acute infarcts in the bilateral cerebellum and bilateral frontal, parietal, and occipital convexities. Patchy acute infarct in the right more than left centrum semiovale and in the left caudate head. Mild petechial hemorrhage at the right occipital cortex. No hematoma, hydrocephalus, or collection. Vascular: Normal flow voids Skull and upper cervical spine: New scattered bone lesions in the C2 right articular process, right para median clivus tip, and in the bilateral calvarium, affected areas marked on sagittal T2 weighted imaging. Sinuses/Orbits: Negative IMPRESSION: 1. Numerous small acute infarcts scattered in the brain and compatible with central embolic disease. 2. 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 Recommend metastatic workup. Electronically Signed   By: Jorje Guild M.D.   On: 07/17/2021 05:42   MR Knee Right w/o contrast  Result Date: 06/29/2021 CLINICAL DATA:  Right knee pain for 2 weeks EXAM: MRI OF THE RIGHT KNEE WITHOUT CONTRAST TECHNIQUE: Multiplanar, multisequence MR imaging of the knee was performed. No intravenous contrast was administered. COMPARISON:  None. FINDINGS: MENISCI Medial: Degeneration of the posterior horn of the medial meniscus. No discrete tear. Lateral: Degeneration of the body of the lateral meniscus with a linear component which does not extend to the articular surface. No discrete tear extending to the articular surface. LIGAMENTS Cruciates: ACL and PCL are intact. Collaterals: Medial collateral ligament is intact. Lateral collateral ligament complex is intact. CARTILAGE Patellofemoral: Partial-thickness cartilage loss with areas of full-thickness cartilage loss of the lateral  patellar facet with subchondral reactive marrow changes. Partial-thickness cartilage  loss of the medial patellofemoral compartment. Medial:  No chondral defect. Lateral: Partial-thickness cartilage loss of the weight-bearing surface of the lateral femoral condyle. JOINT: No joint effusion. Normal Hoffa's fat-pad. No plical thickening. POPLITEAL FOSSA: Popliteus tendon is intact. No Baker's cyst. EXTENSOR MECHANISM: Intact quadriceps tendon. Intact patellar tendon. Intact lateral patellar retinaculum. Intact medial patellar retinaculum. Intact MPFL. BONES: No aggressive osseous lesion. No fracture or dislocation. Other: No fluid collection or hematoma. Muscles are normal. IMPRESSION: 1. Partial-thickness cartilage loss with areas of full-thickness cartilage loss of the lateral patellar facet with subchondral reactive marrow changes. Partial-thickness cartilage loss of the medial patellofemoral compartment. 2. Partial-thickness cartilage loss of the weight-bearing surface of the lateral femoral condyle. 3. Degeneration of the body of the lateral meniscus with a linear component which does not extend to the articular surface. No discrete meniscal tear extending to the articular surface. Electronically Signed   By: Kathreen Devoid M.D.   On: 06/29/2021 10:09   ECHOCARDIOGRAM COMPLETE  Result Date: 07/17/2021    ECHOCARDIOGRAM REPORT   Patient Name:   [facility] Date of Exam: 07/17/2021 Medical Rec #:  [id number]     Height:       64.0 in Accession #:    [id number]    Weight:       137.0 lb Date of Birth:  08/02/1940     BSA:          1.666 m Patient Age:    89 years      BP:           131/66 mmHg Patient Gender: F             HR:           67 bpm. Exam Location:  [facility] Procedure: 2D Echo, Cardiac Doppler and Color Doppler Indications:     Stroke I63.9  History:         Patient has prior history of Echocardiogram examinations, most                  recent 02/25/2021. COPD, Arrythmias:Atrial Fibrillation;                  Signs/Symptoms:Murmur.  Sonographer:     Sherrie Sport Referring Phys:  [id number] Ivor Costa  Diagnosing Phys: Ida Rogue MD  Sonographer Comments: No parasternal window and suboptimal apical window. Image acquisition challenging due to COPD. IMPRESSIONS  1. Left ventricular ejection fraction, by estimation, is 60 to 65%. The left ventricle has normal function. The left ventricle has no regional wall motion abnormalities. Left ventricular diastolic parameters are consistent with Grade I diastolic dysfunction (impaired relaxation).  2. Right ventricular systolic function is normal. The right ventricular size is normal. There is mildly elevated pulmonary artery systolic pressure. The estimated right ventricular systolic pressure is 27.7 mmHg.  3. The mitral valve is normal in structure. No evidence of mitral valve regurgitation. No evidence of mitral stenosis.  4. The aortic valve is normal in structure. Aortic valve regurgitation is mild to moderate. No aortic stenosis is present.  5. The inferior vena cava is normal in size with greater than 50% respiratory variability, suggesting right atrial pressure of 3 mmHg. FINDINGS  Left Ventricle: Left ventricular ejection fraction, by estimation, is 60 to 65%. The left ventricle has normal function. The left ventricle has no regional wall motion abnormalities. The left ventricular internal cavity size was normal in size. There is  no left ventricular hypertrophy. Left ventricular diastolic parameters are consistent with Grade I diastolic dysfunction (impaired relaxation). Right Ventricle: The right ventricular size is normal. No increase in right ventricular wall thickness. Right ventricular systolic function is normal. There is mildly elevated pulmonary artery systolic pressure. The tricuspid regurgitant velocity is 3.00  m/s, and with an assumed right atrial pressure of 5 mmHg, the estimated right ventricular systolic pressure is 68.1 mmHg. Left Atrium: Left atrial size was normal in size. Right Atrium: Right atrial size was normal in size. Pericardium: There  is no evidence of pericardial effusion. Mitral Valve: The mitral valve is normal in structure. Mild mitral annular calcification. No evidence of mitral valve regurgitation. No evidence of mitral valve stenosis. MV peak gradient, 5.9 mmHg. The mean mitral valve gradient is 2.0 mmHg. Tricuspid Valve: The tricuspid valve is normal in structure. Tricuspid valve regurgitation is not demonstrated. No evidence of tricuspid stenosis. Aortic Valve: The aortic valve is normal in structure. Aortic valve regurgitation is mild to moderate. No aortic stenosis is present. Aortic valve mean gradient measures 2.5 mmHg. Aortic valve peak gradient measures 4.3 mmHg. Aortic valve area, by VTI measures 3.16 cm. Pulmonic Valve: The pulmonic valve was normal in structure. Pulmonic valve regurgitation is not visualized. No evidence of pulmonic stenosis. Aorta: The aortic root is normal in size and structure. Venous: The inferior vena cava is normal in size with greater than 50% respiratory variability, suggesting right atrial pressure of 3 mmHg. IAS/Shunts: No atrial level shunt detected by color flow Doppler.  LEFT VENTRICLE PLAX 2D LVIDd:         3.54 cm   Diastology LVIDs:         2.34 cm   LV e' medial:    5.22 cm/s LV PW:         0.98 cm   LV E/e' medial:  12.3 LV IVS:        0.89 cm   LV e' lateral:   7.40 cm/s LVOT diam:     2.00 cm   LV E/e' lateral: 8.7 LV SV:         66 LV SV Index:   40 LVOT Area:     3.14 cm  RIGHT VENTRICLE RV Basal diam:  3.40 cm RV S prime:     14.80 cm/s TAPSE (M-mode): 2.7 cm LEFT ATRIUM             Index        RIGHT ATRIUM           Index LA diam:        3.60 cm 2.16 cm/m   RA Area:     18.20 cm LA Vol (A2C):   91.5 ml 54.93 ml/m  RA Volume:   52.90 ml  31.76 ml/m LA Vol (A4C):   62.7 ml 37.64 ml/m LA Biplane Vol: 77.7 ml 46.64 ml/m  AORTIC VALVE AV Area (Vmax):    2.47 cm AV Area (Vmean):   2.53 cm AV Area (VTI):     3.16 cm AV Vmax:           104.05 cm/s AV Vmean:          68.000 cm/s AV  VTI:            0.211 m AV Peak Grad:      4.3 mmHg AV Mean Grad:      2.5 mmHg LVOT Vmax:         81.90 cm/s LVOT Vmean:  54.700 cm/s LVOT VTI:          0.212 m LVOT/AV VTI ratio: 1.00  AORTA Ao Root diam: 2.50 cm MITRAL VALVE                TRICUSPID VALVE MV Area (PHT): 2.76 cm     TR Peak grad:   36.0 mmHg MV Area VTI:   2.13 cm     TR Vmax:        300.00 cm/s MV Peak grad:  5.9 mmHg MV Mean grad:  2.0 mmHg     SHUNTS MV Vmax:       1.21 m/s     Systemic VTI:  0.21 m MV Vmean:      72.7 cm/s    Systemic Diam: 2.00 cm MV Decel Time: 275 msec MV E velocity: 64.30 cm/s MV A velocity: 117.00 cm/s MV E/A ratio:  0.55 Ida Rogue MD Electronically signed by Ida Rogue MD Signature Date/Time: 07/17/2021/4:35:51 PM    Final    Pulmonary Function Test [facility] Only  Result Date: 06/23/2021 Spirometry Data Is Acceptable and Reproducible Consider Mild Obstructive Airways Disease Consider outpatient Pulmonary Consultation if needed Clinical Correlation Advised  XR Knee 1-2 Views Right  Result Date: 06/21/2021 AP lateral radiographs right knee reviewed.  No acute fracture.  Alignment intact.  No significant arthritis or joint space narrowing.  Patella height normal relative to distal femur  CT CHEST ABDOMEN PELVIS WO CONTRAST  Result Date: 07/17/2021 CLINICAL DATA:  Bone lesions seen on brain MRI. Evaluate for underlying malignancy. History of lung cancer. EXAM: CT CHEST, ABDOMEN AND PELVIS WITHOUT CONTRAST TECHNIQUE: Multidetector CT imaging of the chest, abdomen and pelvis was performed following the standard protocol without IV contrast. RADIATION DOSE REDUCTION: This exam was performed according to the departmental dose-optimization program which includes automated exposure control, adjustment of the mA and/or kV according to patient size and/or use of iterative reconstruction technique. COMPARISON:  Chest CT 04/10/2021 FINDINGS: CT CHEST FINDINGS Cardiovascular: The heart is normal in size. No  pericardial effusion. The aorta is normal in caliber. Stable atherosclerotic calcifications. Remarkably no coronary artery calcifications. Mediastinum/Nodes: Progressive left hilar adenopathy the, difficult to measure without contrast. New subcarinal adenopathy with 12.5 mm node on image 28/2. 8.5 mm right paratracheal node on image 20/2. Lungs/Pleura: Enlarging left upper lobe/suprahilar mass measuring approximately 3 cm on image 33/4. Findings consistent with recurrent lung cancer and left hilar and mediastinal adenopathy. No new pulmonary nodules to suggest pulmonary metastatic disease. Progressive right basilar scarring changes and streaky basilar atelectasis. Musculoskeletal: No breast masses are identified. No supraclavicular adenopathy. A few scattered axillary lymph nodes are stable. Suspect scattered subtle slightly sclerotic bone lesions. CT ABDOMEN PELVIS FINDINGS Hepatobiliary: New diffuse hepatic metastatic disease. Numerous small lesions throughout both lobes of the liver. The largest lesion at the right hepatic dome measures 2.5 cm on image 43/2. The gallbladder is surgically absent. No common bile duct dilatation. Pancreas: No mass, inflammation or ductal dilatation. Spleen: Normal size.  No focal lesions. Adrenals/Urinary Tract: Stable right adrenal gland nodule. No worrisome renal lesions are identified without contrast. 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 No complicating features. The small bowel and colon are grossly normal. Vascular/Lymphatic: Stable atherosclerotic calcifications involving the aorta and iliac arteries but no aneurysm. Small scattered mesenteric and retroperitoneal lymph nodes but no mass or overt adenopathy the. Reproductive: Surgically absent. Other: No pelvic mass or adenopathy. No free pelvic fluid collections. No inguinal mass or adenopathy. No  abdominal wall hernia or subcutaneous lesions. Musculoskeletal: No lytic destructive bone lesions.  No spinal canal compromise. IMPRESSION: 1. Enlarging left upper lobe/suprahilar mass with associated left hilar and mediastinal adenopathy consistent with recurrent lung cancer. 2. New diffuse hepatic metastatic disease. 3. Suspect scattered subtle slightly sclerotic bone lesions. No lytic or destructive bone lesions. 4. PET-CT may be helpful for accurate staging, if necessary. 5. Stable right adrenal gland nodule. 6. 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 * onc * Aortic Atherosclerosis (ICD10-I70.0). Electronically Signed   By: Marijo Sanes M.D.   On: 07/17/2021 10:58    Labs:  CBC: Recent Labs    02/25/21 0416 03/09/21 1656 06/16/21 1039 07/17/21 0352  WBC 4.3 9.8 8.6 11.9*  HGB 9.8* 11.0* 10.8* 9.5*  HCT 29.4* 32.8* 33.6* 29.1*  PLT 195 264 485.0* 131*    COAGS: Recent Labs    07/17/21 0352  INR 1.3*  APTT 32    BMP: Recent Labs    02/24/21 1424 02/25/21 0416 03/09/21 1656 04/10/21 1019 06/16/21 1039 06/17/21 1533 06/23/21 1526 07/17/21 0352  NA 135 133* 137  --  134*  --  133* 135  K 4.9 4.7 3.8  --  5.2 No hemolysis seen* 4.8 5.0 4.7  CL 106 104 108  --  101  --  101 103  CO2 25 21* 19*  --  22  --  24 21*  GLUCOSE 133* 422* 198*  --  185*  --  248* 241*  BUN 23 28* 23  --  30*  --  31* 37*  CALCIUM 9.1 8.3* 8.3*  --  9.2  --  9.0 8.5*  CREATININE 1.62* 1.56* 1.57* 1.70* 2.00*  --  1.84* 1.56*  GFRNONAA 32* 33* 33*  --   --   --   --  33*    LIVER FUNCTION TESTS: Recent Labs    07/22/20 1511 02/24/21 1424 06/16/21 1039  BILITOT 0.3 0.8 0.4  AST 44* 31 18  ALT $Re'30 18 13  'VhX$ ALKPHOS 77 54 115  PROT 6.1 6.9 6.5  ALBUMIN 4.1 4.1 3.9    TUMOR MARKERS: No results for input(s): AFPTM, CEA, CA199, CHROMGRNA in the last 8760 hours.  Assessment and Plan:  History of lung cancer with suspected bone and liver metastases: Mattye Verdone, 81 year old female, is tentatively scheduled 07/21/21 for an image-guided liver lesion biopsy. The procedure was  discussed with the patient and her husband at the bedside.   Risks and benefits of this procedure discussed with the patient and/or patient's family including, but not limited to bleeding, infection, damage to adjacent structures or low yield requiring additional tests.  All of the questions were answered and there is agreement to proceed. She will be NPO at midnight 07/21/21. CBC and PT/INR ordered. Her last dose of Eliquis was prior to arrival; last dose of Aspirin 325 mg was this morning. Aspirin is now on hold.   Consent signed and in chart.  Thank you for this interesting consult.  I greatly enjoyed meeting Saron Vanorman and look forward to participating in their care.  A copy of this report was sent to the requesting provider on this date.  Electronically Signed: Soyla Dryer, AGACNP-BC [phone] 07/18/2021, 3:21 PM   I spent a total of 20 Minutes    in face to face in clinical consultation, greater than 50% of which was counseling/coordinating care for liver lesion biopsy

## 2021-07-19 DIAGNOSIS — I639 Cerebral infarction, unspecified: Secondary | ICD-10-CM | POA: Diagnosis not present

## 2021-07-19 DIAGNOSIS — M899 Disorder of bone, unspecified: Secondary | ICD-10-CM | POA: Diagnosis not present

## 2021-07-19 DIAGNOSIS — I480 Paroxysmal atrial fibrillation: Secondary | ICD-10-CM | POA: Diagnosis not present

## 2021-07-19 LAB — COMPREHENSIVE METABOLIC PANEL
ALT: 23 U/L (ref 0–44)
AST: 52 U/L — ABNORMAL HIGH (ref 15–41)
Albumin: 2.6 g/dL — ABNORMAL LOW (ref 3.5–5.0)
Alkaline Phosphatase: 204 U/L — ABNORMAL HIGH (ref 38–126)
Anion gap: 13 (ref 5–15)
BUN: 34 mg/dL — ABNORMAL HIGH (ref 8–23)
CO2: 18 mmol/L — ABNORMAL LOW (ref 22–32)
Calcium: 8.3 mg/dL — ABNORMAL LOW (ref 8.9–10.3)
Chloride: 104 mmol/L (ref 98–111)
Creatinine, Ser: 1.43 mg/dL — ABNORMAL HIGH (ref 0.44–1.00)
GFR, Estimated: 37 mL/min — ABNORMAL LOW (ref >60)
Glucose, Bld: 155 mg/dL — ABNORMAL HIGH (ref 70–99)
Potassium: 4.2 mmol/L (ref 3.5–5.1)
Sodium: 135 mmol/L (ref 135–145)
Total Bilirubin: 0.7 mg/dL (ref 0.3–1.2)
Total Protein: 5.5 g/dL — ABNORMAL LOW (ref 6.5–8.1)

## 2021-07-19 LAB — GLUCOSE, CAPILLARY
Glucose-Capillary: 164 mg/dL — ABNORMAL HIGH (ref 70–99)
Glucose-Capillary: 247 mg/dL — ABNORMAL HIGH (ref 70–99)
Glucose-Capillary: 210 mg/dL — ABNORMAL HIGH (ref 70–99)
Glucose-Capillary: 148 mg/dL — ABNORMAL HIGH (ref 70–99)

## 2021-07-19 LAB — CBC
HCT: 28.4 % — ABNORMAL LOW (ref 36.0–46.0)
Hemoglobin: 9 g/dL — ABNORMAL LOW (ref 12.0–15.0)
MCH: 27.9 pg (ref 26.0–34.0)
MCHC: 31.7 g/dL (ref 30.0–36.0)
MCV: 87.9 fL (ref 80.0–100.0)
Platelets: 112 10*3/uL — ABNORMAL LOW (ref 150–400)
RBC: 3.23 MIL/uL — ABNORMAL LOW (ref 3.87–5.11)
RDW: 16.4 % — ABNORMAL HIGH (ref 11.5–15.5)
WBC: 11 10*3/uL — ABNORMAL HIGH (ref 4.0–10.5)
nRBC: 0 % (ref 0.0–0.2)

## 2021-07-19 NOTE — Progress Notes (Signed)
PT Cancellation Note  Patient Details Name: Natasha [name] MRN: [id number] DOB: Jan 12, 1941   Cancelled Treatment:    [place] and liver biopsy ordered. PT will await result to rule out DVT prior to working with pt. Will continue to follow closely and return later this date once cleared for safe participation.     Willette Pa 07/19/2021, 7:56 AM

## 2021-07-19 NOTE — Progress Notes (Signed)
PT Cancellation Note  Patient Details Name: Natasha [name] MRN: [id number] DOB: [birth year]   Cancelled Treatment:     PT attempt. [place] order never released from two days prior. Pt is however going to have liver biopsy Monday. Pt is extremely lethargic throughout the day today. Unable to stay awake to participate. Supportive spouse is present and does request to hold PT session so she can rest. Pt does wake up and able to communicate but very minimally before falling back to sleep.     Willette Pa 07/19/2021, 4:17 PM

## 2021-07-19 NOTE — TOC Progression Note (Signed)
Transition of Care [facility]) - Progression Note    Patient Details  Name: Natasha [name] MRN: [id number] Date of Birth: 05/25/1941  Transition [facility] CM/SW Contact  Eileen Stanford, LCSW Phone Number: 07/19/2021, 2:21 PM  Clinical Narrative:  [facility] recommending consult on 23rd.  CSW spoke with pt's spouse and he is stating they definitely want patient to go to CIR. Pt is having biopsy Monday. CSW will continue to follow.          Expected Discharge Plan and Services                                                 Social Determinants of Health (SDOH) Interventions    Readmission Risk Interventions No flowsheet data found.

## 2021-07-19 NOTE — Progress Notes (Addendum)
PROGRESS NOTE    Natasha [name]  EUM:[id number] DOB: [birth year] DOA: 07/17/2021 PCP: Lesleigh Noe, MD  Assessment & Plan:   Principal Problem:   Acute embolic stroke [facility]) Active Problems:   Essential hypertension   Iron deficiency anemia   OSA (obstructive sleep apnea)   Paroxysmal atrial fibrillation (HCC)   Restless leg syndrome   CKD (chronic kidney disease) stage 4, GFR 15-29 ml/min (HCC)   HLD (hyperlipidemia)   Type II diabetes mellitus with renal manifestations (HCC)   COPD (chronic obstructive pulmonary disease) (HCC)   Chronic diastolic CHF (congestive heart failure) (HCC)   Leukocytosis   Bone lesion   Lung cancer (HCC)   Carpal tunnel syndrome on right   Lesion of liver   CVA (cerebral vascular accident) ([place])   Acute embolic stroke:  continue to hold eliquis, aspirin as per neuro. Continue  on statin. Echo shows EF 54-00%, grade I diastolic dysfunction, no regional wall motion abnormalities, no atrial level shunt detected. Was on eliquis for A-fib which is on hold for recent carpal tunnel release procedure.   HTN: will continue on metoprolol, amiodarone & restart aldactone tomorrow   IDA: continue on iron supplement   OSA: continue on CPAP    PAF: continue on metoprolol, amiodarone. Can restart eliquis on 07/22/21 as per neuro    Restless leg syndrome: continue on home dose of pramipexole   CKDIV: Cr is trending down from day prior    HLD: continue on statin    DM2: HbA1c 9.4, poorly controlled. Continue on SSI w/ accuchecks    COPD: w/o exacerbation. Continue on bronchodilators     Chronic diastolic QQP:YPPJ on 01/25/2670 showed EF of 60 to 65% with grade 1 diastolic dysfunction.CHF seem to be compensated. Restart aldactone tomorrow    Leukocytosis: likely reactive. Will continue to monitor     Bone lesion and history of lung cancer : s/p hx of  radiation therapy.  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 Biopsy will be on 07/21/21 as aspirin needs to be held for a couple of days as per IR   Carpal tunnel syndrome on right:s/p of carpal tunnel release procedure by Dr. Rudene Christians on 07/15/21.  Thrombocytopenia: etiology unclear. Will continue to monitor   DVT prophylaxis: SCDs Code Status: full Family Communication: discussed pt's care w/ pt's husband at bedside and answered his questions  Disposition Plan: likely d/c to CIR as pt and pt's husband are now agreeable to going to CIR   Level of care: Telemetry Medical  Status is: Inpatient Remains inpatient appropriate because: embolic CVA and will have biopsy on 07/21/21      Consultants:  Onco Neuro   Procedures:   Antimicrobials:    Subjective: Pt c/o b/l shin pain   Objective: Vitals:   07/18/21 2216 07/19/21 0019 07/19/21 0500 07/19/21 0531  BP:  (!) 112/50  (!) 119/54  Pulse:  72  72  Resp: (!) $RemoveB'24 18  16  'eGXPiaBE$ Temp:  97.7 F (36.5 C)  99 F (37.2 C)  TempSrc:  Oral  Oral  SpO2:  92%  94%  Weight:   61.4 kg   Height:        Intake/Output Summary (Last 24 hours) at 07/19/2021 0746 Last data filed at 07/18/2021 1740 Gross per 24 hour  Intake --  Output 1100 ml  Net -1100 ml   Filed Weights   07/17/21 0346 07/19/21 0500  Weight: 62.1 kg  61.4 kg    Examination:  General exam: Appears uncomfortable Respiratory system: clear breath sounds b/l  Cardiovascular system: S1/S2+. No gallops or rubs  Gastrointestinal system: Abd is soft, NT, ND & normal bowel sounds  Central nervous system: Alert and awake. Moves all extremities  Psychiatry: judgement and insight appear at baseline. Flat mood and affect      Data Reviewed: I have personally reviewed following labs and imaging studies  CBC: Recent Labs  Lab 07/17/21 0352 07/19/21 0648  WBC 11.9* 11.0*  HGB 9.5* 9.0*  HCT 29.1* 28.4*  MCV 86.9 87.9  PLT 131* 003*   Basic Metabolic Panel: Recent Labs  Lab 07/17/21 0352 07/19/21 0648  NA  135 135  K 4.7 4.2  CL 103 104  CO2 21* 18*  GLUCOSE 241* 155*  BUN 37* 34*  CREATININE 1.56* 1.43*  CALCIUM 8.5* 8.3*   GFR: Estimated Creatinine Clearance: 26.6 mL/min (A) (by C-G formula based on SCr of 1.43 mg/dL (H)). Liver Function Tests: Recent Labs  Lab 07/19/21 0648  AST 52*  ALT 23  ALKPHOS 204*  BILITOT 0.7  PROT 5.5*  ALBUMIN 2.6*   No results for input(s): LIPASE, AMYLASE in the last 168 hours. No results for input(s): AMMONIA in the last 168 hours. Coagulation Profile: Recent Labs  Lab 07/17/21 0352  INR 1.3*   Cardiac Enzymes: No results for input(s): CKTOTAL, CKMB, CKMBINDEX, TROPONINI in the last 168 hours. BNP (last 3 results) No results for input(s): PROBNP in the last 8760 hours. HbA1C: Recent Labs    07/17/21 0352  HGBA1C 8.3*   CBG: Recent Labs  Lab 07/17/21 2037 07/18/21 0747 07/18/21 1207 07/18/21 1717 07/18/21 2113  GLUCAP 166* 171* 137* 110* 193*   Lipid Profile: Recent Labs    07/17/21 0352  CHOL 100  HDL 30*  LDLCALC 30  TRIG 198*  CHOLHDL 3.3   Thyroid Function Tests: Recent Labs    07/18/21 0431  TSH 4.974*   Anemia Panel: Recent Labs    07/18/21 0431  VITAMINB12 834  FOLATE 10.0  FERRITIN 242  TIBC 297  IRON 23*  RETICCTPCT 2.5   Sepsis Labs: No results for input(s): PROCALCITON, LATICACIDVEN in the last 168 hours.  Recent Results (from the past 240 hour(s))  Resp Panel by RT-PCR (Flu A&B, Covid) Nasopharyngeal Swab     Status: None   Collection Time: 07/17/21  6:57 AM   Specimen: Nasopharyngeal Swab; Nasopharyngeal(NP) swabs in vial transport medium  Result Value Ref Range Status   SARS Coronavirus 2 by RT PCR NEGATIVE NEGATIVE Final    Comment: (NOTE) SARS-CoV-2 target nucleic acids are NOT DETECTED.  The SARS-CoV-2 RNA is generally detectable in upper respiratory specimens during the acute phase of infection. The lowest concentration of SARS-CoV-2 viral copies this assay can detect is 138  copies/mL. A negative result does not preclude SARS-Cov-2 infection and should not be used as the sole basis for treatment or other patient management decisions. A negative result may occur with  improper specimen collection/handling, submission of specimen other than nasopharyngeal swab, presence of viral mutation(s) within the areas targeted by this assay, and inadequate number of viral copies(<138 copies/mL). A negative result must be combined with clinical observations, patient history, and epidemiological information. The expected result is Negative.  Fact Sheet for Patients:  EntrepreneurPulse.com.au  Fact Sheet for Healthcare Providers:  IncredibleEmployment.be  This test is no t yet approved or cleared by the [place] and  has been authorized for  detection and/or diagnosis of SARS-CoV-2 by FDA under an Emergency Use Authorization (EUA). This EUA will remain  in effect (meaning this test can be used) for the duration of the COVID-19 declaration under Section 564(b)(1) of the Act, 21 U.S.C.section 360bbb-3(b)(1), unless the authorization is terminated  or revoked sooner.       Influenza A by PCR NEGATIVE NEGATIVE Final   Influenza B by PCR NEGATIVE NEGATIVE Final    Comment: (NOTE) The Xpert Xpress SARS-CoV-2/FLU/RSV plus assay is intended as an aid in the diagnosis of influenza from Nasopharyngeal swab specimens and should not be used as a sole basis for treatment. Nasal washings and aspirates are unacceptable for Xpert Xpress SARS-CoV-2/FLU/RSV testing.  Fact Sheet for Patients: EntrepreneurPulse.com.au  Fact Sheet for Healthcare Providers: IncredibleEmployment.be  This test is not yet approved or cleared by the [place] FDA and has been authorized for detection and/or diagnosis of SARS-CoV-2 by FDA under an Emergency Use Authorization (EUA). This EUA will remain in effect (meaning  this test can be used) for the duration of the COVID-19 declaration under Section 564(b)(1) of the Act, 21 U.S.C. section 360bbb-3(b)(1), unless the authorization is terminated or revoked.  Performed at [facility], [address] [place], [place] [address]          Radiology Studies: MR ANGIO HEAD WO CONTRAST  Result Date: 07/17/2021 CLINICAL DATA:  Dizziness, infarcts on MRI EXAM: MRA NECK WITHOUT CONTRAST MRA HEAD WITHOUT CONTRAST TECHNIQUE: Angiographic images of the Circle of Willis were acquired using MRA technique without intravenous contrast. COMPARISON:  None. FINDINGS: MRA NECK FINDINGS Standard aortic branching. Common, internal, and external carotid arteries are patent, without hemodynamically significant stenosis. Extracranial vertebral arteries are patent, without hemodynamically significant stenosis, although imaging of the origins is somewhat limited by artifact. MRA HEAD FINDINGS Both internal carotid arteries are patent to the termini, without significant stenosis. A1 segments patent. Normal anterior communicating artery. Anterior cerebral arteries are patent to their distal aspects. No M1 stenosis or occlusion. Normal MCA bifurcations. Distal MCA branches perfused and symmetric. Vertebral arteries patent to the vertebrobasilar junction without stenosis. Basilar patent to its distal aspect. Superior cerebellar arteries patent bilaterally. Patent P1 segments, diminutive on the right. Near fetal origin of the right PCA with patent right posterior communicating artery. PCAs perfused to their distal aspects without stenosis. Possible diminutive left posterior communicating artery. IMPRESSION: 1.  No intracranial large vessel occlusion or significant stenosis. 2.  No hemodynamically significant stenosis in the neck. Electronically Signed   By: Merilyn Baba M.D.   On: 07/17/2021 23:08   MR ANGIO NECK WO CONTRAST  Result Date: 07/17/2021 CLINICAL DATA:  Dizziness,  infarcts on MRI EXAM: MRA NECK WITHOUT CONTRAST MRA HEAD WITHOUT CONTRAST TECHNIQUE: Angiographic images of the Circle of Willis were acquired using MRA technique without intravenous contrast. COMPARISON:  None. FINDINGS: MRA NECK FINDINGS Standard aortic branching. Common, internal, and external carotid arteries are patent, without hemodynamically significant stenosis. Extracranial vertebral arteries are patent, without hemodynamically significant stenosis, although imaging of the origins is somewhat limited by artifact. MRA HEAD FINDINGS Both internal carotid arteries are patent to the termini, without significant stenosis. A1 segments patent. Normal anterior communicating artery. Anterior cerebral arteries are patent to their distal aspects. No M1 stenosis or occlusion. Normal MCA bifurcations. Distal MCA branches perfused and symmetric. Vertebral arteries patent to the vertebrobasilar junction without stenosis. Basilar patent to its distal aspect. Superior cerebellar arteries patent bilaterally. Patent P1 segments, diminutive on the right. Near fetal  origin of the right PCA with patent right posterior communicating artery. PCAs perfused to their distal aspects without stenosis. Possible diminutive left posterior communicating artery. IMPRESSION: 1.  No intracranial large vessel occlusion or significant stenosis. 2.  No hemodynamically significant stenosis in the neck. Electronically Signed   By: Merilyn Baba M.D.   On: 07/17/2021 23:08   ECHOCARDIOGRAM COMPLETE  Result Date: 07/17/2021    ECHOCARDIOGRAM REPORT   Patient Name:   [facility] Date of Exam: 07/17/2021 Medical Rec #:  [id number]     Height:       64.0 in Accession #:    [id number]    Weight:       137.0 lb Date of Birth:  11/14/1940     BSA:          1.666 m Patient Age:    39 years      BP:           131/66 mmHg Patient Gender: F             HR:           67 bpm. Exam Location:  [facility] Procedure: 2D Echo, Cardiac Doppler and Color Doppler  Indications:     Stroke I63.9  History:         Patient has prior history of Echocardiogram examinations, most                  recent 02/25/2021. COPD, Arrythmias:Atrial Fibrillation;                  Signs/Symptoms:Murmur.  Sonographer:     Sherrie Sport Referring Phys:  [id number] Ivor Costa Diagnosing Phys: Ida Rogue MD  Sonographer Comments: No parasternal window and suboptimal apical window. Image acquisition challenging due to COPD. IMPRESSIONS  1. Left ventricular ejection fraction, by estimation, is 60 to 65%. The left ventricle has normal function. The left ventricle has no regional wall motion abnormalities. Left ventricular diastolic parameters are consistent with Grade I diastolic dysfunction (impaired relaxation).  2. Right ventricular systolic function is normal. The right ventricular size is normal. There is mildly elevated pulmonary artery systolic pressure. The estimated right ventricular systolic pressure is 99.2 mmHg.  3. The mitral valve is normal in structure. No evidence of mitral valve regurgitation. No evidence of mitral stenosis.  4. The aortic valve is normal in structure. Aortic valve regurgitation is mild to moderate. No aortic stenosis is present.  5. The inferior vena cava is normal in size with greater than 50% respiratory variability, suggesting right atrial pressure of 3 mmHg. FINDINGS  Left Ventricle: Left ventricular ejection fraction, by estimation, is 60 to 65%. The left ventricle has normal function. The left ventricle has no regional wall motion abnormalities. The left ventricular internal cavity size was normal in size. There is  no left ventricular hypertrophy. Left ventricular diastolic parameters are consistent with Grade I diastolic dysfunction (impaired relaxation). Right Ventricle: The right ventricular size is normal. No increase in right ventricular wall thickness. Right ventricular systolic function is normal. There is mildly elevated pulmonary artery systolic pressure.  The tricuspid regurgitant velocity is 3.00  m/s, and with an assumed right atrial pressure of 5 mmHg, the estimated right ventricular systolic pressure is 42.6 mmHg. Left Atrium: Left atrial size was normal in size. Right Atrium: Right atrial size was normal in size. Pericardium: There is no evidence of pericardial effusion. Mitral Valve: The mitral valve is normal in structure. Mild mitral annular calcification. No evidence  of mitral valve regurgitation. No evidence of mitral valve stenosis. MV peak gradient, 5.9 mmHg. The mean mitral valve gradient is 2.0 mmHg. Tricuspid Valve: The tricuspid valve is normal in structure. Tricuspid valve regurgitation is not demonstrated. No evidence of tricuspid stenosis. Aortic Valve: The aortic valve is normal in structure. Aortic valve regurgitation is mild to moderate. No aortic stenosis is present. Aortic valve mean gradient measures 2.5 mmHg. Aortic valve peak gradient measures 4.3 mmHg. Aortic valve area, by VTI measures 3.16 cm. Pulmonic Valve: The pulmonic valve was normal in structure. Pulmonic valve regurgitation is not visualized. No evidence of pulmonic stenosis. Aorta: The aortic root is normal in size and structure. Venous: The inferior vena cava is normal in size with greater than 50% respiratory variability, suggesting right atrial pressure of 3 mmHg. IAS/Shunts: No atrial level shunt detected by color flow Doppler.  LEFT VENTRICLE PLAX 2D LVIDd:         3.54 cm   Diastology LVIDs:         2.34 cm   LV e' medial:    5.22 cm/s LV PW:         0.98 cm   LV E/e' medial:  12.3 LV IVS:        0.89 cm   LV e' lateral:   7.40 cm/s LVOT diam:     2.00 cm   LV E/e' lateral: 8.7 LV SV:         66 LV SV Index:   40 LVOT Area:     3.14 cm  RIGHT VENTRICLE RV Basal diam:  3.40 cm RV S prime:     14.80 cm/s TAPSE (M-mode): 2.7 cm LEFT ATRIUM             Index        RIGHT ATRIUM           Index LA diam:        3.60 cm 2.16 cm/m   RA Area:     18.20 cm LA Vol (A2C):   91.5 ml  54.93 ml/m  RA Volume:   52.90 ml  31.76 ml/m LA Vol (A4C):   62.7 ml 37.64 ml/m LA Biplane Vol: 77.7 ml 46.64 ml/m  AORTIC VALVE AV Area (Vmax):    2.47 cm AV Area (Vmean):   2.53 cm AV Area (VTI):     3.16 cm AV Vmax:           104.05 cm/s AV Vmean:          68.000 cm/s AV VTI:            0.211 m AV Peak Grad:      4.3 mmHg AV Mean Grad:      2.5 mmHg LVOT Vmax:         81.90 cm/s LVOT Vmean:        54.700 cm/s LVOT VTI:          0.212 m LVOT/AV VTI ratio: 1.00  AORTA Ao Root diam: 2.50 cm MITRAL VALVE                TRICUSPID VALVE MV Area (PHT): 2.76 cm     TR Peak grad:   36.0 mmHg MV Area VTI:   2.13 cm     TR Vmax:        300.00 cm/s MV Peak grad:  5.9 mmHg MV Mean grad:  2.0 mmHg     SHUNTS MV Vmax:  1.21 m/s     Systemic VTI:  0.21 m MV Vmean:      72.7 cm/s    Systemic Diam: 2.00 cm MV Decel Time: 275 msec MV E velocity: 64.30 cm/s MV A velocity: 117.00 cm/s MV E/A ratio:  0.55 Ida Rogue MD Electronically signed by Ida Rogue MD Signature Date/Time: 07/17/2021/4:35:51 PM    Final    CT CHEST ABDOMEN PELVIS WO CONTRAST  Result Date: 07/17/2021 CLINICAL DATA:  Bone lesions seen on brain MRI. Evaluate for underlying malignancy. History of lung cancer. EXAM: CT CHEST, ABDOMEN AND PELVIS WITHOUT CONTRAST TECHNIQUE: Multidetector CT imaging of the chest, abdomen and pelvis was performed following the standard protocol without IV contrast. RADIATION DOSE REDUCTION: This exam was performed according to the departmental dose-optimization program which includes automated exposure control, adjustment of the mA and/or kV according to patient size and/or use of iterative reconstruction technique. COMPARISON:  Chest CT 04/10/2021 FINDINGS: CT CHEST FINDINGS Cardiovascular: The heart is normal in size. No pericardial effusion. The aorta is normal in caliber. Stable atherosclerotic calcifications. Remarkably no coronary artery calcifications. Mediastinum/Nodes: Progressive left hilar adenopathy  the, difficult to measure without contrast. New subcarinal adenopathy with 12.5 mm node on image 28/2. 8.5 mm right paratracheal node on image 20/2. Lungs/Pleura: Enlarging left upper lobe/suprahilar mass measuring approximately 3 cm on image 33/4. Findings consistent with recurrent lung cancer and left hilar and mediastinal adenopathy. No new pulmonary nodules to suggest pulmonary metastatic disease. Progressive right basilar scarring changes and streaky basilar atelectasis. Musculoskeletal: No breast masses are identified. No supraclavicular adenopathy. A few scattered axillary lymph nodes are stable. Suspect scattered subtle slightly sclerotic bone lesions. CT ABDOMEN PELVIS FINDINGS Hepatobiliary: New diffuse hepatic metastatic disease. Numerous small lesions throughout both lobes of the liver. The largest lesion at the right hepatic dome measures 2.5 cm on image 43/2. The gallbladder is surgically absent. No common bile duct dilatation. Pancreas: No mass, inflammation or ductal dilatation. Spleen: Normal size.  No focal lesions. Adrenals/Urinary Tract: Stable right adrenal gland nodule. No worrisome renal lesions are identified without contrast. 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 No complicating features. The small bowel and colon are grossly normal. Vascular/Lymphatic: Stable atherosclerotic calcifications involving the aorta and iliac arteries but no aneurysm. Small scattered mesenteric and retroperitoneal lymph nodes but no mass or overt adenopathy the. Reproductive: Surgically absent. Other: No pelvic mass or adenopathy. No free pelvic fluid collections. No inguinal mass or adenopathy. No abdominal wall hernia or subcutaneous lesions. Musculoskeletal: No lytic destructive bone lesions. No spinal canal compromise. IMPRESSION: 1. Enlarging left upper lobe/suprahilar mass with associated left hilar and mediastinal adenopathy consistent with recurrent lung cancer. 2. New diffuse  hepatic metastatic disease. 3. Suspect scattered subtle slightly sclerotic bone lesions. No lytic or destructive bone lesions. 4. PET-CT may be helpful for accurate staging, if necessary. 5. Stable right adrenal gland nodule. 6. 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 * onc * Aortic Atherosclerosis (ICD10-I70.0). Electronically Signed   By: Marijo Sanes M.D.   On: 07/17/2021 10:58        Scheduled Meds:   stroke: mapping our early stages of recovery book   Does not apply Once   amiodarone  400 mg Oral Daily   calcium-vitamin D  1 tablet Oral Daily   chlorhexidine  5 mL Mouth/Throat BID   ferrous gluconate  324 mg Oral q AM   gabapentin  300 mg Oral QHS   insulin aspart  0-5 Units Subcutaneous QHS  insulin aspart  0-9 Units Subcutaneous TID WC   metoprolol succinate  25 mg Oral Daily   pantoprazole  40 mg Oral Daily   pramipexole  1 mg Oral QHS   rosuvastatin  20 mg Oral QHS   sertraline  50 mg Oral QHS   Continuous Infusions:   LOS: 1 day    Time spent: 25 mins    Wyvonnia Dusky, MD [facility] Pager [phone]  If 7PM-7AM, please contact night-coverage 07/19/2021, 7:46 AM

## 2021-07-20 DIAGNOSIS — M899 Disorder of bone, unspecified: Secondary | ICD-10-CM | POA: Diagnosis not present

## 2021-07-20 DIAGNOSIS — N184 Chronic kidney disease, stage 4 (severe): Secondary | ICD-10-CM | POA: Diagnosis not present

## 2021-07-20 DIAGNOSIS — I639 Cerebral infarction, unspecified: Secondary | ICD-10-CM | POA: Diagnosis not present

## 2021-07-20 LAB — CBC
HCT: 29 % — ABNORMAL LOW (ref 36.0–46.0)
Hemoglobin: 9.3 g/dL — ABNORMAL LOW (ref 12.0–15.0)
MCH: 27.9 pg (ref 26.0–34.0)
MCHC: 32.1 g/dL (ref 30.0–36.0)
MCV: 87.1 fL (ref 80.0–100.0)
Platelets: 98 10*3/uL — ABNORMAL LOW (ref 150–400)
RBC: 3.33 MIL/uL — ABNORMAL LOW (ref 3.87–5.11)
RDW: 16.6 % — ABNORMAL HIGH (ref 11.5–15.5)
WBC: 11.9 10*3/uL — ABNORMAL HIGH (ref 4.0–10.5)
nRBC: 0 % (ref 0.0–0.2)

## 2021-07-20 LAB — COMPREHENSIVE METABOLIC PANEL
ALT: 23 U/L (ref 0–44)
AST: 47 U/L — ABNORMAL HIGH (ref 15–41)
Albumin: 2.5 g/dL — ABNORMAL LOW (ref 3.5–5.0)
Alkaline Phosphatase: 189 U/L — ABNORMAL HIGH (ref 38–126)
Anion gap: 9 (ref 5–15)
BUN: 42 mg/dL — ABNORMAL HIGH (ref 8–23)
CO2: 21 mmol/L — ABNORMAL LOW (ref 22–32)
Calcium: 8.2 mg/dL — ABNORMAL LOW (ref 8.9–10.3)
Chloride: 104 mmol/L (ref 98–111)
Creatinine, Ser: 1.58 mg/dL — ABNORMAL HIGH (ref 0.44–1.00)
GFR, Estimated: 33 mL/min — ABNORMAL LOW (ref >60)
Glucose, Bld: 194 mg/dL — ABNORMAL HIGH (ref 70–99)
Potassium: 4.2 mmol/L (ref 3.5–5.1)
Sodium: 134 mmol/L — ABNORMAL LOW (ref 135–145)
Total Bilirubin: 0.7 mg/dL (ref 0.3–1.2)
Total Protein: 5.1 g/dL — ABNORMAL LOW (ref 6.5–8.1)

## 2021-07-20 LAB — GLUCOSE, CAPILLARY
Glucose-Capillary: 229 mg/dL — ABNORMAL HIGH (ref 70–99)
Glucose-Capillary: 172 mg/dL — ABNORMAL HIGH (ref 70–99)
Glucose-Capillary: 132 mg/dL — ABNORMAL HIGH (ref 70–99)
Glucose-Capillary: 149 mg/dL — ABNORMAL HIGH (ref 70–99)

## 2021-07-20 NOTE — Progress Notes (Signed)
PROGRESS NOTE    Natasha [name]  TIW:[id number] DOB: 27-Apr-1941 DOA: 07/17/2021 PCP: Lesleigh Noe, MD  Assessment & Plan:   Principal Problem:   Acute embolic stroke [facility]) Active Problems:   Essential hypertension   Iron deficiency anemia   OSA (obstructive sleep apnea)   Paroxysmal atrial fibrillation (HCC)   Restless leg syndrome   CKD (chronic kidney disease) stage 4, GFR 15-29 ml/min (HCC)   HLD (hyperlipidemia)   Type II diabetes mellitus with renal manifestations (HCC)   COPD (chronic obstructive pulmonary disease) (HCC)   Chronic diastolic CHF (congestive heart failure) (HCC)   Leukocytosis   Bone lesion   Lung cancer (HCC)   Carpal tunnel syndrome on right   Lesion of liver   CVA (cerebral vascular accident) ([place])   Acute embolic stroke:  continue to hold eliquis, aspirin as per neuro. Continue  on statin. Echo shows EF 25-05%, grade I diastolic dysfunction, no regional wall motion abnormalities, no atrial level shunt detected. Was on eliquis for A-fib which is on hold for recent carpal tunnel release procedure.   HTN: continue on amiodarone, metoprolol. Continue to hold aldactone secondary to low normal BP    IDA: continue on iron supplement   OSA: continue on CPAP    PAF: continue on metoprolol, amiodarone. Can restart eliquis on 07/22/21 as per neuro    Restless leg syndrome: continue on home dose of pramipexole  continue on home dose of pramipexole   CKDIV: Cr is labile. Avoid nephrotoxic meds    HLD: continue on statin    DM2: HbA1c 9.4, poorly controlled. Continue on SSI w/ accuchecks    COPD: w/o exacerbation. Continue on bronchodilators     Chronic diastolic LZJ:QBHA on 19/07/7900 showed EF of 60 to 65% with grade 1 diastolic dysfunction. Hold aldactone today secondary to low normal BP. CHF appears compensated    Leukocytosis: likely reactive. Will continue to monitor      Bone lesion and history of lung cancer : s/p hx of  radiation therapy.  MRI  of the brain showed multiple bone lesion in skull and c-spine, concerning for metastasized disease. Biopsy will be tomorrow 07/21/21 as per IR   Carpal tunnel syndrome on right:s/p of carpal tunnel release procedure by Dr. Rudene Christians on 07/15/21.  Thrombocytopenia: etiology unclear. Will continue to monitor   DVT prophylaxis: SCDs Code Status: full Family Communication: discussed pt's care w/ pt's husband at bedside and answered his questions  Disposition Plan: likely d/c to CIR as pt and pt's husband are now agreeable to going to CIR   Level of care: Telemetry Medical  Status is: Inpatient Remains inpatient appropriate because: embolic CVA and will have biopsy on 07/21/21      Consultants:  [place] Neuro   Procedures:   Antimicrobials:    Subjective: Pt c/o back pain   Objective: Vitals:   07/19/21 2222 07/20/21 0000 07/20/21 0420 07/20/21 0500  BP:  (!) 110/57 (!) 114/49   Pulse:  69 65   Resp: 17     Temp:  98.1 F (36.7 C) 98.6 F (37 C)   TempSrc:  Oral Oral   SpO2:  93% 92%   Weight:    61 kg  Height:        Intake/Output Summary (Last 24 hours) at 07/20/2021 0742 Last data filed at 07/19/2021 1900 Gross per 24 hour  Intake 480 ml  Output --  Net 480 ml   Filed Weights   07/17/21 0346 07/19/21 0500 07/20/21  0500  Weight: 62.1 kg 61.4 kg 61 kg    Examination:  General exam: appears calm & comfortable  Respiratory system: clear breath sounds b/l. No wheezes  Cardiovascular system: S1 & S2+. No rubs or clicks  Gastrointestinal system: Abd is soft, NT, ND & hypoactive bowel sounds  Central nervous system: alert and awake. Moves all extremities  Psychiatry: judgement and insight appear at baseline. Flat mood and affect     Data Reviewed: I have personally reviewed following labs and imaging studies  CBC: Recent Labs  Lab 07/17/21 0352 07/19/21 0648 07/20/21 0612  WBC 11.9* 11.0* 11.9*  HGB 9.5* 9.0* 9.3*  HCT 29.1* 28.4* 29.0*  MCV 86.9 87.9 87.1   PLT 131* 112* 98*   Basic Metabolic Panel: Recent Labs  Lab 07/17/21 0352 07/19/21 0648 07/20/21 0612  NA 135 135 134*  K 4.7 4.2 4.2  CL 103 104 104  CO2 21* 18* 21*  GLUCOSE 241* 155* 194*  BUN 37* 34* 42*  CREATININE 1.56* 1.43* 1.58*  CALCIUM 8.5* 8.3* 8.2*   GFR: Estimated Creatinine Clearance: 24.1 mL/min (A) (by C-G formula based on SCr of 1.58 mg/dL (H)). Liver Function Tests: Recent Labs  Lab 07/19/21 0648 07/20/21 0612  AST 52* 47*  ALT 23 23  ALKPHOS 204* 189*  BILITOT 0.7 0.7  PROT 5.5* 5.1*  ALBUMIN 2.6* 2.5*   No results for input(s): LIPASE, AMYLASE in the last 168 hours. No results for input(s): AMMONIA in the last 168 hours. Coagulation Profile: Recent Labs  Lab 07/17/21 0352  INR 1.3*   Cardiac Enzymes: No results for input(s): CKTOTAL, CKMB, CKMBINDEX, TROPONINI in the last 168 hours. BNP (last 3 results) No results for input(s): PROBNP in the last 8760 hours. HbA1C: No results for input(s): HGBA1C in the last 72 hours.  CBG: Recent Labs  Lab 07/18/21 2113 07/19/21 0810 07/19/21 1132 07/19/21 1651 07/19/21 2138  GLUCAP 193* 164* 247* 210* 148*   Lipid Profile: No results for input(s): CHOL, HDL, LDLCALC, TRIG, CHOLHDL, LDLDIRECT in the last 72 hours.  Thyroid Function Tests: Recent Labs    07/18/21 0431  TSH 4.974*   Anemia Panel: Recent Labs    07/18/21 0431  VITAMINB12 834  FOLATE 10.0  FERRITIN 242  TIBC 297  IRON 23*  RETICCTPCT 2.5   Sepsis Labs: No results for input(s): PROCALCITON, LATICACIDVEN in the last 168 hours.  Recent Results (from the past 240 hour(s))  Resp Panel by RT-PCR (Flu A&B, Covid) Nasopharyngeal Swab     Status: None   Collection Time: 07/17/21  6:57 AM   Specimen: Nasopharyngeal Swab; Nasopharyngeal(NP) swabs in vial transport medium  Result Value Ref Range Status   SARS Coronavirus 2 by RT PCR NEGATIVE NEGATIVE Final    Comment: (NOTE) SARS-CoV-2 target nucleic acids are NOT  DETECTED.  The SARS-CoV-2 RNA is generally detectable in upper respiratory specimens during the acute phase of infection. The lowest concentration of SARS-CoV-2 viral copies this assay can detect is 138 copies/mL. A negative result does not preclude SARS-Cov-2 infection and should not be used as the sole basis for treatment or other patient management decisions. A negative result may occur with  improper specimen collection/handling, submission of specimen other than nasopharyngeal swab, presence of viral mutation(s) within the areas targeted by this assay, and inadequate number of viral copies(<138 copies/mL). A negative result must be combined with clinical observations, patient history, and epidemiological information. The expected result is Negative.  Fact Sheet for Patients:  EntrepreneurPulse.com.au  Fact Sheet for Healthcare Providers:  IncredibleEmployment.be  This test is no t yet approved or cleared by the [place] FDA and  has been authorized for detection and/or diagnosis of SARS-CoV-2 by FDA under an Emergency Use Authorization (EUA). This EUA will remain  in effect (meaning this test can be used) for the duration of the COVID-19 declaration under Section 564(b)(1) of the Act, 21 U.S.C.section 360bbb-3(b)(1), unless the authorization is terminated  or revoked sooner.       Influenza A by PCR NEGATIVE NEGATIVE Final   Influenza B by PCR NEGATIVE NEGATIVE Final    Comment: (NOTE) The Xpert Xpress SARS-CoV-2/FLU/RSV plus assay is intended as an aid in the diagnosis of influenza from Nasopharyngeal swab specimens and should not be used as a sole basis for treatment. Nasal washings and aspirates are unacceptable for Xpert Xpress SARS-CoV-2/FLU/RSV testing.  Fact Sheet for Patients: EntrepreneurPulse.com.au  Fact Sheet for Healthcare Providers: IncredibleEmployment.be  This test is not yet  approved or cleared by the [place] FDA and has been authorized for detection and/or diagnosis of SARS-CoV-2 by FDA under an Emergency Use Authorization (EUA). This EUA will remain in effect (meaning this test can be used) for the duration of the COVID-19 declaration under Section 564(b)(1) of the Act, 21 U.S.C. section 360bbb-3(b)(1), unless the authorization is terminated or revoked.  Performed at [facility], [address], [place], [place] [address]          Radiology Studies: No results found.      Scheduled Meds:   stroke: mapping our early stages of recovery book   Does not apply Once   amiodarone  400 mg Oral Daily   calcium-vitamin D  1 tablet Oral Daily   chlorhexidine  5 mL Mouth/Throat BID   ferrous gluconate  324 mg Oral q AM   gabapentin  300 mg Oral QHS   insulin aspart  0-5 Units Subcutaneous QHS   insulin aspart  0-9 Units Subcutaneous TID WC   metoprolol succinate  25 mg Oral Daily   pantoprazole  40 mg Oral Daily   pramipexole  1 mg Oral QHS   rosuvastatin  20 mg Oral QHS   sertraline  50 mg Oral QHS   Continuous Infusions:   LOS: 2 days    Time spent: 15 mins    Wyvonnia Dusky, MD [facility] Pager [phone]  If 7PM-7AM, please contact night-coverage 07/20/2021, 7:42 AM

## 2021-07-20 NOTE — Progress Notes (Addendum)
Physical Therapy Treatment Patient Details Name: Natasha [name] MRN: [id number] DOB: 02-Oct-1940 Today's Date: 07/20/2021   History of Present Illness Natasha [name] is a 81 y.o. female with history of atrial fibrillation on Eliquis, CHF, hypertension, hyperlipidemia, diabetes, COPD, chronic kidney disease who presents to the emergency department with complaints of vertigo.  States she went to sleep yesterday around 4 PM and felt fine.  She woke up around midnight to go to the bathroom and felt like the room was spinning.  Symptoms worse with movement and standing. PMH includes: lung Ca, depression    PT Comments    Pt awake and agrees to session.  To EOB with mod/max a x 1.  Pt is able to sit EOB x 15+ minutes and requires hands on min assist and occasionally mod a to remain upright.  She does put in max effort and actively tries to correct posture on her own.  She is alert and talkative today and orientated.  Further mobility and ex deferred as extended sitting seemed more beneficial to her than more activity and getting tired too quickly.  She is assisted back to supine with max assist to reposition.  Overall tolerated activity well but limited by back pain and general fatigue in sitting.     Of note, prior PT noted referenced DVT and r/o testing.  Checked with MD and cleared for therapy.  Recommendations for follow up therapy are one component of a multi-disciplinary discharge planning process, led by the attending physician.  Recommendations may be updated based on patient status, additional functional criteria and insurance authorization.  Follow Up Recommendations  Acute inpatient rehab (3hours/day)     Assistance Recommended at Discharge Frequent or constant Supervision/Assistance  Patient can return home with the following Help with stairs or ramp for entrance;Assist for transportation;Assistance with cooking/housework;A lot of help with walking and/or transfers;Two people to help with  walking and/or transfers;Direct supervision/assist for medications management;Direct supervision/assist for financial management;A lot of help with bathing/dressing/bathroom   Equipment Recommendations       Recommendations for Other Services       Precautions / Restrictions Precautions Precautions: Fall Precaution Comments: recent carpal tunnel sx R UE Restrictions Weight Bearing Restrictions: No     Mobility  Bed Mobility Overal bed mobility: Needs Assistance Bed Mobility: Supine to Sit, Sit to Supine     Supine to sit: Mod assist, Max assist Sit to supine: Max assist        Transfers                   General transfer comment: deferred    Ambulation/Gait                   Stairs             Wheelchair Mobility    Modified Rankin (Stroke Patients Only)       Balance Overall balance assessment: Needs assistance Sitting-balance support: Feet supported Sitting balance-Leahy Scale: Poor Sitting balance - Comments: consistant min assist to remain upright mod at at times       Standing balance comment: high fall risk                            Cognition Arousal/Alertness: Awake/alert Behavior During Therapy: WFL for tasks assessed/performed Overall Cognitive Status: Within Functional Limits for tasks assessed  Exercises Other Exercises Other Exercises: sat EOB 15 minutes with assist.    General Comments        Pertinent Vitals/Pain Pain Assessment Pain Assessment: Faces Faces Pain Scale: Hurts even more Pain Location: general soreness legs and back primarily Pain Descriptors / Indicators: Discomfort, Guarding, Grimacing Pain Intervention(s): Limited activity within patient's tolerance, Monitored during session, Repositioned    Home Living                          Prior Function            PT Goals (current goals can now be found in the  care plan section) Progress towards PT goals: Progressing toward goals    Frequency    7X/week      PT Plan Current plan remains appropriate    Co-evaluation              AM-PAC PT "6 Clicks" Mobility   Outcome Measure  Help needed turning from your back to your side while in a flat bed without using bedrails?: A Lot Help needed moving from lying on your back to sitting on the side of a flat bed without using bedrails?: A Lot Help needed moving to and from a bed to a chair (including a wheelchair)?: Total Help needed standing up from a chair using your arms (e.g., wheelchair or bedside chair)?: Total Help needed to walk in hospital room?: Total Help needed climbing 3-5 steps with a railing? : Total 6 Click Score: 8    End of Session   Activity Tolerance: Patient tolerated treatment well;Patient limited by pain Patient left: in bed;with call bell/phone within reach;with bed alarm set Nurse Communication: Mobility status PT Visit Diagnosis: Unsteadiness on feet (R26.81);Other abnormalities of gait and mobility (R26.89);Difficulty in walking, not elsewhere classified (R26.2)     Time: 1017-5102 PT Time Calculation (min) (ACUTE ONLY): 23 min  Charges:  $Therapeutic Activity: 23-37 mins                    Chesley Noon, PTA 07/20/21, 9:49 AM

## 2021-07-21 ENCOUNTER — Inpatient Hospital Stay: Payer: Medicare Other

## 2021-07-21 DIAGNOSIS — M899 Disorder of bone, unspecified: Secondary | ICD-10-CM | POA: Diagnosis not present

## 2021-07-21 DIAGNOSIS — I639 Cerebral infarction, unspecified: Secondary | ICD-10-CM | POA: Diagnosis not present

## 2021-07-21 DIAGNOSIS — I480 Paroxysmal atrial fibrillation: Secondary | ICD-10-CM | POA: Diagnosis not present

## 2021-07-21 DIAGNOSIS — K769 Liver disease, unspecified: Secondary | ICD-10-CM | POA: Diagnosis not present

## 2021-07-21 DIAGNOSIS — C3412 Malignant neoplasm of upper lobe, left bronchus or lung: Secondary | ICD-10-CM | POA: Diagnosis not present

## 2021-07-21 LAB — PROTIME-INR
INR: 1.4 — ABNORMAL HIGH (ref 0.8–1.2)
Prothrombin Time: 17.5 seconds — ABNORMAL HIGH (ref 11.4–15.2)

## 2021-07-21 LAB — CBC
HCT: 29.9 % — ABNORMAL LOW (ref 36.0–46.0)
Hemoglobin: 9.7 g/dL — ABNORMAL LOW (ref 12.0–15.0)
MCH: 28.6 pg (ref 26.0–34.0)
MCHC: 32.4 g/dL (ref 30.0–36.0)
MCV: 88.2 fL (ref 80.0–100.0)
Platelets: 81 K/uL — ABNORMAL LOW (ref 150–400)
RBC: 3.39 MIL/uL — ABNORMAL LOW (ref 3.87–5.11)
RDW: 16.8 % — ABNORMAL HIGH (ref 11.5–15.5)
WBC: 12.4 K/uL — ABNORMAL HIGH (ref 4.0–10.5)
nRBC: 0 % (ref 0.0–0.2)

## 2021-07-21 LAB — COMPREHENSIVE METABOLIC PANEL WITH GFR
ALT: 26 U/L (ref 0–44)
AST: 63 U/L — ABNORMAL HIGH (ref 15–41)
Albumin: 2.4 g/dL — ABNORMAL LOW (ref 3.5–5.0)
Alkaline Phosphatase: 210 U/L — ABNORMAL HIGH (ref 38–126)
Anion gap: 13 (ref 5–15)
BUN: 51 mg/dL — ABNORMAL HIGH (ref 8–23)
CO2: 20 mmol/L — ABNORMAL LOW (ref 22–32)
Calcium: 8.2 mg/dL — ABNORMAL LOW (ref 8.9–10.3)
Chloride: 105 mmol/L (ref 98–111)
Creatinine, Ser: 1.81 mg/dL — ABNORMAL HIGH (ref 0.44–1.00)
GFR, Estimated: 28 mL/min — ABNORMAL LOW (ref >60)
Glucose, Bld: 194 mg/dL — ABNORMAL HIGH (ref 70–99)
Potassium: 4.3 mmol/L (ref 3.5–5.1)
Sodium: 138 mmol/L (ref 135–145)
Total Bilirubin: 0.7 mg/dL (ref 0.3–1.2)
Total Protein: 5.1 g/dL — ABNORMAL LOW (ref 6.5–8.1)

## 2021-07-21 LAB — KAPPA/LAMBDA LIGHT CHAINS
Kappa free light chain: 31 mg/L — ABNORMAL HIGH (ref 3.3–19.4)
Kappa, lambda light chain ratio: 1.38 (ref 0.26–1.65)
Lambda free light chains: 22.5 mg/L (ref 5.7–26.3)

## 2021-07-21 LAB — GLUCOSE, CAPILLARY
Glucose-Capillary: 192 mg/dL — ABNORMAL HIGH (ref 70–99)
Glucose-Capillary: 195 mg/dL — ABNORMAL HIGH (ref 70–99)
Glucose-Capillary: 216 mg/dL — ABNORMAL HIGH (ref 70–99)

## 2021-07-21 LAB — MULTIPLE MYELOMA PANEL, SERUM
Albumin SerPl Elph-Mcnc: 2.6 g/dL — ABNORMAL LOW (ref 2.9–4.4)
Albumin/Glob SerPl: 1.1 (ref 0.7–1.7)
Alpha 1: 0.3 g/dL (ref 0.0–0.4)
Alpha2 Glob SerPl Elph-Mcnc: 0.8 g/dL (ref 0.4–1.0)
B-Globulin SerPl Elph-Mcnc: 0.9 g/dL (ref 0.7–1.3)
Gamma Glob SerPl Elph-Mcnc: 0.4 g/dL (ref 0.4–1.8)
Globulin, Total: 2.4 g/dL (ref 2.2–3.9)
IgA: 128 mg/dL (ref 64–422)
IgG (Immunoglobin G), Serum: 511 mg/dL — ABNORMAL LOW (ref 586–1602)
IgM (Immunoglobulin M), Srm: 67 mg/dL (ref 26–217)
Total Protein ELP: 5 g/dL — ABNORMAL LOW (ref 6.0–8.5)

## 2021-07-21 LAB — IMMATURE PLATELET FRACTION: Immature Platelet Fraction: 7.8 % (ref 1.2–8.6)

## 2021-07-21 MED ORDER — DOCUSATE SODIUM 100 MG PO CAPS
200.0000 mg | ORAL_CAPSULE | Freq: Two times a day (BID) | ORAL | Status: DC
  Administered 2021-07-21 – 2021-07-22 (×2): 200 mg via ORAL
  Filled 2021-07-21 (×3): qty 2

## 2021-07-21 MED ORDER — FENTANYL CITRATE (PF) 100 MCG/2ML IJ SOLN
INTRAMUSCULAR | Status: AC
  Filled 2021-07-21: qty 2

## 2021-07-21 MED ORDER — MIDAZOLAM HCL 2 MG/2ML IJ SOLN
INTRAMUSCULAR | Status: AC
  Filled 2021-07-21: qty 2

## 2021-07-21 MED ORDER — POLYETHYLENE GLYCOL 3350 17 G PO PACK
17.0000 g | PACK | Freq: Every day | ORAL | Status: DC
  Administered 2021-07-22 – 2021-07-28 (×6): 17 g via ORAL
  Filled 2021-07-21 (×8): qty 1

## 2021-07-21 NOTE — Progress Notes (Signed)
OT Cancellation Note  Patient Details Name: [name] MRN: [id number] DOB: 27-Mar-1941   Cancelled Treatment:    Reason Eval/Treat Not Completed: Patient at procedure or test/ unavailable. Pt off floor at present, undergoing a liver biopsy. Will attempt therapy at a later time/date, as pt is available and medically appropriate.   Josiah Lobo, PhD, MS, OTR/L 07/21/21, 5:34 PM

## 2021-07-21 NOTE — Progress Notes (Signed)
Inpatient Rehab Admissions Coordinator:   I spoke with Pt. And husband regarding potential CIR admit. They are not sure that Pt. Will be able to tolerate CIR, as she fatigued doing bed level therapy earlier this morning. Pt. Asked that I call back in 15 minutes to discuss.   Clemens Catholic, [place], [place] Admissions Coordinator  [phone] ([place]) [phone] (office)

## 2021-07-21 NOTE — Progress Notes (Signed)
PT Cancellation Note  Patient Details Name: Natasha [name] MRN: [id number] DOB: 1940/10/29   Cancelled Treatment:    Reason Eval/Treat Not Completed: Patient at procedure or test/unavailable  Pt awaiting transport for liver biopsy.  Will reschedule at a later time/date.   Chesley Noon 07/21/2021, 4:10 PM

## 2021-07-21 NOTE — NC FL2 (Signed)
[place] LEVEL OF CARE SCREENING TOOL     IDENTIFICATION  Patient Name: Natasha [name] Birthdate: 1940/12/11 Sex: female Admission Date (Current Location): 07/17/2021  [facility] [place] Number:  Engineering geologist and Address:  [facility], [address] [place], [place] [address]      Provider Number: [id number]  Attending Physician Name and Address:  Wyvonnia Dusky, MD  Relative Name and Phone Number:       Current Level of Care: Hospital Recommended Level of Care: [place] Prior Approval Number:    Date Approved/Denied:   PASRR Number: [id number]  Discharge Plan: SNF    Current Diagnoses: Patient Active Problem List   Diagnosis Date Noted   CVA (cerebral vascular accident) ([place]) 07/18/2021   Acute ischemic stroke ([place]) 07/17/2021   HLD (hyperlipidemia) 07/17/2021   Type II diabetes mellitus with renal manifestations ([place]) 07/17/2021   COPD (chronic obstructive pulmonary disease) ([place]) 07/17/2021   CKD (chronic kidney disease), stage IV ([place]) 07/17/2021   Chronic diastolic CHF (congestive heart failure) ([place]) 07/17/2021   Leukocytosis 07/17/2021   Bone lesion 48/18/5909   Acute embolic stroke ([place]) 31/04/1623   Lung cancer ([place]) 07/17/2021   Carpal tunnel syndrome on right 07/17/2021   Lesion of liver    Vitamin B12 deficiency 07/14/2021   Localized swelling, mass and lump, neck 07/14/2021   Elevated sed rate 07/14/2021   Vitamin D deficiency 07/14/2021   CKD (chronic kidney disease) stage 4, GFR 15-29 ml/min ([place]) 06/24/2021   Polyarthralgia 06/16/2021   Muscle weakness 06/16/2021   Hyperkalemia 06/16/2021   Unsteadiness on feet 06/16/2021   Acute leg pain, right 06/16/2021   Double vision 03/13/2021   Dizziness 03/13/2021   COVID-19 virus infection 02/24/2021   Atrial flutter ([place]) 02/24/2021   Chronic bilateral low back pain with bilateral sciatica 02/17/2021   Decreased sensation  11/27/2020   Dermatitis 11/27/2020   Axillary lymphadenopathy 10/11/2020   Cancer of upper lobe of left lung ([place]) 10/11/2020   Goals of care, counseling/discussion 10/11/2020   Status post reverse total shoulder replacement, 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   Diverticulitis of colon 02/03/2020   Chronic pain syndrome 01/12/2019   Neuropathic pain 01/12/2019   At high risk for falls 11/02/2018   Neuroforaminal stenosis of lumbar spine 11/02/2018   Sacroiliitis ([place]) 11/02/2018   Former smoker 09/23/2018   Spinal stenosis of lumbar region without neurogenic claudication 09/23/2018   Weakness of both hands 09/23/2018   Overweight (BMI 25.0-29.9) 08/10/2018   Insomnia 06/17/2018   Chronic kidney disease with symptom management only, stage 3 (moderate) ([place]) 05/27/2018   Nonrheumatic aortic valve stenosis 05/24/2018   Iron deficiency anemia 12/24/2017   Paroxysmal atrial fibrillation ([place]) 12/24/2017   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/24/2017   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/2017   Gastroesophageal reflux disease 11/17/2016   OSA (obstructive sleep apnea) 07/24/2016   Diverticulosis 07/21/2016   Osteopenia of multiple sites 06/04/2016   Closed compression fracture of thoracic vertebra ([place]) 04/02/2015   Mixed hyperlipidemia 04/02/2015   Restless leg syndrome 04/02/2015   Closed fracture of lateral portion of left tibial plateau 04/06/2014   Essential hypertension 04/06/2014   Mild intermittent asthma without complication 46/95/0722   Type 2 diabetes mellitus with other specified complication ([place]) 57/50/5183    Orientation RESPIRATION BLADDER Height & Weight     Self, Time, Situation, Place  Normal Incontinent Weight: 134 lb 7.7 oz (61 kg) Height:  $Remove'5\' 4"'ITdjBfe$  (162.6 cm)  BEHAVIORAL  SYMPTOMS/MOOD NEUROLOGICAL BOWEL NUTRITION STATUS      Continent Diet (diet subject to change please see dc symmary)  AMBULATORY STATUS COMMUNICATION OF NEEDS Skin   Extensive Assist Verbally Other (Comment)  (closed incison right arm, compression wrapped)                       Personal Care Assistance Level of Assistance  Dressing, Feeding, Bathing Bathing Assistance: Maximum assistance Feeding assistance: Independent Dressing Assistance: Maximum assistance     Functional Limitations Info  Sight, Hearing, Speech Sight Info: Adequate Hearing Info: Adequate Speech Info: Adequate    SPECIAL CARE FACTORS FREQUENCY  PT (By licensed PT), OT (By licensed OT)     PT Frequency: 5x OT Frequency: 5x            Contractures Contractures Info: Not present    Additional Factors Info  Code Status, Allergies Code Status Info: full code Allergies Info: Atorvastatin, Lantus (Insulin Glargine), Lyrica (Pregabalin), Lisinopril           Current Medications (07/21/2021):  This is the current hospital active medication list Current Facility-Administered Medications  Medication Dose Route Frequency Provider Last Rate Last Admin    stroke: mapping our early stages of recovery book   Does not apply Once Howerter, Justin B, DO       acetaminophen (TYLENOL) tablet 650 mg  650 mg Oral Q6H PRN Ivor Costa, MD   650 mg at 07/20/21 1648   amiodarone (PACERONE) tablet 400 mg  400 mg Oral Daily Ivor Costa, MD   400 mg at 07/20/21 0920   calcium-vitamin D (OSCAL WITH D) 500-5 MG-MCG per tablet 1 tablet  1 tablet Oral Daily Ivor Costa, MD   1 tablet at 07/20/21 1610   chlorhexidine (PERIDEX) 0.12 % solution 5 mL  5 mL Mouth/Throat BID Ivor Costa, MD       dextromethorphan-guaiFENesin ([place] DM) 30-600 MG per 12 hr tablet 1 tablet  1 tablet Oral BID PRN Ivor Costa, MD       docusate sodium (COLACE) capsule 200 mg  200 mg Oral BID Wyvonnia Dusky, MD       ferrous gluconate [facility]) tablet 324 mg  324 mg Oral q AM Ivor Costa, MD   324 mg at 07/21/21 0617   gabapentin (NEURONTIN) capsule 300 mg  300 mg Oral QHS Ivor Costa, MD   300 mg at 07/20/21 2029   hydrALAZINE (APRESOLINE) injection 5 mg  5 mg  Intravenous Q2H PRN Ivor Costa, MD       HYDROcodone-acetaminophen (NORCO) 7.5-325 MG per tablet 1 tablet  1 tablet Oral Q6H PRN Ivor Costa, MD   1 tablet at 07/21/21 0617   insulin aspart (novoLOG) injection 0-5 Units  0-5 Units Subcutaneous QHS Ivor Costa, MD       insulin aspart (novoLOG) injection 0-9 Units  0-9 Units Subcutaneous TID WC Ivor Costa, MD   1 Units at 07/21/21 0933   levalbuterol (XOPENEX) nebulizer solution 1.25 mg  1.25 mg Inhalation Q8H PRN Ivor Costa, MD       metoprolol succinate (TOPROL-XL) 24 hr tablet 25 mg  25 mg Oral Daily Ivor Costa, MD   25 mg at 07/20/21 0920   ondansetron (ZOFRAN) injection 4 mg  4 mg Intravenous Q8H PRN Ivor Costa, MD       oxyCODONE (Oxy IR/ROXICODONE) immediate release tablet 5 mg  5 mg Oral Q6H PRN Howerter, Justin B, DO   5 mg at 07/19/21  2359   pantoprazole (PROTONIX) EC tablet 40 mg  40 mg Oral Daily Ivor Costa, MD   40 mg at 07/20/21 0920   polyethylene glycol (MIRALAX / GLYCOLAX) packet 17 g  17 g Oral Daily Wyvonnia Dusky, MD       pramipexole (MIRAPEX) tablet 1 mg  1 mg Oral QHS Ivor Costa, MD   1 mg at 07/20/21 2029   rosuvastatin (CRESTOR) tablet 20 mg  20 mg Oral QHS Ivor Costa, MD   20 mg at 07/20/21 2028   senna-docusate (Senokot-S) tablet 1 tablet  1 tablet Oral QHS PRN Ivor Costa, MD       sertraline (ZOLOFT) tablet 50 mg  50 mg Oral QHS Ivor Costa, MD   50 mg at 07/20/21 2029     Discharge Medications: Please see discharge summary for a list of discharge medications.  Relevant Imaging Results:  Relevant Lab Results:   Additional Information [id number]  Eileen Stanford, LCSW

## 2021-07-21 NOTE — Progress Notes (Signed)
Patient clinically stable post Left LN neck biopsy/ tolerated well with only local anesthetic used. Awake/alert and oriented post procedure. Vitals stable. Report given to care nurse/123.

## 2021-07-21 NOTE — Procedures (Signed)
Interventional Radiology Procedure:   Indications: History of lung cancer and concern for disease progression.  Needs tissue diagnosis.  New liver lesions on CT.  Procedure: US guided left cervical lymph node biopsy  Findings: Planned for US guided liver lesion biopsy.  Small liver lesions are NOT visible on [place].  Patient noted a lump on left side of neck and US demonstrated an abnormal lymph node.  US guided core biopsy of the left neck lymph node was performed.  4 core specimens obtained.   Complications: No immediate complications noted.     EBL: Minimal  Plan: Return to inpatient room.    Justo Hengel R. Anselm Pancoast, MD  Pager: [phone]

## 2021-07-21 NOTE — Progress Notes (Addendum)
Hematology/Oncology Progress note [phone] [phone]     Patient Care Team: Lesleigh Noe, MD as PCP - General (Family Medicine) Kate Sable, MD as PCP - Cardiology (Cardiology) Telford Nab, RN as Oncology Nurse Navigator   Name of the patient: Natasha [name]  [id number]  1941-02-21  Date of visit: 07/21/21   INTERVAL HISTORY-  Patient was scheduled for ultrasound-guided liver biopsy.  Small liver lesions were not visible on ultrasound. Status post left cervical lymph node biopsy. Patient reports some soreness after biopsy.  Yolanda Bonine is at the bedside. Continues to feel weak, Allergies  Allergen Reactions   Atorvastatin     Muscle/joint aches   Lantus [Insulin Glargine] Hives   Lyrica [Pregabalin] Other (See Comments)    Headache, disorientation   Lisinopril Hives and Cough         Patient Active Problem List   Diagnosis Date Noted   CVA (cerebral vascular accident) ([place]) 07/18/2021   Acute ischemic stroke ([place]) 07/17/2021   HLD (hyperlipidemia) 07/17/2021   Type II diabetes mellitus with renal manifestations ([place]) 07/17/2021   COPD (chronic obstructive pulmonary disease) ([place]) 07/17/2021   CKD (chronic kidney disease), stage IV (HCC) 07/17/2021   Chronic diastolic CHF (congestive heart failure) ([place]) 07/17/2021   Leukocytosis 07/17/2021   Bone lesion 87/86/7672   Acute embolic stroke ([place]) 09/47/0962   Lung cancer ([place]) 07/17/2021   Carpal tunnel syndrome on right 07/17/2021   Lesion of liver    Vitamin B12 deficiency 07/14/2021   Localized swelling, mass and lump, neck 07/14/2021   Elevated sed rate 07/14/2021   Vitamin D deficiency 07/14/2021   CKD (chronic kidney disease) stage 4, GFR 15-29 ml/min (HCC) 06/24/2021   Polyarthralgia 06/16/2021   Muscle weakness 06/16/2021   Hyperkalemia 06/16/2021   Unsteadiness on feet 06/16/2021   Acute leg pain, right 06/16/2021   Double vision 03/13/2021   Dizziness 03/13/2021    COVID-19 virus infection 02/24/2021   Atrial flutter ([place]) 02/24/2021   Chronic bilateral low back pain with bilateral sciatica 02/17/2021   Decreased sensation 11/27/2020   Dermatitis 11/27/2020   Axillary lymphadenopathy 10/11/2020   Cancer of upper lobe of left lung ([place]) 10/11/2020   Goals of care, counseling/discussion 10/11/2020   Status post reverse total shoulder replacement, 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   Diverticulitis of colon 02/03/2020   Chronic pain syndrome 01/12/2019   Neuropathic pain 01/12/2019   At high risk for falls 11/02/2018   Neuroforaminal stenosis of lumbar spine 11/02/2018   Sacroiliitis ([place]) 11/02/2018   Former smoker 09/23/2018   Spinal stenosis of lumbar region without neurogenic claudication 09/23/2018   Weakness of both hands 09/23/2018   Overweight (BMI 25.0-29.9) 08/10/2018   Insomnia 06/17/2018   Chronic kidney disease with symptom management only, stage 3 (moderate) ([place]) 05/27/2018   Nonrheumatic aortic valve stenosis 05/24/2018   Iron deficiency anemia 12/24/2017   Paroxysmal atrial fibrillation ([place]) 12/24/2017   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/24/2017   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/2017   Gastroesophageal reflux disease 11/17/2016   OSA (obstructive sleep apnea) 07/24/2016   Diverticulosis 07/21/2016   Osteopenia of multiple sites 06/04/2016   Closed compression fracture of thoracic vertebra ([place]) 04/02/2015   Mixed hyperlipidemia 04/02/2015   Restless leg syndrome 04/02/2015   Closed fracture of lateral portion of left tibial plateau 04/06/2014   Essential hypertension 04/06/2014   Mild intermittent asthma without complication 83/66/2947   Type 2 diabetes mellitus with other specified complication ([place]) 65/46/5035     Past Medical  History:  Diagnosis Date   A-fib [facility])    a.) CHA2DS2-VASc Score = 6 (age x 2, sex, HTN, aortic plaque, T2DM). 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   Chronic cough    CKD (chronic kidney disease), stage III (HCC)    Complication of anesthesia    COPD (chronic obstructive pulmonary disease) (HCC)    Depression    Diastolic dysfunction    a.) TTE 04/08/2014: EF 60%; mild concentric LVH; G2DD. b.) TTE 12/21/2019: EF 60-65%, LA mildly dilated, mild-mod MR; PASP 36.8; G1DD. c.) TTE 02/25/2021: EF 60-65%; normal LV function with mild concentric LVH; G1DD   Diverticulitis    Dyspnea    High cholesterol    History of 2019 novel coronavirus disease (COVID-19) 02/24/2021   History of hiatal hernia    History of kidney stones    Hypertension    Insomnia    Long term current use of anticoagulant    a.) apixaban   Lumbar spinal stenosis    Mild asthma    Murmur    Non-small cell carcinoma of left lung, stage 1 ([place]) 09/30/2020   a.) clinical stage 1 (cT1cN0cM0). b.) treated with SBRT (60 cGy over 5 fractions).   OSA on CPAP    Osteoporosis    Restless leg    Sepsis ([place])    T2DM (type 2 diabetes mellitus) ([place])      Past Surgical History:  Procedure Laterality Date   ABDOMINAL HYSTERECTOMY  1978   ANTERIOR INTEROSSEOUS NERVE DECOMPRESSION Right 07/15/2021   Procedure: Cubital tunnel release;  Surgeon: Hessie Knows, MD;  Location: [facility] ORS;  Service: Orthopedics;  Laterality: Right;   APPENDECTOMY  1978   BREAST BIOPSY Left ?   papilloma   BREAST CYST EXCISION Bilateral yrs ago   benign, scars not well visualized   BREAST SURGERY     CARPAL TUNNEL RELEASE Left 04/22/2021   Procedure: Left carpal tunnel release & ulnar nerve release at elbow;  Surgeon: Hessie Knows, MD;  Location: [facility] ORS;  Service: Orthopedics;  Laterality: Left;    CARPAL TUNNEL RELEASE Right 07/15/2021   Procedure: CARPAL TUNNEL RELEASE;  Surgeon: Hessie Knows, MD;  Location: [facility] ORS;  Service: Orthopedics;  Laterality: Right;   CATARACT EXTRACTION W/ INTRAOCULAR LENS  IMPLANT, BILATERAL Bilateral    CHOLECYSTECTOMY     COLONOSCOPY     ELBOW SURGERY Right    [place] release   EYE SURGERY     FRACTURE SURGERY     GASTRIC BYPASS  12/22/2017   [place]   JOINT REPLACEMENT     KNEE SURGERY Left    tibial fracture with metal plate   TOTAL SHOULDER REPLACEMENT Left 2014   ULNAR TUNNEL RELEASE Left 04/22/2021   Procedure: CUBITAL TUNNEL RELEASE;  Surgeon: Hessie Knows, MD;  Location: [facility] ORS;  Service: Orthopedics;  Laterality: Left;   VIDEO BRONCHOSCOPY WITH ENDOBRONCHIAL NAVIGATION N/A 09/30/2020   Procedure: ROBOTIC ASSISTED VIDEO BRONCHOSCOPY WITH ENDOBRONCHIAL NAVIGATION;  Surgeon: Tyler Pita, MD;  Location: [facility] ORS;  Service: Pulmonary;  Laterality: N/A;   WRIST SURGERY Left    fractures    Social History   Socioeconomic  History   Marital status: Married    Spouse name: Scientist, physiological   Number of children: 1   Years of education: some college   Highest education level: Not on file  Occupational History   Not on file  Tobacco Use   Smoking status: Former    Packs/day: 1.00    Years: 12.00    Pack years: 12.00    Types: Cigarettes    Quit date: 05/26/1975    Years since quitting: 46.1    Passive exposure: Past   Smokeless tobacco: Never  Vaping Use   Vaping Use: Never used  Substance and Sexual Activity   Alcohol use: No    Comment: rarely   Drug use: Never   Sexual activity: Not Currently  Other Topics Concern   Not on file  Social History Narrative   07/22/20   [place]   Living: with husband, Scientist, physiological [phone])   Work: retired - high end Journalist, newspaper      Family: grandson - Ovid Curd 07-13-1998) (son is deceased) - and living with them      Enjoys: Enjoys Social worker, going to art  shows, gardening and painting      Exercise: not currently   Diet: does not follow diabetic diet      Safety   Seat belts: Yes    Guns: Yes  and secure   Safe in relationships: Yes    Social Determinants of Health   Financial Resource Strain: Not on file  Food Insecurity: Not on file  Transportation Needs: Not on file  Physical Activity: Not on file  Stress: Not on file  Social Connections: Not on file  Intimate Partner Violence: Not on file     Family History  Problem Relation Age of Onset   Other Mother        died from surgery   AAA (abdominal aortic aneurysm) Mother    Diabetes Father        controlled by diet   Dementia Father        brain atrophy - unknown origin   Breast cancer Cousin        maternal     Current Facility-Administered Medications:     stroke: mapping our early stages of recovery book, , Does not apply, Once, Howerter, Justin B, DO   acetaminophen (TYLENOL) tablet 650 mg, 650 mg, Oral, Q6H PRN, Ivor Costa, MD, 650 mg at 07/20/21 1648   amiodarone (PACERONE) tablet 400 mg, 400 mg, Oral, Daily, Ivor Costa, MD, 400 mg at 07/20/21 0920   calcium-vitamin D (OSCAL WITH D) 500-5 MG-MCG per tablet 1 tablet, 1 tablet, Oral, Daily, Ivor Costa, MD, 1 tablet at 07/20/21 9675   chlorhexidine (PERIDEX) 0.12 % solution 5 mL, 5 mL, Mouth/Throat, BID, Ivor Costa, MD   dextromethorphan-guaiFENesin (Person DM) 30-600 MG per 12 hr tablet 1 tablet, 1 tablet, Oral, BID PRN, Ivor Costa, MD   docusate sodium (COLACE) capsule 200 mg, 200 mg, Oral, BID, Jimmye Norman, Jamiese M, MD   ferrous gluconate (FERGON) tablet 324 mg, 324 mg, Oral, q AM, Ivor Costa, MD, 324 mg at 07/21/21 0617   gabapentin (NEURONTIN) capsule 300 mg, 300 mg, Oral, QHS, Niu, Soledad Gerlach, MD, 300 mg at 07/20/21 2027/07/14   hydrALAZINE (APRESOLINE) injection 5 mg, 5 mg, Intravenous, Q2H PRN, Ivor Costa, MD   HYDROcodone-acetaminophen (NORCO) 7.5-325 MG per tablet 1 tablet, 1 tablet, Oral, Q6H PRN, Ivor Costa, MD, 1  tablet at  07/21/21 0617   insulin aspart (novoLOG) injection 0-5 Units, 0-5 Units, Subcutaneous, QHS, Ivor Costa, MD   insulin aspart (novoLOG) injection 0-9 Units, 0-9 Units, Subcutaneous, TID WC, Ivor Costa, MD, 2 Units at 07/21/21 1224   levalbuterol (XOPENEX) nebulizer solution 1.25 mg, 1.25 mg, Inhalation, Q8H PRN, Ivor Costa, MD   metoprolol succinate (TOPROL-XL) 24 hr tablet 25 mg, 25 mg, Oral, Daily, Ivor Costa, MD, 25 mg at 07/20/21 0920   ondansetron (ZOFRAN) injection 4 mg, 4 mg, Intravenous, Q8H PRN, Ivor Costa, MD   oxyCODONE (Oxy IR/ROXICODONE) immediate release tablet 5 mg, 5 mg, Oral, Q6H PRN, Howerter, Justin B, DO, 5 mg at 07/21/21 1702   pantoprazole (PROTONIX) EC tablet 40 mg, 40 mg, Oral, Daily, Ivor Costa, MD, 40 mg at 07/20/21 0920   polyethylene glycol (MIRALAX / GLYCOLAX) packet 17 g, 17 g, Oral, Daily, Wyvonnia Dusky, MD   pramipexole (MIRAPEX) tablet 1 mg, 1 mg, Oral, QHS, Ivor Costa, MD, 1 mg at 07/20/21 2029   rosuvastatin (CRESTOR) tablet 20 mg, 20 mg, Oral, QHS, Ivor Costa, MD, 20 mg at 07/20/21 2028   senna-docusate (Senokot-S) tablet 1 tablet, 1 tablet, Oral, QHS PRN, Ivor Costa, MD   sertraline (ZOLOFT) tablet 50 mg, 50 mg, Oral, Gordan Payment, MD, 50 mg at 07/20/21 2029   Physical exam:  Vitals:   07/21/21 1257 07/21/21 1316 07/21/21 1321 07/21/21 1624  BP: (!) 105/52 (!) 127/48 (!) 122/46 (!) 117/50  Pulse: 72 74 72 71  Resp: _0 Temp:    98.3 F (36.8 C)  TempSrc:    Oral  SpO2: 98% 99% 100% 94%  Weight:      Height:       Physical Exam     CMP Latest Ref Rng & Units 07/21/2021  Glucose 70 - 99 mg/dL 194(H)  BUN 8 - 23 mg/dL 51(H)  Creatinine 0.44 - 1.00 mg/dL 1.81(H)  Sodium 135 - 145 mmol/L 138  Potassium 3.5 - 5.1 mmol/L 4.3  Chloride 98 - 111 mmol/L 105  CO2 22 - 32 mmol/L 20(L)  Calcium 8.9 - 10.3 mg/dL 8.2(L)  Total Protein 6.5 - 8.1 g/dL 5.1(L)  Total Bilirubin 0.3 - 1.2 mg/dL 0.7  Alkaline Phos 38 - 126 U/L 210(H)   AST 15 - 41 U/L 63(H)  ALT 0 - 44 U/L 26   CBC Latest Ref Rng & Units 07/21/2021  WBC 4.0 - 10.5 K/uL 12.4(H)  Hemoglobin 12.0 - 15.0 g/dL 9.7(L)  Hematocrit 36.0 - 46.0 % 29.9(L)  Platelets 150 - 400 K/uL 81(L)    RADIOGRAPHIC STUDIES: I have personally reviewed the radiological images as listed and agreed with the findings in the report. CT Head Wo Contrast  Result Date: 07/17/2021 CLINICAL DATA:  Nonspecific dizziness EXAM: CT HEAD WITHOUT CONTRAST TECHNIQUE: Contiguous axial images were obtained from the base of the skull through the vertex without intravenous contrast. RADIATION DOSE REDUCTION: This exam was performed according to the departmental dose-optimization program which includes automated exposure control, adjustment of the mA and/or kV according to patient size and/or use of iterative reconstruction technique. COMPARISON:  03/09/2021 FINDINGS: Brain: 2 small right cerebellar infarcts not seen on prior, possibly recent and symptomatic. Few remote supratentorial white matter insults. No hemorrhage, hydrocephalus, or collection. Age normal brain volume. Vascular: No hyperdense vessel or unexpected calcification. Skull: Normal. Negative for fracture or focal lesion. Sinuses/Orbits: No acute finding. IMPRESSION: Two small right cerebellar infarcts since brain MRI October 2022, possibly recent  based on the history. Electronically Signed   By: Jorje Guild M.D.   On: 07/17/2021 04:32   MR ANGIO HEAD WO CONTRAST  Result Date: 07/17/2021 CLINICAL DATA:  Dizziness, infarcts on MRI EXAM: MRA NECK WITHOUT CONTRAST MRA HEAD WITHOUT CONTRAST TECHNIQUE: Angiographic images of the Circle of Willis were acquired using MRA technique without intravenous contrast. COMPARISON:  None. FINDINGS: MRA NECK FINDINGS Standard aortic branching. Common, internal, and external carotid arteries are patent, without hemodynamically significant stenosis. Extracranial vertebral arteries are patent, without  hemodynamically significant stenosis, although imaging of the origins is somewhat limited by artifact. MRA HEAD FINDINGS Both internal carotid arteries are patent to the termini, without significant stenosis. A1 segments patent. Normal anterior communicating artery. Anterior cerebral arteries are patent to their distal aspects. No M1 stenosis or occlusion. Normal MCA bifurcations. Distal MCA branches perfused and symmetric. Vertebral arteries patent to the vertebrobasilar junction without stenosis. Basilar patent to its distal aspect. Superior cerebellar arteries patent bilaterally. Patent P1 segments, diminutive on the right. Near fetal origin of the right PCA with patent right posterior communicating artery. PCAs perfused to their distal aspects without stenosis. Possible diminutive left posterior communicating artery. IMPRESSION: 1.  No intracranial large vessel occlusion or significant stenosis. 2.  No hemodynamically significant stenosis in the neck. Electronically Signed   By: Merilyn Baba M.D.   On: 07/17/2021 23:08   MR ANGIO NECK WO CONTRAST  Result Date: 07/17/2021 CLINICAL DATA:  Dizziness, infarcts on MRI EXAM: MRA NECK WITHOUT CONTRAST MRA HEAD WITHOUT CONTRAST TECHNIQUE: Angiographic images of the Circle of Willis were acquired using MRA technique without intravenous contrast. COMPARISON:  None. FINDINGS: MRA NECK FINDINGS Standard aortic branching. Common, internal, and external carotid arteries are patent, without hemodynamically significant stenosis. Extracranial vertebral arteries are patent, without hemodynamically significant stenosis, although imaging of the origins is somewhat limited by artifact. MRA HEAD FINDINGS Both internal carotid arteries are patent to the termini, without significant stenosis. A1 segments patent. Normal anterior communicating artery. Anterior cerebral arteries are patent to their distal aspects. No M1 stenosis or occlusion. Normal MCA bifurcations. Distal MCA  branches perfused and symmetric. Vertebral arteries patent to the vertebrobasilar junction without stenosis. Basilar patent to its distal aspect. Superior cerebellar arteries patent bilaterally. Patent P1 segments, diminutive on the right. Near fetal origin of the right PCA with patent right posterior communicating artery. PCAs perfused to their distal aspects without stenosis. Possible diminutive left posterior communicating artery. IMPRESSION: 1.  No intracranial large vessel occlusion or significant stenosis. 2.  No hemodynamically significant stenosis in the neck. Electronically Signed   By: Merilyn Baba M.D.   On: 07/17/2021 23:08   MR BRAIN WO CONTRAST  Result Date: 07/17/2021 CLINICAL DATA:  Nonspecific dizziness. EXAM: MRI HEAD WITHOUT CONTRAST TECHNIQUE: Multiplanar, multiecho pulse sequences of the brain and surrounding structures were obtained without intravenous contrast. COMPARISON:  Head CT from earlier today FINDINGS: Brain: Patchy acute infarcts in the bilateral cerebellum and bilateral frontal, parietal, and occipital convexities. Patchy acute infarct in the right more than left centrum semiovale and in the left caudate head. Mild petechial hemorrhage at the right occipital cortex. No hematoma, hydrocephalus, or collection. Vascular: Normal flow voids Skull and upper cervical spine: New scattered bone lesions in the C2 right articular process, right para median clivus tip, and in the bilateral calvarium, affected areas marked on sagittal T2 weighted imaging. Sinuses/Orbits: Negative IMPRESSION: 1. Numerous small acute infarcts scattered in the brain and compatible with central embolic disease. 2. Multiple  bone lesions not seen October 2022, a malignant pattern. Recommend metastatic workup. Electronically Signed   By: Jorje Guild M.D.   On: 07/17/2021 05:42   MR Knee Right w/o contrast  Result Date: 06/29/2021 CLINICAL DATA:  Right knee pain for 2 weeks EXAM: MRI OF THE RIGHT KNEE WITHOUT  CONTRAST TECHNIQUE: Multiplanar, multisequence MR imaging of the knee was performed. No intravenous contrast was administered. COMPARISON:  None. FINDINGS: MENISCI Medial: Degeneration of the posterior horn of the medial meniscus. No discrete tear. Lateral: Degeneration of the body of the lateral meniscus with a linear component which does not extend to the articular surface. No discrete tear extending to the articular surface. LIGAMENTS Cruciates: ACL and PCL are intact. Collaterals: Medial collateral ligament is intact. Lateral collateral ligament complex is intact. CARTILAGE Patellofemoral: Partial-thickness cartilage loss with areas of full-thickness cartilage loss of the lateral patellar facet with subchondral reactive marrow changes. Partial-thickness cartilage loss of the medial patellofemoral compartment. Medial:  No chondral defect. Lateral: Partial-thickness cartilage loss of the weight-bearing surface of the lateral femoral condyle. JOINT: No joint effusion. Normal Hoffa's fat-pad. No plical thickening. POPLITEAL FOSSA: Popliteus tendon is intact. No Baker's cyst. EXTENSOR MECHANISM: Intact quadriceps tendon. Intact patellar tendon. Intact lateral patellar retinaculum. Intact medial patellar retinaculum. Intact MPFL. BONES: No aggressive osseous lesion. No fracture or dislocation. Other: No fluid collection or hematoma. Muscles are normal. IMPRESSION: 1. Partial-thickness cartilage loss with areas of full-thickness cartilage loss of the lateral patellar facet with subchondral reactive marrow changes. Partial-thickness cartilage loss of the medial patellofemoral compartment. 2. Partial-thickness cartilage loss of the weight-bearing surface of the lateral femoral condyle. 3. Degeneration of the body of the lateral meniscus with a linear component which does not extend to the articular surface. No discrete meniscal tear extending to the articular surface. Electronically Signed   By: Kathreen Devoid M.D.   On:  06/29/2021 10:09   ECHOCARDIOGRAM COMPLETE  Result Date: 07/17/2021    ECHOCARDIOGRAM REPORT   Patient Name:   [facility] Date of Exam: 07/17/2021 Medical Rec #:  [id number]     Height:       64.0 in Accession #:    [id number]    Weight:       137.0 lb Date of Birth:  07-02-1940     BSA:          1.666 m Patient Age:    53 years      BP:           131/66 mmHg Patient Gender: F             HR:           67 bpm. Exam Location:  [facility] Procedure: 2D Echo, Cardiac Doppler and Color Doppler Indications:     Stroke I63.9  History:         Patient has prior history of Echocardiogram examinations, most                  recent 02/25/2021. COPD, Arrythmias:Atrial Fibrillation;                  Signs/Symptoms:Murmur.  Sonographer:     Sherrie Sport Referring Phys:  [id number] Ivor Costa Diagnosing Phys: Ida Rogue MD  Sonographer Comments: No parasternal window and suboptimal apical window. Image acquisition challenging due to COPD. IMPRESSIONS  1. Left ventricular ejection fraction, by estimation, is 60 to 65%. The left ventricle has normal function. The left ventricle has no regional wall motion abnormalities.  Left ventricular diastolic parameters are consistent with Grade I diastolic dysfunction (impaired relaxation).  2. Right ventricular systolic function is normal. The right ventricular size is normal. There is mildly elevated pulmonary artery systolic pressure. The estimated right ventricular systolic pressure is 31.5 mmHg.  3. The mitral valve is normal in structure. No evidence of mitral valve regurgitation. No evidence of mitral stenosis.  4. The aortic valve is normal in structure. Aortic valve regurgitation is mild to moderate. No aortic stenosis is present.  5. The inferior vena cava is normal in size with greater than 50% respiratory variability, suggesting right atrial pressure of 3 mmHg. FINDINGS  Left Ventricle: Left ventricular ejection fraction, by estimation, is 60 to 65%. The left ventricle has normal  function. The left ventricle has no regional wall motion abnormalities. The left ventricular internal cavity size was normal in size. There is  no left ventricular hypertrophy. Left ventricular diastolic parameters are consistent with Grade I diastolic dysfunction (impaired relaxation). Right Ventricle: The right ventricular size is normal. No increase in right ventricular wall thickness. Right ventricular systolic function is normal. There is mildly elevated pulmonary artery systolic pressure. The tricuspid regurgitant velocity is 3.00  m/s, and with an assumed right atrial pressure of 5 mmHg, the estimated right ventricular systolic pressure is 40.0 mmHg. Left Atrium: Left atrial size was normal in size. Right Atrium: Right atrial size was normal in size. Pericardium: There is no evidence of pericardial effusion. Mitral Valve: The mitral valve is normal in structure. Mild mitral annular calcification. No evidence of mitral valve regurgitation. No evidence of mitral valve stenosis. MV peak gradient, 5.9 mmHg. The mean mitral valve gradient is 2.0 mmHg. Tricuspid Valve: The tricuspid valve is normal in structure. Tricuspid valve regurgitation is not demonstrated. No evidence of tricuspid stenosis. Aortic Valve: The aortic valve is normal in structure. Aortic valve regurgitation is mild to moderate. No aortic stenosis is present. Aortic valve mean gradient measures 2.5 mmHg. Aortic valve peak gradient measures 4.3 mmHg. Aortic valve area, by VTI measures 3.16 cm. Pulmonic Valve: The pulmonic valve was normal in structure. Pulmonic valve regurgitation is not visualized. No evidence of pulmonic stenosis. Aorta: The aortic root is normal in size and structure. Venous: The inferior vena cava is normal in size with greater than 50% respiratory variability, suggesting right atrial pressure of 3 mmHg. IAS/Shunts: No atrial level shunt detected by color flow Doppler.  LEFT VENTRICLE PLAX 2D LVIDd:         3.54 cm    Diastology LVIDs:         2.34 cm   LV e' medial:    5.22 cm/s LV PW:         0.98 cm   LV E/e' medial:  12.3 LV IVS:        0.89 cm   LV e' lateral:   7.40 cm/s LVOT diam:     2.00 cm   LV E/e' lateral: 8.7 LV SV:         66 LV SV Index:   40 LVOT Area:     3.14 cm  RIGHT VENTRICLE RV Basal diam:  3.40 cm RV S prime:     14.80 cm/s TAPSE (M-mode): 2.7 cm LEFT ATRIUM             Index        RIGHT ATRIUM           Index LA diam:        3.60 cm  2.16 cm/m   RA Area:     18.20 cm LA Vol (A2C):   91.5 ml 54.93 ml/m  RA Volume:   52.90 ml  31.76 ml/m LA Vol (A4C):   62.7 ml 37.64 ml/m LA Biplane Vol: 77.7 ml 46.64 ml/m  AORTIC VALVE AV Area (Vmax):    2.47 cm AV Area (Vmean):   2.53 cm AV Area (VTI):     3.16 cm AV Vmax:           104.05 cm/s AV Vmean:          68.000 cm/s AV VTI:            0.211 m AV Peak Grad:      4.3 mmHg AV Mean Grad:      2.5 mmHg LVOT Vmax:         81.90 cm/s LVOT Vmean:        54.700 cm/s LVOT VTI:          0.212 m LVOT/AV VTI ratio: 1.00  AORTA Ao Root diam: 2.50 cm MITRAL VALVE                TRICUSPID VALVE MV Area (PHT): 2.76 cm     TR Peak grad:   36.0 mmHg MV Area VTI:   2.13 cm     TR Vmax:        300.00 cm/s MV Peak grad:  5.9 mmHg MV Mean grad:  2.0 mmHg     SHUNTS MV Vmax:       1.21 m/s     Systemic VTI:  0.21 m MV Vmean:      72.7 cm/s    Systemic Diam: 2.00 cm MV Decel Time: 275 msec MV E velocity: 64.30 cm/s MV A velocity: 117.00 cm/s MV E/A ratio:  0.55 Ida Rogue MD Electronically signed by Ida Rogue MD Signature Date/Time: 07/17/2021/4:35:51 PM    Final    [place] CORE BIOPSY (LYMPH NODES)  Result Date: 07/21/2021 INDICATION: 81 year old with history of lung cancer. 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 Plan for ultrasound-guided liver lesion biopsy. Patient also notes a new nodule on the left side of her neck. EXAM: ULTRASOUND-GUIDED LEFT CERVICAL LYMPH NODE BIOPSY MEDICATIONS: None. ANESTHESIA/SEDATION: None  FLUOROSCOPY TIME:  None COMPLICATIONS: None immediate. PROCEDURE: Informed written consent was obtained from the patient after a thorough discussion of the procedural risks, benefits and alternatives. All questions were addressed. A timeout was performed prior to the initiation of the procedure. Liver was thoroughly evaluated with ultrasound. The liver is heterogeneous but a discrete lesion was not identified. Left side of the neck was evaluated with ultrasound and an abnormal small lymph node on the left side of the neck was identified. Left cervical lymph node was targeted for biopsy. The left side of the neck was prepped with chlorhexidine and sterile field was created. Skin was anesthetized with 1% lidocaine. Small incision was made. Using ultrasound guidance, an 18 gauge core device was directed into the lymph node. Four core biopsies were obtained and placed on a Telfa pad with saline. Bandage placed over the puncture site. FINDINGS: Liver is heterogeneous but no discrete lesions could be identified. Therefore, the neck was evaluated for supraclavicular lymphadenopathy. Patient noted a bump on the left side of the neck and there was a rounded small abnormal lymph node at the area of concern. There is also a slightly prominent left supraclavicular lymph node which was not amenable  for biopsy. The lymph node in the left mid neck was targeted and biopsied. Biopsy needle was confirmed within the lesion. No immediate bleeding or hematoma formation. IMPRESSION: 1. Ultrasound-guided core biopsy of a small but abnormal looking lymph node on the left side of the neck. 2. Ultrasound-guided liver biopsy was not performed because the liver lesions are not clearly visible on ultrasound. If the neck biopsy is inconclusive or negative, consider further evaluation with PET-CT. CT-guided liver lesion biopsy could be attempted as well. Electronically Signed   By: Markus Daft M.D.   On: 07/21/2021 15:33   CT CHEST ABDOMEN  PELVIS WO CONTRAST  Result Date: 07/17/2021 CLINICAL DATA:  Bone lesions seen on brain MRI. Evaluate for underlying malignancy. History of lung cancer. EXAM: CT CHEST, ABDOMEN AND PELVIS WITHOUT CONTRAST TECHNIQUE: Multidetector CT imaging of the chest, abdomen and pelvis was performed following the standard protocol without IV contrast. RADIATION DOSE REDUCTION: This exam was performed according to the departmental dose-optimization program which includes automated exposure control, adjustment of the mA and/or kV according to patient size and/or use of iterative reconstruction technique. COMPARISON:  Chest CT 04/10/2021 FINDINGS: CT CHEST FINDINGS Cardiovascular: The heart is normal in size. No pericardial effusion. The aorta is normal in caliber. Stable atherosclerotic calcifications. Remarkably no coronary artery calcifications. Mediastinum/Nodes: Progressive left hilar adenopathy the, difficult to measure without contrast. New subcarinal adenopathy with 12.5 mm node on image 28/2. 8.5 mm right paratracheal node on image 20/2. Lungs/Pleura: Enlarging left upper lobe/suprahilar mass measuring approximately 3 cm on image 33/4. Findings consistent with recurrent lung cancer and left hilar and mediastinal adenopathy. No new pulmonary nodules to suggest pulmonary metastatic disease. Progressive right basilar scarring changes and streaky basilar atelectasis. Musculoskeletal: No breast masses are identified. No supraclavicular adenopathy. A few scattered axillary lymph nodes are stable. Suspect scattered subtle slightly sclerotic bone lesions. CT ABDOMEN PELVIS FINDINGS Hepatobiliary: New diffuse hepatic metastatic disease. Numerous small lesions throughout both lobes of the liver. The largest lesion at the right hepatic dome measures 2.5 cm on image 43/2. The gallbladder is surgically absent. No common bile duct dilatation. Pancreas: No mass, inflammation or ductal dilatation. Spleen: Normal size.  No focal lesions.  Adrenals/Urinary Tract: Stable right adrenal gland nodule. No worrisome renal lesions are identified without contrast. 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 No complicating features. The small bowel and colon are grossly normal. Vascular/Lymphatic: Stable atherosclerotic calcifications involving the aorta and iliac arteries but no aneurysm. Small scattered mesenteric and retroperitoneal lymph nodes but no mass or overt adenopathy the. Reproductive: Surgically absent. Other: No pelvic mass or adenopathy. No free pelvic fluid collections. No inguinal mass or adenopathy. No abdominal wall hernia or subcutaneous lesions. Musculoskeletal: No lytic destructive bone lesions. No spinal canal compromise. IMPRESSION: 1. Enlarging left upper lobe/suprahilar mass with associated left hilar and mediastinal adenopathy consistent with recurrent lung cancer. 2. New diffuse hepatic metastatic disease. 3. Suspect scattered subtle slightly sclerotic bone lesions. No lytic or destructive bone lesions. 4. PET-CT may be helpful for accurate staging, if necessary. 5. Stable right adrenal gland nodule. 6. 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 * onc * Aortic Atherosclerosis (ICD10-I70.0). Electronically Signed   By: Marijo Sanes M.D.   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 Status post ultrasound-guided left cervical lymphadenopathy.  Pathology is pending. 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 Plan to send NGS on the current specimen. Patient needs to follow-up outpatient at the cancer center to review pathology results and management plan.   #Acute embolic stroke Neurology recommendation was reviewed.  Stroke work-up.  Echo, MRI head and neck, neurochecks per neurology patient will be started on Eliquis on  07/22/2021. Encourage patient to participate in physical therapy.- #Anemia, normocytic, CKD Anemia work-up showed normal vitamin B12, folate level, slightly increased TSH 4.9, negative M protein on SPEP, decreased IgG level.  Normal free light chain panel. iron panel showed ferritin of 242, TIBC 297, iron saturation 8.  Reticulocyte hemoglobin normal at 31.  This is consistent with anemia of chronic disease.  Slightly decreased iron saturation.  Continue oral iron supplementation.  #Thrombocytopenia, etiology unknown.  Immature platelet fraction is 7.8, inappropriately normal.  Possible decreased bone marrow production.  Close monitor.    Thank you for allowing me to participate in the care of this patient.   Earlie Server, MD, PhD Hematology Oncology  07/21/2021

## 2021-07-21 NOTE — Progress Notes (Signed)
PROGRESS NOTE    Natasha [name]  OVP:[id number] DOB: [birth year] DOA: 07/17/2021 PCP: Lesleigh Noe, MD  Assessment & Plan:   Principal Problem:   Acute embolic stroke [facility]) Active Problems:   Essential hypertension   Iron deficiency anemia   OSA (obstructive sleep apnea)   Paroxysmal atrial fibrillation (HCC)   Restless leg syndrome   CKD (chronic kidney disease) stage 4, GFR 15-29 ml/min (HCC)   HLD (hyperlipidemia)   Type II diabetes mellitus with renal manifestations (HCC)   COPD (chronic obstructive pulmonary disease) (HCC)   Chronic diastolic CHF (congestive heart failure) (HCC)   Leukocytosis   Bone lesion   Lung cancer (HCC)   Carpal tunnel syndrome on right   Lesion of liver   CVA (cerebral vascular accident) ([place])   Acute embolic stroke:  continue to hold eliquis, aspirin as per neuro. Continue  on statin. Echo shows EF 18-59%, grade I diastolic dysfunction, no regional wall motion abnormalities, no atrial level shunt detected. Was on eliquis for A-fib which is on hold for recent carpal tunnel release procedure.   HTN: continue on metoprolol. Continue to hold aldactone secondary AKI on CKD    IDA: continue on iron supplement   OSA: continue on CPAP    PAF: continue on amiodarone, metoprolol. Will restart eliquis on 07/22/21 as per neuro   Restless leg syndrome: continue on home dose of pramipexole  AKI on CKDIV: Cr is trending up from day prior. Avoid nephrotoxic meds    HLD: continue on statin    DM2: poorly controlled, HbA1c 9.4. Continue on SSI w/ accuchecks    COPD: w/o exacerbation. Continue on bronchodilators   Chronic diastolic MBP:JPET on 62/08/4693 showed EF of 60 to 65% with grade 1 diastolic dysfunction. Hold aldactone today secondary to low normal BP. CHF appears compensated    Leukocytosis: likely reactive. Will continue to monitor     Bone lesion and history of lung cancer : s/p hx of  radiation therapy.  MRI of the brain showed multiple bone  lesion in skull and c-spine, concerning for metastasized disease. Will go for biopsy today as per IR   Carpal tunnel syndrome on right:s/p of carpal tunnel release procedure by Dr. Rudene Christians on 07/15/21.  Thrombocytopenia: etiology unclear. Will continue to monitor    DVT prophylaxis: SCDs Code Status: full Family Communication: discussed pt's care w/ pt's husband at bedside and answered his questions  Disposition Plan: likely d/c to CIR   Level of care: Telemetry Medical  Status is: Inpatient Remains inpatient appropriate because: biopsy today and hopefully can be d/c to CIR tomorrow       Consultants:  Onco Neuro   Procedures:   Antimicrobials:    Subjective: Pt c/o fatigue   Objective: Vitals:   07/21/21 1218 07/21/21 1257 07/21/21 1316 07/21/21 1321  BP: (!) 124/49 (!) 105/52 (!) 127/48 (!) 122/46  Pulse: 72 72 74 72  Resp: $Remo'20 19 17 15  'wNIjj$ Temp: 98.3 F (36.8 C)     TempSrc: Oral     SpO2: 99% 98% 99% 100%  Weight:      Height:        Intake/Output Summary (Last 24 hours) at 07/21/2021 1342 Last data filed at 07/21/2021 0631 Gross per 24 hour  Intake 0 ml  Output 300 ml  Net -300 ml   Filed Weights   07/17/21 0346 07/19/21 0500 07/20/21 0500  Weight: 62.1 kg 61.4 kg 61 kg    Examination:  General exam: Appears comfortable  Respiratory system: clear breath sounds b/l. No rales or wheezes   Cardiovascular system: S1/S2+. No rubs or gallops Gastrointestinal system: Abd is soft, NT, ND & hypoactive bowel sounds  Central nervous system: alert and awake. Moves all extremities  Psychiatry: judgement and insight appear at baseline. Flat mood and affect     Data Reviewed: I have personally reviewed following labs and imaging studies  CBC: Recent Labs  Lab 07/17/21 0352 07/19/21 0648 07/20/21 0612 07/21/21 0533  WBC 11.9* 11.0* 11.9* 12.4*  HGB 9.5* 9.0* 9.3* 9.7*  HCT 29.1* 28.4* 29.0* 29.9*  MCV 86.9 87.9 87.1 88.2  PLT 131* 112* 98* 81*   Basic  Metabolic Panel: Recent Labs  Lab 07/17/21 0352 07/19/21 0648 07/20/21 0612 07/21/21 0533  NA 135 135 134* 138  K 4.7 4.2 4.2 4.3  CL 103 104 104 105  CO2 21* 18* 21* 20*  GLUCOSE 241* 155* 194* 194*  BUN 37* 34* 42* 51*  CREATININE 1.56* 1.43* 1.58* 1.81*  CALCIUM 8.5* 8.3* 8.2* 8.2*   GFR: Estimated Creatinine Clearance: 21 mL/min (A) (by C-G formula based on SCr of 1.81 mg/dL (H)). Liver Function Tests: Recent Labs  Lab 07/19/21 0648 07/20/21 0612 07/21/21 0533  AST 52* 47* 63*  ALT $Re'23 23 26  'hot$ ALKPHOS 204* 189* 210*  BILITOT 0.7 0.7 0.7  PROT 5.5* 5.1* 5.1*  ALBUMIN 2.6* 2.5* 2.4*   No results for input(s): LIPASE, AMYLASE in the last 168 hours. No results for input(s): AMMONIA in the last 168 hours. Coagulation Profile: Recent Labs  Lab 07/17/21 0352 07/21/21 0533  INR 1.3* 1.4*   Cardiac Enzymes: No results for input(s): CKTOTAL, CKMB, CKMBINDEX, TROPONINI in the last 168 hours. BNP (last 3 results) No results for input(s): PROBNP in the last 8760 hours. HbA1C: No results for input(s): HGBA1C in the last 72 hours.  CBG: Recent Labs  Lab 07/20/21 1135 07/20/21 1556 07/20/21 2104 07/21/21 0936 07/21/21 1219  GLUCAP 172* 132* 149* 192* 195*   Lipid Profile: No results for input(s): CHOL, HDL, LDLCALC, TRIG, CHOLHDL, LDLDIRECT in the last 72 hours.  Thyroid Function Tests: No results for input(s): TSH, T4TOTAL, FREET4, T3FREE, THYROIDAB in the last 72 hours.  Anemia Panel: No results for input(s): VITAMINB12, FOLATE, FERRITIN, TIBC, IRON, RETICCTPCT in the last 72 hours.  Sepsis Labs: No results for input(s): PROCALCITON, LATICACIDVEN in the last 168 hours.  Recent Results (from the past 240 hour(s))  Resp Panel by RT-PCR (Flu A&B, Covid) Nasopharyngeal Swab     Status: None   Collection Time: 07/17/21  6:57 AM   Specimen: Nasopharyngeal Swab; Nasopharyngeal(NP) swabs in vial transport medium  Result Value Ref Range Status   SARS Coronavirus 2  by RT PCR NEGATIVE NEGATIVE Final    Comment: (NOTE) SARS-CoV-2 target nucleic acids are NOT DETECTED.  The SARS-CoV-2 RNA is generally detectable in upper respiratory specimens during the acute phase of infection. The lowest concentration of SARS-CoV-2 viral copies this assay can detect is 138 copies/mL. A negative result does not preclude SARS-Cov-2 infection and should not be used as the sole basis for treatment or other patient management decisions. A negative result may occur with  improper specimen collection/handling, submission of specimen other than nasopharyngeal swab, presence of viral mutation(s) within the areas targeted by this assay, and inadequate number of viral copies(<138 copies/mL). A negative result must be combined with clinical observations, patient history, and epidemiological information. The expected result is Negative.  Fact Sheet for Patients:  EntrepreneurPulse.com.au  Fact Sheet for Healthcare Providers:  IncredibleEmployment.be  This test is no t yet approved or cleared by the [place] FDA and  has been authorized for detection and/or diagnosis of SARS-CoV-2 by FDA under an Emergency Use Authorization (EUA). This EUA will remain  in effect (meaning this test can be used) for the duration of the COVID-19 declaration under Section 564(b)(1) of the Act, 21 U.S.C.section 360bbb-3(b)(1), unless the authorization is terminated  or revoked sooner.       Influenza A by PCR NEGATIVE NEGATIVE Final   Influenza B by PCR NEGATIVE NEGATIVE Final    Comment: (NOTE) The Xpert Xpress SARS-CoV-2/FLU/RSV plus assay is intended as an aid in the diagnosis of influenza from Nasopharyngeal swab specimens and should not be used as a sole basis for treatment. Nasal washings and aspirates are unacceptable for Xpert Xpress SARS-CoV-2/FLU/RSV testing.  Fact Sheet for Patients: EntrepreneurPulse.com.au  Fact  Sheet for Healthcare Providers: IncredibleEmployment.be  This test is not yet approved or cleared by the [place] FDA and has been authorized for detection and/or diagnosis of SARS-CoV-2 by FDA under an Emergency Use Authorization (EUA). This EUA will remain in effect (meaning this test can be used) for the duration of the COVID-19 declaration under Section 564(b)(1) of the Act, 21 U.S.C. section 360bbb-3(b)(1), unless the authorization is terminated or revoked.  Performed at [facility], [address], [place], [place] [address]          Radiology Studies: No results found.      Scheduled Meds:   stroke: mapping our early stages of recovery book   Does not apply Once   amiodarone  400 mg Oral Daily   calcium-vitamin D  1 tablet Oral Daily   chlorhexidine  5 mL Mouth/Throat BID   docusate sodium  200 mg Oral BID   ferrous gluconate  324 mg Oral q AM   gabapentin  300 mg Oral QHS   insulin aspart  0-5 Units Subcutaneous QHS   insulin aspart  0-9 Units Subcutaneous TID WC   metoprolol succinate  25 mg Oral Daily   pantoprazole  40 mg Oral Daily   polyethylene glycol  17 g Oral Daily   pramipexole  1 mg Oral QHS   rosuvastatin  20 mg Oral QHS   sertraline  50 mg Oral QHS   Continuous Infusions:   LOS: 3 days    Time spent: 16 mins    Wyvonnia Dusky, MD [facility] Pager [phone]  If 7PM-7AM, please contact night-coverage 07/21/2021, 1:42 PM

## 2021-07-21 NOTE — Progress Notes (Signed)
Inpatient Rehab Admissions Coordinator:   I spoke with Pt. And her husband again regarding CIR vs SNF for rehab. They feel that Pt. Would struggle with tolerating 3 hours of therapy on CIR and would like to pursue SNF, preferably near their home in [place]. I will notify TOC and sign off.   Clemens Catholic, [place], [place] Admissions Coordinator  [phone] ([place]) [phone] (office)

## 2021-07-22 ENCOUNTER — Inpatient Hospital Stay: Payer: Medicare Other

## 2021-07-22 ENCOUNTER — Ambulatory Visit: Payer: Medicare Other

## 2021-07-22 DIAGNOSIS — I639 Cerebral infarction, unspecified: Secondary | ICD-10-CM | POA: Diagnosis not present

## 2021-07-22 DIAGNOSIS — N179 Acute kidney failure, unspecified: Secondary | ICD-10-CM

## 2021-07-22 DIAGNOSIS — M899 Disorder of bone, unspecified: Secondary | ICD-10-CM | POA: Diagnosis not present

## 2021-07-22 LAB — COMPREHENSIVE METABOLIC PANEL
ALT: 34 U/L (ref 0–44)
AST: 80 U/L — ABNORMAL HIGH (ref 15–41)
Albumin: 2.5 g/dL — ABNORMAL LOW (ref 3.5–5.0)
Alkaline Phosphatase: 237 U/L — ABNORMAL HIGH (ref 38–126)
Anion gap: 14 (ref 5–15)
BUN: 66 mg/dL — ABNORMAL HIGH (ref 8–23)
CO2: 17 mmol/L — ABNORMAL LOW (ref 22–32)
Calcium: 8.4 mg/dL — ABNORMAL LOW (ref 8.9–10.3)
Chloride: 104 mmol/L (ref 98–111)
Creatinine, Ser: 2.24 mg/dL — ABNORMAL HIGH (ref 0.44–1.00)
GFR, Estimated: 22 mL/min — ABNORMAL LOW (ref >60)
Glucose, Bld: 238 mg/dL — ABNORMAL HIGH (ref 70–99)
Potassium: 4.4 mmol/L (ref 3.5–5.1)
Sodium: 135 mmol/L (ref 135–145)
Total Bilirubin: 0.9 mg/dL (ref 0.3–1.2)
Total Protein: 5.5 g/dL — ABNORMAL LOW (ref 6.5–8.1)

## 2021-07-22 LAB — CBC
HCT: 31.7 % — ABNORMAL LOW (ref 36.0–46.0)
Hemoglobin: 10.1 g/dL — ABNORMAL LOW (ref 12.0–15.0)
MCH: 28.1 pg (ref 26.0–34.0)
MCHC: 31.9 g/dL (ref 30.0–36.0)
MCV: 88.1 fL (ref 80.0–100.0)
Platelets: 82 10*3/uL — ABNORMAL LOW (ref 150–400)
RBC: 3.6 MIL/uL — ABNORMAL LOW (ref 3.87–5.11)
RDW: 16.8 % — ABNORMAL HIGH (ref 11.5–15.5)
WBC: 12.5 10*3/uL — ABNORMAL HIGH (ref 4.0–10.5)
nRBC: 0 % (ref 0.0–0.2)

## 2021-07-22 LAB — GLUCOSE, CAPILLARY
Glucose-Capillary: 229 mg/dL — ABNORMAL HIGH (ref 70–99)
Glucose-Capillary: 214 mg/dL — ABNORMAL HIGH (ref 70–99)
Glucose-Capillary: 212 mg/dL — ABNORMAL HIGH (ref 70–99)
Glucose-Capillary: 235 mg/dL — ABNORMAL HIGH (ref 70–99)

## 2021-07-22 MED ORDER — ASPIRIN 81 MG PO CHEW
81.0000 mg | CHEWABLE_TABLET | Freq: Every day | ORAL | Status: DC
  Administered 2021-07-22 – 2021-07-23 (×2): 81 mg via ORAL
  Filled 2021-07-22 (×2): qty 1

## 2021-07-22 MED ORDER — APIXABAN 5 MG PO TABS: 5.0000 mg | ORAL_TABLET | Freq: Two times a day (BID) | ORAL | Status: DC

## 2021-07-22 MED ORDER — ROSUVASTATIN CALCIUM 10 MG PO TABS
10.0000 mg | ORAL_TABLET | Freq: Every day | ORAL | Status: DC
  Administered 2021-07-22 – 2021-07-30 (×9): 10 mg via ORAL
  Filled 2021-07-22 (×10): qty 1

## 2021-07-22 MED ORDER — SODIUM CHLORIDE 0.9 % IV SOLN
INTRAVENOUS | Status: AC
Start: 2021-07-22 — End: 2021-07-23

## 2021-07-22 MED ORDER — APIXABAN 2.5 MG PO TABS
2.5000 mg | ORAL_TABLET | Freq: Two times a day (BID) | ORAL | Status: DC
  Administered 2021-07-22: 2.5 mg via ORAL
  Filled 2021-07-22: qty 1

## 2021-07-22 NOTE — Progress Notes (Signed)
Inpatient Diabetes Program Recommendations  AACE/ADA: New Consensus Statement on Inpatient Glycemic Control   Target Ranges:  Prepandial:   less than 140 mg/dL      Peak postprandial:   less than 180 mg/dL (1-2 hours)      Critically ill patients:  140 - 180 mg/dL    Latest Reference Range & Units 07/22/21 07:46  Glucose-Capillary 70 - 99 mg/dL 229 (H)    Latest Reference Range & Units 07/21/21 09:36 07/21/21 12:19 07/21/21 21:50  Glucose-Capillary 70 - 99 mg/dL 192 (H) 195 (H) 216 (H)    Review of Glycemic Control  Diabetes history: DM2 Outpatient Diabetes medications: Tresiba 15 units QHS, Janumet 50/1000 mg 2 tablets Daily, Jardiance 25 mg daily Current orders for Inpatient glycemic control: Novolog 0-9 units TID with meals, Novolog 0-5 units QHS  Inpatient Diabetes Program Recommendations:    Insulin: Please consider ordering Semglee 3 units daily.  Thanks, Barnie Alderman, RN, MSN, CDE Diabetes Coordinator Inpatient Diabetes Program [phone] (Team Pager from 8am to 5pm)

## 2021-07-22 NOTE — Care Management Important Message (Signed)
Important Message  Patient Details  Name: Natasha [name] MRN: [id number] Date of Birth: 09-18-1940   Medicare Important Message Given:  Yes     [name] A Eulia Hatcher 07/22/2021, 11:09 AM

## 2021-07-22 NOTE — Progress Notes (Addendum)
PROGRESS NOTE   HPI was taken from Dr. Blaine Hamper: Natasha [name] is a 81 y.o. female with medical history significant of A Fib on Eliquis (currently on hold), hypertension, hyperlipidemia, diabetes mellitus, COPD, asthma, GERD, depression, former smoker, RLS, OSA, CKD-4, dCHF, lung cancer (s/p of radiation therapy), iron deficiency anemia, chronic pain, who presents with dizziness.   She was last known normal at about 4:00 PM 07/16/21 when she went to sleep and felt fine. Pt states that she awoke at approximately midnight on 07/17/2021 at which time she has new onset dizziness. She felt like the room was spinning. Symptom is worse with movement and standing.  She states that she has very minimal left-leg weakness.  No unilateral numbness or tinglings in extremities.  No facial droop or slurred speech.  No difficulty swallowing.  Denies headache, hearing loss or vision loss. Patient has chronic mild dry cough, which has not changed.  No chest pain, shortness breath.  No nausea, vomiting, diarrhea or abdominal pain.  No symptoms of UTI.   Of note, pt is typically on chronic anticoagulation via Eliquis, however, this medication was held transiently for right-sided carpal tunnel release, which occurred on 07/15/2021.   Data Reviewed and ED Course: pt was found to have WBC 11.9, negative COVID PCR, stable renal function, temperature normal, blood pressure 129/64, heart rate 70, RR 16, oxygen saturation 94% on room air.  Patient is placed on telemetry bed for observation.  Dr. Tasia Catchings of oncology is consulted.  Dr. Saralyn Pilar of neurology are consulted.   Hospital course as per Dr. Jimmye Norman 2/24-28/23: Pt presented w/ acute embolic CVA. Echo showed EF 40-98%, grade I diastolic dysfunction, no regional wall motion abnormalities, no atrial level shunt detected. Eliquis was held for several days and restarted it 07/22/21 as per neuro. Furthermore, pt has hx of lung cancer and was found to have likely metastatic disease on MRI &  CT. Pt is s/p US guided left cervical lymph node biopsy as small liver lesions are NOT visible on [place] as per IR. Pathology is pending. PT/OT initially evaluated the pt and recommended CIR but now recs SNF. Pt has been accepted at [facility] and they can accept the pt tomorrow.   Addendum: Repeat MRI brain today showed Numerous areas of acute infarct in the cerebrum and cerebellum bilaterally compatible with acute embolic infarction. There has been progression of acute infarcts since the recent MRI of 07/17/2021. Findings suggest recurrent emboli. Progression of infarct left occipital pole. Petechial hemorrhage in this area unchanged from the prior study. No other hemorrhage.Lesions in the calvarium bilaterally, suspicious for metastatic disease. Holding amio & metoprolol to allow permissive HTN as per neuro but restart amio if pt goes into a. fib. Hold eliquis again today & restart on Saturday after CT head r/o interval hemorrhagic conversion as per neuro. Started on aspirin daily while eliquis is held but stop aspirin when eliquis restarted back as per neuro. See neuro's note for more information. Pt will NOT d/c to SNF tomorrow then   [name]  JXB:[id number] DOB: Jun 27, 1940 DOA: 07/17/2021 PCP: Lesleigh Noe, MD  Assessment & Plan:   Principal Problem:   Acute embolic stroke [facility]) Active Problems:   Essential hypertension   Iron deficiency anemia   OSA (obstructive sleep apnea)   Paroxysmal atrial fibrillation (HCC)   Restless leg syndrome   CKD (chronic kidney disease) stage 4, GFR 15-29 ml/min (HCC)   HLD (hyperlipidemia)   Type II diabetes mellitus with renal manifestations ([place])  COPD (chronic obstructive pulmonary disease) (HCC)   Chronic diastolic CHF (congestive heart failure) (HCC)   Leukocytosis   Bone lesion   Lung cancer (HCC)   Carpal tunnel syndrome on right   Lesion of liver   CVA (cerebral vascular accident) ([place])   Acute embolic stroke:  restart eliquis  07/22/21 as per neuro. Continue  on statin. Echo shows EF 53-97%, grade I diastolic dysfunction, no regional wall motion abnormalities, no atrial level shunt detected. Was previously on eliquis for A-fib which is on hold for recent carpal tunnel release procedure.   HTN: continue on metoprolol. Continue to hold aldactone secondary AKI on CKD    IDA: continue on iron supplement   OSA: continue on CPAP    PAF: continue on metoprolol, amio & restarted eliquis 07/22/21   Restless leg syndrome: continue on home dose of pramipexole   AKI on CKDIV: Cr continues to trend up. Started on IVFs    HLD: continue on statin     DM2: HbA1c 9.4, poorly controlled. Continue on SSI w/ accuchecks    COPD: w/o exacerbation. Continue on bronchodilators    Chronic diastolic QBH:ALPF on 79/0/2409 showed EF of 60 to 65% with grade 1 diastolic dysfunction. Hold aldactone today secondary to low normal BP. CHF appears compensated    Leukocytosis: likely reactive. Will continue to monitor    Bone lesion and history of lung cancer : s/p hx of  radiation therapy.  MRI of the brain showed multiple bone lesion in skull and c-spine, concerning for metastasized disease. S/p lymph node biopsy as per IR on 07/21/21   Carpal tunnel syndrome on right:s/p of carpal tunnel release procedure by Dr. Rudene Christians on 07/15/21.  Thrombocytopenia: etiology unclear. Will continue to monitor    DVT prophylaxis: eliquis  Code Status: full Family Communication: discussed pt's care w/ pt's husband at bedside and answered his questions  Disposition Plan: d/c to [facility] tomorrow  Level of care: Telemetry Medical  Status is: Inpatient Remains inpatient appropriate because: d/c to [facility] as that is when they will accept the pt       Consultants:  [facility]   Procedures:   Antimicrobials:    Subjective: Pt c/o malaise    Objective: Vitals:   07/21/21 1624 07/21/21 2028 07/21/21 2358 07/22/21 0340   BP: (!) 117/50 (!) 113/46 (!) 120/45 (!) 119/48  Pulse: 71 73 78 74  Resp: $Remo'20 16 18   'wOBKf$ Temp: 98.3 F (36.8 C) 98.2 F (36.8 C) 98.1 F (36.7 C) (!) 97.5 F (36.4 C)  TempSrc: Oral Oral Oral   SpO2: 94% 92% 94% 98%  Weight:      Height:        Intake/Output Summary (Last 24 hours) at 07/22/2021 0730 Last data filed at 07/22/2021 0606 Gross per 24 hour  Intake --  Output 1100 ml  Net -1100 ml   Filed Weights   07/17/21 0346 07/19/21 0500 07/20/21 0500  Weight: 62.1 kg 61.4 kg 61 kg    Examination:  General exam: Appears calm & comfortable  Respiratory system: clear breath sounds b/l  Cardiovascular system: S1 & S2+. No rubs or clicks Gastrointestinal system: abd is soft, NT, ND & hypoactive bowel sounds  Central nervous system: alert and awake. Moves all extremities  Psychiatry: judgement and insight appears normal. Flat mood and affect      Data Reviewed: I have personally reviewed following labs and imaging studies  CBC: Recent Labs  Lab 07/17/21 0352 07/19/21  6833 07/20/21 0612 07/21/21 0533 07/22/21 0504  WBC 11.9* 11.0* 11.9* 12.4* 12.5*  HGB 9.5* 9.0* 9.3* 9.7* 10.1*  HCT 29.1* 28.4* 29.0* 29.9* 31.7*  MCV 86.9 87.9 87.1 88.2 88.1  PLT 131* 112* 98* 81* 82*   Basic Metabolic Panel: Recent Labs  Lab 07/17/21 0352 07/19/21 0648 07/20/21 0612 07/21/21 0533 07/22/21 0504  NA 135 135 134* 138 135  K 4.7 4.2 4.2 4.3 4.4  CL 103 104 104 105 104  CO2 21* 18* 21* 20* 17*  GLUCOSE 241* 155* 194* 194* 238*  BUN 37* 34* 42* 51* 66*  CREATININE 1.56* 1.43* 1.58* 1.81* 2.24*  CALCIUM 8.5* 8.3* 8.2* 8.2* 8.4*   GFR: Estimated Creatinine Clearance: 17 mL/min (A) (by C-G formula based on SCr of 2.24 mg/dL (H)). Liver Function Tests: Recent Labs  Lab 07/19/21 0648 07/20/21 0612 07/21/21 0533 07/22/21 0504  AST 52* 47* 63* 80*  ALT 23 23 26  34  ALKPHOS 204* 189* 210* 237*  BILITOT 0.7 0.7 0.7 0.9  PROT 5.5* 5.1* 5.1* 5.5*  ALBUMIN 2.6* 2.5* 2.4*  2.5*   No results for input(s): LIPASE, AMYLASE in the last 168 hours. No results for input(s): AMMONIA in the last 168 hours. Coagulation Profile: Recent Labs  Lab 07/17/21 0352 07/21/21 0533  INR 1.3* 1.4*   Cardiac Enzymes: No results for input(s): CKTOTAL, CKMB, CKMBINDEX, TROPONINI in the last 168 hours. BNP (last 3 results) No results for input(s): PROBNP in the last 8760 hours. HbA1C: No results for input(s): HGBA1C in the last 72 hours.  CBG: Recent Labs  Lab 07/20/21 1556 07/20/21 2104 07/21/21 0936 07/21/21 1219 07/21/21 2150  GLUCAP 132* 149* 192* 195* 216*   Lipid Profile: No results for input(s): CHOL, HDL, LDLCALC, TRIG, CHOLHDL, LDLDIRECT in the last 72 hours.  Thyroid Function Tests: No results for input(s): TSH, T4TOTAL, FREET4, T3FREE, THYROIDAB in the last 72 hours.  Anemia Panel: No results for input(s): VITAMINB12, FOLATE, FERRITIN, TIBC, IRON, RETICCTPCT in the last 72 hours.  Sepsis Labs: No results for input(s): PROCALCITON, LATICACIDVEN in the last 168 hours.  Recent Results (from the past 240 hour(s))  Resp Panel by RT-PCR (Flu A&B, Covid) Nasopharyngeal Swab     Status: None   Collection Time: 07/17/21  6:57 AM   Specimen: Nasopharyngeal Swab; Nasopharyngeal(NP) swabs in vial transport medium  Result Value Ref Range Status   SARS Coronavirus 2 by RT PCR NEGATIVE NEGATIVE Final    Comment: (NOTE) SARS-CoV-2 target nucleic acids are NOT DETECTED.  The SARS-CoV-2 RNA is generally detectable in upper respiratory specimens during the acute phase of infection. The lowest concentration of SARS-CoV-2 viral copies this assay can detect is 138 copies/mL. A negative result does not preclude SARS-Cov-2 infection and should not be used as the sole basis for treatment or other patient management decisions. A negative result may occur with  improper specimen collection/handling, submission of specimen other than nasopharyngeal swab, presence of  viral mutation(s) within the areas targeted by this assay, and inadequate number of viral copies(<138 copies/mL). A negative result must be combined with clinical observations, patient history, and epidemiological information. The expected result is Negative.  Fact Sheet for Patients:  07/19/21  Fact Sheet for Healthcare Providers:  BloggerCourse.com  This test is no t yet approved or cleared by the [employer] FDA and  has been authorized for detection and/or diagnosis of SARS-CoV-2 by FDA under an Emergency Use Authorization (EUA). This EUA will remain  in effect (meaning this test  can be used) for the duration of the COVID-19 declaration under Section 564(b)(1) of the Act, 21 U.S.C.section 360bbb-3(b)(1), unless the authorization is terminated  or revoked sooner.       Influenza A by PCR NEGATIVE NEGATIVE Final   Influenza B by PCR NEGATIVE NEGATIVE Final    Comment: (NOTE) The Xpert Xpress SARS-CoV-2/FLU/RSV plus assay is intended as an aid in the diagnosis of influenza from Nasopharyngeal swab specimens and should not be used as a sole basis for treatment. Nasal washings and aspirates are unacceptable for Xpert Xpress SARS-CoV-2/FLU/RSV testing.  Fact Sheet for Patients: EntrepreneurPulse.com.au  Fact Sheet for Healthcare Providers: IncredibleEmployment.be  This test is not yet approved or cleared by the [place] FDA and has been authorized for detection and/or diagnosis of SARS-CoV-2 by FDA under an Emergency Use Authorization (EUA). This EUA will remain in effect (meaning this test can be used) for the duration of the COVID-19 declaration under Section 564(b)(1) of the Act, 21 U.S.C. section 360bbb-3(b)(1), unless the authorization is terminated or revoked.  Performed at [facility], [address] [place], [place] [address]          Radiology  Studies: [place] CORE BIOPSY (LYMPH NODES)  Result Date: 07/21/2021 INDICATION: 81 year old with history of lung cancer. 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 Plan for ultrasound-guided liver lesion biopsy. Patient also notes a new nodule on the left side of her neck. EXAM: ULTRASOUND-GUIDED LEFT CERVICAL LYMPH NODE BIOPSY MEDICATIONS: None. ANESTHESIA/SEDATION: None FLUOROSCOPY TIME:  None COMPLICATIONS: None immediate. PROCEDURE: Informed written consent was obtained from the patient after a thorough discussion of the procedural risks, benefits and alternatives. All questions were addressed. A timeout was performed prior to the initiation of the procedure. Liver was thoroughly evaluated with ultrasound. The liver is heterogeneous but a discrete lesion was not identified. Left side of the neck was evaluated with ultrasound and an abnormal small lymph node on the left side of the neck was identified. Left cervical lymph node was targeted for biopsy. The left side of the neck was prepped with chlorhexidine and sterile field was created. Skin was anesthetized with 1% lidocaine. Small incision was made. Using ultrasound guidance, an 18 gauge core device was directed into the lymph node. Four core biopsies were obtained and placed on a Telfa pad with saline. Bandage placed over the puncture site. FINDINGS: Liver is heterogeneous but no discrete lesions could be identified. Therefore, the neck was evaluated for supraclavicular lymphadenopathy. Patient noted a bump on the left side of the neck and there was a rounded small abnormal lymph node at the area of concern. There is also a slightly prominent left supraclavicular lymph node which was not amenable for biopsy. The lymph node in the left mid neck was targeted and biopsied. Biopsy needle was confirmed within the lesion. No immediate bleeding or hematoma formation. IMPRESSION: 1. Ultrasound-guided core biopsy of a small  but abnormal looking lymph node on the left side of the neck. 2. Ultrasound-guided liver biopsy was not performed because the liver lesions are not clearly visible on ultrasound. If the neck biopsy is inconclusive or negative, consider further evaluation with PET-CT. CT-guided liver lesion biopsy could be attempted as well. Electronically Signed   By: Markus Daft M.D.   On: 07/21/2021 15:33        Scheduled Meds:   stroke: mapping our early stages of recovery book   Does not apply Once   amiodarone  400 mg Oral Daily   calcium-vitamin D  1 tablet Oral Daily   chlorhexidine  5 mL Mouth/Throat BID   docusate sodium  200 mg Oral BID   ferrous gluconate  324 mg Oral q AM   gabapentin  300 mg Oral QHS   insulin aspart  0-5 Units Subcutaneous QHS   insulin aspart  0-9 Units Subcutaneous TID WC   metoprolol succinate  25 mg Oral Daily   pantoprazole  40 mg Oral Daily   polyethylene glycol  17 g Oral Daily   pramipexole  1 mg Oral QHS   rosuvastatin  20 mg Oral QHS   sertraline  50 mg Oral QHS   Continuous Infusions:  sodium chloride       LOS: 4 days    Time spent: 15 mins    Wyvonnia Dusky, MD [facility] Pager [phone]  If 7PM-7AM, please contact night-coverage 07/22/2021, 7:30 AM

## 2021-07-22 NOTE — Progress Notes (Addendum)
Physical Therapy Treatment Patient Details Name: Natasha [name] MRN: [id number] DOB: 1940/08/28 Today's Date: 07/22/2021   History of Present Illness Natasha [name] is a 81 y.o. female with history of atrial fibrillation on Eliquis, CHF, hypertension, hyperlipidemia, diabetes, COPD, chronic kidney disease, lung CA, depression, and recent R carpal tunnel release (Jul 15, 2021), who presents to the emergency department with complaints of vertigo. 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    PT Comments    Pt sitting EOB w/ OT upon PT entrance into room today. Pt was unable to perform 2 attempts of sit to stand due to requiring MaxAx2 in order to initiate transfer and to maintain static balance. Due to Pt's report of fatigue any further transfers or mobility was deferred. Pt returned to bed w/ maxA for sit to supine transfer. PT attempted to have patient perform "General LE Exercises" while lying in bed, but Pt required AAROM to complete exercises due to weakness and her dozing on and off throughout. Pt will benefit from continued skilled PT in order to improve LE strength, mobility, gait, and restore PLOF. Current discharge recommendation needs to be updated due to the level of assistance required by the patient to ensure safety and improve overall function, as well as a result of the patient's tolerance to rehabilitation sessions.   Recommendations for follow up therapy are one component of a multi-disciplinary discharge planning process, led by the attending physician.  Recommendations may be updated based on patient status, additional functional criteria and insurance authorization.  Follow Up Recommendations  Skilled nursing-short term rehab (<3 hours/day)     Assistance Recommended at Discharge Frequent or constant Supervision/Assistance  Patient can return home with the following Help with stairs or ramp for entrance;Assist  for transportation;Assistance with cooking/housework;A lot of help with walking and/or transfers;Two people to help with walking and/or transfers;Direct supervision/assist for medications management;Direct supervision/assist for financial management;A lot of help with bathing/dressing/bathroom   Equipment Recommendations  Other (comment)    Recommendations for Other Services       Precautions / Restrictions Precautions Precautions: Fall Precaution Comments: recent carpal tunnel sx R UE Restrictions Weight Bearing Restrictions: No     Mobility  Bed Mobility           Sit to supine: Max assist   General bed mobility comments: Max assist to progress BLEs into bed and reposition to [facility]    Transfers Overall transfer level: Needs assistance Equipment used: Rolling walker (2 wheels) Transfers: Sit to/from Stand Sit to Stand: Max assist, +2 physical assistance                Ambulation/Gait                   Stairs             Wheelchair Mobility    Modified Rankin (Stroke Patients Only)       Balance Overall balance assessment: Needs assistance Sitting-balance support: Bilateral upper extremity supported, Feet supported Sitting balance-Leahy Scale: Poor Sitting balance - Comments: Intermittently requires MOD-MAX A for static sitting balance. Only able to maintain static sitting balance for ~2 mins with CGA Postural control: Left lateral lean Standing balance support: Bilateral upper extremity supported, During functional activity Standing balance-Leahy Scale: Zero Standing balance comment: Requires MAX A to maintain static standing balance  Cognition Arousal/Alertness: Awake/alert Behavior During Therapy: Flat affect Overall Cognitive Status: Impaired/Different from baseline                                          Exercises General Exercises - Lower Extremity Heel Slides: AAROM,  Both, 10 reps Straight Leg Raises: AAROM, Both, 10 reps    General Comments        Pertinent Vitals/Pain Pain Assessment Pain Assessment: Faces Pain Score: 4  Faces Pain Scale: Hurts even more Pain Location: b/l shoulders Pain Descriptors / Indicators: Discomfort, Guarding, Grimacing Pain Intervention(s): Limited activity within patient's tolerance, Monitored during session    Home Living                          Prior Function            PT Goals (current goals can now be found in the care plan section) Progress towards PT goals: Progressing toward goals    Frequency    7X/week      PT Plan Discharge plan needs to be updated    Co-evaluation   Reason for Co-Treatment: Complexity of the patient's impairments (multi-system involvement) PT goals addressed during session: Mobility/safety with mobility;Balance OT goals addressed during session: ADL's and self-care;Proper use of Adaptive equipment and DME      AM-PAC PT "6 Clicks" Mobility   Outcome Measure  Help needed turning from your back to your side while in a flat bed without using bedrails?: A Lot Help needed moving from lying on your back to sitting on the side of a flat bed without using bedrails?: A Lot Help needed moving to and from a bed to a chair (including a wheelchair)?: Total Help needed standing up from a chair using your arms (e.g., wheelchair or bedside chair)?: Total Help needed to walk in hospital room?: Total Help needed climbing 3-5 steps with a railing? : Total 6 Click Score: 8    End of Session Equipment Utilized During Treatment: Gait belt Activity Tolerance: Patient limited by fatigue;Patient limited by pain Patient left: in bed;with call bell/phone within reach;with bed alarm set Nurse Communication: Mobility status PT Visit Diagnosis: Unsteadiness on feet (R26.81);Other abnormalities of gait and mobility (R26.89);Difficulty in walking, not elsewhere classified (R26.2)      Time: 5974-7185 PT Time Calculation (min) (ACUTE ONLY): 14 min  Charges:  $Therapeutic Exercise: 8-22 mins                      Jonnie Kind, SPT 07/22/2021, 11:51 AM

## 2021-07-22 NOTE — Progress Notes (Signed)
Dr Jimmye Norman and Dr Quinn Axe made aware that MRI result is in the computer

## 2021-07-22 NOTE — Progress Notes (Signed)
S: Neurology reconsulted 2/2 increased weakness BLE and diplopia. MRI brain wo contrast today showed multiple new embolic strokes since last MRI 5 days ago with the largest being in the L occipital lobe (personal review).  O:  Vitals:   07/22/21 0823 07/22/21 1219  BP: (!) 114/47 (!) 105/47  Pulse: 76 72  Resp: 18 17  Temp: (!) 97.5 F (36.4 C) 98.1 F (36.7 C)  SpO2: 94% 95%    Physical Exam Gen: A&Ox4, NAD HEENT: Atraumatic, normocephalic; oropharynx clear, tongue without atrophy or fasciculations. Resp: CTAB, normal work of breathing CV: RRR, extremities appear well-perfused. Abd: soft/NT/ND Extrem: Nml bulk; no cyanosis, clubbing, or edema.  Neuro: *MS: A&O x4. Follows multi-step commands.  *Speech: no dysarthria or aphasia, able to name and repeat. *CN:    I: Deferred   II,III: PERRLA, RHH, optic discs not visualized 2/2 pupillary constriction   III,IV,VI: EOMI w/o nystagmus, no ptosis   V: Sensation intact from V1 to V3 to LT   VII: Eyelid closure was full.  L UMN facial droop   VIII: Hearing intact to voice   IX,X: Voice normal, palate elevates symmetrically    XI: SCM/trap 5/5 bilat   XII: Tongue protrudes midline, no atrophy or fasciculations  *Motor:   Normal bulk.  No tremor, rigidity or bradykinesia. No pronator drift. RUE full strength throughout, LUE 4/5 diffusely with drift but not to bed, able to wiggle toes BLE but no movement anti-gravity *Sensory: SILT *Coordination:  ataxia L FNF *Reflexes:  1+ and symmetric throughout without clonus; toes down-going bilat *Gait: deferred  NIHSS 1a Level of Conscious.: 0 1b LOC Questions: 0 1c LOC Commands: 0 2 Best Gaze: 0 3 Visual: 1 4 Facial Palsy: 1 5a Motor Arm - left: 1 5b Motor Arm - Right: 0 6a Motor Leg - Left: 3 6b Motor Leg - Right: 3 7 Limb Ataxia: 1 8 Sensory: 0 9 Best Language: 0 10 Dysarthria: 0 11 Extinct. and Inatten.: 0  TOTAL: 62  A/P: 81 year old female with a history of atrial  flutter who is on anticoagulation, but this has been held due to surgery with strokes consistent with emboli in the setting of holding Eliquis.  Her eliquis was restarted today but RN noted her to have BLE weakness that was new and repeat MRI brain today showed new strokes since 5 days ago. Given the size of her L occipital stroke eliquis will need to be held again to reduce the risk of hemorrhagic conversion. Plan to hold anticoagulation for 3 more days (would prefer 5, but given that she keeps embolizing, 3 days seems to be optimal risk/benefit time period). She will need to be rescanned with noncon head CT prior to restarting anticoagulation to r/o interval hemorrhagic conversion. I will see patient again tomorrow and discuss with family.  - Hold eliquis with plan to restart Saturday after noncon head CT r/o interval hemorrhagic conversion - ASA $Rem'81mg'gYcA$  daily while eliquis is held, stop aspirin when eliquis is restarted - Permissive HTN up to 300 systolic T62 hrs, avoid hypotension - STAT head CT for any change in exam - PT/OT/SLP  Su Monks, MD [facility] [phone]  If 7pm- 7am, please page neurology on call as listed in [place].

## 2021-07-22 NOTE — Progress Notes (Signed)
Occupational Therapy Treatment Patient Details Name: Emberly Tomasso MRN: [id number] DOB: 13-Oct-1940 Today's Date: 07/22/2021   History of present illness Clifford Coudriet is a 81 y.o. female with history of atrial fibrillation on Eliquis, CHF, hypertension, hyperlipidemia, diabetes, COPD, chronic kidney disease, lung CA, depression, and recent R carpal tunnel release (Jul 15, 2021), who presents to the emergency department with complaints of vertigo. 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   OT comments  Pt seen for OT treatment on this date. Functional mobility portion of session coordinated with PT to address functional/ADL transfers. Upon arrival to room, pt awake and seated upright in bed eating breakfast with husband at bedside. Pt alert and oriented to self and month; disoriented to year and situation. Pt required MAX A for bed mobility. Once seated EOB, pt only able to maintain static balance with b/l UE supported for ~2 mins before becoming fatigued and requiring MOD-MAX A to maintain static sitting balance. After sitting EOB for ~7 mins, pt attempted x2 sit>stand transfers, requiring MAX A+2 to clear hips from bed. Pt left sitting EOB with PT. Pt continues to benefit from skilled OT services to maximize return to PLOF and minimize risk of future falls, injury, caregiver burden, and readmission. Based on pt's current activity tolerance, discharge recommendation changed to SNF (per chart review, this is also family's preference).    Recommendations for follow up therapy are one component of a multi-disciplinary discharge planning process, led by the attending physician.  Recommendations may be updated based on patient status, additional functional criteria and insurance authorization.    Follow Up Recommendations  Skilled nursing-short term rehab (<3 hours/day)    Assistance Recommended at Discharge Frequent or constant  Supervision/Assistance  Patient can return home with the following  Two people to help with walking and/or transfers;Two people to help with bathing/dressing/bathroom;Help with stairs or ramp for entrance   Equipment Recommendations  Other (comment) (defer to next venue of care)       Precautions / Restrictions Precautions Precautions: Fall Precaution Comments: recent carpal tunnel sx R UE Restrictions Weight Bearing Restrictions: No       Mobility Bed Mobility Overal bed mobility: Needs Assistance Bed Mobility: Supine to Sit     Supine to sit: Max assist, HOB elevated          Transfers Overall transfer level: Needs assistance Equipment used: Rolling walker (2 wheels) Transfers: Sit to/from Stand Sit to Stand: Max assist, +2 physical assistance                 Balance Overall balance assessment: Needs assistance Sitting-balance support: Bilateral upper extremity supported, Feet supported Sitting balance-Leahy Scale: Poor Sitting balance - Comments: Intermittently requires MOD-MAX A for static sitting balance. Only able to maintain static sitting balance for ~2 mins with CGA Postural control: Left lateral lean Standing balance support: Bilateral upper extremity supported, During functional activity Standing balance-Leahy Scale: Zero Standing balance comment: Requires MAX A to maintain static standing balance                           ADL either performed or assessed with clinical judgement   ADL Overall ADL's : Needs assistance/impaired Eating/Feeding: Set up;Sitting  Cognition Arousal/Alertness: Awake/alert Behavior During Therapy: Flat affect Overall Cognitive Status: Impaired/Different from baseline                                 General Comments: Pt oriented to self and month only.                   Pertinent Vitals/ Pain       Pain Assessment Pain  Assessment: Faces Faces Pain Scale: Hurts even more Pain Location: b/l shoulders Pain Descriptors / Indicators: Discomfort, Guarding, Grimacing Pain Intervention(s): Limited activity within patient's tolerance, Monitored during session         Frequency  Min 4X/week        Progress Toward Goals  OT Goals(current goals can now be found in the care plan section)  Progress towards OT goals: Progressing toward goals  Acute Rehab OT Goals Patient Stated Goal: to improve pain OT Goal Formulation: With patient/family Time For Goal Achievement: 07/31/21 Potential to Achieve Goals: Good  Plan Discharge plan needs to be updated;Frequency needs to be updated    Co-evaluation    PT/OT/SLP Co-Evaluation/Treatment: Yes Reason for Co-Treatment: Complexity of the patient's impairments (multi-system involvement) PT goals addressed during session: Mobility/safety with mobility;Balance OT goals addressed during session: ADL's and self-care;Proper use of Adaptive equipment and DME      AM-PAC OT "6 Clicks" Daily Activity     Outcome Measure   Help from another person eating meals?: A Little Help from another person taking care of personal grooming?: A Lot Help from another person toileting, which includes using toliet, bedpan, or urinal?: A Lot Help from another person bathing (including washing, rinsing, drying)?: A Lot Help from another person to put on and taking off regular upper body clothing?: A Little Help from another person to put on and taking off regular lower body clothing?: A Lot 6 Click Score: 14    End of Session Equipment Utilized During Treatment: Gait belt;Rolling walker (2 wheels)  OT Visit Diagnosis: Other abnormalities of gait and mobility (R26.89);Muscle weakness (generalized) (M62.81)   Activity Tolerance Patient limited by pain   Patient Left in bed;with call bell/phone within reach;Other (comment) (with PT)   Nurse Communication Mobility status         Time: 5320-2334 OT Time Calculation (min): 25 min  Charges: OT General Charges $OT Visit: 1 Visit OT Treatments $Self Care/Home Management : 8-22 mins  Fredirick Maudlin, OTR/L [place]

## 2021-07-22 NOTE — TOC Progression Note (Addendum)
Transition of Care [facility]) - Progression Note    Patient Details  Name: Natasha [name] MRN: [id number] Date of Birth: November 17, 1940  Transition of Care [facility]) CM/SW [place], RN Phone Number: 07/22/2021, 9:05 AM  Clinical Narrative:   Patient and family chose [facility] aware.   Addendum 1037hours:  As per Magda Paganini, facility can take patient tomorrow.  Care team made aware.        Expected Discharge Plan and Services                                                 Social Determinants of Health (SDOH) Interventions    Readmission Risk Interventions No flowsheet data found.

## 2021-07-22 NOTE — Progress Notes (Signed)
Patient alert and oriented, complained of pain, PRN pain med given x1. Patient not able to void this shift, bladder scanned 800cc, In and out cath once per hospitalist, will continue to monitor.

## 2021-07-22 NOTE — Evaluation (Signed)
Clinical/Bedside Swallow Evaluation Patient Details  Name: Natasha [name] MRN: [id number] Date of Birth: 1940-06-30  Today's Date: 07/22/2021 Time: SLP Start Time (ACUTE ONLY): 34 SLP Stop Time (ACUTE ONLY): 1215 SLP Time Calculation (min) (ACUTE ONLY): 45 min  Past Medical History:  Past Medical History:  Diagnosis Date   A-fib ([place])    a.) CHA2DS2-VASc Score = 6 (age x 2, sex, HTN, aortic plaque, T2DM). 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   Chronic cough    CKD (chronic kidney disease), stage III (HCC)    Complication of anesthesia    COPD (chronic obstructive pulmonary disease) (HCC)    Depression    Diastolic dysfunction    a.) TTE 04/08/2014: EF 60%; mild concentric LVH; G2DD. b.) TTE 12/21/2019: EF 60-65%, LA mildly dilated, mild-mod MR; PASP 36.8; G1DD. c.) TTE 02/25/2021: EF 60-65%; normal LV function with mild concentric LVH; G1DD   Diverticulitis    Dyspnea    High cholesterol    History of 2019 novel coronavirus disease (COVID-19) 02/24/2021   History of hiatal hernia    History of kidney stones    Hypertension    Insomnia    Long term current use of anticoagulant    a.) apixaban   Lumbar spinal stenosis    Mild asthma    Murmur    Non-small cell carcinoma of left lung, stage 1 ([place]) 09/30/2020   a.) clinical stage 1 (cT1cN0cM0). b.) treated with SBRT (60 cGy over 5 fractions).   OSA on CPAP    Osteoporosis    Restless leg    Sepsis ([place])    T2DM (type 2 diabetes mellitus) ([place])    Past Surgical History:  Past Surgical History:  Procedure Laterality Date   ABDOMINAL HYSTERECTOMY  1978   ANTERIOR  INTEROSSEOUS NERVE DECOMPRESSION Right 07/15/2021   Procedure: Cubital tunnel release;  Surgeon: Hessie Knows, MD;  Location: [facility] ORS;  Service: Orthopedics;  Laterality: Right;   APPENDECTOMY  1978   BREAST BIOPSY Left ?   papilloma   BREAST CYST EXCISION Bilateral yrs ago   benign, scars not well visualized   BREAST SURGERY     CARPAL TUNNEL RELEASE Left 04/22/2021   Procedure: Left carpal tunnel release & ulnar nerve release at elbow;  Surgeon: Hessie Knows, MD;  Location: [facility] ORS;  Service: Orthopedics;  Laterality: Left;   CARPAL TUNNEL RELEASE Right 07/15/2021   Procedure: CARPAL TUNNEL RELEASE;  Surgeon: Hessie Knows, MD;  Location: [facility] ORS;  Service: Orthopedics;  Laterality: Right;   CATARACT EXTRACTION W/ INTRAOCULAR LENS  IMPLANT, BILATERAL Bilateral    CHOLECYSTECTOMY     COLONOSCOPY     ELBOW SURGERY Right    [place] release   EYE SURGERY     FRACTURE SURGERY     GASTRIC BYPASS  12/22/2017   Roux-N-Y   JOINT REPLACEMENT     KNEE SURGERY Left    tibial fracture with metal plate   TOTAL SHOULDER REPLACEMENT Left 2014   ULNAR TUNNEL RELEASE Left 04/22/2021   Procedure: CUBITAL TUNNEL RELEASE;  Surgeon: Hessie Knows, MD;  Location: [facility] ORS;  Service: Orthopedics;  Laterality: Left;   VIDEO BRONCHOSCOPY  WITH ENDOBRONCHIAL NAVIGATION N/A 09/30/2020   Procedure: ROBOTIC ASSISTED VIDEO BRONCHOSCOPY WITH ENDOBRONCHIAL NAVIGATION;  Surgeon: Tyler Pita, MD;  Location: [facility] ORS;  Service: Pulmonary;  Laterality: N/A;   WRIST SURGERY Left    fractures   HPI:  Pt is a 81 y.o. female with a history of atrial flutter on anticoagulation with Eliquis who underwent carpal tunnel release on 07/15/21 and was off her Eliquis.  She had restarted it the day prior to her symptoms, and then around midnight 05/16/22, she awoke with vertigo feelings.  MRI brain-multiple small strokes, with a significant posterior circulation predominance.   PMH includes Multiple dxs including: gastric  bypass in 2019, A Fib on Eliquis (currently on hold), hypertension, hyperlipidemia, diabetes mellitus, COPD, asthma, GERD, depression, former smoker, RLS, OSA, CKD-4, dCHF, lung cancer (s/p of radiation therapy), iron deficiency anemia, chronic pain.    Assessment / Plan / Recommendation  Clinical Impression  Pt appears to present w/ adequate oropharyngeal phase swallow function w/ No oropharyngeal phase dysphagia noted, No neuromuscular deficits noted. Pt consumed po trials w/ No overt, clinical s/s of aspiration during po trials. Pt appears at reduced risk for aspiration following general aspiration precautions and given support at meals w/ tray setup and feeding as needed d/t reduced effort in UEs.   Pt has demonstrated difficulty swallowing Pills w/ NSG (larger Pills) w/ coughing noted by [place] staff. Recommend Pills in Puree, both Crushed and Whole for safer swallowing. Education given to BOTH pt and Husband on Pill swallowing using a Puree in order to be cautious and conservative, especially during a time of illness. Also discussed Positioning and need to sit fully Upright w/ any oral intake, including Pill swallowing. Encouraged pt/Husband to Cut foods/solids smaller vs large chunks of food.  During po trials, pt consumed all consistencies w/ no overt coughing, decline in vocal quality, or change in respiratory presentation during/post trials. Oral phase appeared [facility] w/ timely bolus management, mastication, and control of bolus propulsion for A-P transfer for swallowing. Oral clearing achieved w/ all trial consistencies. OM Exam appeared [facility] w/ no unilateral weakness noted. Speech Clear. Pt fed self w/ setup support and w/ CUTTING OF SOLID FOODS INTO SMALLER PIECES.   Recommend continue a Regular consistency diet w/ well-Cut meats, moistened foods; Thin liquids. Monitor straw use. Recommend general aspiration precautions, Pills Crushed vs Whole in Puree for safer, easier swallowing as pt described  Larger pills causing difficulty to swallow. Education given on Pills in Puree; food consistencies and easy to eat options; general aspiration precautions including positioning to pt and Husband. NSG to reconsult if any new needs arise. NSG agreed.  SLP Visit Diagnosis: Dysphagia, unspecified (R13.10)    Aspiration Risk  Mild aspiration risk;Risk for inadequate nutrition/hydration (d/t deconditioning and in bed eating/drinking)    Diet Recommendation   Regular consistency diet w/ well-Cut meats, moistened foods; Thin liquids. Monitor straw use. Recommend general aspiration precautions including Sitting Upright for all oral intake. Tray setup at meals.   Medication Administration: Crushed with puree (may try Whole as able to tolerate)    Other  Recommendations Recommended Consults:  (Dietician f/u) Oral Care Recommendations: Oral care BID;Oral care before and after PO;Patient independent with oral care (supported) Other Recommendations:  (n/a)    Recommendations for follow up therapy are one component of a multi-disciplinary discharge planning process, led by the attending physician.  Recommendations may be updated based on patient status, additional functional criteria and insurance authorization.  Follow up Recommendations No  SLP follow up      Assistance Recommended at Discharge Intermittent Supervision/Assistance  Functional Status Assessment Patient has had a recent decline in their functional status and demonstrates the ability to make significant improvements in function in a reasonable and predictable amount of time.  Frequency and Duration  (n/a)   (n/a)       Prognosis Prognosis for Safe Diet Advancement: Good Barriers to Reach Goals: Time post onset;Severity of deficits Barriers/Prognosis Comment: difficulty w/ swallowing pills      Swallow Study   General Date of Onset: 07/15/21 HPI: Pt is a 81 y.o. female with a history of atrial flutter on anticoagulation with Eliquis  who underwent carpal tunnel release on 07/15/21 and was off her Eliquis.  She had restarted it the day prior to her symptoms, and then around midnight 05/16/22, she awoke with vertigo feelings.  MRI brain-multiple small strokes, with a significant posterior circulation predominance.   PMH includes Multiple dxs including: gastric bypass in 2019, A Fib on Eliquis (currently on hold), hypertension, hyperlipidemia, diabetes mellitus, COPD, asthma, GERD, depression, former smoker, RLS, OSA, CKD-4, dCHF, lung cancer (s/p of radiation therapy), iron deficiency anemia, chronic pain. Type of Study: Bedside Swallow Evaluation Previous Swallow Assessment: none Diet Prior to this Study: Regular;Thin liquids Temperature Spikes Noted: No Respiratory Status: Room air History of Recent Intubation: No (wbc 12.5) Behavior/Cognition: Alert;Cooperative;Pleasant mood;Requires cueing;Distractible (min) Oral Cavity Assessment: Within Functional Limits Oral Care Completed by SLP: Recent completion by staff Oral Cavity - Dentition: Adequate natural dentition (missing tooth) Vision: Functional for self-feeding Self-Feeding Abilities: Able to feed self;Needs assist;Needs set up (reduced effort in UEs; reduced coordination/strength) Patient Positioning: Upright in bed (needed positioning) Baseline Vocal Quality: Normal;Low vocal intensity (adequate) Volitional Cough: Strong Volitional Swallow: Able to elicit    Oral/Motor/Sensory Function Overall Oral Motor/Sensory Function: Within functional limits   Ice Chips Ice chips: Not tested   Thin Liquid Thin Liquid: Within functional limits Presentation: Cup;Self Fed;Straw (5-6 trials) Other Comments: had been drinking prior to entering room(reclined in bed) w/ no deficits reported by pt/Husband    Nectar Thick Nectar Thick Liquid: Not tested   Honey Thick Honey Thick Liquid: Not tested   Puree Puree: Not tested   Solid     Solid: Within functional limits Presentation:  Self Fed;Spoon (9-10 pieces of fresh fruit, brownie)         Orinda Kenner, MS, CCC-SLP Speech Language Pathologist Rehab Services; [place] [phone] (ascom)  Maisen Klingler 07/22/2021,3:25 PM

## 2021-07-22 NOTE — Progress Notes (Signed)
Dr Jimmye Norman and Dr Quinn Axe made aware that husband reports that pt complaining of double vision-Dr Jimmye Norman assessed pt-pt denies double vision, also made MDs aware that pt NIH score=5 related to pts BLE weakness and ataxia

## 2021-07-23 ENCOUNTER — Ambulatory Visit: Payer: Medicare Other

## 2021-07-23 DIAGNOSIS — E7849 Other hyperlipidemia: Secondary | ICD-10-CM

## 2021-07-23 DIAGNOSIS — E1121 Type 2 diabetes mellitus with diabetic nephropathy: Secondary | ICD-10-CM

## 2021-07-23 DIAGNOSIS — I639 Cerebral infarction, unspecified: Secondary | ICD-10-CM | POA: Diagnosis not present

## 2021-07-23 DIAGNOSIS — I6350 Cerebral infarction due to unspecified occlusion or stenosis of unspecified cerebral artery: Secondary | ICD-10-CM | POA: Diagnosis not present

## 2021-07-23 DIAGNOSIS — C3412 Malignant neoplasm of upper lobe, left bronchus or lung: Secondary | ICD-10-CM | POA: Diagnosis not present

## 2021-07-23 DIAGNOSIS — K769 Liver disease, unspecified: Secondary | ICD-10-CM | POA: Diagnosis not present

## 2021-07-23 DIAGNOSIS — D508 Other iron deficiency anemias: Secondary | ICD-10-CM

## 2021-07-23 DIAGNOSIS — M899 Disorder of bone, unspecified: Secondary | ICD-10-CM | POA: Diagnosis not present

## 2021-07-23 DIAGNOSIS — G5601 Carpal tunnel syndrome, right upper limb: Secondary | ICD-10-CM

## 2021-07-23 DIAGNOSIS — J438 Other emphysema: Secondary | ICD-10-CM

## 2021-07-23 DIAGNOSIS — D649 Anemia, unspecified: Secondary | ICD-10-CM | POA: Diagnosis not present

## 2021-07-23 DIAGNOSIS — D696 Thrombocytopenia, unspecified: Secondary | ICD-10-CM

## 2021-07-23 LAB — COMPREHENSIVE METABOLIC PANEL
ALT: 35 U/L (ref 0–44)
AST: 86 U/L — ABNORMAL HIGH (ref 15–41)
Albumin: 2.3 g/dL — ABNORMAL LOW (ref 3.5–5.0)
Alkaline Phosphatase: 227 U/L — ABNORMAL HIGH (ref 38–126)
Anion gap: 11 (ref 5–15)
BUN: 70 mg/dL — ABNORMAL HIGH (ref 8–23)
CO2: 19 mmol/L — ABNORMAL LOW (ref 22–32)
Calcium: 8 mg/dL — ABNORMAL LOW (ref 8.9–10.3)
Chloride: 104 mmol/L (ref 98–111)
Creatinine, Ser: 2.08 mg/dL — ABNORMAL HIGH (ref 0.44–1.00)
GFR, Estimated: 24 mL/min — ABNORMAL LOW (ref >60)
Glucose, Bld: 224 mg/dL — ABNORMAL HIGH (ref 70–99)
Potassium: 4.1 mmol/L (ref 3.5–5.1)
Sodium: 134 mmol/L — ABNORMAL LOW (ref 135–145)
Total Bilirubin: 0.6 mg/dL (ref 0.3–1.2)
Total Protein: 5 g/dL — ABNORMAL LOW (ref 6.5–8.1)

## 2021-07-23 LAB — CBC
HCT: 29.9 % — ABNORMAL LOW (ref 36.0–46.0)
Hemoglobin: 9.4 g/dL — ABNORMAL LOW (ref 12.0–15.0)
MCH: 27.6 pg (ref 26.0–34.0)
MCHC: 31.4 g/dL (ref 30.0–36.0)
MCV: 87.9 fL (ref 80.0–100.0)
Platelets: 70 10*3/uL — ABNORMAL LOW (ref 150–400)
RBC: 3.4 MIL/uL — ABNORMAL LOW (ref 3.87–5.11)
RDW: 16.9 % — ABNORMAL HIGH (ref 11.5–15.5)
WBC: 10.7 10*3/uL — ABNORMAL HIGH (ref 4.0–10.5)
nRBC: 0 % (ref 0.0–0.2)

## 2021-07-23 LAB — URINALYSIS, ROUTINE W REFLEX MICROSCOPIC
Bilirubin Urine: NEGATIVE
Glucose, UA: >=500 mg/dL — AB
Hgb urine dipstick: NEGATIVE
Ketones, ur: NEGATIVE mg/dL
Nitrite: NEGATIVE
Protein, ur: NEGATIVE mg/dL
Specific Gravity, Urine: 1.018 (ref 1.005–1.030)
pH: 5 (ref 5.0–8.0)

## 2021-07-23 LAB — SURGICAL PATHOLOGY

## 2021-07-23 LAB — GLUCOSE, CAPILLARY
Glucose-Capillary: 260 mg/dL — ABNORMAL HIGH (ref 70–99)
Glucose-Capillary: 216 mg/dL — ABNORMAL HIGH (ref 70–99)

## 2021-07-23 MED ORDER — INSULIN GLARGINE-YFGN 100 UNIT/ML ~~LOC~~ SOLN
10.0000 [IU] | Freq: Every day | SUBCUTANEOUS | Status: DC
  Administered 2021-07-23: 10 [IU] via SUBCUTANEOUS
  Filled 2021-07-23 (×2): qty 0.1

## 2021-07-23 MED ORDER — ASPIRIN EC 81 MG PO TBEC
81.0000 mg | DELAYED_RELEASE_TABLET | Freq: Every day | ORAL | Status: DC
  Administered 2021-07-24 – 2021-07-25 (×2): 81 mg via ORAL
  Filled 2021-07-23 (×2): qty 1

## 2021-07-23 MED ORDER — AMIODARONE HCL 200 MG PO TABS
200.0000 mg | ORAL_TABLET | Freq: Every day | ORAL | Status: DC
  Administered 2021-07-23 – 2021-07-31 (×9): 200 mg via ORAL
  Filled 2021-07-23 (×9): qty 1

## 2021-07-23 NOTE — Plan of Care (Signed)
?Problem: Education: ?Goal: Knowledge of General Education information will improve ?Description: Including pain rating scale, medication(s)/side effects and non-pharmacologic comfort measures ?07/23/2021 1051 by Tyron Manetta, Camillo Flaming, RN ?Outcome: Progressing ?07/23/2021 1051 by Cherrish Vitali, Camillo Flaming, RN ?Outcome: Progressing ?  ?Problem: Health Behavior/Discharge Planning: ?Goal: Ability to manage health-related needs will improve ?07/23/2021 1051 by Elisavet Buehrer, Camillo Flaming, RN ?Outcome: Progressing ?07/23/2021 1051 by Kynzley Dowson, Camillo Flaming, RN ?Outcome: Progressing ?  ?Problem: Clinical Measurements: ?Goal: Ability to maintain clinical measurements within normal limits will improve ?07/23/2021 1051 by Grayland Daisey, Camillo Flaming, RN ?Outcome: Progressing ?07/23/2021 1051 by Nanea Jared, Camillo Flaming, RN ?Outcome: Progressing ?Goal: Will remain free from infection ?07/23/2021 1051 by Ilianna Bown, Camillo Flaming, RN ?Outcome: Progressing ?07/23/2021 1051 by Morey Andonian, Camillo Flaming, RN ?Outcome: Progressing ?Goal: Diagnostic test results will improve ?07/23/2021 1051 by Lucious Zou, Camillo Flaming, RN ?Outcome: Progressing ?07/23/2021 1051 by Lazaria Schaben, Camillo Flaming, RN ?Outcome: Progressing ?Goal: Respiratory complications will improve ?07/23/2021 1051 by Haze Antillon, Camillo Flaming, RN ?Outcome: Progressing ?07/23/2021 1051 by Martine Bleecker, Camillo Flaming, RN ?Outcome: Progressing ?Goal: Cardiovascular complication will be avoided ?07/23/2021 1051 by Tawonna Esquer, Camillo Flaming, RN ?Outcome: Progressing ?07/23/2021 1051 by Candice Lunney, Camillo Flaming, RN ?Outcome: Progressing ?  ?Problem: Activity: ?Goal: Risk for activity intolerance will decrease ?07/23/2021 1051 by Cederic Mozley, Camillo Flaming, RN ?Outcome: Progressing ?07/23/2021 1051 by Carmel Waddington, Camillo Flaming, RN ?Outcome: Progressing ?  ?Problem: Nutrition: ?Goal: Adequate nutrition will be maintained ?07/23/2021 1051 by Madicyn Mesina, Camillo Flaming, RN ?Outcome: Progressing ?07/23/2021 1051 by Haden Suder, Camillo Flaming, RN ?Outcome: Progressing ?  ?Problem: Coping: ?Goal: Level of anxiety will decrease ?07/23/2021  1051 by Kemesha Mosey, Camillo Flaming, RN ?Outcome: Progressing ?07/23/2021 1051 by Kehinde Totzke, Camillo Flaming, RN ?Outcome: Progressing ?  ?Problem: Elimination: ?Goal: Will not experience complications related to bowel motility ?07/23/2021 1051 by Keyly Baldonado, Camillo Flaming, RN ?Outcome: Progressing ?07/23/2021 1051 by Manasi Dishon, Camillo Flaming, RN ?Outcome: Progressing ?Goal: Will not experience complications related to urinary retention ?07/23/2021 1051 by Brayden Brodhead, Camillo Flaming, RN ?Outcome: Progressing ?07/23/2021 1051 by Remus Hagedorn, Camillo Flaming, RN ?Outcome: Progressing ?  ?Problem: Pain Managment: ?Goal: General experience of comfort will improve ?07/23/2021 1051 by Rainna Nearhood, Camillo Flaming, RN ?Outcome: Progressing ?07/23/2021 1051 by Sandrea Boer, Camillo Flaming, RN ?Outcome: Progressing ?  ?Problem: Safety: ?Goal: Ability to remain free from injury will improve ?07/23/2021 1051 by Ayde Record, Camillo Flaming, RN ?Outcome: Progressing ?07/23/2021 1051 by Obadiah Dennard, Camillo Flaming, RN ?Outcome: Progressing ?  ?Problem: Skin Integrity: ?Goal: Risk for impaired skin integrity will decrease ?07/23/2021 1051 by Kaylany Tesoriero, Camillo Flaming, RN ?Outcome: Progressing ?07/23/2021 1051 by Nikkolas Coomes, Camillo Flaming, RN ?Outcome: Progressing ?  ?Problem: Education: ?Goal: Knowledge of disease or condition will improve ?07/23/2021 1051 by Juventino Pavone, Camillo Flaming, RN ?Outcome: Progressing ?07/23/2021 1051 by Milinda Sweeney, Camillo Flaming, RN ?Outcome: Progressing ?Goal: Knowledge of secondary prevention will improve (SELECT ALL) ?07/23/2021 1051 by Beryl Balz, Camillo Flaming, RN ?Outcome: Progressing ?07/23/2021 1051 by Poet Hineman, Camillo Flaming, RN ?Outcome: Progressing ?Goal: Knowledge of patient specific risk factors will improve (INDIVIDUALIZE FOR PATIENT) ?07/23/2021 1051 by Keyonda Bickle, Camillo Flaming, RN ?Outcome: Progressing ?07/23/2021 1051 by Timmothy Baranowski, Camillo Flaming, RN ?Outcome: Progressing ?  ?Problem: Coping: ?Goal: Will identify appropriate support needs ?07/23/2021 1051 by Catelynn Sparger, Camillo Flaming, RN ?Outcome: Progressing ?07/23/2021 1051 by Jennye Runquist, Camillo Flaming, RN ?Outcome:  Progressing ?  ?Problem: Health Behavior/Discharge Planning: ?Goal: Ability to manage health-related needs will improve ?07/23/2021 1051 by Javen Ridings, Camillo Flaming, RN ?Outcome: Progressing ?07/23/2021 1051 by Bronislaus Verdell, Camillo Flaming, RN ?Outcome: Progressing ?  ?Problem: Self-Care: ?Goal: Ability to participate in self-care as condition permits will improve ?07/23/2021  1051 by Maicey Barrientez, Camillo Flaming, RN ?Outcome: Progressing ?07/23/2021 1051 by Liela Rylee, Camillo Flaming, RN ?Outcome: Progressing ?Goal: Ability to communicate needs accurately will improve ?07/23/2021 1051 by Selicia Windom, Camillo Flaming, RN ?Outcome: Progressing ?07/23/2021 1051 by Chanceler Pullin, Camillo Flaming, RN ?Outcome: Progressing ?  ?Problem: Nutrition: ?Goal: Risk of aspiration will decrease ?07/23/2021 1051 by Antonia Jicha, Camillo Flaming, RN ?Outcome: Progressing ?07/23/2021 1051 by Malikah Lakey, Camillo Flaming, RN ?Outcome: Progressing ?  ?Problem: Ischemic Stroke/TIA Tissue Perfusion: ?Goal: Complications of ischemic stroke/TIA will be minimized ?07/23/2021 1051 by Ervin Hensley, Camillo Flaming, RN ?Outcome: Progressing ?07/23/2021 1051 by Delancey Moraes, Camillo Flaming, RN ?Outcome: Progressing ?  ?

## 2021-07-23 NOTE — TOC Progression Note (Signed)
Transition of Care (TOC) - Progression Note  ? ? ?Patient Details  ?Name: Natasha [name] ?MRN: [id number] ?Date of Birth: 02/06/1941 ? ?Transition of Care (TOC) CM/SW Contact  ?Pete Pelt, RN ?Phone Number: ?07/23/2021, 9:47 AM ? ?Clinical Narrative:   Patient requires continued medical workup.  As per hospitalist, patient will not be ready for discharge until Saturday at the earliest.  [facility]) made aware, states they do not take patients on weekends.  TOC will continue to follow. ? ? ? ?  ?  ? ?Expected Discharge Plan and Services ?  ?  ?  ?  ?  ?                ?  ?  ?  ?  ?  ?  ?  ?  ?  ?  ? ? ?Social Determinants of Health (SDOH) Interventions ?  ? ?Readmission Risk Interventions ?No flowsheet data found. ? ?

## 2021-07-23 NOTE — Progress Notes (Signed)
Hematology/Oncology Progress note [phone] [phone]     Patient Care Team: Lesleigh Noe, MD as PCP - General (Family Medicine) Kate Sable, MD as PCP - Cardiology (Cardiology) Telford Nab, RN as Oncology Nurse Navigator   Name of the patient: Natasha [name]  [id number]  January 05, 1941  Date of visit: 07/23/21   INTERVAL HISTORY-   #07/21/2021, left cervical lymph node biopsy pathology is positive for malignancy, metastatic adenocarcinoma, compatible with lung primary.  Neurology was reconsulted as patient has developed a new weakness of bilateral lower extremity and diplopia.  07/22/2021 MRI brain without contrast showed multiple new embolic strokes since last MRI 5 days ago.   Neurology recommended to hold off anticoagulation for 3 days, aspirin bridging to anticoagulation.   Husband and grandson Ovid Curd are at bedside today. Patient reports that diplopia has improved. +weakness.  Allergies  Allergen Reactions   Atorvastatin     Muscle/joint aches   Lantus [Insulin Glargine] Hives   Lyrica [Pregabalin] Other (See Comments)    Headache, disorientation   Lisinopril Hives and Cough         Patient Active Problem List   Diagnosis Date Noted   CVA (cerebral vascular accident) ([place]) 07/18/2021   Acute ischemic stroke ([place]) 07/17/2021   HLD (hyperlipidemia) 07/17/2021   Type II diabetes mellitus with renal manifestations ([place]) 07/17/2021   COPD (chronic obstructive pulmonary disease) ([place]) 07/17/2021   CKD (chronic kidney disease), stage IV (HCC) 07/17/2021   Chronic diastolic CHF (congestive heart failure) ([place]) 07/17/2021   Leukocytosis 07/17/2021   Bone lesion 29/19/1660   Acute embolic stroke ([place]) 60/08/5995   Lung cancer ([place]) 07/17/2021   Carpal tunnel syndrome on right 07/17/2021   Lesion of liver    Vitamin B12 deficiency 07/14/2021   Localized swelling, mass and lump, neck 07/14/2021   Elevated sed rate 07/14/2021   Vitamin D  deficiency 07/14/2021   CKD (chronic kidney disease) stage 4, GFR 15-29 ml/min (HCC) 06/24/2021   Polyarthralgia 06/16/2021   Muscle weakness 06/16/2021   Hyperkalemia 06/16/2021   Unsteadiness on feet 06/16/2021   Acute leg pain, right 06/16/2021   Double vision 03/13/2021   Dizziness 03/13/2021   COVID-19 virus infection 02/24/2021   Atrial flutter ([place]) 02/24/2021   Chronic bilateral low back pain with bilateral sciatica 02/17/2021   Decreased sensation 11/27/2020   Dermatitis 11/27/2020   Axillary lymphadenopathy 10/11/2020   Cancer of upper lobe of left lung ([place]) 10/11/2020   Goals of care, counseling/discussion 10/11/2020   Status post reverse total shoulder replacement, 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   Diverticulitis of colon 02/03/2020   Chronic pain syndrome 01/12/2019   Neuropathic pain 01/12/2019   At high risk for falls 11/02/2018   Neuroforaminal stenosis of lumbar spine 11/02/2018   Sacroiliitis ([place]) 11/02/2018   Former smoker 09/23/2018   Spinal stenosis of lumbar region without neurogenic claudication 09/23/2018   Weakness of both hands 09/23/2018   Overweight (BMI 25.0-29.9) 08/10/2018   Insomnia 06/17/2018   Chronic kidney disease with symptom management only, stage 3 (moderate) ([place]) 05/27/2018   Nonrheumatic aortic valve stenosis 05/24/2018   Iron deficiency anemia 12/24/2017   Paroxysmal atrial fibrillation ([place]) 12/24/2017   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/24/2017   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/2017   Gastroesophageal reflux disease 11/17/2016   OSA (obstructive sleep apnea) 07/24/2016   Diverticulosis 07/21/2016   Osteopenia of multiple sites 06/04/2016   Closed compression fracture of thoracic vertebra ([place]) 04/02/2015   Mixed hyperlipidemia 04/02/2015   Restless  leg syndrome 04/02/2015   Closed fracture of lateral portion of left tibial plateau 04/06/2014   Essential hypertension 04/06/2014   Mild intermittent asthma without complication 53/66/4403    Type 2 diabetes mellitus with other specified complication ([place]) 47/42/5956     Past Medical History:  Diagnosis Date   A-fib ([place])    a.) CHA2DS2-VASc Score = 6 (age x 2, sex, HTN, aortic plaque, T2DM). 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   Chronic cough    CKD (chronic kidney disease), stage III (HCC)    Complication of anesthesia    COPD (chronic obstructive pulmonary disease) (HCC)    Depression    Diastolic dysfunction    a.) TTE 04/08/2014: EF 60%; mild concentric LVH; G2DD. b.) TTE 12/21/2019: EF 60-65%, LA mildly dilated, mild-mod MR; PASP 36.8; G1DD. c.) TTE 02/25/2021: EF 60-65%; normal LV function with mild concentric LVH; G1DD   Diverticulitis    Dyspnea    High cholesterol    History of 2019 novel coronavirus disease (COVID-19) 02/24/2021   History of hiatal hernia    History of kidney stones    Hypertension    Insomnia    Long term current use of anticoagulant    a.) apixaban   Lumbar spinal stenosis    Mild asthma    Murmur    Non-small cell carcinoma of left lung, stage 1 ([place]) 09/30/2020   a.) clinical stage 1 (cT1cN0cM0). b.) treated with SBRT (60 cGy over 5 fractions).   OSA on CPAP    Osteoporosis    Restless leg    Sepsis ([place])    T2DM (type 2 diabetes mellitus) ([place])      Past Surgical History:  Procedure Laterality Date   ABDOMINAL HYSTERECTOMY  1978   ANTERIOR INTEROSSEOUS NERVE DECOMPRESSION Right 07/15/2021   Procedure: Cubital tunnel release;  Surgeon: Hessie Knows, MD;  Location: [facility] ORS;  Service: Orthopedics;  Laterality: Right;   APPENDECTOMY  1978   BREAST BIOPSY Left ?   papilloma    BREAST CYST EXCISION Bilateral yrs ago   benign, scars not well visualized   BREAST SURGERY     CARPAL TUNNEL RELEASE Left 04/22/2021   Procedure: Left carpal tunnel release & ulnar nerve release at elbow;  Surgeon: Hessie Knows, MD;  Location: [facility] OR[facility];  Service: Orthopedics;  Laterality: Left;   CARPAL TUNNEL RELEASE Right 07/15/2021   Procedure: CARPAL TUNNEL RELEASE;  Surgeon: Hessie Knows, MD;  Location: [facility] OR[facility];  Service: Orthopedics;  Laterality: Right;   CATARACT EXTRACTION W/ INTRAOCULAR LENS  IMPLANT, BILATERAL Bilateral    CHOLECYSTECTOMY     COLONOSCOPY     ELBOW SURGERY Right    [place] release   EYE SURGERY     FRACTURE SURGERY     GASTRIC BYPASS  12/22/2017   [place]   JOINT REPLACEMENT     KNEE SURGERY Left    tibial fracture with metal plate   TOTAL SHOULDER REPLACEMENT Left 2014   ULNAR TUNNEL RELEASE Left 04/22/2021   Procedure: CUBITAL TUNNEL RELEASE;  Surgeon: Hessie Knows, MD;  Location: [facility];  Service: Orthopedics;  Laterality: Left;   VIDEO BRONCHOSCOPY WITH ENDOBRONCHIAL  NAVIGATION N/A 09/30/2020   Procedure: ROBOTIC ASSISTED VIDEO BRONCHOSCOPY WITH ENDOBRONCHIAL NAVIGATION;  Surgeon: Tyler Pita, MD;  Location: [facility];  Service: Pulmonary;  Laterality: N/A;   WRIST SURGERY Left    fractures    Social History   Socioeconomic History   Marital status: Married    Spouse name: Scientist, physiological   Number of children: 1   Years of education: some college   Highest education level: Not on file  Occupational History   Not on file  Tobacco Use   Smoking status: Former    Packs/day: 1.00    Years: 12.00    Pack years: 12.00    Types: Cigarettes    Quit date: 05/26/1975    Years since quitting: 46.1    Passive exposure: Past   Smokeless tobacco: Never  Vaping Use   Vaping Use: Never used  Substance and Sexual Activity   Alcohol use: No    Comment: rarely   Drug use: Never   Sexual activity: Not Currently  Other Topics Concern   Not on file   Social History Narrative   07/22/20   [place]   Living: with husband, Scientist, physiological [phone])   Work: retired - high end Journalist, newspaper      Family: grandson - Ovid Curd Jul 08, 1998) (son is deceased) - and living with them      Enjoys: Enjoys Social worker, going to art shows, gardening and painting      Exercise: not currently   Diet: does not follow diabetic diet      Safety   Seat belts: Yes    Guns: Yes  and secure   Safe in relationships: Yes    Social Determinants of Health   Financial Resource Strain: Not on file  Food Insecurity: Not on file  Transportation Needs: Not on file  Physical Activity: Not on file  Stress: Not on file  Social Connections: Not on file  Intimate Partner Violence: Not on file     Family History  Problem Relation Age of Onset   Other Mother        died from surgery   AAA (abdominal aortic aneurysm) Mother    Diabetes Father        controlled by diet   Dementia Father        brain atrophy - unknown origin   Breast cancer Cousin        maternal     Current Facility-Administered Medications:     stroke: mapping our early stages of recovery book, , Does not apply, Once, Howerter, Justin B, DO   0.9 %  sodium chloride infusion, , Intravenous, Continuous, Richarda Osmond, MD, Last Rate: 75 mL/hr at 07/23/21 1200, New Bag at 07/23/21 1200   acetaminophen (TYLENOL) tablet 650 mg, 650 mg, Oral, Q6H PRN, Ivor Costa, MD, 650 mg at 07/20/21 1648   amiodarone (PACERONE) tablet 200 mg, 200 mg, Oral, Daily, Doristine Mango L, MD, 200 mg at 07/23/21 1157   [START ON 07/24/2021] aspirin EC tablet 81 mg, 81 mg, Oral, Daily, Richarda Osmond, MD   calcium-vitamin D (OSCAL WITH D) 500-5 MG-MCG per tablet 1 tablet, 1 tablet, Oral, Daily, Ivor Costa, MD, 1 tablet at 07/23/21 0949   chlorhexidine (PERIDEX) 0.12 % solution 5 mL, 5 mL, Mouth/Throat, BID, Ivor Costa, MD, 5 mL at 07/23/21 0950   dextromethorphan-guaiFENesin  (MUCINEX DM) 30-600 MG per 12 hr tablet 1 tablet, 1 tablet, Oral,  BID PRN, Ivor Costa, MD   ferrous gluconate [facility]) tablet 324 mg, 324 mg, Oral, q AM, Ivor Costa, MD, 324 mg at 07/23/21 0948   gabapentin (NEURONTIN) capsule 300 mg, 300 mg, Oral, QHS, Ivor Costa, MD, 300 mg at 07/22/21 2041   HYDROcodone-acetaminophen (NORCO) 7.5-325 MG per tablet 1 tablet, 1 tablet, Oral, Q6H PRN, Ivor Costa, MD, 1 tablet at 07/21/21 2351   insulin aspart (novoLOG) injection 0-9 Units, 0-9 Units, Subcutaneous, TID WC, Ivor Costa, MD, 5 Units at 07/23/21 1242   insulin glargine-yfgn (SEMGLEE) injection 10 Units, 10 Units, Subcutaneous, Daily, Richarda Osmond, MD, 10 Units at 07/23/21 0949   levalbuterol (XOPENEX) nebulizer solution 1.25 mg, 1.25 mg, Inhalation, Q8H PRN, Ivor Costa, MD   ondansetron (ZOFRAN) injection 4 mg, 4 mg, Intravenous, Q8H PRN, Ivor Costa, MD   polyethylene glycol (MIRALAX / GLYCOLAX) packet 17 g, 17 g, Oral, Daily, Wyvonnia Dusky, MD, 17 g at 07/23/21 0948   pramipexole (MIRAPEX) tablet 1 mg, 1 mg, Oral, QHS, Ivor Costa, MD, 1 mg at 07/22/21 2041   rosuvastatin (CRESTOR) tablet 10 mg, 10 mg, Oral, QHS, Wyvonnia Dusky, MD, 10 mg at 07/22/21 2041   sertraline (ZOLOFT) tablet 50 mg, 50 mg, Oral, QHS, Ivor Costa, MD, 50 mg at 07/22/21 2041   Physical exam:  Vitals:   07/23/21 0519 07/23/21 0835 07/23/21 1110 07/23/21 1544  BP:  (!) 118/55 (!) 120/50 (!) 101/38  Pulse:  77 74 71  Resp:  _0 Temp:  97.7 F (36.5 C) 97.8 F (36.6 C) 97.7 F (36.5 C)  TempSrc:  Oral Oral Oral  SpO2:  95% 94% 100%  Weight: 139 lb 1.8 oz (63.1 kg)     Height:       Physical Exam HENT:     Head: Normocephalic and atraumatic.  Cardiovascular:     Rate and Rhythm: Normal rate.  Pulmonary:     Effort: No respiratory distress.  Abdominal:     General: Abdomen is flat. There is no distension.     Palpations: Abdomen is soft.  Musculoskeletal:        General: Normal range of  motion.     Cervical back: Normal range of motion.  Skin:    General: Skin is warm and dry.  Neurological:     Mental Status: She is alert and oriented to person, place, and time.     Comments: Decreased strength of LUE, and bilateral lower extremities  Psychiatric:        Mood and Affect: Mood normal.       CMP Latest Ref Rng & Units 07/23/2021  Glucose 70 - 99 mg/dL 224(H)  BUN 8 - 23 mg/dL 70(H)  Creatinine 0.44 - 1.00 mg/dL 2.08(H)  Sodium 135 - 145 mmol/L 134(L)  Potassium 3.5 - 5.1 mmol/L 4.1  Chloride 98 - 111 mmol/L 104  CO2 22 - 32 mmol/L 19(L)  Calcium 8.9 - 10.3 mg/dL 8.0(L)  Total Protein 6.5 - 8.1 g/dL 5.0(L)  Total Bilirubin 0.3 - 1.2 mg/dL 0.6  Alkaline Phos 38 - 126 U/L 227(H)  AST 15 - 41 U/L 86(H)  ALT 0 - 44 U/L 35   CBC Latest Ref Rng & Units 07/23/2021  WBC 4.0 - 10.5 K/uL 10.7(H)  Hemoglobin 12.0 - 15.0 g/dL 9.4(L)  Hematocrit 36.0 - 46.0 % 29.9(L)  Platelets 150 - 400 K/uL 70(L)    RADIOGRAPHIC STUDIES: I have personally reviewed the radiological images as listed and agreed  with the findings in the report. CT Head Wo Contrast  Result Date: 07/17/2021 CLINICAL DATA:  Nonspecific dizziness EXAM: CT HEAD WITHOUT CONTRAST TECHNIQUE: Contiguous axial images were obtained from the base of the skull through the vertex without intravenous contrast. RADIATION DOSE REDUCTION: This exam was performed according to the departmental dose-optimization program which includes automated exposure control, adjustment of the mA and/or kV according to patient size and/or use of iterative reconstruction technique. COMPARISON:  03/09/2021 FINDINGS: Brain: 2 small right cerebellar infarcts not seen on prior, possibly recent and symptomatic. Few remote supratentorial white matter insults. No hemorrhage, hydrocephalus, or collection. Age normal brain volume. Vascular: No hyperdense vessel or unexpected calcification. Skull: Normal. Negative for fracture or focal lesion. Sinuses/Orbits:  No acute finding. 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 Electronically Signed   By: Jorje Guild M.D.   On: 07/17/2021 04:32   MR ANGIO HEAD WO CONTRAST  Result Date: 07/17/2021 CLINICAL DATA:  Dizziness, infarcts on MRI EXAM: MRA NECK WITHOUT CONTRAST MRA HEAD WITHOUT CONTRAST TECHNIQUE: Angiographic images of the Circle of Willis were acquired using MRA technique without intravenous contrast. COMPARISON:  None. FINDINGS: MRA NECK FINDINGS Standard aortic branching. Common, internal, and external carotid arteries are patent, without hemodynamically significant stenosis. Extracranial vertebral arteries are patent, without hemodynamically significant stenosis, although imaging of the origins is somewhat limited by artifact. MRA HEAD FINDINGS Both internal carotid arteries are patent to the termini, without significant stenosis. A1 segments patent. Normal anterior communicating artery. Anterior cerebral arteries are patent to their distal aspects. No M1 stenosis or occlusion. Normal MCA bifurcations. Distal MCA branches perfused and symmetric. Vertebral arteries patent to the vertebrobasilar junction without stenosis. Basilar patent to its distal aspect. Superior cerebellar arteries patent bilaterally. Patent P1 segments, diminutive on the right. Near fetal origin of the right PCA with patent right posterior communicating artery. PCAs perfused to their distal aspects without stenosis. Possible diminutive left posterior communicating artery. IMPRESSION: 1.  No intracranial large vessel occlusion or significant stenosis. 2.  No hemodynamically significant stenosis in the neck. Electronically Signed   By: Merilyn Baba M.D.   On: 07/17/2021 23:08   MR ANGIO NECK WO CONTRAST  Result Date: 07/17/2021 CLINICAL DATA:  Dizziness, infarcts on MRI EXAM: MRA NECK WITHOUT CONTRAST MRA HEAD WITHOUT CONTRAST TECHNIQUE: Angiographic images of  the Circle of Willis were acquired using MRA technique without intravenous contrast. COMPARISON:  None. FINDINGS: MRA NECK FINDINGS Standard aortic branching. Common, internal, and external carotid arteries are patent, without hemodynamically significant stenosis. Extracranial vertebral arteries are patent, without hemodynamically significant stenosis, although imaging of the origins is somewhat limited by artifact. MRA HEAD FINDINGS Both internal carotid arteries are patent to the termini, without significant stenosis. A1 segments patent. Normal anterior communicating artery. Anterior cerebral arteries are patent to their distal aspects. No M1 stenosis or occlusion. Normal MCA bifurcations. Distal MCA branches perfused and symmetric. Vertebral arteries patent to the vertebrobasilar junction without stenosis. Basilar patent to its distal aspect. Superior cerebellar arteries patent bilaterally. Patent P1 segments, diminutive on the right. Near fetal origin of the right PCA with patent right posterior communicating artery. PCAs perfused to their distal aspects without stenosis. Possible diminutive left posterior communicating artery. IMPRESSION: 1.  No intracranial large vessel occlusion or significant stenosis. 2.  No hemodynamically significant stenosis in the neck. Electronically Signed   By: Merilyn Baba M.D.   On: 07/17/2021 23:08   MR BRAIN WO  CONTRAST  Result Date: 07/22/2021 CLINICAL DATA:  Acute neuro deficit. Stroke. Now with bilateral leg weakness and diplopia. EXAM: MRI HEAD WITHOUT CONTRAST TECHNIQUE: Multiplanar, multiecho pulse sequences of the brain and surrounding structures were obtained without intravenous contrast. COMPARISON:  MRI head 07/17/2021 FINDINGS: Brain: Numerous areas of acute infarct are present compatible with emboli. Multiple small infarcts in the cerebellum bilaterally are stable. Progressive acute infarct in the left occipital pole with mild associated petechial hemorrhage  which was seen previously. Multiple small areas of acute infarct in the frontal and parietal lobes bilaterally and in the white matter. Progression of cluster of acute infarcts in the right parietal white matter and also in the right medial parietal cortex. Small acute infarct head of caudate on the left unchanged. Small acute infarct head of caudate on the right is new. Ventricle size normal.  No mass or midline shift. Vascular: Normal arterial flow voids. Skull and upper cervical spine: Bilateral calvarial lesions are again noted. These are suspicious for metastatic disease. Sinuses/Orbits: Paranasal sinuses clear. Bilateral cataract extraction Other: None IMPRESSION: Numerous areas of acute infarct in the cerebrum and cerebellum bilaterally compatible with acute embolic infarction. There has been progression of acute infarcts since the recent MRI of 07/17/2021. Findings suggest recurrent emboli. Progression of infarct left occipital pole. Petechial hemorrhage in this area unchanged from the prior study. No other hemorrhage. Lesions in the calvarium bilaterally, suspicious for metastatic disease. Electronically Signed   By: Franchot Gallo M.D.   On: 07/22/2021 14:59   MR BRAIN WO CONTRAST  Result Date: 07/17/2021 CLINICAL DATA:  Nonspecific dizziness. EXAM: MRI HEAD WITHOUT CONTRAST TECHNIQUE: Multiplanar, multiecho pulse sequences of the brain and surrounding structures were obtained without intravenous contrast. COMPARISON:  Head CT from earlier today FINDINGS: Brain: Patchy acute infarcts in the bilateral cerebellum and bilateral frontal, parietal, and occipital convexities. Patchy acute infarct in the right more than left centrum semiovale and in the left caudate head. Mild petechial hemorrhage at the right occipital cortex. No hematoma, hydrocephalus, or collection. Vascular: Normal flow voids Skull and upper cervical spine: New scattered bone lesions in the C2 right articular process, right para median  clivus tip, and in the bilateral calvarium, affected areas marked on sagittal T2 weighted imaging. Sinuses/Orbits: Negative IMPRESSION: 1. Numerous small acute infarcts scattered in the brain and compatible with central embolic disease. 2. 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 Recommend metastatic workup. Electronically Signed   By: Jorje Guild M.D.   On: 07/17/2021 05:42   MR Knee Right w/o contrast  Result Date: 06/29/2021 CLINICAL DATA:  Right knee pain for 2 weeks EXAM: MRI OF THE RIGHT KNEE WITHOUT CONTRAST TECHNIQUE: Multiplanar, multisequence MR imaging of the knee was performed. No intravenous contrast was administered. COMPARISON:  None. FINDINGS: MENISCI Medial: Degeneration of the posterior horn of the medial meniscus. No discrete tear. Lateral: Degeneration of the body of the lateral meniscus with a linear component which does not extend to the articular surface. No discrete tear extending to the articular surface. LIGAMENTS Cruciates: ACL and PCL are intact. Collaterals: Medial collateral ligament is intact. Lateral collateral ligament complex is intact. CARTILAGE Patellofemoral: Partial-thickness cartilage loss with areas of full-thickness cartilage loss of the lateral patellar facet with subchondral reactive marrow changes. Partial-thickness cartilage loss of the medial patellofemoral compartment. Medial:  No chondral defect. Lateral: Partial-thickness cartilage loss of the weight-bearing surface of the lateral femoral condyle. JOINT: No joint effusion. Normal Hoffa's fat-pad. No plical thickening. POPLITEAL FOSSA:  Popliteus tendon is intact. No Baker's cyst. EXTENSOR MECHANISM: Intact quadriceps tendon. Intact patellar tendon. Intact lateral patellar retinaculum. Intact medial patellar retinaculum. Intact MPFL. BONES: No aggressive osseous lesion. No fracture or dislocation. Other: No fluid collection or hematoma. Muscles are normal. IMPRESSION: 1. Partial-thickness  cartilage loss with areas of full-thickness cartilage loss of the lateral patellar facet with subchondral reactive marrow changes. Partial-thickness cartilage loss of the medial patellofemoral compartment. 2. Partial-thickness cartilage loss of the weight-bearing surface of the lateral femoral condyle. 3. Degeneration of the body of the lateral meniscus with a linear component which does not extend to the articular surface. No discrete meniscal tear extending to the articular surface. Electronically Signed   By: Kathreen Devoid M.D.   On: 06/29/2021 10:09   ECHOCARDIOGRAM COMPLETE  Result Date: 07/17/2021    ECHOCARDIOGRAM REPORT   Patient Name:   [facility] Date of Exam: 07/17/2021 Medical Rec #:  [id number]     Height:       64.0 in Accession #:    [id number]    Weight:       137.0 lb Date of Birth:  [birth year]     BSA:          1.666 m Patient Age:    81 years      BP:           131/66 mmHg Patient Gender: F             HR:           67 bpm. Exam Location:  [facility] Procedure: 2D Echo, Cardiac Doppler and Color Doppler Indications:     Stroke I63.9  History:         Patient has prior history of Echocardiogram examinations, most                  recent 02/25/2021. COPD, Arrythmias:Atrial Fibrillation;                  Signs/Symptoms:Murmur.  Sonographer:     Sherrie Sport Referring Phys:  [id number] Ivor Costa Diagnosing Phys: Ida Rogue MD  Sonographer Comments: No parasternal window and suboptimal apical window. Image acquisition challenging due to COPD. IMPRESSIONS  1. Left ventricular ejection fraction, by estimation, is 60 to 65%. The left ventricle has normal function. The left ventricle has no regional wall motion abnormalities. Left ventricular diastolic parameters are consistent with Grade I diastolic dysfunction (impaired relaxation).  2. Right ventricular systolic function is normal. The right ventricular size is normal. There is mildly elevated pulmonary artery systolic pressure. The estimated right ventricular  systolic pressure is 27.7 mmHg.  3. The mitral valve is normal in structure. No evidence of mitral valve regurgitation. No evidence of mitral stenosis.  4. The aortic valve is normal in structure. Aortic valve regurgitation is mild to moderate. No aortic stenosis is present.  5. The inferior vena cava is normal in size with greater than 50% respiratory variability, suggesting right atrial pressure of 3 mmHg. FINDINGS  Left Ventricle: Left ventricular ejection fraction, by estimation, is 60 to 65%. The left ventricle has normal function. The left ventricle has no regional wall motion abnormalities. The left ventricular internal cavity size was normal in size. There is  no left ventricular hypertrophy. Left ventricular diastolic parameters are consistent with Grade I diastolic dysfunction (impaired relaxation). Right Ventricle: The right ventricular size is normal. No increase in right ventricular wall thickness. Right ventricular systolic function is normal. There is mildly elevated pulmonary artery  systolic pressure. The tricuspid regurgitant velocity is 3.00  m/s, and with an assumed right atrial pressure of 5 mmHg, the estimated right ventricular systolic pressure is 41.6 mmHg. Left Atrium: Left atrial size was normal in size. Right Atrium: Right atrial size was normal in size. Pericardium: There is no evidence of pericardial effusion. Mitral Valve: The mitral valve is normal in structure. Mild mitral annular calcification. No evidence of mitral valve regurgitation. No evidence of mitral valve stenosis. MV peak gradient, 5.9 mmHg. The mean mitral valve gradient is 2.0 mmHg. Tricuspid Valve: The tricuspid valve is normal in structure. Tricuspid valve regurgitation is not demonstrated. No evidence of tricuspid stenosis. Aortic Valve: The aortic valve is normal in structure. Aortic valve regurgitation is mild to moderate. No aortic stenosis is present. Aortic valve mean gradient measures 2.5 mmHg. Aortic valve peak  gradient measures 4.3 mmHg. Aortic valve area, by VTI measures 3.16 cm. Pulmonic Valve: The pulmonic valve was normal in structure. Pulmonic valve regurgitation is not visualized. No evidence of pulmonic stenosis. Aorta: The aortic root is normal in size and structure. Venous: The inferior vena cava is normal in size with greater than 50% respiratory variability, suggesting right atrial pressure of 3 mmHg. IAS/Shunts: No atrial level shunt detected by color flow Doppler.  LEFT VENTRICLE PLAX 2D LVIDd:         3.54 cm   Diastology LVIDs:         2.34 cm   LV e' medial:    5.22 cm/s LV PW:         0.98 cm   LV E/e' medial:  12.3 LV IVS:        0.89 cm   LV e' lateral:   7.40 cm/s LVOT diam:     2.00 cm   LV E/e' lateral: 8.7 LV SV:         66 LV SV Index:   40 LVOT Area:     3.14 cm  RIGHT VENTRICLE RV Basal diam:  3.40 cm RV S prime:     14.80 cm/s TAPSE (M-mode): 2.7 cm LEFT ATRIUM             Index        RIGHT ATRIUM           Index LA diam:        3.60 cm 2.16 cm/m   RA Area:     18.20 cm LA Vol (A2C):   91.5 ml 54.93 ml/m  RA Volume:   52.90 ml  31.76 ml/m LA Vol (A4C):   62.7 ml 37.64 ml/m LA Biplane Vol: 77.7 ml 46.64 ml/m  AORTIC VALVE AV Area (Vmax):    2.47 cm AV Area (Vmean):   2.53 cm AV Area (VTI):     3.16 cm AV Vmax:           104.05 cm/s AV Vmean:          68.000 cm/s AV VTI:            0.211 m AV Peak Grad:      4.3 mmHg AV Mean Grad:      2.5 mmHg LVOT Vmax:         81.90 cm/s LVOT Vmean:        54.700 cm/s LVOT VTI:          0.212 m LVOT/AV VTI ratio: 1.00  AORTA Ao Root diam: 2.50 cm MITRAL VALVE  TRICUSPID VALVE MV Area (PHT): 2.76 cm     TR Peak grad:   36.0 mmHg MV Area VTI:   2.13 cm     TR Vmax:        300.00 cm/s MV Peak grad:  5.9 mmHg MV Mean grad:  2.0 mmHg     SHUNTS MV Vmax:       1.21 m/s     Systemic VTI:  0.21 m MV Vmean:      72.7 cm/s    Systemic Diam: 2.00 cm MV Decel Time: 275 msec MV E velocity: 64.30 cm/s MV A velocity: 117.00 cm/s MV E/A ratio:  0.55  Ida Rogue MD Electronically signed by Ida Rogue MD Signature Date/Time: 07/17/2021/4:35:51 PM    Final    [place] CORE BIOPSY (LYMPH NODES)  Result Date: 07/21/2021 INDICATION: 81 year old with history of lung cancer. 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 Plan for ultrasound-guided liver lesion biopsy. Patient also notes a new nodule on the left side of her neck. EXAM: ULTRASOUND-GUIDED LEFT CERVICAL LYMPH NODE BIOPSY MEDICATIONS: None. ANESTHESIA/SEDATION: None FLUOROSCOPY TIME:  None COMPLICATIONS: None immediate. PROCEDURE: Informed written consent was obtained from the patient after a thorough discussion of the procedural risks, benefits and alternatives. All questions were addressed. A timeout was performed prior to the initiation of the procedure. Liver was thoroughly evaluated with ultrasound. The liver is heterogeneous but a discrete lesion was not identified. Left side of the neck was evaluated with ultrasound and an abnormal small lymph node on the left side of the neck was identified. Left cervical lymph node was targeted for biopsy. The left side of the neck was prepped with chlorhexidine and sterile field was created. Skin was anesthetized with 1% lidocaine. Small incision was made. Using ultrasound guidance, an 18 gauge core device was directed into the lymph node. Four core biopsies were obtained and placed on a Telfa pad with saline. Bandage placed over the puncture site. FINDINGS: Liver is heterogeneous but no discrete lesions could be identified. Therefore, the neck was evaluated for supraclavicular lymphadenopathy. Patient noted a bump on the left side of the neck and there was a rounded small abnormal lymph node at the area of concern. There is also a slightly prominent left supraclavicular lymph node which was not amenable for biopsy. The lymph node in the left mid neck was targeted and biopsied. Biopsy needle was confirmed within the  lesion. No immediate bleeding or hematoma formation. IMPRESSION: 1. Ultrasound-guided core biopsy of a small but abnormal looking lymph node on the left side of the neck. 2. Ultrasound-guided liver biopsy was not performed because the liver lesions are not clearly visible on ultrasound. If the neck biopsy is inconclusive or negative, consider further evaluation with PET-CT. CT-guided liver lesion biopsy could be attempted as well. Electronically Signed   By: Markus Daft M.D.   On: 07/21/2021 15:33   CT CHEST ABDOMEN PELVIS WO CONTRAST  Result Date: 07/17/2021 CLINICAL DATA:  Bone lesions seen on brain MRI. Evaluate for underlying malignancy. History of lung cancer. EXAM: CT CHEST, ABDOMEN AND PELVIS WITHOUT CONTRAST TECHNIQUE: Multidetector CT imaging of the chest, abdomen and pelvis was performed following the standard protocol without IV contrast. RADIATION DOSE REDUCTION: This exam was performed according to the departmental dose-optimization program which includes automated exposure control, adjustment of the mA and/or kV according to patient size and/or use of iterative reconstruction technique. COMPARISON:  Chest CT 04/10/2021 FINDINGS: CT CHEST FINDINGS  Cardiovascular: The heart is normal in size. No pericardial effusion. The aorta is normal in caliber. Stable atherosclerotic calcifications. Remarkably no coronary artery calcifications. Mediastinum/Nodes: Progressive left hilar adenopathy the, difficult to measure without contrast. New subcarinal adenopathy with 12.5 mm node on image 28/2. 8.5 mm right paratracheal node on image 20/2. Lungs/Pleura: Enlarging left upper lobe/suprahilar mass measuring approximately 3 cm on image 33/4. Findings consistent with recurrent lung cancer and left hilar and mediastinal adenopathy. No new pulmonary nodules to suggest pulmonary metastatic disease. Progressive right basilar scarring changes and streaky basilar atelectasis. Musculoskeletal: No breast masses are  identified. No supraclavicular adenopathy. A few scattered axillary lymph nodes are stable. Suspect scattered subtle slightly sclerotic bone lesions. CT ABDOMEN PELVIS FINDINGS Hepatobiliary: New diffuse hepatic metastatic disease. Numerous small lesions throughout both lobes of the liver. The largest lesion at the right hepatic dome measures 2.5 cm on image 43/2. The gallbladder is surgically absent. No common bile duct dilatation. Pancreas: No mass, inflammation or ductal dilatation. Spleen: Normal size.  No focal lesions. Adrenals/Urinary Tract: Stable right adrenal gland nodule. No worrisome renal lesions are identified without contrast. 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 No complicating features. The small bowel and colon are grossly normal. Vascular/Lymphatic: Stable atherosclerotic calcifications involving the aorta and iliac arteries but no aneurysm. Small scattered mesenteric and retroperitoneal lymph nodes but no mass or overt adenopathy the. Reproductive: Surgically absent. Other: No pelvic mass or adenopathy. No free pelvic fluid collections. No inguinal mass or adenopathy. No abdominal wall hernia or subcutaneous lesions. Musculoskeletal: No lytic destructive bone lesions. No spinal canal compromise. IMPRESSION: 1. Enlarging left upper lobe/suprahilar mass with associated left hilar and mediastinal adenopathy consistent with recurrent lung cancer. 2. New diffuse hepatic metastatic disease. 3. Suspect scattered subtle slightly sclerotic bone lesions. No lytic or destructive bone lesions. 4. PET-CT may be helpful for accurate staging, if necessary. 5. Stable right adrenal gland nodule. 6. 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 * onc * Aortic Atherosclerosis (ICD10-I70.0). Electronically Signed   By: Marijo Sanes M.D.   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 Status post  ultrasound-guided left cervical lymphadenopathy.  Pathology is  Positive for adenocarcinoma, lung origin.  Pathology report was reviewed and discussed with patient and family members.  They understand that condition is not curable. 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 send NGS on the current specimen. If she does not have any targetable mutation, then treatment option will be chemotherapy plus minus immunotherapy. Patient needs to follow-up outpatient at the cancer center to review pathology results and management plan.    #Acute embolic stroke, recurrent.  Secondary to A-fib and likely underlying hypercoagulable state due to cancer. Neurology recommendation was reviewed.   Discussed with neurology Dr. Quinn Axe.  After weighing the thrombosis risk and hemorrhagic conversion of her stroke, Dr. Quinn Axe recommends to hold off anticoagulation for 3 days, and bridge with aspirin 81 mg daily.   #Anemia, normocytic, CKD Anemia work-up showed normal vitamin B12, folate level, slightly increased TSH 4.9, negative M protein on SPEP, decreased IgG level.  Normal free light chain panel.iron panel showed ferritin of 242, TIBC 297, iron saturation 8.  Reticulocyte hemoglobin normal at 31.  This is consistent with anemia of chronic disease.  Slightly decreased iron saturation.  Continue oral iron supplementation.  #Thrombocytopenia, etiology unknown.  Immature platelet fraction is 7.8, inappropriately normal.  Possible decreased bone marrow production, as well as consumption.  Close monitor.   Thank you for allowing me to participate in the care of this patient.   Earlie Server, MD, PhD Hematology Oncology 07/23/2021

## 2021-07-23 NOTE — Evaluation (Signed)
Occupational Therapy Re-Evaluation ?Patient Details ?Name: Natasha [name] ?MRN: [id number] ?DOB: 1940-07-01 ?Today's Date: 07/23/2021 ? ? ?History of Present Illness 81 y.o. female with history of atrial fibrillation on Eliquis, CHF, hypertension, hyperlipidemia, diabetes, COPD, chronic kidney disease, lung CA, depression, and recent R carpal tunnel release (Jul 15, 2021), who presents to the emergency department with complaints of vertigo. 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  ? ?Clinical Impression ?  ?Natasha [name] was seen for OT/PT co- treatment on this date, re-evaluated 2/2 new acute infarcts. Pt requiring MOD A x2 exit R side of bed, CGA static sitting decreasing to MIN A with fatigue. MAX A x2 step pivot bed>chair t/f. Attempted sit<>stand x2 from bed with and without RW, pt demonstrates increased LUE weakness, unable to grasp RW. MIN A don glasses sitting in chair. Left in chair with family in room. Pt making good progress toward goals. Pt continues to benefit from skilled OT services to maximize return to PLOF and minimize risk of future falls, injury, caregiver burden, and readmission. Will continue to follow POC. Discharge recommendation remains appropriate.  ?   ? ?Recommendations for follow up therapy are one component of a multi-disciplinary discharge planning process, led by the attending physician.  Recommendations may be updated based on patient status, additional functional criteria and insurance authorization.  ? ?Follow Up Recommendations ? Skilled nursing-short term rehab (<3 hours/day)  ?  ?Assistance Recommended at Discharge Frequent or constant Supervision/Assistance  ?Patient can return home with the following Two people to help with walking and/or transfers;Two people to help with bathing/dressing/bathroom;Help with stairs or ramp for  entrance ? ?  ?   ?Equipment Recommendations ? Other (comment) (defer)  ?  ?   ?Precautions / Restrictions Precautions ?Precautions: Fall ?Precaution Comments: recent carpal tunnel sx R UE ?Restrictions ?Weight Bearing Restrictions: No  ? ?  ? ?Mobility Bed Mobility ?Overal bed mobility: Needs Assistance ?Bed Mobility: Supine to Sit ?  ?  ?Supine to sit: +2 for physical assistance, Mod assist ?  ?  ?  ?  ? ?Transfers ?Overall transfer level: Needs assistance ?  ?Transfers: Sit to/from Stand, Bed to chair/wheelchair/BSC ?Sit to Stand: Max assist, +2 physical assistance ?  ?  ?Step pivot transfers: Max assist, +2 physical assistance ?  ?  ?  ?  ? ?  ?Balance Overall balance assessment: Needs assistance ?Sitting-balance support: Feet supported, Bilateral upper extremity supported ?Sitting balance-Leahy Scale: Fair ?Sitting balance - Comments: CGA decreasing to MIN A as pt fatigues ?  ?Standing balance support: Single extremity supported ?Standing balance-Leahy Scale: Zero ?Standing balance comment: heavy L lateral lean, does not achieve upright posture with and without RW trialed ?  ?  ?  ?  ?  ?  ?  ?  ?  ?  ?  ?   ? ?ADL either performed or assessed with clinical judgement  ? ?ADL Overall ADL's : Needs assistance/impaired ?  ?  ?  ?  ?  ?  ?  ?  ?  ?  ?  ?  ?  ?  ?  ?  ?  ?  ?  ?General ADL Comments: MAX A x2 for ADL t/f. MIN A don glasses sitting in chair.  ? ? ? ? ?Pertinent Vitals/Pain Pain Assessment ?Pain Assessment: Faces ?Faces Pain Scale: Hurts even more ?Pain Location: b/l shoulders ?Pain  Descriptors / Indicators: Discomfort, Guarding, Grimacing ?Pain Intervention(s): Limited activity within patient's tolerance, Repositioned  ? ? ? ?Hand Dominance   ?  ?Extremity/Trunk Assessment Upper Extremity Assessment ?Upper Extremity Assessment: LUE deficits/detail;Generalized weakness;RUE deficits/detail ?RUE Coordination: decreased gross motor ?LUE Deficits / Details: achieves full fist however unable to maintain  grip on RW ?LUE: Unable to fully assess due to pain ?LUE Coordination: decreased gross motor ?  ?Lower Extremity Assessment ?Lower Extremity Assessment: RLE deficits/detail;LLE deficits/detail;Generalized weakness ?RLE Coordination: decreased gross motor ?LLE Coordination: decreased gross motor ?  ?  ?  ?Communication Communication ?Communication: No difficulties ?  ?Cognition Arousal/Alertness: Awake/alert ?Behavior During Therapy: Flat affect ?Overall Cognitive Status: Impaired/Different from baseline ?  ?  ?  ?  ?  ?  ?  ?  ?  ?  ?  ?  ?  ?  ?  ?  ?General Comments: follows 1 step commands inconsistently, engages appropriately in conversation ?  ?  ? ?Home Living Family/patient expects to be discharged to:: Private residence ?Living Arrangements: Spouse/significant other ?Available Help at Discharge: Family;Available 24 hours/day ?Type of Home: House ?Home Access: Stairs to enter ?Entrance Stairs-Number of Steps: 3 ?Entrance Stairs-Rails: None ?Home Layout: One level ?  ?  ?  ?  ?  ?  ?  ?Home Equipment: None ?  ?  ?  ? ?  ?Prior Functioning/Environment Prior Level of Function : Needs assist ?  ?  ?  ?  ?  ?  ?Mobility Comments: patient requires assistance from husband for transfers PRN ?ADLs Comments: independent ?  ? ?  ?  ?   ?   ?   ?OT Goals(Current goals can be found in the care plan section) Acute Rehab OT Goals ?Patient Stated Goal: to get better ?OT Goal Formulation: With patient/family ?Time For Goal Achievement: 08/06/21 ?Potential to Achieve Goals: Good ?ADL Goals ?Pt Will Perform Grooming: standing;with min assist ?Pt Will Perform Lower Body Dressing: with min assist;sit to/from stand ?Pt Will Transfer to Toilet: ambulating;bedside commode;with min assist  ?OT Frequency: Min 4X/week ?  ? ?Co-evaluation PT/OT/SLP Co-Evaluation/Treatment: Yes ?Reason for Co-Treatment: Complexity of the patient's impairments (multi-system involvement);For patient/therapist safety;To address functional/ADL transfers ?PT  goals addressed during session: Mobility/safety with mobility;Balance ?OT goals addressed during session: ADL's and self-care;Strengthening/ROM ?  ? ?  ?AM-PAC OT "6 Clicks" Daily Activity     ?Outcome Measure Help from another person eating meals?: A Little ?Help from another person taking care of personal grooming?: A Lot ?Help from another person toileting, which includes using toliet, bedpan, or urinal?: A Lot ?Help from another person bathing (including washing, rinsing, drying)?: A Lot ?Help from another person to put on and taking off regular upper body clothing?: A Little ?Help from another person to put on and taking off regular lower body clothing?: A Lot ?6 Click Score: 14 ?  ?End of Session Equipment Utilized During Treatment: Rolling walker (2 wheels) ?Nurse Communication: Mobility status ? ?Activity Tolerance: Patient limited by pain;Patient tolerated treatment well ?Patient left: in chair;with call bell/phone within reach;with chair alarm set;with family/visitor present ? ?OT Visit Diagnosis: Other abnormalities of gait and mobility (R26.89);Muscle weakness (generalized) (M62.81)  ?              ?Time: 2010-0712 ?OT Time Calculation (min): 32 min ?Charges:  OT General Charges ?$OT Visit: 1 Visit ?OT Evaluation ?$OT Re-eval: 1 Re-eval ?OT Treatments ?$Self Care/Home Management : 8-22 mins ? ?Dessie Coma, M.S. OTR/L  ?07/23/21, 4:27 PM  ?  ascom [phone] ? ?

## 2021-07-23 NOTE — Progress Notes (Signed)
S: Diplopia has resolved. BLE strength improved. No new deficits today.  ? ?O: ? ?Vitals:  ? 07/23/21 1110 07/23/21 1544  ?BP: (!) 120/50 (!) 101/38  ?Pulse: 74 71  ?Resp: 15 18  ?Temp: 97.8 ?F (36.6 ?C) 97.7 ?F (36.5 ?C)  ?SpO2: 94% 100%  ? ? ?Physical Exam ?Gen: A&Ox4, NAD ?HEENT: Atraumatic, normocephalic; oropharynx clear, tongue without atrophy or fasciculations. ?Resp: CTAB, normal work of breathing ?CV: RRR, extremities appear well-perfused. ?Abd: soft/NT/ND ?Extrem: Nml bulk; no cyanosis, clubbing, or edema. ? ?Neuro: ?*MS: A&O x4. Follows multi-step commands.  ?*Speech: no dysarthria or aphasia, able to name and repeat. ?*CN:  ?  I: Deferred ?  II,III: PERRLA, RHH, optic discs not visualized 2/2 pupillary constriction ?  III,IV,VI: EOMI w/o nystagmus, no ptosis ?  V: Sensation intact from V1 to V3 to LT ?  VII: Eyelid closure was full.  L UMN facial droop ?  VIII: Hearing intact to voice ?  IX,X: Voice normal, palate elevates symmetrically  ?  XI: SCM/trap 5/5 bilat   ?XII: Tongue protrudes midline, no atrophy or fasciculations  ?*Motor:   Normal bulk.  No tremor, rigidity or bradykinesia. No pronator drift. RUE full strength throughout, LUE 4/5 diffusely with drift but not to bed, able to wiggle toes BLE but no movement anti-gravity ?*Sensory: SILT ?*Coordination:  ataxia L FNF ?*Reflexes:  1+ and symmetric throughout without clonus; toes down-going bilat ?*Gait: deferred ? ?NIHSS ?1a Level of Conscious.: 0 ?1b LOC Questions: 0 ?1c LOC Commands: 0 ?2 Best Gaze: 0 ?3 Visual: 1 ?4 Facial Palsy: 1 ?5a Motor Arm - left: 1 ?5b Motor Arm - Right: 0 ?6a Motor Leg - Left: 2 ?6b Motor Leg - Right: 2 ?7 Limb Ataxia: 1 ?8 Sensory: 0 ?9 Best Language: 0 ?10 Dysarthria: 0 ?11 Extinct. and Inatten.: 0 ? ?TOTAL: 8 ? ?A/P: 81 year old female with a history of atrial flutter who is on anticoagulation, but this has been held due to surgery with strokes consistent with emboli in the setting of holding Eliquis.  Her eliquis  was restarted today but RN noted her to have BLE weakness that was new and repeat MRI brain today showed new strokes since 5 days ago. Given the size of her L occipital stroke eliquis will need to be held again to reduce the risk of hemorrhagic conversion. Plan to hold anticoagulation for total of another 3 days (would prefer 5, but given that she keeps embolizing, 3 days seems to be optimal risk/benefit time period). She will need to be rescanned with noncon head CT prior to restarting anticoagulation to r/o interval hemorrhagic conversion. I will see patient again tomorrow and discuss with family. I initially said that anticoagulation should be restarted Saturday but upon further discussion with her family about the timing of her new symptoms, I think a 3 day hold would mean restarting on Friday not Saturday. I will discuss this with CM and hospitalist tomorrow as she may be able to be discharged to facility Friday afternoon.  ? ?- Hold eliquis with plan to restart Friday after noncon head CT r/o interval hemorrhagic conversion ?- ASA $Remo'81mg'LScqX$  daily while eliquis is held, stop aspirin when eliquis is restarted ?- Goal normotension, strict avoidance of hypotension ?- STAT head CT for any change in exam ?- PT/OT/SLP ? ?Su Monks, MD ?[facility] ?[phone] ? ?If 7pm- 7am, please page neurology on call as listed in [place]. ? ? ? ?

## 2021-07-23 NOTE — Progress Notes (Signed)
PROGRESS NOTE  Natasha [name]    DOB: 06-27-1940, 81 y.o.  MGS:[id number]    Code Status: Full Code   DOA: 07/17/2021   LOS: 5   Brief hospital course  Natasha [name] is a 81 y.o. female with a PMH significant for A-fib on Eliquis, HTN, HLD, type II DM, COPD, asthma, GERD, depression, former smoker, RLS, OSA, CKD 4, CHF, H/o lung cancer s/p radiation therapy, IDA, chronic pain. They presented from home to the ED on 07/17/2021 with dizziness upon waking up on 2/23 and was her normal self when went to bed that previous night. She had worsening of the symptoms and included left leg weakness.  Of note, pt is typically on chronic anticoagulation via Eliquis, however, this medication was held for scheduled right-sided carpal tunnel release, which occurred on 07/15/2021. In the ED, it was found that they had 2 small right cerebellar infarcts on head CT.  This was followed by brain MRI which showed numerous small acute infarcts scattered in the brain and compatible with central embolic disease.  Additionally, this imaging showed multiple bone lesions that were not previously seen on recent head imaging in 02/2021 and are consistent with metastatic disease. Neurology was consulted for further stroke evaluation and treatment.  They were treated with continued holding of Eliquis, permissive hypertension, PT/OT consult.  Patient was admitted to medicine service for further workup and management of acute embolic CVA as outlined in detail below. 2/28- patient had recurrent stroke symptoms and new lesions seen on repeat head CT.   07/23/21 -stable  Assessment & Plan  Principal Problem:   Acute embolic stroke [facility]) Active Problems:   Essential hypertension   Iron deficiency anemia   OSA (obstructive sleep apnea)   Paroxysmal atrial fibrillation (HCC)   Restless leg syndrome   CKD (chronic kidney disease) stage 4, GFR 15-29 ml/min (HCC)   HLD (hyperlipidemia)   Type II diabetes mellitus with renal  manifestations (HCC)   COPD (chronic obstructive pulmonary disease) (HCC)   Chronic diastolic CHF (congestive heart failure) (HCC)   Leukocytosis   Bone lesion   Lung cancer (HCC)   Carpal tunnel syndrome on right   Lesion of liver   CVA (cerebral vascular accident) (HCC)  CVA-recurrence of new lesions and symptoms 2/28.  Pattern is significant for embolic disease in setting of held anticoagulations for recent surgery. -Neurology following, appreciate recommendations -Continue to hold anticoagulation until 3/4 to avoid conversion to hemorrhagic stroke -Repeat head imaging prior to restarting anticoagulation -Continue ASA 81 mg until Eliquis restarted -Permissive hypertension, avoid hypotension -PT/OT/SLP -Stat head CT for any new emerging symptoms  New incidental finding of bone lesions seen on head/C-spine imaging suspicious of metastatic disease potentially related to history of lung cancer s/p radiation therapy.  S/p lymph node biopsy 2/27 - heme/onc following, appreciate recommendations -f/u biopsy results  HTN-permissive hypertension -Titrate back on home blood pressure medications as able or indicated  PAF-Eliquis was restarted on 2/28 after being held for her recent carpal tunnel release surgery.  Rate controlled -Continue amiodarone  AKI on CKD 4-gradual improvement Cr 2.2>2.08 -Continue IV fluids today  Type II DM   HLD -Continue statin -Continue sliding scale -Continue Semglee 10 units daily  COPD-chronic, stable -As needed bronchodilators  HFpEF-holding diuretics  Restless leg syndrome -Continue home medication  OSA -Continue CPAP nightly  IDA -Continue iron supplement  Carpal tunnel syndrome on right s/p release on 2/21 -Consult Ortho for suture removal  Depression- chronic, stable -Continue home sertraline  Body mass index is 23.88 kg/m.  VTE ppx: Place and maintain sequential compression device Start: 07/18/21 0658 SCDs Start: 07/17/21  0525   Diet:     Diet   Diet Carb Modified Fluid consistency: Thin; Room service appropriate? Yes with Assist   Subjective 07/23/21    Pt reports no new symptoms today.  Denies any complaints or concerns at this time.   Objective   Vitals:   07/23/21 0305 07/23/21 0424 07/23/21 0429 07/23/21 0519  BP: (!) 118/52 (!) 116/51 (!) 114/57   Pulse: 68 66 67   Resp: 18 18    Temp: 97.9 F (36.6 C) 97.7 F (36.5 C)    TempSrc:  Oral    SpO2: 96% 93% 96%   Weight:    63.1 kg  Height:        Intake/Output Summary (Last 24 hours) at 07/23/2021 0718 Last data filed at 07/23/2021 0300 Gross per 24 hour  Intake 635.65 ml  Output 600 ml  Net 35.65 ml   Filed Weights   07/19/21 0500 07/20/21 0500 07/23/21 0519  Weight: 61.4 kg 61 kg 63.1 kg     Physical Exam:  General: awake, alert, NAD HEENT: atraumatic, clear conjunctiva, anicteric sclera, MMM, hearing grossly normal Respiratory: normal respiratory effort. Cardiovascular: quick capillary refill  Nervous: A&O x3. Moving all extremities, normal speech Extremities: moves all equally, no edema, normal tone Skin: dry, intact, normal temperature, normal color. No rashes, lesions or ulcers on exposed skin Psychiatry: normal mood, congruent affect  Labs   I have personally reviewed the following labs and imaging studies CBC    Component Value Date/Time   WBC 10.7 (H) 07/23/2021 0558   RBC 3.40 (L) 07/23/2021 0558   HGB 9.4 (L) 07/23/2021 0558   HCT 29.9 (L) 07/23/2021 0558   PLT 70 (L) 07/23/2021 0558   MCV 87.9 07/23/2021 0558   MCH 27.6 07/23/2021 0558   MCHC 31.4 07/23/2021 0558   RDW 16.9 (H) 07/23/2021 0558   LYMPHSABS 1.3 06/16/2021 1039   MONOABS 0.6 06/16/2021 1039   EOSABS 0.1 06/16/2021 1039   BASOSABS 0.0 06/16/2021 1039   BMP Latest Ref Rng & Units 07/22/2021 07/21/2021 07/20/2021  Glucose 70 - 99 mg/dL 238(H) 194(H) 194(H)  BUN 8 - 23 mg/dL 66(H) 51(H) 42(H)  Creatinine 0.44 - 1.00 mg/dL 2.24(H) 1.81(H) 1.58(H)   BUN/Creat Ratio 6 - 22 (calc) - - -  Sodium 135 - 145 mmol/L 135 138 134(L)  Potassium 3.5 - 5.1 mmol/L 4.4 4.3 4.2  Chloride 98 - 111 mmol/L 104 105 104  CO2 22 - 32 mmol/L 17(L) 20(L) 21(L)  Calcium 8.9 - 10.3 mg/dL 8.4(L) 8.2(L) 8.2(L)    MR BRAIN WO CONTRAST  Result Date: 07/22/2021 CLINICAL DATA:  Acute neuro deficit. Stroke. Now with bilateral leg weakness and diplopia. EXAM: MRI HEAD WITHOUT CONTRAST TECHNIQUE: Multiplanar, multiecho pulse sequences of the brain and surrounding structures were obtained without intravenous contrast. COMPARISON:  MRI head 07/17/2021 FINDINGS: Brain: Numerous areas of acute infarct are present compatible with emboli. Multiple small infarcts in the cerebellum bilaterally are stable. Progressive acute infarct in the left occipital pole with mild associated petechial hemorrhage which was seen previously. Multiple small areas of acute infarct in the frontal and parietal lobes bilaterally and in the white matter. Progression of cluster of acute infarcts in the right parietal white matter and also in the right medial parietal cortex. Small acute infarct head of caudate on the left unchanged. Small acute infarct head  of caudate on the right is new. Ventricle size normal.  No mass or midline shift. Vascular: Normal arterial flow voids. Skull and upper cervical spine: Bilateral calvarial lesions are again noted. These are suspicious for metastatic disease. Sinuses/Orbits: Paranasal sinuses clear. Bilateral cataract extraction Other: None IMPRESSION: Numerous areas of acute infarct in the cerebrum and cerebellum bilaterally compatible with acute embolic infarction. There has been progression of acute infarcts since the recent MRI of 07/17/2021. Findings suggest recurrent emboli. Progression of infarct left occipital pole. Petechial hemorrhage in this area unchanged from the prior study. No other hemorrhage. Lesions in the calvarium bilaterally, suspicious for metastatic  disease. Electronically Signed   By: Franchot Gallo M.D.   On: 07/22/2021 14:59   [place] CORE BIOPSY (LYMPH NODES)  Result Date: 07/21/2021 INDICATION: 81 year old with history of lung cancer. 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 Plan for ultrasound-guided liver lesion biopsy. Patient also notes a new nodule on the left side of her neck. EXAM: ULTRASOUND-GUIDED LEFT CERVICAL LYMPH NODE BIOPSY MEDICATIONS: None. ANESTHESIA/SEDATION: None FLUOROSCOPY TIME:  None COMPLICATIONS: None immediate. PROCEDURE: Informed written consent was obtained from the patient after a thorough discussion of the procedural risks, benefits and alternatives. All questions were addressed. A timeout was performed prior to the initiation of the procedure. Liver was thoroughly evaluated with ultrasound. The liver is heterogeneous but a discrete lesion was not identified. Left side of the neck was evaluated with ultrasound and an abnormal small lymph node on the left side of the neck was identified. Left cervical lymph node was targeted for biopsy. The left side of the neck was prepped with chlorhexidine and sterile field was created. Skin was anesthetized with 1% lidocaine. Small incision was made. Using ultrasound guidance, an 18 gauge core device was directed into the lymph node. Four core biopsies were obtained and placed on a Telfa pad with saline. Bandage placed over the puncture site. FINDINGS: Liver is heterogeneous but no discrete lesions could be identified. Therefore, the neck was evaluated for supraclavicular lymphadenopathy. Patient noted a bump on the left side of the neck and there was a rounded small abnormal lymph node at the area of concern. There is also a slightly prominent left supraclavicular lymph node which was not amenable for biopsy. The lymph node in the left mid neck was targeted and biopsied. Biopsy needle was confirmed within the lesion. No immediate bleeding or  hematoma formation. IMPRESSION: 1. Ultrasound-guided core biopsy of a small but abnormal looking lymph node on the left side of the neck. 2. Ultrasound-guided liver biopsy was not performed because the liver lesions are not clearly visible on ultrasound. If the neck biopsy is inconclusive or negative, consider further evaluation with PET-CT. CT-guided liver lesion biopsy could be attempted as well. Electronically Signed   By: Markus Daft M.D.   On: 07/21/2021 15:33    Disposition Plan & Communication  Patient status: Inpatient  Admitted From: Home Planned disposition location: Skilled nursing facility Anticipated discharge date: 3/6 pending no new stroke symptoms and tolerating anticoagulation, neuro clearance, and not needing oncology inpatient workup  Family Communication: heme/onc and neuro to family    Author: Richarda Osmond, DO [facility] 07/23/2021, 7:18 AM   Available by Epic secure chat 7AM-7PM. If 7PM-7AM, please contact night-coverage.  [facility] contact information found on [email or web address].

## 2021-07-23 NOTE — Evaluation (Addendum)
Physical Therapy Re-Evaluation Patient Details Name: Natasha [name] MRN: [id number] DOB: February 06, 1941 Today's Date: 07/23/2021  History of Present Illness  81 y.o. female with history of atrial fibrillation on Eliquis, CHF, hypertension, hyperlipidemia, diabetes, COPD, chronic kidney disease, lung CA, depression, and recent R carpal tunnel release (Jul 15, 2021), who presents to the emergency department with complaints of vertigo. 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   Clinical Impression   Pt resting in bed upon PT/OT entrance into room for co-reevaluation due to complexity of the patient's impairments and for patient/therapist safety. PT is A&Ox4 and denies any c/o pain at rest. Pt reports she lives at home w/ her husband and prior to hospitalization was independent w/ ADLs and required some assistance from her husband for transfers.  Pt was able to complete LE exercises (see "General LE Exercises" section) both in bed prior to mobility. Pt requires modAx2 for bed mobility to sit EOB. Once sitting EOB she is able to static sit for ~65min independently w/ occasional verbal and tactile cues in order to maintain sitting balance. After ~56min she required minA to continue to sit EOB prior attempting transfers to recline. Sit to stand was attempted w/maxAx2 and RW twice, but were unsuccessful. Following those attempt it was attempted again w/o RW and Pt was able to take a few side-steps in order to sit in recliner. Pt c/o bilateral UE/shoulder pain throughout transfer attempts but did not quantify a pain level. Pt will benefit from continued skilled PT in order to improve LE strength, mobility, gait, and restore PLOF. Current discharge recommendation to SNF is appropriate due to the level of assistance required by the patient to ensure safety and improve  overall function.      Recommendations for follow up therapy are one component of a multi-disciplinary discharge planning process, led by the attending physician.  Recommendations may be updated based on patient status, additional functional criteria and insurance authorization.  Follow Up Recommendations Skilled nursing-short term rehab (<3 hours/day)    Assistance Recommended at Discharge Frequent or constant Supervision/Assistance  Patient can return home with the following  Help with stairs or ramp for entrance;Assist for transportation;Assistance with cooking/housework;Two people to help with walking and/or transfers;Direct supervision/assist for medications management;Direct supervision/assist for financial management;Two people to help with bathing/dressing/bathroom    Equipment Recommendations    Recommendations for Other Services       Functional Status Assessment Patient has had a recent decline in their functional status and demonstrates the ability to make significant improvements in function in a reasonable and predictable amount of time.     Precautions / Restrictions Precautions Precautions: Fall Precaution Comments: recent carpal tunnel sx R UE Restrictions Weight Bearing Restrictions: No      Mobility  Bed Mobility Overal bed mobility: Needs Assistance Bed Mobility: Supine to Sit     Supine to sit: +2 for physical assistance, Mod assist          Transfers Overall transfer level: Needs assistance   Transfers: Sit to/from Stand, Bed to chair/wheelchair/BSC Sit to Stand: Max assist, +2 physical assistance   Step pivot transfers: Max assist, +2 physical assistance            Ambulation/Gait                  Stairs  Wheelchair Mobility    Modified Rankin (Stroke Patients Only)       Balance Overall balance assessment: Needs assistance Sitting-balance support: Feet supported, Bilateral upper extremity  supported Sitting balance-Leahy Scale: Fair Sitting balance - Comments: CGA decreasing to MIN A as pt fatigues   Standing balance support: Single extremity supported Standing balance-Leahy Scale: Zero Standing balance comment: heavy L lateral lean, does not achieve upright posture with and without RW trialed                             Pertinent Vitals/Pain Pain Assessment Pain Assessment: Faces Faces Pain Scale: Hurts even more Pain Location: b/l shoulders Pain Descriptors / Indicators: Discomfort, Guarding, Grimacing Pain Intervention(s): Limited activity within patient's tolerance, Repositioned    Home Living Family/patient expects to be discharged to:: Private residence Living Arrangements: Spouse/significant other Available Help at Discharge: Family;Available 24 hours/day Type of Home: House Home Access: Stairs to enter Entrance Stairs-Rails: None Entrance Stairs-Number of Steps: 3   Home Layout: One level Home Equipment: None      Prior Function Prior Level of Function : Needs assist             Mobility Comments: patient requires assistance from husband for transfers PRN ADLs Comments: independent     Hand Dominance        Extremity/Trunk Assessment   Upper Extremity Assessment Upper Extremity Assessment: LUE deficits/detail;Generalized weakness;RUE deficits/detail RUE Coordination: decreased gross motor LUE: Unable to fully assess due to pain LUE Coordination: decreased gross motor    Lower Extremity Assessment Lower Extremity Assessment: RLE deficits/detail;LLE deficits/detail;Generalized weakness RLE Coordination: decreased gross motor LLE Coordination: decreased gross motor       Communication   Communication: No difficulties  Cognition Arousal/Alertness: Awake/alert Behavior During Therapy: Flat affect Overall Cognitive Status: Impaired/Different from baseline                                 General Comments:  follows 1 step commands inconsistently, engages appropriately in conversation        General Comments      Exercises General Exercises - Lower Extremity Long Arc Quad: AROM, Both, 10 reps Hip ABduction/ADduction: AROM, Both, 10 reps Straight Leg Raises: AAROM, Both, 10 reps Hip Flexion/Marching: AROM, Both, 10 reps   Assessment/Plan    PT Assessment Patient needs continued PT services  PT Problem List Decreased strength;Decreased mobility;Decreased activity tolerance;Decreased balance;Decreased knowledge of use of DME;Decreased coordination;Pain;Decreased safety awareness       PT Treatment Interventions DME instruction;Therapeutic exercise;Gait training;Balance training;Stair training;Neuromuscular re-education;Functional mobility training;Therapeutic activities;Patient/family education    PT Goals (Current goals can be found in the Care Plan section)  Acute Rehab PT Goals Patient Stated Goal: return home PT Goal Formulation: With patient/family Time For Goal Achievement: 08/06/21 Potential to Achieve Goals: Fair    Frequency 7X/week     Co-evaluation PT/OT/SLP Co-Evaluation/Treatment: Yes Reason for Co-Treatment: Complexity of the patient's impairments (multi-system involvement);For patient/therapist safety;To address functional/ADL transfers PT goals addressed during session: Mobility/safety with mobility;Balance OT goals addressed during session: ADL's and self-care;Strengthening/ROM       AM-PAC PT "6 Clicks" Mobility  Outcome Measure Help needed turning from your back to your side while in a flat bed without using bedrails?: A Lot Help needed moving from lying on your back to sitting on the side of a flat bed without using bedrails?: A Lot  Help needed moving to and from a bed to a chair (including a wheelchair)?: Total Help needed standing up from a chair using your arms (e.g., wheelchair or bedside chair)?: Total Help needed to walk in hospital room?:  Total Help needed climbing 3-5 steps with a railing? : Total 6 Click Score: 8    End of Session   Activity Tolerance: Patient limited by fatigue;Patient limited by pain Patient left: in chair;with chair alarm set;with call bell/phone within reach;with family/visitor present Nurse Communication: Mobility status PT Visit Diagnosis: Unsteadiness on feet (R26.81);Other abnormalities of gait and mobility (R26.89);Difficulty in walking, not elsewhere classified (R26.2)    Time: 9169-4503 PT Time Calculation (min) (ACUTE ONLY): 34 min   Charges:             Jonnie Kind, SPT 07/23/2021, 4:14 PM

## 2021-07-24 ENCOUNTER — Encounter: Payer: Self-pay | Admitting: *Deleted

## 2021-07-24 DIAGNOSIS — L8990 Pressure ulcer of unspecified site, unspecified stage: Secondary | ICD-10-CM | POA: Insufficient documentation

## 2021-07-24 DIAGNOSIS — I639 Cerebral infarction, unspecified: Secondary | ICD-10-CM | POA: Diagnosis not present

## 2021-07-24 LAB — COMPREHENSIVE METABOLIC PANEL
ALT: 41 U/L (ref 0–44)
AST: 110 U/L — ABNORMAL HIGH (ref 15–41)
Albumin: 2.3 g/dL — ABNORMAL LOW (ref 3.5–5.0)
Alkaline Phosphatase: 220 U/L — ABNORMAL HIGH (ref 38–126)
Anion gap: 9 (ref 5–15)
BUN: 60 mg/dL — ABNORMAL HIGH (ref 8–23)
CO2: 19 mmol/L — ABNORMAL LOW (ref 22–32)
Calcium: 8.2 mg/dL — ABNORMAL LOW (ref 8.9–10.3)
Chloride: 107 mmol/L (ref 98–111)
Creatinine, Ser: 1.56 mg/dL — ABNORMAL HIGH (ref 0.44–1.00)
GFR, Estimated: 33 mL/min — ABNORMAL LOW (ref >60)
Glucose, Bld: 224 mg/dL — ABNORMAL HIGH (ref 70–99)
Potassium: 3.9 mmol/L (ref 3.5–5.1)
Sodium: 135 mmol/L (ref 135–145)
Total Bilirubin: 0.6 mg/dL (ref 0.3–1.2)
Total Protein: 4.7 g/dL — ABNORMAL LOW (ref 6.5–8.1)

## 2021-07-24 LAB — GLUCOSE, CAPILLARY
Glucose-Capillary: 231 mg/dL — ABNORMAL HIGH (ref 70–99)
Glucose-Capillary: 221 mg/dL — ABNORMAL HIGH (ref 70–99)
Glucose-Capillary: 217 mg/dL — ABNORMAL HIGH (ref 70–99)
Glucose-Capillary: 171 mg/dL — ABNORMAL HIGH (ref 70–99)

## 2021-07-24 MED ORDER — CHLORHEXIDINE GLUCONATE CLOTH 2 % EX PADS
6.0000 | MEDICATED_PAD | Freq: Every day | CUTANEOUS | Status: DC
  Administered 2021-07-24 – 2021-07-31 (×7): 6 via TOPICAL

## 2021-07-24 MED ORDER — INSULIN GLARGINE-YFGN 100 UNIT/ML ~~LOC~~ SOLN
15.0000 [IU] | Freq: Every day | SUBCUTANEOUS | Status: DC
  Administered 2021-07-24 – 2021-07-31 (×8): 15 [IU] via SUBCUTANEOUS
  Filled 2021-07-24 (×10): qty 0.15

## 2021-07-24 MED ORDER — MIDODRINE HCL 5 MG PO TABS
5.0000 mg | ORAL_TABLET | Freq: Three times a day (TID) | ORAL | Status: AC
  Administered 2021-07-24: 5 mg via ORAL
  Filled 2021-07-24: qty 1

## 2021-07-24 MED ORDER — MIDODRINE HCL 5 MG PO TABS: 5.0000 mg | ORAL_TABLET | Freq: Three times a day (TID) | ORAL | Status: DC

## 2021-07-24 NOTE — Progress Notes (Addendum)
S: Exam unchanged, no new deficits or complaints today.  ? ?O: ? ?Vitals:  ? 07/24/21 0427 07/24/21 1115  ?BP: (!) 136/52 (!) 98/45  ?Pulse: 73 76  ?Resp: 17 18  ?Temp: 98.2 ?F (36.8 ?C) 97.7 ?F (36.5 ?C)  ?SpO2: 96% 94%  ? ? ?Physical Exam ?Gen: A&Ox4, NAD ?HEENT: Atraumatic, normocephalic; oropharynx clear, tongue without atrophy or fasciculations. ?Resp: CTAB, normal work of breathing ?CV: RRR, extremities appear well-perfused. ?Abd: soft/NT/ND ?Extrem: Nml bulk; no cyanosis, clubbing, or edema. ? ?Neuro: ?*MS: A&O x4. Follows multi-step commands.  ?*Speech: no dysarthria or aphasia, able to name and repeat. ?*CN:  ?  I: Deferred ?  II,III: PERRLA, RHH, optic discs not visualized 2/2 pupillary constriction ?  III,IV,VI: EOMI w/o nystagmus, no ptosis ?  V: Sensation intact from V1 to V3 to LT ?  VII: Eyelid closure was full.  L UMN facial droop ?  VIII: Hearing intact to voice ?  IX,X: Voice normal, palate elevates symmetrically  ?  XI: SCM/trap 5/5 bilat   ?XII: Tongue protrudes midline, no atrophy or fasciculations  ?*Motor:   Normal bulk.  No tremor, rigidity or bradykinesia. No pronator drift. RUE full strength throughout, LUE 4/5 diffusely with drift but not to bed, able to wiggle toes BLE but no movement anti-gravity ?*Sensory: SILT ?*Coordination:  ataxia L FNF ?*Reflexes:  1+ and symmetric throughout without clonus; toes down-going bilat ?*Gait: deferred ? ?NIHSS ?1a Level of Conscious.: 0 ?1b LOC Questions: 0 ?1c LOC Commands: 0 ?2 Best Gaze: 0 ?3 Visual: 1 ?4 Facial Palsy: 1 ?5a Motor Arm - left: 1 ?5b Motor Arm - Right: 0 ?6a Motor Leg - Left: 2 ?6b Motor Leg - Right: 2 ?7 Limb Ataxia: 1 ?8 Sensory: 0 ?9 Best Language: 0 ?10 Dysarthria: 0 ?11 Extinct. and Inatten.: 0 ? ?TOTAL: 8 ? ?A/P: 81 year old female with a history of atrial flutter who is on anticoagulation, but this has been held due to surgery with strokes consistent with emboli in the setting of holding Eliquis.  Her eliquis was restarted today  but RN noted her to have BLE weakness that was new and repeat MRI brain today showed new strokes since 5 days ago. Given the size of her L occipital stroke eliquis will need to be held again to reduce the risk of hemorrhagic conversion. Plan to hold anticoagulation for total of another 3 days (would prefer 5, but given that she keeps embolizing, 3 days seems to be optimal risk/benefit time period). She will need to be rescanned with noncon head CT prior to restarting anticoagulation to r/o interval hemorrhagic conversion.  ? ?- Hold anticoagulation with tentative plan to restart Friday after noncon head CT r/o interval hemorrhagic conversion. Begin with heparin gtt no bolus, recheck platelets tmrw AM  ?- ASA $Remo'81mg'Alsmg$  daily while eliquis is held, stop aspirin when eliquis is restarted ?- Goal normotension, strict avoidance of hypotension ?- STAT head CT for any change in exam ?- PT/OT/SLP ? ?Per daughter-in-law's request I will call her with update tmrw Moses Manners [phone] ? ?Su Monks, MD ?[facility] ?[phone] ? ?If 7pm- 7am, please page neurology on call as listed in [place]. ?

## 2021-07-24 NOTE — Progress Notes (Signed)
Physical Therapy Treatment ?Patient Details ?Name: Natasha [name] ?MRN: [id number] ?DOB: January 02, 1941 ?Today's Date: 07/24/2021 ? ? ?History of Present Illness 81 y.o. female with history of atrial fibrillation on Eliquis, CHF, hypertension, hyperlipidemia, diabetes, COPD, chronic kidney disease, lung CA, depression, and recent R carpal tunnel release (Jul 15, 2021), who presents to the emergency department with complaints of vertigo. 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 ? ?  ?PT Comments  ? ? Pt awake and sitting EOB brushing teeth w/ OT upon PT entrance into room for session today. Pt required maxAx2 for sit to stand and to step pivot transfer to recliner. Once seated in recliner she was able to perform seated LE exercises: Leg Extension x10 ea side, Hip Flexion x10 ea side, and SLR x10 ea side. She required AROM and AAROM intermittently throughout the seated LE exercises. Pt will benefit from continued skilled PT in order to increase LE strength, improve mobility/gait, and restore PLOF. Current discharge recommendation to SNF remains appropriate due to the level of assistance required by the patient to ensure safety and improve overall function. ?  ?Recommendations for follow up therapy are one component of a multi-disciplinary discharge planning process, led by the attending physician.  Recommendations may be updated based on patient status, additional functional criteria and insurance authorization. ? ?Follow Up Recommendations ? Skilled nursing-short term rehab (<3 hours/day) ?  ?  ?Assistance Recommended at Discharge Frequent or constant Supervision/Assistance  ?Patient can return home with the following Help with stairs or ramp for entrance;Assist for transportation;Assistance with cooking/housework;Two people to help with walking and/or transfers;Direct  supervision/assist for medications management;Direct supervision/assist for financial management;Two people to help with bathing/dressing/bathroom ?  ?Equipment Recommendations ? Rolling walker (2 wheels)  ?  ?Recommendations for Other Services   ? ? ?  ?Precautions / Restrictions Precautions ?Precautions: Fall ?Precaution Comments: recent carpal tunnel sx R UE ?Restrictions ?Weight Bearing Restrictions: No  ?  ? ?Mobility ? Bed Mobility ?Overal bed mobility: Needs Assistance ?  ?  ?  ?  ?  ?  ?  ?  ? ?Transfers ?Overall transfer level: Needs assistance ?  ?Transfers: Sit to/from Stand, Bed to chair/wheelchair/BSC ?Sit to Stand: Max assist, +2 physical assistance ?  ?Step pivot transfers: Max assist, +2 physical assistance ?  ?  ?  ?  ?  ? ?Ambulation/Gait ?  ?  ?  ?  ?  ?  ?  ?  ? ? ?Stairs ?  ?  ?  ?  ?  ? ? ?Wheelchair Mobility ?  ? ?Modified Rankin (Stroke Patients Only) ?  ? ? ?  ?Balance Overall balance assessment: Needs assistance ?Sitting-balance support: Feet supported, Bilateral upper extremity supported ?Sitting balance-Leahy Scale: Poor ?Sitting balance - Comments: MINA decreasing to MOD A as pt fatigues ?Postural control: Left lateral lean ?Standing balance support: Single extremity supported ?Standing balance-Leahy Scale: Zero ?Standing balance comment: heavy L lateral lean, does not achieve upright posture ?  ?  ?  ?  ?  ?  ?  ?  ?  ?  ?  ?  ? ?  ?Cognition Arousal/Alertness: Awake/alert ?Behavior During Therapy: Flat affect ?Overall Cognitive Status: Impaired/Different from baseline ?  ?  ?  ?  ?  ?  ?  ?  ?  ?  ?  ?  ?  ?  ?  ?  ?  General Comments: follows 1 step commands inconsistently, engages appropriately in conversation ?  ?  ? ?  ?Exercises General Exercises - Lower Extremity ?Long Arc Quad: AROM, Both, 10 reps ?Straight Leg Raises: AAROM, Both, 10 reps ?Hip Flexion/Marching: AROM, Both, 10 reps ? ?  ?General Comments   ?  ?  ? ?Pertinent Vitals/Pain Pain Assessment ?Pain Assessment:  Faces ?Faces Pain Scale: Hurts even more ?Pain Location: b/l shoulders ?Pain Descriptors / Indicators: Discomfort, Guarding, Grimacing ?Pain Intervention(s): Limited activity within patient's tolerance, Repositioned  ? ? ?Home Living   ?  ?  ?  ?  ?  ?  ?  ?  ?  ?   ?  ?Prior Function    ?  ?  ?   ? ?PT Goals (current goals can now be found in the care plan section) Progress towards PT goals: Progressing toward goals ? ?  ?Frequency ? ? ? 7X/week ? ? ? ?  ?PT Plan Current plan remains appropriate  ? ? ?Co-evaluation PT/OT/SLP Co-Evaluation/Treatment: Yes ?Reason for Co-Treatment: Complexity of the patient's impairments (multi-system involvement);For patient/therapist safety;To address functional/ADL transfers ?PT goals addressed during session: Mobility/safety with mobility;Balance ?OT goals addressed during session: ADL's and self-care;Strengthening/ROM ?  ? ?  ?AM-PAC PT "6 Clicks" Mobility   ?Outcome Measure ? Help needed turning from your back to your side while in a flat bed without using bedrails?: A Lot ?Help needed moving from lying on your back to sitting on the side of a flat bed without using bedrails?: A Lot ?Help needed moving to and from a bed to a chair (including a wheelchair)?: Total ?Help needed standing up from a chair using your arms (e.g., wheelchair or bedside chair)?: Total ?Help needed to walk in hospital room?: Total ?Help needed climbing 3-5 steps with a railing? : Total ?6 Click Score: 8 ? ?  ?End of Session Equipment Utilized During Treatment: Gait belt ?Activity Tolerance: Patient limited by fatigue;Patient limited by pain ?Patient left: in chair;with chair alarm set;with call bell/phone within reach;with family/visitor present ?Nurse Communication: Mobility status ?PT Visit Diagnosis: Unsteadiness on feet (R26.81);Other abnormalities of gait and mobility (R26.89);Difficulty in walking, not elsewhere classified (R26.2) ?  ? ? ?Time: 9753-0051 ?PT Time Calculation (min) (ACUTE ONLY): 19  min ? ?Charges:             ?          ? ? ?Jonnie Kind, SPT ?07/24/2021, 1:19 PM ? ?

## 2021-07-24 NOTE — Progress Notes (Signed)
Reached out to Dr. Rudene Christians office , [phone], regarding follow up for patient's carpal tunnel surgery.  Patient has sutures that need to be addressed.  Message left with secretary and their nurse will call back.   ?

## 2021-07-24 NOTE — Progress Notes (Signed)
Request for Omniseq on left neck lymph node faxed to pathology to send out.  ?

## 2021-07-24 NOTE — Progress Notes (Signed)
PROGRESS NOTE  Natasha [name]    DOB: [birth year], 81 y.o.  PRX:[id number]    Code Status: Full Code   DOA: 07/17/2021   LOS: 6   Brief hospital course  Natasha [name] is a 81 y.o. female with a PMH significant for A-fib on Eliquis, HTN, HLD, type II DM, COPD, asthma, GERD, depression, former smoker, RLS, OSA, CKD 4, CHF, H/o lung cancer s/p radiation therapy, IDA, chronic pain. They presented from home to the ED on 07/17/2021 with dizziness upon waking up on 2/23 and was her normal self when went to bed that previous night. She had worsening of the symptoms and included left leg weakness.  Of note, pt is typically on chronic anticoagulation via Eliquis, however, this medication was held for scheduled right-sided carpal tunnel release, which occurred on 07/15/2021. In the ED, it was found that they had 2 small right cerebellar infarcts on head CT.  This was followed by brain MRI which showed numerous small acute infarcts scattered in the brain and compatible with central embolic disease.  Additionally, this imaging showed multiple bone lesions that were not previously seen on recent head imaging in 02/2021 and are consistent with metastatic disease. Neurology was consulted for further stroke evaluation and treatment.  They were treated with continued holding of Eliquis, permissive hypertension, PT/OT consult.  2/27- lymph node biopsy 2/28- patient had recurrent stroke symptoms and new lesions seen on repeat head CT.  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  07/24/21 -stable  Assessment & Plan  Principal Problem:   Acute embolic stroke [facility]) Active Problems:   Essential hypertension   Iron deficiency anemia   OSA (obstructive sleep apnea)   Paroxysmal atrial fibrillation (HCC)   Restless leg syndrome   CKD (chronic kidney disease) stage 4, GFR 15-29 ml/min (HCC)   HLD (hyperlipidemia)   Type II diabetes mellitus with renal  manifestations (HCC)   COPD (chronic obstructive pulmonary disease) (HCC)   Chronic diastolic CHF (congestive heart failure) (HCC)   Leukocytosis   Bone lesion   Lung cancer ([place])   Carpal tunnel syndrome on right   Lesion of liver   CVA (cerebral vascular accident) ([place])   Anemia   Thrombocytopenia (HCC)   Pressure injury of skin  CVA-recurrence of new lesions and symptoms 2/28.  Pattern is significant for embolic disease in setting of held anticoagulations for recent surgery. -Neurology following, appreciate recommendations -Continue to hold anticoagulation until 3/3 to avoid conversion to hemorrhagic stroke -Repeat head imaging prior to restarting anticoagulation -Continue ASA 81 mg until Eliquis restarted -Permissive hypertension, avoid hypotension -PT/OT/SLP -Stat head CT for any new emerging symptoms  New incidental finding of bone lesions seen on head/C-spine imaging suspicious of metastatic disease potentially related to history of lung cancer s/p radiation therapy.  S/p lymph node biopsy 2/27 - heme/onc following, appreciate recommendations  - f/u outpatient -f/u biopsy results  HTN-permissive hypertension -Titrate back on home blood pressure medications as able or indicated  PAF-Eliquis was restarted on 2/28 after being held for her recent carpal tunnel release surgery.  Rate controlled -Continue amiodarone  AKI on CKD 4-gradual improvement Cr 2.2>2.08 -Continue IV fluids today  Type II DM   HLD -Continue statin -Continue sliding scale -Continue Semglee 10 units daily  COPD-chronic, stable -As needed bronchodilators  HFpEF-holding diuretics  Restless leg syndrome -Continue home medication  OSA -Continue CPAP nightly  IDA -Continue iron supplement  Carpal tunnel syndrome on right s/p release on 2/21 -  Consult Ortho for suture removal  Depression- chronic, stable -Continue home sertraline  Body mass index is 23.88 kg/m.  VTE ppx: Place and  maintain sequential compression device Start: 07/18/21 0658 SCDs Start: 07/17/21 0525   Diet:     Diet   Diet Carb Modified Fluid consistency: Thin; Room service appropriate? Yes with Assist   Subjective 07/24/21    Pt reports feeling pain all over. Nothing specific but more chronic.    Objective   Vitals:   07/23/21 1544 07/23/21 2001 07/24/21 0020 07/24/21 0427  BP: (!) 101/38 (!) 108/41 (!) 97/32 (!) 136/52  Pulse: 71 72 72 73  Resp: $Remo'18 17 19 17  'PLqna$ Temp: 97.7 F (36.5 C) 98 F (36.7 C) 98.2 F (36.8 C) 98.2 F (36.8 C)  TempSrc: Oral     SpO2: 100% 97% 95% 96%  Weight:      Height:        Intake/Output Summary (Last 24 hours) at 07/24/2021 0277 Last data filed at 07/23/2021 1556 Gross per 24 hour  Intake 140 ml  Output 600 ml  Net -460 ml    Filed Weights   07/19/21 0500 07/20/21 0500 07/23/21 0519  Weight: 61.4 kg 61 kg 63.1 kg     Physical Exam:  General: awake, alert, NAD HEENT: atraumatic, clear conjunctiva, anicteric sclera, MMM, hearing grossly normal Respiratory: normal respiratory effort. Cardiovascular: quick capillary refill  Nervous: A&O x3. Moving all extremities, normal speech Extremities: moves all equally, no edema, normal tone Skin: dry, intact, normal temperature, normal color. No rashes, lesions or ulcers on exposed skin Psychiatry: normal mood, congruent affect  Labs   I have personally reviewed the following labs and imaging studies CBC    Component Value Date/Time   WBC 10.7 (H) 07/23/2021 0558   RBC 3.40 (L) 07/23/2021 0558   HGB 9.4 (L) 07/23/2021 0558   HCT 29.9 (L) 07/23/2021 0558   PLT 70 (L) 07/23/2021 0558   MCV 87.9 07/23/2021 0558   MCH 27.6 07/23/2021 0558   MCHC 31.4 07/23/2021 0558   RDW 16.9 (H) 07/23/2021 0558   LYMPHSABS 1.3 06/16/2021 1039   MONOABS 0.6 06/16/2021 1039   EOSABS 0.1 06/16/2021 1039   BASOSABS 0.0 06/16/2021 1039   BMP Latest Ref Rng & Units 07/24/2021 07/23/2021 07/22/2021  Glucose 70 - 99 mg/dL  224(H) 224(H) 238(H)  BUN 8 - 23 mg/dL 60(H) 70(H) 66(H)  Creatinine 0.44 - 1.00 mg/dL 1.56(H) 2.08(H) 2.24(H)  BUN/Creat Ratio 6 - 22 (calc) - - -  Sodium 135 - 145 mmol/L 135 134(L) 135  Potassium 3.5 - 5.1 mmol/L 3.9 4.1 4.4  Chloride 98 - 111 mmol/L 107 104 104  CO2 22 - 32 mmol/L 19(L) 19(L) 17(L)  Calcium 8.9 - 10.3 mg/dL 8.2(L) 8.0(L) 8.4(L)    MR BRAIN WO CONTRAST  Result Date: 07/22/2021 CLINICAL DATA:  Acute neuro deficit. Stroke. Now with bilateral leg weakness and diplopia. EXAM: MRI HEAD WITHOUT CONTRAST TECHNIQUE: Multiplanar, multiecho pulse sequences of the brain and surrounding structures were obtained without intravenous contrast. COMPARISON:  MRI head 07/17/2021 FINDINGS: Brain: Numerous areas of acute infarct are present compatible with emboli. Multiple small infarcts in the cerebellum bilaterally are stable. Progressive acute infarct in the left occipital pole with mild associated petechial hemorrhage which was seen previously. Multiple small areas of acute infarct in the frontal and parietal lobes bilaterally and in the white matter. Progression of cluster of acute infarcts in the right parietal white matter and also in the right  medial parietal cortex. Small acute infarct head of caudate on the left unchanged. Small acute infarct head of caudate on the right is new. Ventricle size normal.  No mass or midline shift. Vascular: Normal arterial flow voids. Skull and upper cervical spine: Bilateral calvarial lesions are again noted. These are suspicious for metastatic disease. Sinuses/Orbits: Paranasal sinuses clear. Bilateral cataract extraction Other: None IMPRESSION: Numerous areas of acute infarct in the cerebrum and cerebellum bilaterally compatible with acute embolic infarction. There has been progression of acute infarcts since the recent MRI of 07/17/2021. Findings suggest recurrent emboli. Progression of infarct left occipital pole. Petechial hemorrhage in this area unchanged  from the prior study. No other hemorrhage. Lesions in the calvarium bilaterally, suspicious for metastatic disease. Electronically Signed   By: Franchot Gallo M.D.   On: 07/22/2021 14:59    Disposition Plan & Communication  Patient status: Inpatient  Admitted From: Home Planned disposition location: Skilled nursing facility Anticipated discharge date: 3/6 pending no new stroke symptoms and tolerating anticoagulation, neuro clearance, and not needing oncology inpatient workup  Family Communication: heme/onc and neuro to family    Author: Richarda Osmond, DO [facility] 07/24/2021, 7:12 AM   Available by Epic secure chat 7AM-7PM. If 7PM-7AM, please contact night-coverage.  [facility] contact information found on [email or web address].

## 2021-07-24 NOTE — Plan of Care (Signed)
?  Problem: Education: ?Goal: Knowledge of General Education information will improve ?Description: Including pain rating scale, medication(s)/side effects and non-pharmacologic comfort measures ?Outcome: Progressing ?  ?Problem: Health Behavior/Discharge Planning: ?Goal: Ability to manage health-related needs will improve ?Outcome: Progressing ?  ?Problem: Clinical Measurements: ?Goal: Ability to maintain clinical measurements within normal limits will improve ?Outcome: Progressing ?Goal: Will remain free from infection ?Outcome: Progressing ?Goal: Diagnostic test results will improve ?Outcome: Progressing ?Goal: Respiratory complications will improve ?Outcome: Progressing ?Goal: Cardiovascular complication will be avoided ?Outcome: Progressing ?  ?Problem: Activity: ?Goal: Risk for activity intolerance will decrease ?Outcome: Progressing ?  ?Problem: Nutrition: ?Goal: Adequate nutrition will be maintained ?Outcome: Progressing ?  ?Problem: Coping: ?Goal: Level of anxiety will decrease ?Outcome: Progressing ?  ?Problem: Elimination: ?Goal: Will not experience complications related to bowel motility ?Outcome: Progressing ?Goal: Will not experience complications related to urinary retention ?Outcome: Progressing ?  ?Problem: Pain Managment: ?Goal: General experience of comfort will improve ?Outcome: Progressing ?  ?Problem: Safety: ?Goal: Ability to remain free from injury will improve ?Outcome: Progressing ?  ?Problem: Skin Integrity: ?Goal: Risk for impaired skin integrity will decrease ?Outcome: Progressing ?  ?Problem: Education: ?Goal: Knowledge of disease or condition will improve ?Outcome: Progressing ?Goal: Knowledge of secondary prevention will improve (SELECT ALL) ?Outcome: Progressing ?Goal: Knowledge of patient specific risk factors will improve (INDIVIDUALIZE FOR PATIENT) ?Outcome: Progressing ?  ?Problem: Coping: ?Goal: Will identify appropriate support needs ?Outcome: Progressing ?  ?Problem: Health  Behavior/Discharge Planning: ?Goal: Ability to manage health-related needs will improve ?Outcome: Progressing ?  ?Problem: Self-Care: ?Goal: Ability to participate in self-care as condition permits will improve ?Outcome: Progressing ?Goal: Ability to communicate needs accurately will improve ?Outcome: Progressing ?  ?Problem: Nutrition: ?Goal: Risk of aspiration will decrease ?Outcome: Progressing ?  ?Problem: Ischemic Stroke/TIA Tissue Perfusion: ?Goal: Complications of ischemic stroke/TIA will be minimized ?Outcome: Progressing ?  ?

## 2021-07-24 NOTE — Progress Notes (Signed)
Occupational Therapy Treatment ?Patient Details ?Name: Natasha [name] ?MRN: [id number] ?DOB: Jun 14, 1940 ?Today's Date: 07/24/2021 ? ? ?History of present illness 81 y.o. female with history of atrial fibrillation on Eliquis, CHF, hypertension, hyperlipidemia, diabetes, COPD, chronic kidney disease, lung CA, depression, and recent R carpal tunnel release (Jul 15, 2021), who presents to the emergency department with complaints of vertigo. 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 ?  ?OT comments ? Natasha [name] was seen for OT treatment, overlapping with PT for safe mobility. Upon arrival to room pt reclined in bed, family at bedside, pt agreeable and requesting to brush teeth. Pt requires MOD A tooth brushing sitting EOB, assist for SETUP and sitting balance. Attempted to use hand over hand assist for LUE gross assist opening toothpaste however pt uses R hand and teeth instead. MAX A x2 for bed>chair stand pivot t/f from elevated bed height. Pt making good progress toward goals. Pt continues to benefit from skilled OT services to maximize return to PLOF and minimize risk of future falls, injury, caregiver burden, and readmission. Will continue to follow POC. Discharge recommendation remains appropriate.  ?  ? ?Recommendations for follow up therapy are one component of a multi-disciplinary discharge planning process, led by the attending physician.  Recommendations may be updated based on patient status, additional functional criteria and insurance authorization. ?   ?Follow Up Recommendations ? Skilled nursing-short term rehab (<3 hours/day)  ?  ?Assistance Recommended at Discharge Frequent or constant Supervision/Assistance  ?Patient can return home with the following ? Two people to help with walking and/or transfers;Two people to help with bathing/dressing/bathroom;Help  with stairs or ramp for entrance ?  ?Equipment Recommendations ? Other (comment) (defer)  ?  ?Recommendations for Other Services   ? ?  ?Precautions / Restrictions Precautions ?Precautions: Fall ?Precaution Comments: recent carpal tunnel sx R UE ?Restrictions ?Weight Bearing Restrictions: No  ? ? ?  ? ?Mobility Bed Mobility ?Overal bed mobility: Needs Assistance ?Bed Mobility: Supine to Sit ?  ?  ?Supine to sit: Total assist, HOB elevated ?  ?  ?  ?  ? ?Transfers ?Overall transfer level: Needs assistance ?  ?Transfers: Sit to/from Stand, Bed to chair/wheelchair/BSC ?Sit to Stand: Max assist, +2 physical assistance ?Stand pivot transfers: Max assist, +2 physical assistance ?  ?  ?  ?  ?  ?  ?  ?Balance Overall balance assessment: Needs assistance ?Sitting-balance support: Feet supported, Bilateral upper extremity supported ?Sitting balance-Leahy Scale: Fair ?  ?  ?Standing balance support: Single extremity supported ?Standing balance-Leahy Scale: Zero ?Standing balance comment: heavy L lateral lean, does not achieve upright posture ?  ?  ?  ?  ?  ?  ?  ?  ?  ?  ?  ?   ? ?ADL either performed or assessed with clinical judgement  ? ?ADL Overall ADL's : Needs assistance/impaired ?  ?  ?  ?  ?  ?  ?  ?  ?  ?  ?  ?  ?  ?  ?  ?  ?  ?  ?  ?General ADL Comments: MOD A tooth brushing sitting EOB, assist for SETUP and sitting balance. MAX A x2 for ADL t/f. ?  ? ? ? ?Cognition Arousal/Alertness: Awake/alert ?Behavior During Therapy: Flat affect ?Overall Cognitive Status: Impaired/Different from baseline ?  ?  ?  ?  ?  ?  ?  ?  ?  ?  ?  ?  ?  ?  ?  ?  ?  General Comments: follows 1 step commands inconsistently, engages appropriately in conversation ?  ?  ?   ?   ?   ?   ? ? ?Pertinent Vitals/ Pain       Pain Assessment ?Pain Assessment: Faces ?Faces Pain Scale: Hurts even more ?Pain Location: b/l shoulders ?Pain Descriptors / Indicators: Discomfort, Guarding, Grimacing ?Pain Intervention(s): Limited activity within patient's  tolerance, Repositioned ? ? ?Frequency ? Min 4X/week  ? ? ? ? ?  ?Progress Toward Goals ? ?OT Goals(current goals can now be found in the care plan section) ? Progress towards OT goals: Progressing toward goals ? ?Acute Rehab OT Goals ?Patient Stated Goal: to eat lunch in chair ?OT Goal Formulation: With patient/family ?Time For Goal Achievement: 08/06/21 ?Potential to Achieve Goals: Good ?ADL Goals ?Pt Will Perform Grooming: standing;with min assist ?Pt Will Perform Lower Body Dressing: with min assist;sit to/from stand ?Pt Will Transfer to Toilet: ambulating;bedside commode;with min assist  ?Plan Discharge plan remains appropriate;Frequency remains appropriate   ? ?Co-evaluation ? ? ? PT/OT/SLP Co-Evaluation/Treatment: Yes ?Reason for Co-Treatment: Complexity of the patient's impairments (multi-system involvement);For patient/therapist safety;To address functional/ADL transfers ?PT goals addressed during session: Mobility/safety with mobility;Balance ?OT goals addressed during session: ADL's and self-care;Strengthening/ROM ?  ? ?  ?AM-PAC OT "6 Clicks" Daily Activity     ?Outcome Measure ? ? Help from another person eating meals?: A Little ?Help from another person taking care of personal grooming?: A Lot ?Help from another person toileting, which includes using toliet, bedpan, or urinal?: A Lot ?Help from another person bathing (including washing, rinsing, drying)?: A Lot ?Help from another person to put on and taking off regular upper body clothing?: A Little ?Help from another person to put on and taking off regular lower body clothing?: A Lot ?6 Click Score: 14 ? ?  ?End of Session   ? ?OT Visit Diagnosis: Other abnormalities of gait and mobility (R26.89);Muscle weakness (generalized) (M62.81) ?  ?Activity Tolerance Patient limited by pain;Patient tolerated treatment well ?  ?Patient Left in chair;with call bell/phone within reach;with chair alarm set;with family/visitor present ?  ?Nurse Communication  Mobility status ?  ? ?   ? ?Time: 2409-7353 ?OT Time Calculation (min): 20 min ? ?Charges: OT General Charges ?$OT Visit: 1 Visit ?OT Treatments ?$Self Care/Home Management : 8-22 mins ? ?Dessie Coma, M.S. OTR/L  ?07/24/21, 1:29 PM  ?ascom [phone] ? ?

## 2021-07-24 NOTE — Progress Notes (Addendum)
?   07/24/21 1500  ?Clinical Encounter Type  ?Visited With Family  ?Visit Type Initial;Social support  ?Referral From Nurse  ?Consult/Referral To Chaplain  ?Spiritual Encounters  ?Spiritual Needs Other (Comment) ?(Advanced Directive)  ? ?Chaplain Burris offered emotional support and assistance with preparing an advanced directive. Pt was sleeping soundly at time of visit. Reviewed document with Pt's spouse, Marlou Sa, and grandson (?), Ovid Curd. Chaplain Burris invited family to discuss events of this week, how Pt's stroke has impacted them, and also inquired about their well-being, urging ongoing self-care. ? ?Chaplain B also engaged Pt's spouse as he shared some thoughts regarding EOL planning. Chaplain B invited further conversation about what has been meaningful for this couple over their 24yrs together and learned that for a time they had raised rare and wild birds. Purpose was to foster meaning-making and to consider their values as part of how their lives might be celebrated, particularly because lack of a specific faith orientation seemed to leave uncertainty. Chaplain B encouraged ways to foster story telling, memory sharing, and gathering people that fits with their unique needs and values. Purpose was to remind family that lack of faith need not lead to minimization of ritual ways of closure. ? ?Daryel November offered ongoing pastoral support as needed and assured them of f/u to address AD on 3/3. ?

## 2021-07-25 ENCOUNTER — Inpatient Hospital Stay: Payer: Medicare Other

## 2021-07-25 DIAGNOSIS — M899 Disorder of bone, unspecified: Secondary | ICD-10-CM | POA: Diagnosis not present

## 2021-07-25 DIAGNOSIS — G5601 Carpal tunnel syndrome, right upper limb: Secondary | ICD-10-CM | POA: Diagnosis not present

## 2021-07-25 DIAGNOSIS — C7931 Secondary malignant neoplasm of brain: Secondary | ICD-10-CM | POA: Diagnosis not present

## 2021-07-25 DIAGNOSIS — I6350 Cerebral infarction due to unspecified occlusion or stenosis of unspecified cerebral artery: Principal | ICD-10-CM | POA: Diagnosis present

## 2021-07-25 DIAGNOSIS — D696 Thrombocytopenia, unspecified: Secondary | ICD-10-CM | POA: Diagnosis not present

## 2021-07-25 DIAGNOSIS — I639 Cerebral infarction, unspecified: Secondary | ICD-10-CM | POA: Diagnosis not present

## 2021-07-25 DIAGNOSIS — C3490 Malignant neoplasm of unspecified part of unspecified bronchus or lung: Secondary | ICD-10-CM

## 2021-07-25 LAB — CBC
HCT: 27.5 % — ABNORMAL LOW (ref 36.0–46.0)
Hemoglobin: 8.8 g/dL — ABNORMAL LOW (ref 12.0–15.0)
MCH: 27.8 pg (ref 26.0–34.0)
MCHC: 32 g/dL (ref 30.0–36.0)
MCV: 86.8 fL (ref 80.0–100.0)
Platelets: 86 10*3/uL — ABNORMAL LOW (ref 150–400)
RBC: 3.17 MIL/uL — ABNORMAL LOW (ref 3.87–5.11)
RDW: 16.9 % — ABNORMAL HIGH (ref 11.5–15.5)
WBC: 10.2 10*3/uL (ref 4.0–10.5)
nRBC: 0 % (ref 0.0–0.2)

## 2021-07-25 LAB — URINE CULTURE: Culture: >=100000 — AB

## 2021-07-25 LAB — GLUCOSE, CAPILLARY
Glucose-Capillary: 171 mg/dL — ABNORMAL HIGH (ref 70–99)
Glucose-Capillary: 291 mg/dL — ABNORMAL HIGH (ref 70–99)
Glucose-Capillary: 225 mg/dL — ABNORMAL HIGH (ref 70–99)
Glucose-Capillary: 187 mg/dL — ABNORMAL HIGH (ref 70–99)

## 2021-07-25 LAB — HEPARIN LEVEL (UNFRACTIONATED): Heparin Unfractionated: 0.39 IU/mL (ref 0.30–0.70)

## 2021-07-25 LAB — APTT
aPTT: 39 seconds — ABNORMAL HIGH (ref 24–36)
aPTT: 101 seconds — ABNORMAL HIGH (ref 24–36)

## 2021-07-25 MED ORDER — ASCORBIC ACID 500 MG PO TABS
500.0000 mg | ORAL_TABLET | Freq: Two times a day (BID) | ORAL | Status: DC
  Administered 2021-07-25 – 2021-07-31 (×11): 500 mg via ORAL
  Filled 2021-07-25 (×12): qty 1

## 2021-07-25 MED ORDER — ADULT MULTIVITAMIN W/MINERALS CH
1.0000 | ORAL_TABLET | Freq: Every day | ORAL | Status: DC
  Administered 2021-07-26 – 2021-07-31 (×5): 1 via ORAL
  Filled 2021-07-25 (×6): qty 1

## 2021-07-25 MED ORDER — HEPARIN (PORCINE) 25000 UT/250ML-% IV SOLN
1200.0000 [IU]/h | INTRAVENOUS | Status: DC
  Administered 2021-07-25: 13:00:00 950 [IU]/h via INTRAVENOUS
  Administered 2021-07-26: 1100 [IU]/h via INTRAVENOUS
  Filled 2021-07-25 (×2): qty 250

## 2021-07-25 MED ORDER — ZINC SULFATE 220 (50 ZN) MG PO CAPS
220.0000 mg | ORAL_CAPSULE | Freq: Every day | ORAL | Status: DC
  Administered 2021-07-26 – 2021-07-31 (×5): 220 mg via ORAL
  Filled 2021-07-25 (×6): qty 1

## 2021-07-25 MED ORDER — GLUCERNA SHAKE PO LIQD
237.0000 mL | Freq: Three times a day (TID) | ORAL | Status: DC
  Administered 2021-07-25 – 2021-07-28 (×7): 237 mL via ORAL

## 2021-07-25 MED ORDER — APIXABAN 2.5 MG PO TABS: 2.5000 mg | ORAL_TABLET | Freq: Two times a day (BID) | ORAL | Status: DC

## 2021-07-25 NOTE — Progress Notes (Signed)
S: No new deficits or complaints today. Head CT showed no interval hemorrhagic conversion. Platelets up to 87 today from 38 yesterday. ? ?O: ? ?Vitals:  ? 07/25/21 0451 07/25/21 0932  ?BP: (!) 131/47 (!) 119/42  ?Pulse: 70 73  ?Resp: 18 17  ?Temp: 98 ?F (36.7 ?C) 98.1 ?F (36.7 ?C)  ?SpO2: 95% 96%  ? ? ?Physical Exam ?Gen: A&Ox4, NAD ?HEENT: Atraumatic, normocephalic; oropharynx clear, tongue without atrophy or fasciculations. ?Resp: CTAB, normal work of breathing ?CV: RRR, extremities appear well-perfused. ?Abd: soft/NT/ND ?Extrem: Nml bulk; no cyanosis, clubbing, or edema. ? ?Neuro: ?*MS: A&O x4. Follows multi-step commands.  ?*Speech: no dysarthria or aphasia, able to name and repeat. ?*CN:  ?  I: Deferred ?  II,III: PERRLA, RHH, optic discs not visualized 2/2 pupillary constriction ?  III,IV,VI: EOMI w/o nystagmus, no ptosis ?  V: Sensation intact from V1 to V3 to LT ?  VII: Eyelid closure was full.  L UMN facial droop ?  VIII: Hearing intact to voice ?  IX,X: Voice normal, palate elevates symmetrically  ?  XI: SCM/trap 5/5 bilat   ?XII: Tongue protrudes midline, no atrophy or fasciculations  ?*Motor:   Normal bulk.  No tremor, rigidity or bradykinesia. No pronator drift. RUE full strength throughout, LUE 4/5 diffusely with drift but not to bed, able to wiggle toes BLE but no movement anti-gravity ?*Sensory: SILT ?*Coordination:  ataxia L FNF ?*Reflexes:  1+ and symmetric throughout without clonus; toes down-going bilat ?*Gait: deferred ? ?NIHSS ?1a Level of Conscious.: 0 ?1b LOC Questions: 0 ?1c LOC Commands: 0 ?2 Best Gaze: 0 ?3 Visual: 1 ?4 Facial Palsy: 1 ?5a Motor Arm - left: 1 ?5b Motor Arm - Right: 0 ?6a Motor Leg - Left: 2 ?6b Motor Leg - Right: 2 ?7 Limb Ataxia: 1 ?8 Sensory: 0 ?9 Best Language: 0 ?10 Dysarthria: 0 ?11 Extinct. and Inatten.: 0 ? ?TOTAL: 8 ? ?A/P: 81 year old female with a history of atrial flutter who is on anticoagulation, but this had been held due to surgery with strokes consistent  with emboli in the setting of holding Eliquis. Her eliquis was held to reduce the risk of hemorrhagic conversion but on 2/28 this week she again had recurrent infarcts resulting in [facility] and BLE weakness (now improving). Her anticoagulation was held for 3 more days (typically would wait 5 but given that she had recurrent embolizations 3 days was felt to represent the optimal risk/benefit time period). Head CT 3/3 showed no interval hemorrhagic conversion and platelets improved to 87 therefore she was restarted on anticoagulation with heparin gtt no bolus to be continued through the weekend after which she will be transitioned to eliquis 2.$RemoveBefo'5mg'DeqSSYaJbzb$  bid. ? ?- Continue heparin gtt no bolus ?- Stop aspirin ?- Plan to transition to eliquis 2.$RemoveBefo'5mg'iBPvdUKwqfm$  bid on monday ?- Goal normotension, strict avoidance of hypotension ?- STAT head CT for any change in exam ?- PT/OT/SLP ? ?Su Monks, MD ?[facility] ?[phone] ? ?If 7pm- 7am, please page neurology on call as listed in [place]. ? ?

## 2021-07-25 NOTE — Progress Notes (Addendum)
PROGRESS NOTE  Natasha [name]    DOB: [birth year], 81 y.o.  ZYS:[id number]    Code Status: Full Code   DOA: 07/17/2021   LOS: 7   Brief hospital course  [name] is a 81 y.o. female with a PMH significant for A-fib on Eliquis, HTN, HLD, type II DM, COPD, asthma, GERD, depression, former smoker, RLS, OSA, CKD 4, CHF, H/o lung cancer s/p radiation therapy, IDA, chronic pain. They presented from home to the ED on 07/17/2021 with dizziness upon waking up on 2/23 and was her normal self when went to bed that previous night. She had worsening of the symptoms and included left leg weakness.  Of note, pt is typically on chronic anticoagulation via Eliquis, however, this medication was held for scheduled right-sided carpal tunnel release, which occurred on 07/15/2021. In the ED, it was found that they had 2 small right cerebellar infarcts on head CT.  This was followed by brain MRI which showed numerous small acute infarcts scattered in the brain and compatible with central embolic disease.  Additionally, this imaging showed multiple bone lesions that were not previously seen on recent head imaging in 02/2021 and are consistent with metastatic disease. Neurology was consulted for further stroke evaluation and treatment.  They were treated with continued holding of Eliquis, permissive hypertension, PT/OT consult.  2/27- lymph node biopsy 2/28- patient had recurrent stroke symptoms and new lesions seen on repeat head CT.  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  07/25/21 -stable  Assessment & Plan  Principal Problem:   Acute embolic stroke [facility]) Active Problems:   Essential hypertension   Iron deficiency anemia   OSA (obstructive sleep apnea)   Paroxysmal atrial fibrillation (HCC)   Restless leg syndrome   CKD (chronic kidney disease) stage 4, GFR 15-29 ml/min (HCC)   HLD (hyperlipidemia)   Type II diabetes mellitus with renal  manifestations (HCC)   COPD (chronic obstructive pulmonary disease) (HCC)   Chronic diastolic CHF (congestive heart failure) (HCC)   Leukocytosis   Bone lesion   Lung cancer ([place])   Carpal tunnel syndrome on right   Lesion of liver   CVA (cerebral vascular accident) ([place])   Anemia   Thrombocytopenia ([place])   Pressure injury of skin  CVA-recurrence of new lesions and symptoms 2/28.  Pattern is significant for embolic disease in setting of held anticoagulations for recent surgery. Patient denies any new symptoms since 2/28. Repeat head CT today to monitor progression of the lesions was negative for hemorrhage or mass effect or new lesions.  - restart eliquis half dose today  - age >18, serum Cr >1.5. her weight is also borderline at 63kg - dc aspirin -Neurology following, appreciate recommendations -Permissive hypertension, avoid hypotension -PT/OT/SLP -Stat head CT for any new emerging symptoms - monitor for new symptoms and/or bleeding over weekend and will dc to SNF Monday if remains stable  New incidental finding of bone lesions seen on head/C-spine imaging suspicious of metastatic disease- history of lung cancer s/p radiation therapy.  S/p lymph node biopsy 2/27 consistent with metastatic adenocarcinoma from lung primary - heme/onc following, appreciate recommendations  - f/u outpatient  HTN-permissive hypertension, patient's Bps have had low diastolics so no antihypertensives added and discontinued opioid pain medications. -Titrate back on home blood pressure medications as able or indicated  PAF-Eliquis was restarted on 2/28 after being held for her recent carpal tunnel release surgery. Held when new stroke found and then restarted again 3/3. Rate  controlled.  -Continue amiodarone - continue eliquis at 2.$RemoveBefo'5mg'siEhApFyCvb$  BID  AKI (resolved) on CKD 4-gradual improvement Cr 2.2>2.08>1.56. appears to be close to baseline. -monitor  Type II DM   HLD- moderately well controlled -Continue  statin -Continue sliding scale -Continue Semglee 15 units daily  COPD-chronic, stable. ORA -As needed bronchodilators  HFpEF-holding diuretics  Restless leg syndrome -Continue home medication  OSA -Continue CPAP nightly  IDA   thrombocytopenia- platelets decreased and likely reactionary. Low risk of HIT.  131>>>>70>86 today - CBC am -Continue iron supplement  Carpal tunnel syndrome on right s/p release on 2/21. Ortho evaluated and removed sutures 3/2. - PT/OT  Depression- chronic, stable -Continue home sertraline  Body mass index is 23.88 kg/m.  VTE ppx: Place and maintain sequential compression device Start: 07/18/21 0658 SCDs Start: 07/17/21 0525   Diet:     Diet   Diet Carb Modified Fluid consistency: Thin; Room service appropriate? Yes with Assist   Subjective 07/25/21    Pt reports doing well today. Glad to be getting more activity during the day and is anxious to get home again   Objective   Vitals:   07/24/21 1544 07/24/21 1948 07/25/21 0035 07/25/21 0451  BP: (!) 121/49 (!) 120/50 (!) 117/48 (!) 131/47  Pulse: 68 73 70 70  Resp: $Remo'20 18 17 18  'cucvn$ Temp: 98 F (36.7 C) 98.1 F (36.7 C) 98 F (36.7 C) 98 F (36.7 C)  TempSrc:      SpO2: 97% 96% 99% 95%  Weight:      Height:        Intake/Output Summary (Last 24 hours) at 07/25/2021 0720 Last data filed at 07/25/2021 0631 Gross per 24 hour  Intake 240 ml  Output 1770 ml  Net -1530 ml    Filed Weights   07/19/21 0500 07/20/21 0500 07/23/21 0519  Weight: 61.4 kg 61 kg 63.1 kg     Physical Exam:  General: awake, alert, NAD HEENT: atraumatic, clear conjunctiva, anicteric sclera, MMM, hearing grossly normal Respiratory: normal respiratory effort. Cardiovascular: quick capillary refill  Nervous: A&O x3. Moving all extremities, normal speech Extremities: moves all equally, no edema, normal tone Skin: dry, intact, normal temperature, normal color. No rashes, lesions or ulcers on exposed  skin Psychiatry: normal mood, congruent affect  Labs   I have personally reviewed the following labs and imaging studies CBC    Component Value Date/Time   WBC 10.7 (H) 07/23/2021 0558   RBC 3.40 (L) 07/23/2021 0558   HGB 9.4 (L) 07/23/2021 0558   HCT 29.9 (L) 07/23/2021 0558   PLT 70 (L) 07/23/2021 0558   MCV 87.9 07/23/2021 0558   MCH 27.6 07/23/2021 0558   MCHC 31.4 07/23/2021 0558   RDW 16.9 (H) 07/23/2021 0558   LYMPHSABS 1.3 06/16/2021 1039   MONOABS 0.6 06/16/2021 1039   EOSABS 0.1 06/16/2021 1039   BASOSABS 0.0 06/16/2021 1039   BMP Latest Ref Rng & Units 07/24/2021 07/23/2021 07/22/2021  Glucose 70 - 99 mg/dL 224(H) 224(H) 238(H)  BUN 8 - 23 mg/dL 60(H) 70(H) 66(H)  Creatinine 0.44 - 1.00 mg/dL 1.56(H) 2.08(H) 2.24(H)  BUN/Creat Ratio 6 - 22 (calc) - - -  Sodium 135 - 145 mmol/L 135 134(L) 135  Potassium 3.5 - 5.1 mmol/L 3.9 4.1 4.4  Chloride 98 - 111 mmol/L 107 104 104  CO2 22 - 32 mmol/L 19(L) 19(L) 17(L)  Calcium 8.9 - 10.3 mg/dL 8.2(L) 8.0(L) 8.4(L)   CT HEAD WO CONTRAST (5MM)  Result Date: 07/25/2021  CLINICAL DATA:  81 year old female with recent neurologic deficit and embolic appearing infarcts scattered throughout the brain on 07/22/2021. EXAM: CT HEAD WITHOUT CONTRAST TECHNIQUE: Contiguous axial images were obtained from the base of the skull through the vertex without intravenous contrast. RADIATION DOSE REDUCTION: This exam was performed according to the departmental dose-optimization program which includes automated exposure control, adjustment of the mA and/or kV according to patient size and/or use of iterative reconstruction technique. COMPARISON:  Brain MRI 07/22/2021.  Head CT 07/17/2021. FINDINGS: Brain: Development of cytotoxic edema corresponding to the DWI positive infarcts on the recent MRI, most conspicuous in the left occipital pole (series 2, image 12), right centrum semiovale (image 18), and cerebellum (image 9). No associated hemorrhage. No mass effect.  Stable gray-white matter differentiation otherwise. No ventriculomegaly. Normal basilar cisterns. Vascular: Calcified atherosclerosis at the skull base. No suspicious intracranial vascular hyperdensity. Skull: Osteopenia.  No acute osseous abnormality identified. Sinuses/Orbits: Trace fluid layering in the left sphenoid sinus. Other Visualized paranasal sinuses and mastoids are clear. Other: No acute orbit or scalp soft tissue finding. IMPRESSION: 1. Expected CT appearance of the scattered bilateral infarcts on 07/22/2021 MRI. No associated hemorrhage or mass effect. 2. No new intracranial abnormality. Electronically Signed   By: Genevie Ann M.D.   On: 07/25/2021 08:56     Disposition Plan & Communication  Patient status: Inpatient  Admitted From: Home Planned disposition location: Skilled nursing facility Anticipated discharge date: 3/6 pending no new stroke symptoms and tolerating anticoagulation, neuro clearance, and not needing oncology inpatient workup  Family Communication: husband at bedside   Author: Richarda Osmond, DO [facility] 07/25/2021, 7:20 AM   Available by Epic secure chat 7AM-7PM. If 7PM-7AM, please contact night-coverage.  [facility] contact information found on [email or web address].

## 2021-07-25 NOTE — Progress Notes (Signed)
ANTICOAGULATION CONSULT NOTE - Initial Consult ? ?Pharmacy Consult for Heparin drip ?Indication: atrial fibrillation ? ?Allergies  ?Allergen Reactions  ? Atorvastatin   ?  Muscle/joint aches  ? Lantus [Insulin Glargine] Hives  ? Lyrica [Pregabalin] Other (See Comments)  ?  Headache, disorientation  ? Lisinopril Hives and Cough  ?   ?  ? ? ?Patient Measurements: ?Height: 5\' 4"  (162.6 cm) ?Weight: 63.1 kg (139 lb 1.8 oz) ?IBW/kg (Calculated) : 54.7 ?Heparin Dosing Weight:   ? ?Vital Signs: ?Temp: 98.1 ?F (36.7 ?C) (03/03 2045) ?Temp Source: Oral (03/03 2045) ?BP: 111/42 (03/03 2045) ?Pulse Rate: 80 (03/03 2045) ? ?Labs: ?Recent Labs  ?  07/23/21 ?09/22/21 07/24/21 ?0500 07/25/21 ?0747 07/25/21 ?1221 07/25/21 ?1224 07/25/21 ?2052  ?HGB 9.4*  --  8.8*  --   --   --   ?HCT 29.9*  --  27.5*  --   --   --   ?PLT 70*  --  86*  --   --   --   ?APTT  --   --   --   --  39* 101*  ?HEPARINUNFRC  --   --   --  0.39  --   --   ?CREATININE 2.08* 1.56*  --   --   --   --   ? ? ? ?Estimated Creatinine Clearance: 24.4 mL/min (A) (by C-G formula based on SCr of 1.56 mg/dL (H)). ? ? ?Medical History: ?Past Medical History:  ?Diagnosis Date  ? A-fib (HCC)   ? a.) CHA2DS2-VASc Score = 6 (age x 2, sex, HTN, aortic plaque, T2DM). b.) rate/rhythm maintained on oral amiodarone + metoprolol succinate; chronically anticoagulated with full dose apixaban  ? Anemia   ? 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 Aortic atherosclerosis (HCC)   ? Arthritis   ? Atrial flutter with rapid ventricular response (HCC) 02/24/2021  ? a.) in the setting of (+) SARS-CoV-2 infection; converted to NSR with increased dose of oral amiodarone.  ? Chronic cough   ? CKD (chronic kidney disease), stage III (HCC)   ? Complication of anesthesia   ? COPD (chronic obstructive pulmonary disease) (HCC)   ? Depression   ? Diastolic dysfunction   ? a.) TTE 04/08/2014: EF 60%; mild concentric LVH; G2DD. b.) TTE 12/21/2019: EF 60-65%, LA mildly dilated, mild-mod MR; PASP 36.8;  G1DD. c.) TTE 02/25/2021: EF 60-65%; normal LV function with mild concentric LVH; G1DD  ? Diverticulitis   ? Dyspnea   ? High cholesterol   ? History of 2019 novel coronavirus disease (COVID-19) 02/24/2021  ? History of hiatal hernia   ? History of kidney stones   ? Hypertension   ? Insomnia   ? Long term current use of anticoagulant   ? a.) apixaban  ? Lumbar spinal stenosis   ? Mild asthma   ? Murmur   ? 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 a.) clinical stage 1 ([id number]). b.) treated with SBRT (60 cGy over 5 fractions).  ? OSA on CPAP   ? Osteoporosis   ? Restless leg   ? Sepsis (HCC)   ? T2DM (type 2 diabetes mellitus) (HCC)   ? ? ?Medications:  ?Scheduled:  ?  stroke: mapping our early stages of recovery book   Does not apply Once  ? amiodarone  200 mg Oral Daily  ? vitamin C  500 mg Oral BID  ? calcium-vitamin D  1 tablet Oral Daily  ? chlorhexidine  5 mL Mouth/Throat BID  ? Chlorhexidine Gluconate Cloth  6 each Topical Daily  ? feeding supplement (GLUCERNA SHAKE)  237 mL Oral TID BM  ? ferrous gluconate  324 mg Oral q AM  ? gabapentin  300 mg Oral QHS  ? insulin aspart  0-9 Units Subcutaneous TID WC  ? insulin glargine-yfgn  15 Units Subcutaneous Daily  ? [START ON 07/26/2021] multivitamin with minerals  1 tablet Oral Daily  ? polyethylene glycol  17 g Oral Daily  ? pramipexole  1 mg Oral QHS  ? rosuvastatin  10 mg Oral QHS  ? sertraline  50 mg Oral QHS  ? zinc sulfate  220 mg Oral Daily  ? ?Infusions:  ? heparin 950 Units/hr (07/25/21 1249)  ? ? ?Assessment: ?81 yo F starting Heparin drip for Afib. Patient on Apixaban PTA-currently holding-last dose given 2/28 at 0825 ?Hgb 8.8  plt 86  HL 0.39 aPTT 39 ? ?Will assess aPTT and Heparin level for correlation ? ?Goal of Therapy:  ?Heparin level 0.3-0.7 units/ml ?aPTT 66-102 seconds ?Monitor platelets by anticoagulation protocol: Yes ?  ?Plan:  ?3/3:  aPTT @ 2052 = 101, therapeutic  X 1  ?Will draw aPTT and HL in 8 hrs on 3/4 @  0500. ?Will use aPTT to guide dosing until aPTT and HL correlate.  ? ?Jaelynne Hockley D ?07/25/2021,9:35 PM ? ? ?

## 2021-07-25 NOTE — Progress Notes (Addendum)
Hematology/Oncology Progress note [phone] [phone]     Patient Care Team: Lesleigh Noe, MD as PCP - General (Family Medicine) Kate Sable, MD as PCP - Cardiology (Cardiology) Telford Nab, RN as Oncology Nurse Navigator   Name of the patient: Natasha [name]  [id number]  1940-11-28  Date of visit: 07/25/21   INTERVAL HISTORY-   #07/21/2021, left cervical lymph node biopsy pathology is positive for malignancy, metastatic adenocarcinoma, compatible with lung primary.  #07/22/2021 MRI brain without contrast showed multiple new embolic strokes since last MRI 5 days ago.   #07/25/2021 CT head wo expected CT appearance of the scattered bilateral infarcts on 07/22/2021 MRI. No associated hemorrhage or mass effect. No new intracranial abnormality.  Restarted on heparin gtt without bolus.  + lower extremity weakness, improved.   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[place])    Brain metastases ([place])    Pressure injury of skin 07/24/2021   Anemia    Thrombocytopenia (HCC)    CVA (cerebral vascular accident) ([place]) 07/18/2021   Acute ischemic stroke ([place]) 07/17/2021   HLD (hyperlipidemia) 07/17/2021   Type II diabetes mellitus with renal manifestations ([place]) 07/17/2021   COPD (chronic obstructive pulmonary disease) ([place]) 07/17/2021   CKD (chronic kidney disease), stage IV (HCC) 07/17/2021   Chronic diastolic CHF (congestive heart failure) ([place]) 07/17/2021   Leukocytosis 07/17/2021   Bone lesion 70/05/7492   Acute embolic stroke ([place]) 49/67/5916   Lung cancer ([place]) 07/17/2021   Carpal tunnel syndrome on right 07/17/2021   Lesion of liver    Vitamin B12 deficiency 07/14/2021   Localized  swelling, mass and lump, neck 07/14/2021   Elevated sed rate 07/14/2021   Vitamin D deficiency 07/14/2021   CKD (chronic kidney disease) stage 4, GFR 15-29 ml/min (HCC) 06/24/2021   Polyarthralgia 06/16/2021   Muscle weakness 06/16/2021   Hyperkalemia 06/16/2021   Unsteadiness on feet 06/16/2021   Acute leg pain, right 06/16/2021   Double vision 03/13/2021   Dizziness 03/13/2021   COVID-19 virus infection 02/24/2021   Atrial flutter ([place]) 02/24/2021   Chronic bilateral low back pain with bilateral sciatica 02/17/2021   Decreased sensation 11/27/2020   Dermatitis 11/27/2020   Axillary lymphadenopathy 10/11/2020   Cancer of upper lobe of left lung ([place]) 10/11/2020   Goals of care, counseling/discussion 10/11/2020   Status post reverse total shoulder replacement, 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   Diverticulitis of colon 02/03/2020   Chronic pain syndrome 01/12/2019   Neuropathic pain 01/12/2019   At high risk for falls 11/02/2018   Neuroforaminal stenosis of lumbar spine 11/02/2018   Sacroiliitis ([place]) 11/02/2018   Former smoker 09/23/2018   Spinal stenosis of lumbar region without neurogenic claudication 09/23/2018   Weakness of both hands 09/23/2018   Overweight (BMI 25.0-29.9) 08/10/2018   Insomnia 06/17/2018   Chronic kidney disease with symptom management only, stage 3 (moderate) ([place]) 05/27/2018   Nonrheumatic aortic valve stenosis 05/24/2018   Iron deficiency anemia 12/24/2017   Paroxysmal atrial fibrillation ([place]) 12/24/2017   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/24/2017   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/2017   Gastroesophageal reflux disease 11/17/2016   OSA (obstructive sleep apnea) 07/24/2016   Diverticulosis 07/21/2016   Osteopenia  multiple sites 06/04/2016  ° Closed compression fracture of thoracic vertebra (HCC) 04/02/2015  ° Mixed hyperlipidemia 04/02/2015  ° Restless leg syndrome 04/02/2015  ° Closed fracture of lateral portion of left tibial plateau 04/06/2014  ° Essential  hypertension 04/06/2014  ° Mild intermittent asthma without complication 04/06/2014  ° Type 2 diabetes mellitus with other specified complication (HCC) 04/06/2014  ° ° ° °Past Medical History:  °Diagnosis Date  ° A-fib (HCC)   ° a.) CHA2DS2-VASc Score = 6 (age x 2, sex, HTN, aortic plaque, T2DM). 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  ° Chronic cough   ° CKD (chronic kidney disease), stage III (HCC)   ° Complication of anesthesia   ° COPD (chronic obstructive pulmonary disease) (HCC)   ° Depression   ° Diastolic dysfunction   ° a.) TTE 04/08/2014: EF 60%; mild concentric LVH; G2DD. b.) TTE 12/21/2019: EF 60-65%, LA mildly dilated, mild-mod MR; PASP 36.8; G1DD. c.) TTE 02/25/2021: EF 60-65%; normal LV function with mild concentric LVH; G1DD  ° Diverticulitis   ° Dyspnea   ° High cholesterol   ° History of 2019 novel coronavirus disease (COVID-19) 02/24/2021  ° History of hiatal hernia   ° History of kidney stones   ° Hypertension   ° Insomnia   ° Long term current use of anticoagulant   ° a.) apixaban  ° Lumbar spinal stenosis   ° Mild asthma   ° Murmur   ° Non-small cell carcinoma of left lung, stage 1 (HCC) 09/30/2020  ° a.) clinical stage 1 (cT1cN0cM0). b.) treated with SBRT (60 cGy over 5 fractions).  ° OSA on CPAP   ° Osteoporosis   ° Restless leg   ° Sepsis (HCC)   ° T2DM (type 2 diabetes mellitus) (HCC)   ° ° ° °Past Surgical History:  °Procedure Laterality Date  ° ABDOMINAL HYSTERECTOMY  1978  ° ANTERIOR INTEROSSEOUS NERVE DECOMPRESSION Right 07/15/2021  ° Procedure: Cubital tunnel release;  Surgeon: Menz, Michael, MD;  Location: [facility];  Service:  Orthopedics;  Laterality: Right;  ° APPENDECTOMY  1978  ° BREAST BIOPSY Left ?  ° papilloma  ° BREAST CYST EXCISION Bilateral yrs ago  ° benign, scars not well visualized  ° BREAST SURGERY    ° CARPAL TUNNEL RELEASE Left 04/22/2021  ° Procedure: Left carpal tunnel release & ulnar nerve release at elbow;  Surgeon: Menz, Michael, MD;  Location: [facility];  Service: Orthopedics;  Laterality: Left;  ° CARPAL TUNNEL RELEASE Right 07/15/2021  ° Procedure: CARPAL TUNNEL RELEASE;  Surgeon: Menz, Michael, MD;  Location: [facility];  Service: Orthopedics;  Laterality: Right;  ° CATARACT EXTRACTION W/ INTRAOCULAR LENS  IMPLANT, BILATERAL Bilateral   ° CHOLECYSTECTOMY    ° COLONOSCOPY    ° ELBOW SURGERY Right   ° [place] release  ° EYE SURGERY    ° FRACTURE SURGERY    ° GASTRIC BYPASS  12/22/2017  ° Roux-N-Y  ° JOINT REPLACEMENT    ° KNEE SURGERY Left   ° tibial fracture with metal plate  ° TOTAL SHOULDER REPLACEMENT Left 2014  ° ULNAR TUNNEL RELEASE Left 04/22/2021  ° Procedure: CUBITAL TUNNEL RELEASE;    RELEASE;  Surgeon: Hessie Knows, MD;  Location: [facility];  Service: Orthopedics;  Laterality: Left;   VIDEO BRONCHOSCOPY WITH ENDOBRONCHIAL NAVIGATION N/A 09/30/2020   Procedure: ROBOTIC ASSISTED VIDEO BRONCHOSCOPY WITH ENDOBRONCHIAL NAVIGATION;  Surgeon: Tyler Pita, MD;  Location: [facility];  Service: Pulmonary;  Laterality: N/A;   WRIST SURGERY Left    fractures    Social History   Socioeconomic History   Marital status: Married    Spouse name: Scientist, physiological   Number of children: 1   Years of education: some college   Highest education level: Not on file  Occupational History   Not on file  Tobacco Use   Smoking status: Former    Packs/day: 1.00    Years: 12.00    Pack years: 12.00    Types: Cigarettes    Quit date: 05/26/1975    Years since quitting: 46.1    Passive exposure: Past   Smokeless tobacco: Never  Vaping Use   Vaping Use: Never used  Substance and Sexual Activity   Alcohol use: No    Comment: rarely    Drug use: Never   Sexual activity: Not Currently  Other Topics Concern   Not on file  Social History Narrative   07/22/20   [place]   Living: with husband, Scientist, physiological [phone])   Work: retired - high end Journalist, newspaper      Family: grandson - Ovid Curd 1998/07/03) (son is deceased) - and living with them      Enjoys: Enjoys Social worker, going to art shows, gardening and painting      Exercise: not currently   Diet: does not follow diabetic diet      Safety   Seat belts: Yes    Guns: Yes  and secure   Safe in relationships: Yes    Social Determinants of Health   Financial Resource Strain: Not on file  Food Insecurity: Not on file  Transportation Needs: Not on file  Physical Activity: Not on file  Stress: Not on file  Social Connections: Not on file  Intimate Partner Violence: Not on file     Family History  Problem Relation Age of Onset   Other Mother        died from surgery   AAA (abdominal aortic aneurysm) Mother    Diabetes Father        controlled by diet   Dementia Father        brain atrophy - unknown origin   Breast cancer Cousin        maternal     Current Facility-Administered Medications:     stroke: mapping our early stages of recovery book, , Does not apply, Once, Howerter, Justin B, DO   acetaminophen (TYLENOL) tablet 650 mg, 650 mg, Oral, Q6H PRN, Ivor Costa, MD, 650 mg at 07/25/21 0402   amiodarone (PACERONE) tablet 200 mg, 200 mg, Oral, Daily, Doristine Mango L, MD, 200 mg at 07/25/21 4132   ascorbic acid (VITAMIN C) tablet 500 mg, 500 mg, Oral, BID, Richarda Osmond, MD   calcium-vitamin D (OSCAL WITH D) 500-5 MG-MCG per tablet 1 tablet, 1 tablet, Oral, Daily, Ivor Costa, MD, 1 tablet at 07/25/21 0859   chlorhexidine (PERIDEX) 0.12 % solution 5 mL, 5 mL, Mouth/Throat, BID, Ivor Costa, MD, 5 mL at 07/25/21 0859   Chlorhexidine Gluconate Cloth 2 % PADS 6 each, 6 each, Topical, Daily, Richarda Osmond, MD,  6 each at 07/25/21  dextromethorphan-guaiFENesin (MUCINEX DM) 30-600 MG per 12 hr tablet 1 tablet, 1 tablet, Oral, BID PRN, Niu, Xilin, MD °  feeding supplement (GLUCERNA SHAKE) (GLUCERNA SHAKE) liquid 237 mL, 237 mL, Oral, TID BM, Anderson, Chelsey L, MD °  ferrous gluconate (FERGON) tablet 324 mg, 324 mg, Oral, q AM, Niu, Xilin, MD, 324 mg at 07/25/21 0858 °  gabapentin (NEURONTIN) capsule 300 mg, 300 mg, Oral, QHS, Niu, Xilin, MD, 300 mg at 07/24/21 2051 °  heparin ADULT infusion 100 units/mL (25000 units/250mL), 950 Units/hr, Intravenous, Continuous, Merrill, Kristin A, RPH, Last Rate: 9.5 mL/hr at 07/25/21 1249, 950 Units/hr at 07/25/21 1249 °  insulin aspart (novoLOG) injection 0-9 Units, 0-9 Units, Subcutaneous, TID WC, Niu, Xilin, MD, 5 Units at 07/25/21 1245 °  insulin glargine-yfgn (SEMGLEE) injection 15 Units, 15 Units, Subcutaneous, Daily, Anderson, Chelsey L, MD, 15 Units at 07/25/21 0859 °  levalbuterol (XOPENEX) nebulizer solution 1.25 mg, 1.25 mg, Inhalation, Q8H PRN, Niu, Xilin, MD °  [START ON 07/26/2021] multivitamin with minerals tablet 1 tablet, 1 tablet, Oral, Daily, Anderson, Chelsey L, MD °  ondansetron (ZOFRAN) injection 4 mg, 4 mg, Intravenous, Q8H PRN, Niu, Xilin, MD °  polyethylene glycol (MIRALAX / GLYCOLAX) packet 17 g, 17 g, Oral, Daily, Williams, Jamiese M, MD, 17 g at 07/25/21 0859 °  pramipexole (MIRAPEX) tablet 1 mg, 1 mg, Oral, QHS, Niu, Xilin, MD, 1 mg at 07/24/21 2050 °  rosuvastatin (CRESTOR) tablet 10 mg, 10 mg, Oral, QHS, Williams, Jamiese M, MD, 10 mg at 07/24/21 2050 °  sertraline (ZOLOFT) tablet 50 mg, 50 mg, Oral, QHS, Niu, Xilin, MD, 50 mg at 07/24/21 2051 °  zinc sulfate capsule 220 mg, 220 mg, Oral, Daily, Anderson, Chelsey L, MD ° ° °Physical exam:  °Vitals:  ° 07/25/21 0035 07/25/21 0451 07/25/21 0823 07/25/21 1545  °BP: (!) 117/48 (!) 131/47 (!) 119/42 (!) 121/42  °Pulse: 70 70 73 79  °Resp: 17 18 17 17  °Temp: 98 °F (36.7 °C) 98 °F (36.7 °C) 98.1 °F (36.7 °C)  98.4 °F (36.9 °C)  °TempSrc:   Oral Oral  °SpO2: 99% 95% 96% 95%  °Weight:      °Height:      ° °Physical Exam °HENT:  °   Head: Normocephalic and atraumatic.  °Cardiovascular:  °   Rate and Rhythm: Normal rate.  °Pulmonary:  °   Effort: No respiratory distress.  °Abdominal:  °   General: Abdomen is flat. There is no distension.  °   Palpations: Abdomen is soft.  °Musculoskeletal:     °   General: Normal range of motion.  °   Cervical back: Normal range of motion.  °Skin: °   General: Skin is warm and dry.  °Neurological:  °   Mental Status: She is alert and oriented to person, place, and time.  °   Comments: Decreased strength of LUE, and bilateral lower extremities  °Psychiatric:     °   Mood and Affect: Mood normal.  °  ° ° ° °CMP Latest Ref Rng & Units 07/24/2021  °Glucose 70 - 99 mg/dL 224(H)  °BUN 8 - 23 mg/dL 60(H)  °Creatinine 0.44 - 1.00 mg/dL 1.56(H)  °Sodium 135 - 145 mmol/L 135  °Potassium 3.5 - 5.1 mmol/L 3.9  °Chloride 98 - 111 mmol/L 107  °CO2 22 - 32 mmol/L 19(L)  °Calcium 8.9 - 10.3 mg/dL 8.2(L)  °Total Protein 6.5 - 8.1 g/dL 4.7(L)  °Total Bilirubin 0.3 - 1.2 mg/dL 0.6  °Alkaline   0.6  Alkaline Phos 38 - 126 U/L 220(H)  AST 15 - 41 U/L 110(H)  ALT 0 - 44 U/L 41   CBC Latest Ref Rng & Units 07/25/2021  WBC 4.0 - 10.5 K/uL 10.2  Hemoglobin 12.0 - 15.0 g/dL 8.8(L)  Hematocrit 36.0 - 46.0 % 27.5(L)  Platelets 150 - 400 K/uL 86(L)    RADIOGRAPHIC STUDIES: I have personally reviewed the radiological images as listed and agreed with the findings in the report. CT HEAD WO CONTRAST (5MM)  Result Date: 07/25/2021 CLINICAL DATA:  81 year old female with recent neurologic deficit and embolic appearing infarcts scattered throughout the brain on 07/22/2021. EXAM: CT HEAD WITHOUT CONTRAST TECHNIQUE: Contiguous axial images were obtained from the base of the skull through the vertex without intravenous contrast. RADIATION DOSE REDUCTION: This exam was performed according to the departmental dose-optimization program  which includes automated exposure control, adjustment of the mA and/or kV according to patient size and/or use of iterative reconstruction technique. COMPARISON:  Brain MRI 07/22/2021.  Head CT 07/17/2021. FINDINGS: Brain: Development of cytotoxic edema corresponding to the DWI positive infarcts on the recent MRI, most conspicuous in the left occipital pole (series 2, image 12), right centrum semiovale (image 18), and cerebellum (image 9). No associated hemorrhage. No mass effect. Stable gray-white matter differentiation otherwise. No ventriculomegaly. Normal basilar cisterns. Vascular: Calcified atherosclerosis at the skull base. No suspicious intracranial vascular hyperdensity. Skull: Osteopenia.  No acute osseous abnormality identified. Sinuses/Orbits: Trace fluid layering in the left sphenoid sinus. Other Visualized paranasal sinuses and mastoids are clear. Other: No acute orbit or scalp soft tissue finding. IMPRESSION: 1. Expected CT appearance of the scattered bilateral infarcts on 07/22/2021 MRI. No associated hemorrhage or mass effect. 2. No new intracranial abnormality. Electronically Signed   By: Genevie Ann M.D.   On: 07/25/2021 08:56   CT Head Wo Contrast  Result Date: 07/17/2021 CLINICAL DATA:  Nonspecific dizziness EXAM: CT HEAD WITHOUT CONTRAST TECHNIQUE: Contiguous axial images were obtained from the base of the skull through the vertex without intravenous contrast. RADIATION DOSE REDUCTION: This exam was performed according to the departmental dose-optimization program which includes automated exposure control, adjustment of the mA and/or kV according to patient size and/or use of iterative reconstruction technique. COMPARISON:  03/09/2021 FINDINGS: Brain: 2 small right cerebellar infarcts not seen on prior, possibly recent and symptomatic. Few remote supratentorial white matter insults. No hemorrhage, hydrocephalus, or collection. Age normal brain volume. Vascular: No hyperdense vessel or  unexpected calcification. Skull: Normal. Negative for fracture or focal lesion. Sinuses/Orbits: No acute finding. 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 Electronically Signed   By: Jorje Guild M.D.   On: 07/17/2021 04:32   MR ANGIO HEAD WO CONTRAST  Result Date: 07/17/2021 CLINICAL DATA:  Dizziness, infarcts on MRI EXAM: MRA NECK WITHOUT CONTRAST MRA HEAD WITHOUT CONTRAST TECHNIQUE: Angiographic images of the Circle of Willis were acquired using MRA technique without intravenous contrast. COMPARISON:  None. FINDINGS: MRA NECK FINDINGS Standard aortic branching. Common, internal, and external carotid arteries are patent, without hemodynamically significant stenosis. Extracranial vertebral arteries are patent, without hemodynamically significant stenosis, although imaging of the origins is somewhat limited by artifact. MRA HEAD FINDINGS Both internal carotid arteries are patent to the termini, without significant stenosis. A1 segments patent. Normal anterior communicating artery. Anterior cerebral arteries are patent to their distal aspects. No M1 stenosis or occlusion. Normal MCA bifurcations. Distal MCA branches perfused and symmetric. Vertebral arteries  patent to the vertebrobasilar junction without stenosis. Basilar patent to its distal aspect. Superior cerebellar arteries patent bilaterally. Patent P1 segments, diminutive on the right. Near fetal origin of the right PCA with patent right posterior communicating artery. PCAs perfused to their distal aspects without stenosis. Possible diminutive left posterior communicating artery. IMPRESSION: 1.  No intracranial large vessel occlusion or significant stenosis. 2.  No hemodynamically significant stenosis in the neck. Electronically Signed   By: Merilyn Baba M.D.   On: 07/17/2021 23:08   MR ANGIO NECK WO CONTRAST  Result Date: 07/17/2021 CLINICAL DATA:  Dizziness, infarcts on  MRI EXAM: MRA NECK WITHOUT CONTRAST MRA HEAD WITHOUT CONTRAST TECHNIQUE: Angiographic images of the Circle of Willis were acquired using MRA technique without intravenous contrast. COMPARISON:  None. FINDINGS: MRA NECK FINDINGS Standard aortic branching. Common, internal, and external carotid arteries are patent, without hemodynamically significant stenosis. Extracranial vertebral arteries are patent, without hemodynamically significant stenosis, although imaging of the origins is somewhat limited by artifact. MRA HEAD FINDINGS Both internal carotid arteries are patent to the termini, without significant stenosis. A1 segments patent. Normal anterior communicating artery. Anterior cerebral arteries are patent to their distal aspects. No M1 stenosis or occlusion. Normal MCA bifurcations. Distal MCA branches perfused and symmetric. Vertebral arteries patent to the vertebrobasilar junction without stenosis. Basilar patent to its distal aspect. Superior cerebellar arteries patent bilaterally. Patent P1 segments, diminutive on the right. Near fetal origin of the right PCA with patent right posterior communicating artery. PCAs perfused to their distal aspects without stenosis. Possible diminutive left posterior communicating artery. IMPRESSION: 1.  No intracranial large vessel occlusion or significant stenosis. 2.  No hemodynamically significant stenosis in the neck. Electronically Signed   By: Merilyn Baba M.D.   On: 07/17/2021 23:08   MR BRAIN WO CONTRAST  Result Date: 07/22/2021 CLINICAL DATA:  Acute neuro deficit. Stroke. Now with bilateral leg weakness and diplopia. EXAM: MRI HEAD WITHOUT CONTRAST TECHNIQUE: Multiplanar, multiecho pulse sequences of the brain and surrounding structures were obtained without intravenous contrast. COMPARISON:  MRI head 07/17/2021 FINDINGS: Brain: Numerous areas of acute infarct are present compatible with emboli. Multiple small infarcts in the cerebellum bilaterally are stable.  Progressive acute infarct in the left occipital pole with mild associated petechial hemorrhage which was seen previously. Multiple small areas of acute infarct in the frontal and parietal lobes bilaterally and in the white matter. Progression of cluster of acute infarcts in the right parietal white matter and also in the right medial parietal cortex. Small acute infarct head of caudate on the left unchanged. Small acute infarct head of caudate on the right is new. Ventricle size normal.  No mass or midline shift. Vascular: Normal arterial flow voids. Skull and upper cervical spine: Bilateral calvarial lesions are again noted. These are suspicious for metastatic disease. Sinuses/Orbits: Paranasal sinuses clear. Bilateral cataract extraction Other: None IMPRESSION: Numerous areas of acute infarct in the cerebrum and cerebellum bilaterally compatible with acute embolic infarction. There has been progression of acute infarcts since the recent MRI of 07/17/2021. Findings suggest recurrent emboli. Progression of infarct left occipital pole. Petechial hemorrhage in this area unchanged from the prior study. No other hemorrhage. Lesions in the calvarium bilaterally, suspicious for metastatic disease. Electronically Signed   By: Franchot Gallo M.D.   On: 07/22/2021 14:59   MR BRAIN WO CONTRAST  Result Date: 07/17/2021 CLINICAL DATA:  Nonspecific dizziness. EXAM: MRI HEAD WITHOUT CONTRAST TECHNIQUE: Multiplanar, multiecho pulse sequences of the brain and surrounding structures were  contrast. COMPARISON:  Head CT from earlier today FINDINGS: Brain: Patchy acute infarcts in the bilateral cerebellum and bilateral frontal, parietal, and occipital convexities. Patchy acute infarct in the right more than left centrum semiovale and in the left caudate head. Mild petechial hemorrhage at the right occipital cortex. No hematoma, hydrocephalus, or collection. Vascular: Normal flow voids Skull and upper  cervical spine: New scattered bone lesions in the C2 right articular process, right para median clivus tip, and in the bilateral calvarium, affected areas marked on sagittal T2 weighted imaging. Sinuses/Orbits: Negative IMPRESSION: 1. Numerous small acute infarcts scattered in the brain and compatible with central embolic disease. 2. 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 Recommend metastatic workup. Electronically Signed   By: Jonathan  Watts M.D.   On: 07/17/2021 05:42  ° °MR Knee Right w/o contrast ° °Result Date: 06/29/2021 °CLINICAL DATA:  Right knee pain for 2 weeks EXAM: MRI OF THE RIGHT KNEE WITHOUT CONTRAST TECHNIQUE: Multiplanar, multisequence MR imaging of the knee was performed. No intravenous contrast was administered. COMPARISON:  None. FINDINGS: MENISCI Medial: Degeneration of the posterior horn of the medial meniscus. No discrete tear. Lateral: Degeneration of the body of the lateral meniscus with a linear component which does not extend to the articular surface. No discrete tear extending to the articular surface. LIGAMENTS Cruciates: ACL and PCL are intact. Collaterals: Medial collateral ligament is intact. Lateral collateral ligament complex is intact. CARTILAGE Patellofemoral: Partial-thickness cartilage loss with areas of full-thickness cartilage loss of the lateral patellar facet with subchondral reactive marrow changes. Partial-thickness cartilage loss of the medial patellofemoral compartment. Medial:  No chondral defect. Lateral: Partial-thickness cartilage loss of the weight-bearing surface of the lateral femoral condyle. JOINT: No joint effusion. Normal Hoffa's fat-pad. No plical thickening. POPLITEAL FOSSA: Popliteus tendon is intact. No Baker's cyst. EXTENSOR MECHANISM: Intact quadriceps tendon. Intact patellar tendon. Intact lateral patellar retinaculum. Intact medial patellar retinaculum. Intact MPFL. BONES: No aggressive osseous lesion. No fracture or  dislocation. Other: No fluid collection or hematoma. Muscles are normal. IMPRESSION: 1. Partial-thickness cartilage loss with areas of full-thickness cartilage loss of the lateral patellar facet with subchondral reactive marrow changes. Partial-thickness cartilage loss of the medial patellofemoral compartment. 2. Partial-thickness cartilage loss of the weight-bearing surface of the lateral femoral condyle. 3. Degeneration of the body of the lateral meniscus with a linear component which does not extend to the articular surface. No discrete meniscal tear extending to the articular surface. Electronically Signed   By: Hetal  Patel M.D.   On: 06/29/2021 10:09  ° °ECHOCARDIOGRAM COMPLETE ° °Result Date: 07/17/2021 °   ECHOCARDIOGRAM REPORT   Patient Name:   Rini Lawless Date of Exam: 07/17/2021 Medical Rec #:  [id number]     Height:       64.0 in Accession #:    [id number]    Weight:       137.0 lb Date of Birth:  04/27/1941     BSA:          1.666 m² Patient Age:    81 years      BP:           131/66 mmHg Patient Gender: F             HR:           67 bpm. Exam Location:  [facility] Procedure: 2D Echo, Cardiac Doppler and Color Doppler Indications:     Stroke I63.9  History:           Patient has prior history of Echocardiogram examinations, most                  recent 02/25/2021. COPD, Arrythmias:Atrial Fibrillation;                  Signs/Symptoms:Murmur.  Sonographer:     Jerry Hege Referring Phys:  [id number] XILIN NIU Diagnosing Phys: Timothy Gollan MD  Sonographer Comments: No parasternal window and suboptimal apical window. Image acquisition challenging due to COPD. IMPRESSIONS  1. Left ventricular ejection fraction, by estimation, is 60 to 65%. The left ventricle has normal function. The left ventricle has no regional wall motion abnormalities. Left ventricular diastolic parameters are consistent with Grade I diastolic dysfunction (impaired relaxation).  2. Right ventricular systolic function is normal. The right ventricular  size is normal. There is mildly elevated pulmonary artery systolic pressure. The estimated right ventricular systolic pressure is 41.0 mmHg.  3. The mitral valve is normal in structure. No evidence of mitral valve regurgitation. No evidence of mitral stenosis.  4. The aortic valve is normal in structure. Aortic valve regurgitation is mild to moderate. No aortic stenosis is present.  5. The inferior vena cava is normal in size with greater than 50% respiratory variability, suggesting right atrial pressure of 3 mmHg. FINDINGS  Left Ventricle: Left ventricular ejection fraction, by estimation, is 60 to 65%. The left ventricle has normal function. The left ventricle has no regional wall motion abnormalities. The left ventricular internal cavity size was normal in size. There is  no left ventricular hypertrophy. Left ventricular diastolic parameters are consistent with Grade I diastolic dysfunction (impaired relaxation). Right Ventricle: The right ventricular size is normal. No increase in right ventricular wall thickness. Right ventricular systolic function is normal. There is mildly elevated pulmonary artery systolic pressure. The tricuspid regurgitant velocity is 3.00  m/s, and with an assumed right atrial pressure of 5 mmHg, the estimated right ventricular systolic pressure is 41.0 mmHg. Left Atrium: Left atrial size was normal in size. Right Atrium: Right atrial size was normal in size. Pericardium: There is no evidence of pericardial effusion. Mitral Valve: The mitral valve is normal in structure. Mild mitral annular calcification. No evidence of mitral valve regurgitation. No evidence of mitral valve stenosis. MV peak gradient, 5.9 mmHg. The mean mitral valve gradient is 2.0 mmHg. Tricuspid Valve: The tricuspid valve is normal in structure. Tricuspid valve regurgitation is not demonstrated. No evidence of tricuspid stenosis. Aortic Valve: The aortic valve is normal in structure. Aortic valve regurgitation is mild  to moderate. No aortic stenosis is present. Aortic valve mean gradient measures 2.5 mmHg. Aortic valve peak gradient measures 4.3 mmHg. Aortic valve area, by VTI measures 3.16 cm². Pulmonic Valve: The pulmonic valve was normal in structure. Pulmonic valve regurgitation is not visualized. No evidence of pulmonic stenosis. Aorta: The aortic root is normal in size and structure. Venous: The inferior vena cava is normal in size with greater than 50% respiratory variability, suggesting right atrial pressure of 3 mmHg. IAS/Shunts: No atrial level shunt detected by color flow Doppler.  LEFT VENTRICLE PLAX 2D LVIDd:         3.54 cm   Diastology LVIDs:         2.34 cm   LV e' medial:    5.22 cm/s LV PW:         0.98 cm   LV E/e' medial:  12.3 LV IVS:        0.89 cm   LV e' lateral:     7.40 cm/s LVOT diam:     2.00 cm   LV E/e' lateral: 8.7 LV SV:         66 LV SV Index:   40 LVOT Area:     3.14 cm²  RIGHT VENTRICLE RV Basal diam:  3.40 cm RV S prime:     14.80 cm/s TAPSE (M-mode): 2.7 cm LEFT ATRIUM             Index        RIGHT ATRIUM           Index LA diam:        3.60 cm 2.16 cm/m²   RA Area:     18.20 cm² LA Vol (A2C):   91.5 ml 54.93 ml/m²  RA Volume:   52.90 ml  31.76 ml/m² LA Vol (A4C):   62.7 ml 37.64 ml/m² LA Biplane Vol: 77.7 ml 46.64 ml/m²  AORTIC VALVE AV Area (Vmax):    2.47 cm² AV Area (Vmean):   2.53 cm² AV Area (VTI):     3.16 cm² AV Vmax:           104.05 cm/s AV Vmean:          68.000 cm/s AV VTI:            0.211 m AV Peak Grad:      4.3 mmHg AV Mean Grad:      2.5 mmHg LVOT Vmax:         81.90 cm/s LVOT Vmean:        54.700 cm/s LVOT VTI:          0.212 m LVOT/AV VTI ratio: 1.00  AORTA Ao Root diam: 2.50 cm MITRAL VALVE                TRICUSPID VALVE MV Area (PHT): 2.76 cm²     TR Peak grad:   36.0 mmHg MV Area VTI:   2.13 cm²     TR Vmax:        300.00 cm/s MV Peak grad:  5.9 mmHg MV Mean grad:  2.0 mmHg     SHUNTS MV Vmax:       1.21 m/s     Systemic VTI:  0.21 m MV Vmean:      72.7 cm/s    Systemic  Diam: 2.00 cm MV Decel Time: 275 msec MV E velocity: 64.30 cm/s MV A velocity: 117.00 cm/s MV E/A ratio:  0.55 Timothy Gollan MD Electronically signed by Timothy Gollan MD Signature Date/Time: 07/17/2021/4:35:51 PM    Final   ° °US CORE BIOPSY (LYMPH NODES) ° °Result Date: 07/21/2021 °INDICATION: 81-year-old with history of lung cancer. 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 Plan for ultrasound-guided liver lesion biopsy. Patient also notes a new nodule on the left side of her neck. EXAM: ULTRASOUND-GUIDED LEFT CERVICAL LYMPH NODE BIOPSY MEDICATIONS: None. ANESTHESIA/SEDATION: None FLUOROSCOPY TIME:  None COMPLICATIONS: None immediate. PROCEDURE: Informed written consent was obtained from the patient after a thorough discussion of the procedural risks, benefits and alternatives. All questions were addressed. A timeout was performed prior to the initiation of the procedure. Liver was thoroughly evaluated with ultrasound. The liver is heterogeneous but a discrete lesion was not identified. Left side of the neck was evaluated with ultrasound and an abnormal small lymph node on the left side of the neck was identified. Left cervical lymph node was targeted for biopsy. The left side of the neck was   prepped with chlorhexidine and sterile field was created. Skin was anesthetized with 1% lidocaine. Small incision was made. Using ultrasound guidance, an 18 gauge core device was directed into the lymph node. Four core biopsies were obtained and placed on a Telfa pad with saline. Bandage placed over the puncture site. FINDINGS: Liver is heterogeneous but no discrete lesions could be identified. Therefore, the neck was evaluated for supraclavicular lymphadenopathy. Patient noted a bump on the left side of the neck and there was a rounded small abnormal lymph node at the area of concern. There is also a slightly prominent left supraclavicular lymph node which was not amenable for  biopsy. The lymph node in the left mid neck was targeted and biopsied. Biopsy needle was confirmed within the lesion. No immediate bleeding or hematoma formation. IMPRESSION: 1. Ultrasound-guided core biopsy of a small but abnormal looking lymph node on the left side of the neck. 2. Ultrasound-guided liver biopsy was not performed because the liver lesions are not clearly visible on ultrasound. If the neck biopsy is inconclusive or negative, consider further evaluation with PET-CT. CT-guided liver lesion biopsy could be attempted as well. Electronically Signed   By: Markus Daft M.D.   On: 07/21/2021 15:33   CT CHEST ABDOMEN PELVIS WO CONTRAST  Result Date: 07/17/2021 CLINICAL DATA:  Bone lesions seen on brain MRI. Evaluate for underlying malignancy. History of lung cancer. EXAM: CT CHEST, ABDOMEN AND PELVIS WITHOUT CONTRAST TECHNIQUE: Multidetector CT imaging of the chest, abdomen and pelvis was performed following the standard protocol without IV contrast. RADIATION DOSE REDUCTION: This exam was performed according to the departmental dose-optimization program which includes automated exposure control, adjustment of the mA and/or kV according to patient size and/or use of iterative reconstruction technique. COMPARISON:  Chest CT 04/10/2021 FINDINGS: CT CHEST FINDINGS Cardiovascular: The heart is normal in size. No pericardial effusion. The aorta is normal in caliber. Stable atherosclerotic calcifications. Remarkably no coronary artery calcifications. Mediastinum/Nodes: Progressive left hilar adenopathy the, difficult to measure without contrast. New subcarinal adenopathy with 12.5 mm node on image 28/2. 8.5 mm right paratracheal node on image 20/2. Lungs/Pleura: Enlarging left upper lobe/suprahilar mass measuring approximately 3 cm on image 33/4. Findings consistent with recurrent lung cancer and left hilar and mediastinal adenopathy. No new pulmonary nodules to suggest pulmonary metastatic disease. Progressive  right basilar scarring changes and streaky basilar atelectasis. Musculoskeletal: No breast masses are identified. No supraclavicular adenopathy. A few scattered axillary lymph nodes are stable. Suspect scattered subtle slightly sclerotic bone lesions. CT ABDOMEN PELVIS FINDINGS Hepatobiliary: New diffuse hepatic metastatic disease. Numerous small lesions throughout both lobes of the liver. The largest lesion at the right hepatic dome measures 2.5 cm on image 43/2. The gallbladder is surgically absent. No common bile duct dilatation. Pancreas: No mass, inflammation or ductal dilatation. Spleen: Normal size.  No focal lesions. Adrenals/Urinary Tract: Stable right adrenal gland nodule. No worrisome renal lesions are identified without contrast. 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 No complicating features. The small bowel and colon are grossly normal. Vascular/Lymphatic: Stable atherosclerotic calcifications involving the aorta and iliac arteries but no aneurysm. Small scattered mesenteric and retroperitoneal lymph nodes but no mass or overt adenopathy the. Reproductive: Surgically absent. Other: No pelvic mass or adenopathy. No free pelvic fluid collections. No inguinal mass or adenopathy. No abdominal wall hernia or subcutaneous lesions. Musculoskeletal: No lytic destructive bone lesions. No spinal canal compromise. IMPRESSION: 1. Enlarging left upper lobe/suprahilar mass with associated left hilar and mediastinal adenopathy consistent with  recurrent lung cancer. 2. New diffuse hepatic metastatic disease. 3. Suspect scattered subtle slightly sclerotic bone lesions. No lytic or destructive bone lesions. 4. PET-CT may be helpful for accurate staging, if necessary. 5. Stable right adrenal gland nodule. 6. 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 * onc * Aortic Atherosclerosis (ICD10-I70.0). Electronically Signed   By: P.  Gallerani M.D.   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 °Status post ultrasound-guided left cervical lymphadenopathy.  Pathology is positive for adenocarcinoma, lung origin.  Pathology report was reviewed and discussed with patient and family members.  They understand that condition is not curable. 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 °send NGS on the current specimen. If she does not have any targetable mutation, then treatment option will be chemotherapy plus minus immunotherapy. °Patient needs to follow-up outpatient at the cancer center to review pathology results and management plan. ° °  °#Acute embolic stroke, recurrent.  Secondary to A-fib and likely underlying hypercoagulable state due to cancer. °Continue heparin gtt.  ° °#Anemia due to CKD °continue oral iron supplementation. ° °#Thrombocytopenia, etiology unknown.  Immature platelet fraction is 7.8, inappropriately normal.  Possible decreased bone marrow production, as well as consumption.  Improving. °  °Thank you for allowing me to participate in the care of this patient.  ° °Zhou Yu, MD, PhD °Hematology Oncology °07/25/2021  °

## 2021-07-25 NOTE — Progress Notes (Signed)
Initial Nutrition Assessment ? ?DOCUMENTATION CODES:  ? ?Not applicable ? ?INTERVENTION:  ? ?-Glucerna Shake po TID, each supplement provides 220 kcal and 10 grams of protein  ?-MVI with minerals ?-500 mg vitamin C BID ?-220 mg zinc sulfate daily x 14 days ? ?NUTRITION DIAGNOSIS:  ? ?Increased nutrient needs related to wound healing as evidenced by estimated needs. ? ?GOAL:  ? ?Patient will meet greater than or equal to 90% of their needs ? ?MONITOR:  ? ?PO intake, Supplement acceptance, Labs, Weight trends, Skin, I & O's ? ?REASON FOR ASSESSMENT:  ? ?Malnutrition Screening Tool ?  ? ?ASSESSMENT:  ? ?Natasha [name] is a 81 y.o. female with a PMH significant for A-fib on Eliquis, HTN, HLD, type II DM, COPD, asthma, GERD, depression, former smoker, RLS, OSA, CKD 4, CHF, H/o lung cancer s/p radiation therapy, IDA, chronic pain. ? ?Pt admitted with CVA.  ? ?2/27- lymph node biopsy ?2/28- patient had recurrent stroke symptoms and new lesions seen on repeat head CT.  ?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 ? ?Reviewed I/O's: -1.5 L x 24 hours and -3.9 L since admission ? ?UOP: 1.8 L x 24 hours ? ?Pt unavailable at time of visit. RD unable to obtain further nutrition-related history or complete nutrition-focused physical exam at this time.    ? ?Pt with improving oral intake. Noted meal completions 15-100%. ? ?Reviewed wt hx; pt has experienced a 9.1% wt loss over the past 3 months, which is significant for time frame.  ? ?Pt with increased nutritional needs for wound healing and would benefit from addition of oral nutrition supplements.  ? ?Medications reviewed and include calcium with vitamin D and miralax.  ? ?Lab Results  ?Component Value Date  ? HGBA1C 8.3 (H) 07/17/2021  ? PTA DM medications are 15 units tresiba daily and 25 mg jardiance daily.  ? ?Labs reviewed: CBGS: 161-096 (inpatient orders for glycemic control are 0-9 units insulin aspart TID with meals  and 15 units insulin glargine-yfgn daily).   ? ?Diet Order:   ?Diet Order   ? ?       ?  Diet Carb Modified Fluid consistency: Thin; Room service appropriate? Yes with Assist  Diet effective now       ?  ? ?  ?  ? ?  ? ? ?EDUCATION NEEDS:  ? ?No education needs have been identified at this time ? ?Skin:  Skin Assessment: Skin Integrity Issues: ?Skin Integrity Issues:: Incisions, Stage II, DTI ?DTI: buttocks ?Stage II: coccyx ?Incisions: closed neck rt arm ? ?Last BM:  07/23/21 ? ?Height:  ? ?Ht Readings from Last 1 Encounters:  ?07/17/21 $RemoveBe'5\' 4"'eexOwKyWN$  (1.626 m)  ? ? ?Weight:  ? ?Wt Readings from Last 1 Encounters:  ?07/23/21 63.1 kg  ? ? ?Ideal Body Weight:  54.5 kg ? ?BMI:  Body mass index is 23.88 kg/m?. ? ?Estimated Nutritional Needs:  ? ?Kcal:  1650-1850 ? ?Protein:  85-100 grams ? ?Fluid:  > 1.6 L ? ? ? ?Loistine Chance, RD, LDN, CDCES ?Registered Dietitian II ?Certified Diabetes Care and Education Specialist ?Please refer to [facility] for RD and/or RD on-call/weekend/after hours pager  ?

## 2021-07-25 NOTE — TOC Progression Note (Addendum)
Transition of Care (TOC) - Progression Note  ? ? ?Patient Details  ?Name: Jamica Woodyard ?MRN: [id number] ?Date of Birth: [birth year] ? ?Transition of Care (TOC) CM/SW Contact  ?Pete Pelt, RN ?Phone Number: ?07/25/2021, 9:20 AM ? ?Clinical Narrative:   As per care team, patient will discharge to [facility] on Monday. ? ? ?Addendum 1449: Leslie from [facility] has made several calls to [facility] today regarding patient status and cancer treatment.  Patient has consults and will follow up as outpatient.  Magda Paganini called at this time to state facility will not be able to offer patient a bed any longer. ? ?Addendum 1525:  family amenable to [facility] as per spouse.  Acceptance sent to [facility] ?  ?  ? ?Expected Discharge Plan and Services ?  ?  ?  ?  ?  ?                ?  ?  ?  ?  ?  ?  ?  ?  ?  ?  ? ? ?Social Determinants of Health (SDOH) Interventions ?  ? ?Readmission Risk Interventions ?No flowsheet data found. ? ?

## 2021-07-25 NOTE — Progress Notes (Signed)
Occupational Therapy Treatment ?Patient Details ?Name: Natasha [name] ?MRN: [id number] ?DOB: [birth year] ?Today's Date: 07/25/2021 ? ? ?History of present illness 81 y.o. female with history of atrial fibrillation on Eliquis, CHF, hypertension, hyperlipidemia, diabetes, COPD, chronic kidney disease, lung CA, depression, and recent R carpal tunnel release (Jul 15, 2021), who presents to the emergency department with complaints of vertigo. 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 ?  ?OT comments ? Ms Pearlman was seen for OT/PT co-treatment on this date. Upon arrival to room pt reclined in bed with PT and family at bedside agreeable to tx. Pt tolerated x3 sit<>stand trials with use of RW improved ability to form full fist noted this date. MAX A x2 + RW, achieves upright posture. MAX A x2 sit>sup. MAX A don B PRAFO boots at bed level. Pt and family instructed on bed level HEP and joint protection strategies. Yellow theraputty (provided) and yellow theraband (provided) with LUE exercises reviewed, completed 1 set AROM and 1 set self-ROM at bed level. Pt making good progress toward goals. Pt continues to benefit from skilled OT services to maximize return to PLOF and minimize risk of future falls, injury, caregiver burden, and readmission. Will continue to follow POC. Discharge recommendation remains appropriate.  ?  ? ?Recommendations for follow up therapy are one component of a multi-disciplinary discharge planning process, led by the attending physician.  Recommendations may be updated based on patient status, additional functional criteria and insurance authorization. ?   ?Follow Up Recommendations ? Skilled nursing-short term rehab (<3 hours/day)  ?  ?Assistance Recommended at Discharge Frequent or constant Supervision/Assistance  ?Patient can return home with the  following ? Two people to help with walking and/or transfers;Two people to help with bathing/dressing/bathroom;Help with stairs or ramp for entrance ?  ?Equipment Recommendations ? Other (comment) (defer)  ?  ?Recommendations for Other Services   ? ?  ?Precautions / Restrictions Precautions ?Precautions: Fall ?Precaution Comments: recent carpal tunnel sx R UE ?Restrictions ?Weight Bearing Restrictions: No  ? ? ?  ? ?Mobility Bed Mobility ?Overal bed mobility: Needs Assistance ?Bed Mobility: Supine to Sit, Sit to Supine ?  ?  ?Supine to sit: Max assist ?Sit to supine: Max assist, +2 for physical assistance ?  ?  ?  ? ?Transfers ?Overall transfer level: Needs assistance ?Equipment used: Rolling walker (2 wheels) ?Transfers: Sit to/from Stand ?Sit to Stand: Max assist, +2 physical assistance, From elevated surface ?  ?  ?  ?  ?  ?General transfer comment: improved ability to grasp RW with LUE ?  ?  ?Balance Overall balance assessment: Needs assistance ?Sitting-balance support: Feet supported, Bilateral upper extremity supported ?Sitting balance-Leahy Scale: Fair ?Sitting balance - Comments: CGA decreasing to MIN A as pt fatigues ?  ?Standing balance support: Bilateral upper extremity supported, Reliant on assistive device for balance ?Standing balance-Leahy Scale: Zero ?  ?  ?  ?  ?  ?  ?  ?  ?  ?  ?  ?  ?   ? ?ADL either performed or assessed with clinical judgement  ? ?ADL Overall ADL's : Needs assistance/impaired ?  ?  ?  ?  ?  ?  ?  ?  ?  ?  ?  ?  ?  ?  ?  ?  ?  ?  ?  ?General ADL Comments: MAX A don B  socks at bed level. MAX A x2 + RW for ADL t/f ?  ? ? ? ?Cognition Arousal/Alertness: Awake/alert ?Behavior During Therapy: [facility] for tasks assessed/performed ?Overall Cognitive Status: Within Functional Limits for tasks assessed ?  ?  ?  ?  ?  ?  ?  ?  ?  ?  ?  ?  ?  ?  ?  ?  ?  ?  ?  ?   ?Exercises Exercises: Other exercises ?Other Exercises ?Other Exercises: Yellow therapuuty (provided) and yellow theraband  (provided) with exercises reviewed. LUE AROM and self-ROM exercises reviewed ? ?  ? ?Pertinent Vitals/ Pain       Pain Assessment ?Pain Assessment: Faces ?Faces Pain Scale: Hurts little more ?Pain Location: L shoulder ?Pain Descriptors / Indicators: Discomfort, Guarding, Grimacing ?Pain Intervention(s): Limited activity within patient's tolerance, Repositioned ? ? ?Frequency ? Min 4X/week  ? ? ? ? ?  ?Progress Toward Goals ? ?OT Goals(current goals can now be found in the care plan section) ? Progress towards OT goals: Progressing toward goals ? ?Acute Rehab OT Goals ?Patient Stated Goal: to walk ?OT Goal Formulation: With patient/family ?Time For Goal Achievement: 08/06/21 ?Potential to Achieve Goals: Good ?ADL Goals ?Pt Will Perform Grooming: standing;with min assist ?Pt Will Perform Lower Body Dressing: with min assist;sit to/from stand ?Pt Will Transfer to Toilet: ambulating;bedside commode;with min assist  ?Plan Discharge plan remains appropriate;Frequency remains appropriate   ? ?Co-evaluation ? ? ?   ?Reason for Co-Treatment: Complexity of the patient's impairments (multi-system involvement);For patient/therapist safety;To address functional/ADL transfers ?PT goals addressed during session: Mobility/safety with mobility;Balance ?OT goals addressed during session: ADL's and self-care;Strengthening/ROM ?  ? ?  ?AM-PAC OT "6 Clicks" Daily Activity     ?Outcome Measure ? ? Help from another person eating meals?: A Little ?Help from another person taking care of personal grooming?: A Lot ?Help from another person toileting, which includes using toliet, bedpan, or urinal?: A Lot ?Help from another person bathing (including washing, rinsing, drying)?: A Lot ?Help from another person to put on and taking off regular upper body clothing?: A Little ?Help from another person to put on and taking off regular lower body clothing?: A Lot ?6 Click Score: 14 ? ?  ?End of Session Equipment Utilized During Treatment: Rolling  walker (2 wheels) ? ?OT Visit Diagnosis: Other abnormalities of gait and mobility (R26.89);Muscle weakness (generalized) (M62.81);Hemiplegia and hemiparesis ?Hemiplegia - Right/Left: Left ?Hemiplegia - dominant/non-dominant: Non-Dominant ?Hemiplegia - caused by: Cerebral infarction ?  ?Activity Tolerance Patient tolerated treatment well ?  ?Patient Left in bed;with call bell/phone within reach;with family/visitor present ?  ?Nurse Communication   ?  ? ?   ? ?Time: 0109-3235 ?OT Time Calculation (min): 34 min ? ?Charges: OT General Charges ?$OT Visit: 1 Visit ?OT Treatments ?$Self Care/Home Management : 8-22 mins ?$Therapeutic Exercise: 8-22 mins ? ?Dessie Coma, M.S. OTR/L  ?07/25/21, 2:14 PM  ?ascom [phone] ? ?

## 2021-07-25 NOTE — Progress Notes (Signed)
?   07/25/21 1400  ?Clinical Encounter Type  ?Visited With Patient and family together  ?Visit Type Social support  ?Referral From Nurse  ?Spiritual Encounters  ?Spiritual Needs Other (Comment) ?(Advance Directive Completed)  ? ?Chaplain facilitated completion of Advance Directive ?

## 2021-07-25 NOTE — Progress Notes (Addendum)
Physical Therapy Treatment ?Patient Details ?Name: Natasha [name] ?MRN: [id number] ?DOB: [birth year] ?Today's Date: 07/25/2021 ? ? ?History of Present Illness 81 y.o. female with history of atrial fibrillation on Eliquis, CHF, hypertension, hyperlipidemia, diabetes, COPD, chronic kidney disease, lung CA, depression, and recent R carpal tunnel release (Jul 15, 2021), who presents to the emergency department with complaints of vertigo. 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 ? ?  ?PT Comments  ? ? Pt awake and alert receiving breakfast in bed from her husband at bedside. She denies any c/o pain at rest and is willing to work w/ PT. Pt was able to perform General LE exercises in bed prior to further mobility/transfers. After completion of General LE Exercises, OT entered room for assistance w/ mobility/transfers. Supine to sit was performed w/ maxAx2 to sit EOB. Pt was able to tolerate static sitting EOB w/ CGA for ~98min and was able to demonstrate ability to scoot towards EOB, in which she required assistance to perform prior. She was able to perform sit to stand w/ maxAx2 and RW (3 bouts w/ ~45 seconds rest inbetween); with verbal cues required for UE placement and utilization of UE and LE to assist w/ standing. Pt will benefit from continued skilled PT in order to improve LE strength, mobility/gait, and restore PLOF. Current discharge recommendation remains appropriate due to the level of assistance required by the patient to ensure safety and improve overall function. ?  ?Recommendations for follow up therapy are one component of a multi-disciplinary discharge planning process, led by the attending physician.  Recommendations may be updated based on patient status, additional functional criteria and insurance authorization. ? ?Follow Up Recommendations ? Skilled  nursing-short term rehab (<3 hours/day) ?  ?  ?Assistance Recommended at Discharge Frequent or constant Supervision/Assistance  ?Patient can return home with the following Help with stairs or ramp for entrance;Assist for transportation;Assistance with cooking/housework;Two people to help with walking and/or transfers;Direct supervision/assist for medications management;Direct supervision/assist for financial management;Two people to help with bathing/dressing/bathroom ?  ?Equipment Recommendations ? Rolling walker (2 wheels)  ?  ?Recommendations for Other Services   ? ? ?  ?Precautions / Restrictions Precautions ?Precautions: Fall ?Precaution Comments: recent carpal tunnel sx R UE ?Restrictions ?Weight Bearing Restrictions: No  ?  ? ?Mobility ? Bed Mobility ?Overal bed mobility: Needs Assistance ?Bed Mobility: Supine to Sit, Sit to Supine ?  ?  ?Supine to sit: Max assist, HOB elevated ?Sit to supine: Max assist ?  ?General bed mobility comments: Max assist to progress BLEs into bed and reposition to [facility] ?  ? ?Transfers ?Overall transfer level: Needs assistance ?Equipment used: Rolling walker (2 wheels) ?Transfers: Sit to/from Stand, Bed to chair/wheelchair/BSC ?Sit to Stand: Max assist, +2 physical assistance ?  ?  ?  ?  ?  ?  ?  ? ?Ambulation/Gait ?  ?  ?  ?  ?  ?  ?  ?  ? ? ?Stairs ?  ?  ?  ?  ?  ? ? ?Wheelchair Mobility ?  ? ?Modified Rankin (Stroke Patients Only) ?  ? ? ?  ?Balance Overall balance assessment: Needs assistance ?Sitting-balance support: Feet supported, Bilateral upper extremity supported ?Sitting balance-Leahy Scale: Poor ?Sitting balance - Comments: CGA decreasing to MIN A as pt fatigues ?  ?Standing balance support: Single extremity supported ?Standing balance-Leahy Scale: Zero ?  ?  ?  ?  ?  ?  ?  ?  ?  ?  ?  ?  ?  ? ?  ?  Cognition   ?  ?  ?  ?  ?  ?  ?  ?  ?  ?  ?  ?  ?  ?  ?  ?  ?  ?  ?  ?  ?  ? ?  ?Exercises General Exercises - Lower Extremity ?Heel Slides: AROM, Both, 10 reps ?Hip  ABduction/ADduction: AROM, Both, 10 reps ?Straight Leg Raises: AAROM, Both, 10 reps ? ?  ?General Comments   ?  ?  ? ?Pertinent Vitals/Pain Pain Assessment ?Pain Assessment: Faces ?Faces Pain Scale: Hurts even more ?Pain Location: b/l shoulders ?Pain Descriptors / Indicators: Discomfort, Guarding, Grimacing ?Pain Intervention(s): Limited activity within patient's tolerance  ? ? ?Home Living   ?  ?  ?  ?  ?  ?  ?  ?  ?  ?   ?  ?Prior Function    ?  ?  ?   ? ?PT Goals (current goals can now be found in the care plan section) Progress towards PT goals: Progressing toward goals ? ?  ?Frequency ? ? ? 7X/week ? ? ? ?  ?PT Plan Current plan remains appropriate  ? ? ?Co-evaluation PT/OT/SLP Co-Evaluation/Treatment: Yes ?Reason for Co-Treatment: Complexity of the patient's impairments (multi-system involvement);For patient/therapist safety;To address functional/ADL transfers ?PT goals addressed during session: Mobility/safety with mobility;Balance ?OT goals addressed during session: ADL's and self-care;Strengthening/ROM ?  ? ?  ?AM-PAC PT "6 Clicks" Mobility   ?Outcome Measure ? Help needed turning from your back to your side while in a flat bed without using bedrails?: A Lot ?Help needed moving from lying on your back to sitting on the side of a flat bed without using bedrails?: A Lot ?Help needed moving to and from a bed to a chair (including a wheelchair)?: Total ?Help needed standing up from a chair using your arms (e.g., wheelchair or bedside chair)?: Total ?Help needed to walk in hospital room?: Total ?Help needed climbing 3-5 steps with a railing? : Total ?6 Click Score: 8 ? ?  ?End of Session Equipment Utilized During Treatment: Gait belt ?Activity Tolerance: Patient limited by fatigue;Patient limited by pain ?Patient left: in bed;with call bell/phone within reach;with bed alarm set;with family/visitor present ?Nurse Communication: Mobility status ?PT Visit Diagnosis: Unsteadiness on feet (R26.81);Other abnormalities  of gait and mobility (R26.89);Difficulty in walking, not elsewhere classified (R26.2) ?  ? ? ?Time: 0940-1006 ?PT Time Calculation (min) (ACUTE ONLY): 26 min ? ?Charges:             ?          ? ?Jonnie Kind, SPT ?07/25/2021, 1:32 PM ? ?

## 2021-07-25 NOTE — Consult Note (Signed)
WOC Nurse Consult Note: ?Patient receiving care in [facility]. Assisted with turning and assessment by primary RN. ?Reason for Consult: PIs found during PIP study on 07/23/21. ?Wound type: Today the patient has an evolving DTPI on the sacrum and bilateral buttocks. Some areas have overlying skin that has peeled off.  I am sure the documentation in the flowsheet really refers to this evolving DTPI.  The entire area measures 8 x 13 cm x no measureable depth.  There is also a DTPI to the left posterior thigh. It measures 2 cm x 6 cm and is intact; no missing tissue to this one.  ?The right lateral heel has a very small DTPI that measures 1 cm x 1 cm. The left posterior heel has a 6 cm x 7 cm DTPI.  Both of the heel wounds are intact, and both have heel foam dressings in place. ?Pressure Injury POA: No ?Measurement: ?Wound bed: ?Drainage (amount, consistency, odor) none ?Periwound: intact ?Dressing procedure/placement/frequency: ?Lift the sacral foam dressing and place a Xeroform gauze Kellie Simmering [phone]) over the sacrum, then return the foam dressing into place. The Xeroform needs to be changed daily. The foam dressing can remain in place up to 3 days if it is not soiled. ? ?I have added bilateral Prevalon heel lift boots, and an order for a bed with an air mattress. ? ?Monitor the wound area(s) for worsening of condition such as: ?Signs/symptoms of infection,  ?Increase in size,  ?Development of or worsening of odor, ?Development of pain, or increased pain at the affected locations.  Notify the medical team if any of these develop. ? ?Thank you for the consult.  Discussed plan of care with the patient and bedside nurse.  [place] nurse will not follow at this time.  Please re-consult the [place] team if needed. ? ?Val Riles, RN, MSN, CWOCN, CNS-BC, pager [phone]  ?

## 2021-07-25 NOTE — Progress Notes (Addendum)
ANTICOAGULATION CONSULT NOTE - Initial Consult ? ?Pharmacy Consult for Heparin drip ?Indication: atrial fibrillation ? ?Allergies  ?Allergen Reactions  ? Atorvastatin   ?  Muscle/joint aches  ? Lantus [Insulin Glargine] Hives  ? Lyrica [Pregabalin] Other (See Comments)  ?  Headache, disorientation  ? Lisinopril Hives and Cough  ?   ?  ? ? ?Patient Measurements: ?Height: 5\' 4"  (162.6 cm) ?Weight: 63.1 kg (139 lb 1.8 oz) ?IBW/kg (Calculated) : 54.7 ?Heparin Dosing Weight:   ? ?Vital Signs: ?Temp: 98.1 ?F (36.7 ?C) (03/03 4650) ?Temp Source: Oral (03/03 3546) ?BP: 119/42 (03/03 5681) ?Pulse Rate: 73 (03/03 0823) ? ?Labs: ?Recent Labs  ?  07/23/21 ?2751 07/24/21 ?0500 07/25/21 ?0747  ?HGB 9.4*  --  8.8*  ?HCT 29.9*  --  27.5*  ?PLT 70*  --  86*  ?CREATININE 2.08* 1.56*  --   ? ? ?Estimated Creatinine Clearance: 24.4 mL/min (A) (by C-G formula based on SCr of 1.56 mg/dL (H)). ? ? ?Medical History: ?Past Medical History:  ?Diagnosis Date  ? A-fib ([place])   ? a.) CHA2DS2-VASc Score = 6 (age x 2, sex, HTN, aortic plaque, T2DM). b.) rate/rhythm maintained on oral amiodarone + metoprolol succinate; chronically anticoagulated with full dose apixaban  ? Anemia   ? 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 Aortic atherosclerosis ([place])   ? Arthritis   ? Atrial flutter with rapid ventricular response ([place]) 02/24/2021  ? a.) in the setting of (+) SARS-CoV-2 infection; converted to NSR with increased dose of oral amiodarone.  ? Chronic cough   ? CKD (chronic kidney disease), stage III ([place])   ? Complication of anesthesia   ? COPD (chronic obstructive pulmonary disease) ([place])   ? Depression   ? Diastolic dysfunction   ? a.) TTE 04/08/2014: EF 60%; mild concentric LVH; G2DD. b.) TTE 12/21/2019: EF 60-65%, LA mildly dilated, mild-mod MR; PASP 36.8; G1DD. c.) TTE 02/25/2021: EF 60-65%; normal LV function with mild concentric LVH; G1DD  ? Diverticulitis   ? Dyspnea   ? High cholesterol   ? History of 2019 novel coronavirus disease  (COVID-19) 02/24/2021  ? History of hiatal hernia   ? History of kidney stones   ? Hypertension   ? Insomnia   ? Long term current use of anticoagulant   ? a.) apixaban  ? Lumbar spinal stenosis   ? Mild asthma   ? Murmur   ? Non-small cell carcinoma of left lung, stage 1 ([place]) 09/30/2020  ? a.) clinical stage 1 (cT1cN0cM0). b.) treated with SBRT (60 cGy over 5 fractions).  ? OSA on CPAP   ? Osteoporosis   ? Restless leg   ? Sepsis ([place])   ? T2DM (type 2 diabetes mellitus) ([place])   ? ? ?Medications:  ?Scheduled:  ?  stroke: mapping our early stages of recovery book   Does not apply Once  ? amiodarone  200 mg Oral Daily  ? calcium-vitamin D  1 tablet Oral Daily  ? chlorhexidine  5 mL Mouth/Throat BID  ? Chlorhexidine Gluconate Cloth  6 each Topical Daily  ? ferrous gluconate  324 mg Oral q AM  ? gabapentin  300 mg Oral QHS  ? insulin aspart  0-9 Units Subcutaneous TID WC  ? insulin glargine-yfgn  15 Units Subcutaneous Daily  ? polyethylene glycol  17 g Oral Daily  ? pramipexole  1 mg Oral QHS  ? rosuvastatin  10 mg Oral QHS  ? sertraline  50 mg Oral QHS  ? ?  Infusions:  ? heparin    ? ? ?Assessment: ?81 yo F starting Heparin drip for Afib. Patient on Apixaban PTA-currently holding-last dose given 2/28 at 0825 ?Hgb 8.8  plt 86  HL 0.39 aPTT 39 ? ?Will assess aPTT and Heparin level for correlation ? ?Goal of Therapy:  ?Heparin level 0.3-0.7 units/ml ?aPTT 66-102 seconds ?Monitor platelets by anticoagulation protocol: Yes ?  ?Plan:  ?No bolus per MD ?Start heparin infusion at 950 units/hr ?Check anti-Xa level in 8 hours and daily while on heparin ?Continue to monitor H&H and platelets ?-Will f/u aPTT in 8 hrs (f/u aPTT until Heparin level and aPTT are correlating  ) ? ? ?Franca Stakes A ?07/25/2021,12:13 PM ? ? ?

## 2021-07-26 DIAGNOSIS — I639 Cerebral infarction, unspecified: Secondary | ICD-10-CM | POA: Diagnosis not present

## 2021-07-26 DIAGNOSIS — Z794 Long term (current) use of insulin: Secondary | ICD-10-CM

## 2021-07-26 DIAGNOSIS — C7931 Secondary malignant neoplasm of brain: Secondary | ICD-10-CM | POA: Diagnosis not present

## 2021-07-26 DIAGNOSIS — I6350 Cerebral infarction due to unspecified occlusion or stenosis of unspecified cerebral artery: Secondary | ICD-10-CM | POA: Diagnosis not present

## 2021-07-26 DIAGNOSIS — D508 Other iron deficiency anemias: Secondary | ICD-10-CM | POA: Diagnosis not present

## 2021-07-26 LAB — CBC
HCT: 26.1 % — ABNORMAL LOW (ref 36.0–46.0)
Hemoglobin: 8.6 g/dL — ABNORMAL LOW (ref 12.0–15.0)
MCH: 28.6 pg (ref 26.0–34.0)
MCHC: 33 g/dL (ref 30.0–36.0)
MCV: 86.7 fL (ref 80.0–100.0)
Platelets: 134 10*3/uL — ABNORMAL LOW (ref 150–400)
RBC: 3.01 MIL/uL — ABNORMAL LOW (ref 3.87–5.11)
RDW: 17.2 % — ABNORMAL HIGH (ref 11.5–15.5)
WBC: 12.4 10*3/uL — ABNORMAL HIGH (ref 4.0–10.5)
nRBC: 0 % (ref 0.0–0.2)

## 2021-07-26 LAB — COMPREHENSIVE METABOLIC PANEL
ALT: 59 U/L — ABNORMAL HIGH (ref 0–44)
AST: 151 U/L — ABNORMAL HIGH (ref 15–41)
Albumin: 2.2 g/dL — ABNORMAL LOW (ref 3.5–5.0)
Alkaline Phosphatase: 215 U/L — ABNORMAL HIGH (ref 38–126)
Anion gap: 8 (ref 5–15)
BUN: 38 mg/dL — ABNORMAL HIGH (ref 8–23)
CO2: 20 mmol/L — ABNORMAL LOW (ref 22–32)
Calcium: 8.1 mg/dL — ABNORMAL LOW (ref 8.9–10.3)
Chloride: 109 mmol/L (ref 98–111)
Creatinine, Ser: 1.32 mg/dL — ABNORMAL HIGH (ref 0.44–1.00)
GFR, Estimated: 41 mL/min — ABNORMAL LOW (ref >60)
Glucose, Bld: 194 mg/dL — ABNORMAL HIGH (ref 70–99)
Potassium: 3.8 mmol/L (ref 3.5–5.1)
Sodium: 137 mmol/L (ref 135–145)
Total Bilirubin: 0.6 mg/dL (ref 0.3–1.2)
Total Protein: 4.4 g/dL — ABNORMAL LOW (ref 6.5–8.1)

## 2021-07-26 LAB — HEPARIN LEVEL (UNFRACTIONATED): Heparin Unfractionated: 0.33 IU/mL (ref 0.30–0.70)

## 2021-07-26 LAB — APTT
aPTT: 47 seconds — ABNORMAL HIGH (ref 24–36)
aPTT: 56 seconds — ABNORMAL HIGH (ref 24–36)

## 2021-07-26 LAB — GLUCOSE, CAPILLARY
Glucose-Capillary: 189 mg/dL — ABNORMAL HIGH (ref 70–99)
Glucose-Capillary: 232 mg/dL — ABNORMAL HIGH (ref 70–99)
Glucose-Capillary: 235 mg/dL — ABNORMAL HIGH (ref 70–99)

## 2021-07-26 NOTE — Progress Notes (Signed)
ANTICOAGULATION CONSULT NOTE - Initial Consult ? ?Pharmacy Consult for Heparin drip ?Indication: atrial fibrillation ? ?Allergies  ?Allergen Reactions  ? Atorvastatin   ?  Muscle/joint aches  ? Lantus [Insulin Glargine] Hives  ? Lyrica [Pregabalin] Other (See Comments)  ?  Headache, disorientation  ? Lisinopril Hives and Cough  ?   ?  ? ? ?Patient Measurements: ?Height: 5\' 4"  (162.6 cm) ?Weight: 64.4 kg (142 lb) ?IBW/kg (Calculated) : 54.7 ?Heparin Dosing Weight:   ? ?Vital Signs: ?Temp: 97.5 ?F (36.4 ?C) (03/04 0515) ?Temp Source: Oral (03/04 0515) ?BP: 120/43 (03/04 0515) ?Pulse Rate: 80 (03/04 0515) ? ?Labs: ?Recent Labs  ?  07/24/21 ?0500 07/25/21 ?0747 07/25/21 ?1221 07/25/21 ?1224 07/25/21 ?2052 07/26/21 ?6433  ?HGB  --  8.8*  --   --   --  8.6*  ?HCT  --  27.5*  --   --   --  26.1*  ?PLT  --  86*  --   --   --  134*  ?APTT  --   --   --  39* 101* 47*  ?HEPARINUNFRC  --   --  0.39  --   --  0.33  ?CREATININE 1.56*  --   --   --   --  1.32*  ? ? ? ?Estimated Creatinine Clearance: 28.9 mL/min (A) (by C-G formula based on SCr of 1.32 mg/dL (H)). ? ? ?Medical History: ?Past Medical History:  ?Diagnosis Date  ? A-fib ([place])   ? a.) CHA2DS2-VASc Score = 6 (age x 2, sex, HTN, aortic plaque, T2DM). b.) rate/rhythm maintained on oral amiodarone + metoprolol succinate; chronically anticoagulated with full dose apixaban  ? Anemia   ? 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 Aortic atherosclerosis ([place])   ? Arthritis   ? Atrial flutter with rapid ventricular response ([place]) 02/24/2021  ? a.) in the setting of (+) SARS-CoV-2 infection; converted to NSR with increased dose of oral amiodarone.  ? Chronic cough   ? CKD (chronic kidney disease), stage III ([place])   ? Complication of anesthesia   ? COPD (chronic obstructive pulmonary disease) ([place])   ? Depression   ? Diastolic dysfunction   ? a.) TTE 04/08/2014: EF 60%; mild concentric LVH; G2DD. b.) TTE 12/21/2019: EF 60-65%, LA mildly dilated, mild-mod MR; PASP 36.8; G1DD. c.)  TTE 02/25/2021: EF 60-65%; normal LV function with mild concentric LVH; G1DD  ? Diverticulitis   ? Dyspnea   ? High cholesterol   ? History of 2019 novel coronavirus disease (COVID-19) 02/24/2021  ? History of hiatal hernia   ? History of kidney stones   ? Hypertension   ? Insomnia   ? Long term current use of anticoagulant   ? a.) apixaban  ? Lumbar spinal stenosis   ? Mild asthma   ? Murmur   ? Non-small cell carcinoma of left lung, stage 1 ([place]) 09/30/2020  ? a.) clinical stage 1 (cT1cN0cM0). b.) treated with SBRT (60 cGy over 5 fractions).  ? OSA on CPAP   ? Osteoporosis   ? Restless leg   ? Sepsis ([place])   ? T2DM (type 2 diabetes mellitus) ([place])   ? ? ?Medications:  ?Scheduled:  ?  stroke: mapping our early stages of recovery book   Does not apply Once  ? amiodarone  200 mg Oral Daily  ? vitamin C  500 mg Oral BID  ? calcium-vitamin D  1 tablet Oral Daily  ? chlorhexidine  5 mL Mouth/Throat BID  ?  Chlorhexidine Gluconate Cloth  6 each Topical Daily  ? feeding supplement (GLUCERNA SHAKE)  237 mL Oral TID BM  ? ferrous gluconate  324 mg Oral q AM  ? gabapentin  300 mg Oral QHS  ? insulin aspart  0-9 Units Subcutaneous TID WC  ? insulin glargine-yfgn  15 Units Subcutaneous Daily  ? multivitamin with minerals  1 tablet Oral Daily  ? polyethylene glycol  17 g Oral Daily  ? pramipexole  1 mg Oral QHS  ? rosuvastatin  10 mg Oral QHS  ? sertraline  50 mg Oral QHS  ? zinc sulfate  220 mg Oral Daily  ? ?Infusions:  ? heparin 950 Units/hr (07/26/21 0308)  ? ? ?Assessment: ?81 yo F starting Heparin drip for Afib. Patient on Apixaban PTA-currently holding-last dose given 2/28 at 0825 ?Hgb 8.8  plt 86  HL 0.39 aPTT 39 ? ?Will assess aPTT and Heparin level for correlation ? ?3/3:  aPTT @ 2052 = 101, therapeutic  X 1  ?3/4:  aPTT @ 0648 = 47, subtherapeutic(HL: 0.33) ? ?Goal of Therapy:  ?Heparin level 0.3-0.7 units/ml ?aPTT 66-102 seconds ?Monitor platelets by anticoagulation protocol: Yes ?  ?Plan: ?Increase heparin infusion  to 1100 units/hr(no bolus per Neurology) ?Platelets and Hgb remain stable ?Will draw aPTT and HL in 8 hrs after rate change ?Will use aPTT to guide dosing until aPTT and HL correlate.  ? ?Natasha [name] ?07/26/2021,7:46 AM ? ? ?

## 2021-07-26 NOTE — Progress Notes (Signed)
Physical Therapy Treatment ?Patient Details ?Name: Natasha [name] ?MRN: [id number] ?DOB: Feb 27, 1941 ?Today's Date: 07/26/2021 ? ? ?History of Present Illness 81 y.o. female with history of atrial fibrillation on Eliquis, CHF, hypertension, hyperlipidemia, diabetes, COPD, chronic kidney disease, lung CA, depression, and recent R carpal tunnel release (Jul 15, 2021), who presents to the emergency department with complaints of vertigo. 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 ? ?  ?PT Comments  ? ? Pt's daughter stopped author in hallway requesting pt be repositioning. Upon arriving to room, pt had slide down towards FOB. Pt is alert and oriented and agreeable to session. Author does question pt's effort throughout session even though she did perform desired task requested of her. Pt was able to roll L to short sit with increased time and mod assist. After EOB/OOB activity, pt required max assist to return to supine. Pt stood 1 x EOB > RW with max assist of one. Stood for ~ 45 sec with poor posture. Pain and fatigue limiting. Pt did tolerate EOB there ex and supine exercises. Lengthy discussion with pt and family about rehab and management of expectations. Pt/family state understanding and were appreciative of the information. Neurologist arrived at conclusion of PT session. Acute PT continues to recommend DC to SNF.   ?  ?Recommendations for follow up therapy are one component of a multi-disciplinary discharge planning process, led by the attending physician.  Recommendations may be updated based on patient status, additional functional criteria and insurance authorization. ? ?Follow Up Recommendations ? Skilled nursing-short term rehab (<3 hours/day) ?  ?  ?Assistance Recommended at Discharge Frequent or constant Supervision/Assistance  ?Patient can return home with the  following Help with stairs or ramp for entrance;Assist for transportation;Assistance with cooking/housework;Two people to help with walking and/or transfers;Direct supervision/assist for medications management;Direct supervision/assist for financial management;Two people to help with bathing/dressing/bathroom ?  ?Equipment Recommendations ? Other (comment) (defer to next level of care. May need w/c)  ?  ?   ?Precautions / Restrictions Precautions ?Precautions: Fall ?Precaution Comments: recent carpal tunnel sx R UE ?Restrictions ?Weight Bearing Restrictions: No  ?  ? ?Mobility ? Bed Mobility ?Overal bed mobility: Needs Assistance ?Bed Mobility: Rolling, Supine to Sit, Sit to Supine ?Rolling: Min assist, Mod assist ?  ?Supine to sit: Mod assist ?Sit to supine: Max assist ?  ?General bed mobility comments: Pt rolled L to short sit with increased time and mod assist. Max assist after EOB activity to retunr to supine in bed and reposition to [facility]. ?  ? ?Transfers ?Overall transfer level: Needs assistance ?Equipment used: Rolling walker (2 wheels) ?Transfers: Sit to/from Stand ?Sit to Stand: Max assist, From elevated surface ?  ?  ?  ?  ?  ?General transfer comment: Pt stood 1 x EOB to RW with poor standing tolerance and standing posture. Author elecated not to stand a 2nd trial. perform seated EOB exercises prior to pt returning to supine in bed. ?  ? ?Ambulation/Gait ?  ?   ?General Gait Details: unable ? ?  ?Balance Overall balance assessment: Needs assistance ?Sitting-balance support: Feet supported, Bilateral upper extremity supported ?Sitting balance-Leahy Scale: Fair ?Sitting balance - Comments: sat EOB x ~ 15 minutes total with close supervision. feet and LUE support only ?  ?Standing balance support: Bilateral upper extremity supported, Reliant on assistive device for balance ?Standing balance-Leahy Scale: Poor ?Standing  balance comment: pt tolerated standing x ~ 45 sec with L lateral lean. Vcs throughout to  erect posture. Pt continues to be high fall risk. ?  ?  ?   ?Cognition Arousal/Alertness: Awake/alert ?Behavior During Therapy: [facility] for tasks assessed/performed ?Overall Cognitive Status: Within Functional Limits for tasks assessed ?  ?   ?  ?  ?General Comments: Pt is A and O x 4. Has flat affect but is able to follow commands consistently throughout. Author does question pt's effort put forth throughout session ?  ?  ? ?  ?   ?General Comments General comments (skin integrity, edema, etc.): Lengthy discussion with pt/pt's spouse/pt's daughter about expectations going forward. ?  ?  ? ?Pertinent Vitals/Pain Pain Assessment ?Pain Assessment: No/denies pain ?Pain Score: 4  ?Faces Pain Scale: Hurts little more ?Pain Location: LBP ?Pain Descriptors / Indicators: Discomfort, Guarding, Grimacing ?Pain Intervention(s): Monitored during session, Repositioned, Limited activity within patient's tolerance  ? ? ? ?PT Goals (current goals can now be found in the care plan section) Acute Rehab PT Goals ?Patient Stated Goal: return home ?Progress towards PT goals: Not progressing toward goals - comment (pt) ? ?  ?Frequency ? ? ? 7X/week ? ? ? ?  ?PT Plan Current plan remains appropriate  ? ? ?Co-evaluation   ?  ?PT goals addressed during session: Mobility/safety with mobility;Balance;Proper use of DME;Strengthening/ROM ?  ?  ? ?  ?AM-PAC PT "6 Clicks" Mobility   ?Outcome Measure ? Help needed turning from your back to your side while in a flat bed without using bedrails?: A Lot ?Help needed moving from lying on your back to sitting on the side of a flat bed without using bedrails?: A Lot ?Help needed moving to and from a bed to a chair (including a wheelchair)?: Total ?Help needed standing up from a chair using your arms (e.g., wheelchair or bedside chair)?: Total ?Help needed to walk in hospital room?: Total ?Help needed climbing 3-5 steps with a railing? : Total ?6 Click Score: 8 ? ?  ?End of Session   ?Activity Tolerance:  Patient tolerated treatment well;Patient limited by fatigue ?Patient left: in bed;with call bell/phone within reach;with bed alarm set;with family/visitor present ?Nurse Communication: Mobility status ?PT Visit Diagnosis: Unsteadiness on feet (R26.81);Other abnormalities of gait and mobility (R26.89);Difficulty in walking, not elsewhere classified (R26.2) ?  ? ? ?Time: 1440-1505 ?PT Time Calculation (min) (ACUTE ONLY): 25 min ? ?Charges:  $Therapeutic Exercise: 8-22 mins ?$Therapeutic Activity: 8-22 mins          ?          ?Julaine Fusi PTA ?07/26/21, 3:36 PM  ? ?

## 2021-07-26 NOTE — Progress Notes (Addendum)
PROGRESS NOTE  Natasha [name]    DOB: 1940/11/22, 81 y.o.  [id number]    Code Status: Full Code   DOA: 07/17/2021   LOS: 8   Brief hospital course  Natasha [name] is a 81 y.o. female with a PMH significant for A-fib on Eliquis, HTN, HLD, type II DM, COPD, asthma, GERD, depression, former smoker, RLS, OSA, CKD 4, CHF, H/o lung cancer s/p radiation therapy, IDA, chronic pain. They presented from home to the ED on 07/17/2021 with dizziness upon waking up on 2/23 and was her normal self when went to bed that previous night. She had worsening of the symptoms and included left leg weakness.  Of note, pt is typically on chronic anticoagulation via Eliquis, however, this medication was held for scheduled right-sided carpal tunnel release, which occurred on 07/15/2021. In the ED, it was found that they had 2 small right cerebellar infarcts on head CT.  This was followed by brain MRI which showed numerous small acute infarcts scattered in the brain and compatible with central embolic disease.  Additionally, this imaging showed multiple bone lesions that were not previously seen on recent head imaging in 02/2021 and are consistent with metastatic disease. Neurology was consulted for further stroke evaluation and treatment.  They were treated with continued holding of Eliquis, permissive hypertension, PT/OT consult.  2/27- lymph node biopsy 2/28- patient had recurrent stroke symptoms and new lesions seen on repeat head CT.  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 3/3- repeat head CT shows expected progression of CVA lesions without new or hemorrhagic conversion. Started on IV heparin gtt without bolus  07/26/21 -stable  Assessment & Plan  Principal Problem:   Acute embolic stroke [facility]) Active Problems:   Essential hypertension   Iron deficiency anemia   OSA (obstructive sleep apnea)   Paroxysmal atrial fibrillation (HCC)   Restless leg  syndrome   CKD (chronic kidney disease) stage 4, GFR 15-29 ml/min (HCC)   HLD (hyperlipidemia)   Type II diabetes mellitus with renal manifestations (HCC)   COPD (chronic obstructive pulmonary disease) (HCC)   Chronic diastolic CHF (congestive heart failure) (HCC)   Leukocytosis   Bone lesion   Lung cancer metastatic to brain [facility])   Carpal tunnel syndrome on right   Lesion of liver   CVA (cerebral vascular accident) ([place])   Anemia   Thrombocytopenia ([place])   Pressure injury of skin   Posterior circulation stroke ([place])   Brain metastases ([place])  CVA-recurrence of new lesions and symptoms 2/28.  Pattern is significant for embolic disease in setting of held anticoagulations for recent surgery. Patient denies any new symptoms since 2/28. Repeat head CT 3/3 to monitor progression of the lesions was negative for hemorrhage or mass effect or new lesions. Denies any new symptoms. Tolerating well. - initiated on heparin gtt - restart eliquis half dose Monday  - age >34, serum Cr >1.5. her weight is also borderline at 63kg - dc aspirin -Neurology following, appreciate recommendations -Permissive hypertension, avoid hypotension -PT/OT/SLP -Stat head CT for any new emerging symptoms - monitor for new symptoms and/or bleeding over weekend and will dc to SNF Monday if remains stable  New incidental finding of bone lesions seen on head/C-spine imaging suspicious of metastatic disease- history of lung cancer s/p radiation therapy.  S/p lymph node biopsy 2/27 consistent with metastatic adenocarcinoma from lung primary - heme/onc following, appreciate recommendations  - f/u outpatient  HTN-permissive hypertension, patient's Bps have had low diastolics so  no antihypertensives added and discontinued opioid pain medications. -Titrate back on home blood pressure medications as able or indicated  PAF-Eliquis was restarted on 2/28 after being held for her recent carpal tunnel release surgery. Held when  new stroke found and then restarted again 3/3. Rate controlled.  -Continue amiodarone - continue eliquis at 2.5mg  BID when able  AKI (resolved) on CKD 4-gradual improvement Cr 2.2>2.08>1.56>1.32. appears to be close to baseline. -monitor  Type II DM   HLD- moderately well controlled. Hgb A1c goal <8 -Continue statin -Continue sliding scale -Continue Semglee 15 units daily  COPD-chronic, stable. ORA -As needed bronchodilators  HFpEF-holding diuretics  Restless leg syndrome -Continue home medication  Acute urinary retention- failed void trial and was getting frequent I/Os. Discharge with foley - f/u urology OP  OSA -Continue CPAP nightly  IDA   thrombocytopenia- platelets decreased and likely reactionary. Low risk of HIT.  131>>>>70>86>134 today - CBC am -Continue iron supplement  Carpal tunnel syndrome on right s/p release on 2/21. Ortho evaluated and removed sutures 3/2. Endorses feeling improvement - PT/OT  Depression- chronic, stable -Continue home sertraline  Body mass index is 24.37 kg/m.  VTE ppx: Place and maintain sequential compression device Start: 07/18/21 0658 SCDs Start: 07/17/21 0525   Diet:     Diet   Diet Carb Modified Fluid consistency: Thin; Room service appropriate? Yes with Assist   Subjective 07/26/21    Pt reports no complaints today. She is feeling much better.    Objective   Vitals:   07/25/21 2045 07/26/21 0500 07/26/21 0515 07/26/21 0758  BP: (!) 111/42  (!) 120/43 (!) 134/50  Pulse: 80  80 80  Resp: 16  18 18   Temp: 98.1 F (36.7 C)  (!) 97.5 F (36.4 C) 98.1 F (36.7 C)  TempSrc: Oral  Oral Oral  SpO2: 93%  95% 99%  Weight:  64.4 kg    Height:        Intake/Output Summary (Last 24 hours) at 07/26/2021 0802 Last data filed at 07/26/2021 0519 Gross per 24 hour  Intake 376 ml  Output 1200 ml  Net -824 ml    Filed Weights   07/20/21 0500 07/23/21 0519 07/26/21 0500  Weight: 61 kg 63.1 kg 64.4 kg     Physical Exam:   General: awake, alert, NAD HEENT: atraumatic, clear conjunctiva, anicteric sclera, MMM, hearing grossly normal Respiratory: normal respiratory effort. Cardiovascular: quick capillary refill  Nervous: A&O x3. Moving all extremities, normal speech Extremities: moves all equally, no edema, normal tone Skin: dry, intact, normal temperature, normal color. No rashes, lesions or ulcers on exposed skin Psychiatry: normal mood, congruent affect  Labs   I have personally reviewed the following labs and imaging studies CBC    Component Value Date/Time   WBC 12.4 (H) 07/26/2021 0648   RBC 3.01 (L) 07/26/2021 0648   HGB 8.6 (L) 07/26/2021 0648   HCT 26.1 (L) 07/26/2021 0648   PLT 134 (L) 07/26/2021 0648   MCV 86.7 07/26/2021 0648   MCH 28.6 07/26/2021 0648   MCHC 33.0 07/26/2021 0648   RDW 17.2 (H) 07/26/2021 0648   LYMPHSABS 1.3 06/16/2021 1039   MONOABS 0.6 06/16/2021 1039   EOSABS 0.1 06/16/2021 1039   BASOSABS 0.0 06/16/2021 1039   BMP Latest Ref Rng & Units 07/26/2021 07/24/2021 07/23/2021  Glucose 70 - 99 mg/dL 09/22/2021) 286(L) 519(W)  BUN 8 - 23 mg/dL 242(R) 98(S) 69(N)  Creatinine 0.44 - 1.00 mg/dL 96(V) 2.27(N) 3.75(G)  BUN/Creat Ratio 6 -  22 (calc) - - -  Sodium 135 - 145 mmol/L 137 135 134(L)  Potassium 3.5 - 5.1 mmol/L 3.8 3.9 4.1  Chloride 98 - 111 mmol/L 109 107 104  CO2 22 - 32 mmol/L 20(L) 19(L) 19(L)  Calcium 8.9 - 10.3 mg/dL 8.1(L) 8.2(L) 8.0(L)   CT HEAD WO CONTRAST (5MM)  Result Date: 07/25/2021 CLINICAL DATA:  81 year old female with recent neurologic deficit and embolic appearing infarcts scattered throughout the brain on 07/22/2021. EXAM: CT HEAD WITHOUT CONTRAST TECHNIQUE: Contiguous axial images were obtained from the base of the skull through the vertex without intravenous contrast. RADIATION DOSE REDUCTION: This exam was performed according to the departmental dose-optimization program which includes automated exposure control, adjustment of the mA and/or kV according  to patient size and/or use of iterative reconstruction technique. COMPARISON:  Brain MRI 07/22/2021.  Head CT 07/17/2021. FINDINGS: Brain: Development of cytotoxic edema corresponding to the DWI positive infarcts on the recent MRI, most conspicuous in the left occipital pole (series 2, image 12), right centrum semiovale (image 18), and cerebellum (image 9). No associated hemorrhage. No mass effect. Stable gray-white matter differentiation otherwise. No ventriculomegaly. Normal basilar cisterns. Vascular: Calcified atherosclerosis at the skull base. No suspicious intracranial vascular hyperdensity. Skull: Osteopenia.  No acute osseous abnormality identified. Sinuses/Orbits: Trace fluid layering in the left sphenoid sinus. Other Visualized paranasal sinuses and mastoids are clear. Other: No acute orbit or scalp soft tissue finding. IMPRESSION: 1. Expected CT appearance of the scattered bilateral infarcts on 07/22/2021 MRI. No associated hemorrhage or mass effect. 2. No new intracranial abnormality. Electronically Signed   By: Genevie Ann M.D.   On: 07/25/2021 08:56     Disposition Plan & Communication  Patient status: Inpatient  Admitted From: Home Planned disposition location: Skilled nursing facility Anticipated discharge date: 3/6 pending no new stroke symptoms and tolerating anticoagulation, neuro clearance, and not needing oncology inpatient workup  Family Communication: none   Author: Richarda Osmond, DO [facility] 07/26/2021, 8:02 AM   Available by Epic secure chat 7AM-7PM. If 7PM-7AM, please contact night-coverage.  [facility] contact information found on [email or web address].

## 2021-07-26 NOTE — Progress Notes (Signed)
ANTICOAGULATION CONSULT NOTE - Initial Consult ? ?Pharmacy Consult for Heparin drip ?Indication: atrial fibrillation ? ?Allergies  ?Allergen Reactions  ? Atorvastatin   ?  Muscle/joint aches  ? Lantus [Insulin Glargine] Hives  ? Lyrica [Pregabalin] Other (See Comments)  ?  Headache, disorientation  ? Lisinopril Hives and Cough  ?   ?  ? ? ?Patient Measurements: ?Height: 5\' 4"  (162.6 cm) ?Weight: 64.4 kg (142 lb) ?IBW/kg (Calculated) : 54.7 ?Heparin Dosing Weight: 62.1kg  ? ?Vital Signs: ?Temp: 98.6 ?F (37 ?C) (03/04 1627) ?Temp Source: Oral (03/04 7253) ?BP: 121/52 (03/04 1627) ?Pulse Rate: 86 (03/04 1627) ? ?Labs: ?Recent Labs  ?  07/24/21 ?0500 07/25/21 ?0747 07/25/21 ?1221 07/25/21 ?1224 07/25/21 ?2052 07/26/21 ?6644 07/26/21 ?1608  ?HGB  --  8.8*  --   --   --  8.6*  --   ?HCT  --  27.5*  --   --   --  26.1*  --   ?PLT  --  86*  --   --   --  134*  --   ?APTT  --   --   --    < > 101* 47* 56*  ?HEPARINUNFRC  --   --  0.39  --   --  0.33  --   ?CREATININE 1.56*  --   --   --   --  1.32*  --   ? < > = values in this interval not displayed.  ? ? ? ?Estimated Creatinine Clearance: 28.9 mL/min (A) (by C-G formula based on SCr of 1.32 mg/dL (H)). ? ? ?Medical History: ?Past Medical History:  ?Diagnosis Date  ? A-fib ([place])   ? a.) CHA2DS2-VASc Score = 6 (age x 2, sex, HTN, aortic plaque, T2DM). b.) rate/rhythm maintained on oral amiodarone + metoprolol succinate; chronically anticoagulated with full dose apixaban  ? Anemia   ? 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 Aortic atherosclerosis ([place])   ? Arthritis   ? Atrial flutter with rapid ventricular response ([place]) 02/24/2021  ? a.) in the setting of (+) SARS-CoV-2 infection; converted to NSR with increased dose of oral amiodarone.  ? Chronic cough   ? CKD (chronic kidney disease), stage III ([place])   ? Complication of anesthesia   ? COPD (chronic obstructive pulmonary disease) ([place])   ? Depression   ? Diastolic dysfunction   ? a.) TTE 04/08/2014: EF 60%; mild  concentric LVH; G2DD. b.) TTE 12/21/2019: EF 60-65%, LA mildly dilated, mild-mod MR; PASP 36.8; G1DD. c.) TTE 02/25/2021: EF 60-65%; normal LV function with mild concentric LVH; G1DD  ? Diverticulitis   ? Dyspnea   ? High cholesterol   ? History of 2019 novel coronavirus disease (COVID-19) 02/24/2021  ? History of hiatal hernia   ? History of kidney stones   ? Hypertension   ? Insomnia   ? Long term current use of anticoagulant   ? a.) apixaban  ? Lumbar spinal stenosis   ? Mild asthma   ? Murmur   ? Non-small cell carcinoma of left lung, stage 1 ([place]) 09/30/2020  ? a.) clinical stage 1 (cT1cN0cM0). b.) treated with SBRT (60 cGy over 5 fractions).  ? OSA on CPAP   ? Osteoporosis   ? Restless leg   ? Sepsis ([place])   ? T2DM (type 2 diabetes mellitus) ([place])   ? ? ?Medications:  ?Scheduled:  ?  stroke: mapping our early stages of recovery book   Does not apply Once  ? amiodarone  200 mg Oral Daily  ? vitamin C  500 mg Oral BID  ? calcium-vitamin D  1 tablet Oral Daily  ? chlorhexidine  5 mL Mouth/Throat BID  ? Chlorhexidine Gluconate Cloth  6 each Topical Daily  ? feeding supplement (GLUCERNA SHAKE)  237 mL Oral TID BM  ? ferrous gluconate  324 mg Oral q AM  ? gabapentin  300 mg Oral QHS  ? insulin aspart  0-9 Units Subcutaneous TID WC  ? insulin glargine-yfgn  15 Units Subcutaneous Daily  ? multivitamin with minerals  1 tablet Oral Daily  ? polyethylene glycol  17 g Oral Daily  ? pramipexole  1 mg Oral QHS  ? rosuvastatin  10 mg Oral QHS  ? sertraline  50 mg Oral QHS  ? zinc sulfate  220 mg Oral Daily  ? ?Infusions:  ? heparin 1,100 Units/hr (07/26/21 1421)  ? ? ?Assessment: ?81 yo F starting Heparin drip for Afib. Patient on Apixaban PTA-currently holding-last dose given 2/28 at 0825 ? ?Baseline Labs: ?Hgb 8.8  plt 86  HL 0.39 aPTT 39 ? ? ? ?3/3:  aPTT @ 2052 = 101, therapeutic  X 1  ?3/4:  aPTT @ 0648 = 47, subtherapeutic(HL: 0.33) ?3/4 aPTT @ 1600 = 56, subtherapeutic ? ?Goal of Therapy:  ?Heparin level 0.3-0.7  units/ml ?aPTT 66-102 seconds ?Monitor platelets by anticoagulation protocol: Yes ?  ?Plan: ?Increase heparin infusion to 1200 units/hr (no bolus per Neurology) ?Will draw aPTT and HL in 8 hrs after rate change ?Will use aPTT to guide dosing until aPTT and HL correlate.  ? ?Darrick Penna ?07/26/2021,4:42 PM ? ? ?

## 2021-07-26 NOTE — Progress Notes (Signed)
S: No new deficits or complaints today. Head CT 3/3 showed no interval hemorrhagic conversion. On heparin gtt. Platelets 134 today. ? ?O: ? ?Vitals:  ? 07/26/21 0515 07/26/21 0758  ?BP: (!) 120/43 (!) 134/50  ?Pulse: 80 80  ?Resp: 18 18  ?Temp: (!) 97.5 ?F (36.4 ?C) 98.1 ?F (36.7 ?C)  ?SpO2: 95% 99%  ? ? ?Physical Exam ?Gen: A&Ox4, NAD ?HEENT: Atraumatic, normocephalic; oropharynx clear, tongue without atrophy or fasciculations. ?Resp: CTAB, normal work of breathing ?CV: RRR, extremities appear well-perfused. ?Abd: soft/NT/ND ?Extrem: Nml bulk; no cyanosis, clubbing, or edema. ? ?Neuro: ?*MS: A&O x4. Follows multi-step commands.  ?*Speech: no dysarthria or aphasia, able to name and repeat. ?*CN:  ?  I: Deferred ?  II,III: PERRLA, RHH, optic discs not visualized 2/2 pupillary constriction ?  III,IV,VI: EOMI w/o nystagmus, no ptosis ?  V: Sensation intact from V1 to V3 to LT ?  VII: Eyelid closure was full.  L UMN facial droop ?  VIII: Hearing intact to voice ?  IX,X: Voice normal, palate elevates symmetrically  ?  XI: SCM/trap 5/5 bilat   ?XII: Tongue protrudes midline, no atrophy or fasciculations  ?*Motor:   Normal bulk.  No tremor, rigidity or bradykinesia. No pronator drift. RUE full strength throughout, LUE 4/5 diffusely with drift but not to bed, able to wiggle toes BLE but no movement anti-gravity ?*Sensory: SILT ?*Coordination:  ataxia L FNF ?*Reflexes:  1+ and symmetric throughout without clonus; toes down-going bilat ?*Gait: deferred ? ?NIHSS ?1a Level of Conscious.: 0 ?1b LOC Questions: 0 ?1c LOC Commands: 0 ?2 Best Gaze: 0 ?3 Visual: 1 ?4 Facial Palsy: 1 ?5a Motor Arm - left: 1 ?5b Motor Arm - Right: 0 ?6a Motor Leg - Left: 2 ?6b Motor Leg - Right: 2 ?7 Limb Ataxia: 1 ?8 Sensory: 0 ?9 Best Language: 0 ?10 Dysarthria: 0 ?11 Extinct. and Inatten.: 0 ? ?TOTAL: 8 ? ?A/P: 81 year old female with a history of atrial flutter who is on anticoagulation, but this had been held due to surgery with strokes consistent  with emboli in the setting of holding Eliquis. Her eliquis was held to reduce the risk of hemorrhagic conversion but on 2/28 this week she again had recurrent infarcts resulting in [facility] and BLE weakness (now improving). Her anticoagulation was held for 3 more days (typically would wait 5 but given that she had recurrent embolizations 3 days was felt to represent the optimal risk/benefit time period). Head CT 3/3 showed no interval hemorrhagic conversion and platelets improved to 87 therefore she was restarted on anticoagulation with heparin gtt no bolus to be continued through the weekend after which she will be transitioned to eliquis 2.$RemoveBefo'5mg'MPxGQiTxHiR$  bid. Platelets are now improved at 137.  ? ?- Continue heparin gtt no bolus ?- Plan to transition to eliquis 2.$RemoveBefo'5mg'jADSICwekPA$  bid on monday ?- Goal normotension, strict avoidance of hypotension ?- STAT head CT for any change in exam ?- PT/OT/SLP ? ?No further neuro workup indicated as inpatient. Neurology to sign off, but please re-engage if additional neurologic concerns arise. ? ?Su Monks, MD ?[facility] ?[phone] ? ?If 7pm- 7am, please page neurology on call as listed in [place]. ? ?

## 2021-07-27 DIAGNOSIS — G5601 Carpal tunnel syndrome, right upper limb: Secondary | ICD-10-CM | POA: Diagnosis not present

## 2021-07-27 DIAGNOSIS — C7931 Secondary malignant neoplasm of brain: Secondary | ICD-10-CM | POA: Diagnosis not present

## 2021-07-27 DIAGNOSIS — I639 Cerebral infarction, unspecified: Secondary | ICD-10-CM | POA: Diagnosis not present

## 2021-07-27 DIAGNOSIS — M899 Disorder of bone, unspecified: Secondary | ICD-10-CM | POA: Diagnosis not present

## 2021-07-27 LAB — CBC
HCT: 27.4 % — ABNORMAL LOW (ref 36.0–46.0)
Hemoglobin: 8.9 g/dL — ABNORMAL LOW (ref 12.0–15.0)
MCH: 28 pg (ref 26.0–34.0)
MCHC: 32.5 g/dL (ref 30.0–36.0)
MCV: 86.2 fL (ref 80.0–100.0)
Platelets: 210 10*3/uL (ref 150–400)
RBC: 3.18 MIL/uL — ABNORMAL LOW (ref 3.87–5.11)
RDW: 17.5 % — ABNORMAL HIGH (ref 11.5–15.5)
WBC: 15.3 10*3/uL — ABNORMAL HIGH (ref 4.0–10.5)
nRBC: 0 % (ref 0.0–0.2)

## 2021-07-27 LAB — APTT
aPTT: 106 seconds — ABNORMAL HIGH (ref 24–36)
aPTT: >200 seconds (ref 24–36)
aPTT: 157 seconds — ABNORMAL HIGH (ref 24–36)

## 2021-07-27 LAB — HEPARIN LEVEL (UNFRACTIONATED): Heparin Unfractionated: 0.49 IU/mL (ref 0.30–0.70)

## 2021-07-27 LAB — GLUCOSE, CAPILLARY
Glucose-Capillary: 167 mg/dL — ABNORMAL HIGH (ref 70–99)
Glucose-Capillary: 185 mg/dL — ABNORMAL HIGH (ref 70–99)
Glucose-Capillary: 203 mg/dL — ABNORMAL HIGH (ref 70–99)
Glucose-Capillary: 218 mg/dL — ABNORMAL HIGH (ref 70–99)

## 2021-07-27 LAB — SARS CORONAVIRUS 2 (TAT 6-24 HRS): SARS Coronavirus 2: NEGATIVE

## 2021-07-27 MED ORDER — HEPARIN (PORCINE) 25000 UT/250ML-% IV SOLN: 1000.0000 [IU]/h | INTRAVENOUS | Status: DC

## 2021-07-27 MED ORDER — HEPARIN (PORCINE) 25000 UT/250ML-% IV SOLN
950.0000 [IU]/h | INTRAVENOUS | Status: DC
Start: 2021-07-27 — End: 2021-07-28
  Administered 2021-07-27: 18:00:00 1000 [IU]/h via INTRAVENOUS
  Filled 2021-07-27: qty 250

## 2021-07-27 NOTE — Progress Notes (Signed)
ANTICOAGULATION CONSULT NOTE - Initial Consult ? ?Pharmacy Consult for Heparin drip ?Indication: atrial fibrillation ? ?Allergies  ?Allergen Reactions  ? Atorvastatin   ?  Muscle/joint aches  ? Lantus [Insulin Glargine] Hives  ? Lyrica [Pregabalin] Other (See Comments)  ?  Headache, disorientation  ? Lisinopril Hives and Cough  ?   ?  ? ? ?Patient Measurements: ?Height: 5\' 4"  (162.6 cm) ?Weight: 64.4 kg (141 lb 15.6 oz) ?IBW/kg (Calculated) : 54.7 ?Heparin Dosing Weight: 62.1kg  ? ?Vital Signs: ?Temp: 98.4 ?F (36.9 ?C) (03/05 1551) ?BP: 120/52 (03/05 1551) ?Pulse Rate: 90 (03/05 1551) ? ?Labs: ?Recent Labs  ?  07/25/21 ?0747 07/25/21 ?1221 07/25/21 ?1224 07/26/21 ?0648 07/26/21 ?1608 07/27/21 ?0056 07/27/21 ?1275 07/27/21 ?1700 07/27/21 ?1531  ?HGB 8.8*  --   --  8.6*  --   --  8.9*  --   --   ?HCT 27.5*  --   --  26.1*  --   --  27.4*  --   --   ?PLT 86*  --   --  134*  --   --  210  --   --   ?APTT  --   --    < > 47*   < > >200*  --  106* 157*  ?HEPARINUNFRC  --  0.39  --  0.33  --   --  0.49  --   --   ?CREATININE  --   --   --  1.32*  --   --   --   --   --   ? < > = values in this interval not displayed.  ? ? ? ?Estimated Creatinine Clearance: 28.9 mL/min (A) (by C-G formula based on SCr of 1.32 mg/dL (H)). ? ? ?Medical History: ?Past Medical History:  ?Diagnosis Date  ? A-fib ([place])   ? a.) CHA2DS2-VASc Score = 6 (age x 2, sex, HTN, aortic plaque, T2DM). b.) rate/rhythm maintained on oral amiodarone + metoprolol succinate; chronically anticoagulated with full dose apixaban  ? Anemia   ? 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 Aortic atherosclerosis ([place])   ? Arthritis   ? Atrial flutter with rapid ventricular response ([place]) 02/24/2021  ? a.) in the setting of (+) SARS-CoV-2 infection; converted to NSR with increased dose of oral amiodarone.  ? Chronic cough   ? CKD (chronic kidney disease), stage III ([place])   ? Complication of anesthesia   ? COPD (chronic obstructive pulmonary disease) ([place])   ? Depression    ? Diastolic dysfunction   ? a.) TTE 04/08/2014: EF 60%; mild concentric LVH; G2DD. b.) TTE 12/21/2019: EF 60-65%, LA mildly dilated, mild-mod MR; PASP 36.8; G1DD. c.) TTE 02/25/2021: EF 60-65%; normal LV function with mild concentric LVH; G1DD  ? Diverticulitis   ? Dyspnea   ? High cholesterol   ? History of 2019 novel coronavirus disease (COVID-19) 02/24/2021  ? History of hiatal hernia   ? History of kidney stones   ? Hypertension   ? Insomnia   ? Long term current use of anticoagulant   ? a.) apixaban  ? Lumbar spinal stenosis   ? Mild asthma   ? Murmur   ? Non-small cell carcinoma of left lung, stage 1 ([place]) 09/30/2020  ? a.) clinical stage 1 (cT1cN0cM0). b.) treated with SBRT (60 cGy over 5 fractions).  ? OSA on CPAP   ? Osteoporosis   ? Restless leg   ? Sepsis ([place])   ? T2DM (type 2 diabetes  mellitus) ([place])   ? ? ?Medications:  ?Scheduled:  ?  stroke: mapping our early stages of recovery book   Does not apply Once  ? amiodarone  200 mg Oral Daily  ? vitamin C  500 mg Oral BID  ? calcium-vitamin D  1 tablet Oral Daily  ? chlorhexidine  5 mL Mouth/Throat BID  ? Chlorhexidine Gluconate Cloth  6 each Topical Daily  ? feeding supplement (GLUCERNA SHAKE)  237 mL Oral TID BM  ? ferrous gluconate  324 mg Oral q AM  ? gabapentin  300 mg Oral QHS  ? insulin aspart  0-9 Units Subcutaneous TID WC  ? insulin glargine-yfgn  15 Units Subcutaneous Daily  ? multivitamin with minerals  1 tablet Oral Daily  ? polyethylene glycol  17 g Oral Daily  ? pramipexole  1 mg Oral QHS  ? rosuvastatin  10 mg Oral QHS  ? sertraline  50 mg Oral QHS  ? zinc sulfate  220 mg Oral Daily  ? ?Infusions:  ? heparin 1,200 Units/hr (07/27/21 6295)  ? ? ?Assessment: ?81 yo F starting Heparin drip for Afib. Patient on Apixaban PTA-currently holding-last dose given 2/28 at 0825 ? ?Baseline Labs: ?Hgb 8.8  plt 86  HL 0.39 aPTT 39 ? ? ?3/3:  aPTT @ 2052 = 101, therapeutic  X 1  ?3/4:  aPTT @ 0648 = 47, subtherapeutic(HL: 0.33) ?3/4 aPTT @ 1600 = 56,  subtherapeutic ?3/5 aPTT @ 0056 = > 200 ?3/5 aPTT @ 2841 = 106, therapeutic  ?3/5 aPTT @ 1531 = 157, supratherapeutic  ? ?Goal of Therapy:  ?Heparin level 0.3-0.7 units/ml ?aPTT 66-102 seconds ?Monitor platelets by anticoagulation protocol: Yes ?  ?Plan: ?aPTT 157, supratherapeutic  ?Hold heparin infusion for 1 hour and decrease infusion to 1000 units/hr ?Check aPTT and heparin level 8 hours after rate change  ?Monitor daily CBC, s/s of bleed ? ? ?Darnelle Bos, PharmD ?07/27/2021,4:11 PM ? ? ?

## 2021-07-27 NOTE — Progress Notes (Signed)
ANTICOAGULATION CONSULT NOTE - Initial Consult ? ?Pharmacy Consult for Heparin drip ?Indication: atrial fibrillation ? ?Allergies  ?Allergen Reactions  ? Atorvastatin   ?  Muscle/joint aches  ? Lantus [Insulin Glargine] Hives  ? Lyrica [Pregabalin] Other (See Comments)  ?  Headache, disorientation  ? Lisinopril Hives and Cough  ?   ?  ? ? ?Patient Measurements: ?Height: 5\' 4"  (162.6 cm) ?Weight: 64.4 kg (141 lb 15.6 oz) ?IBW/kg (Calculated) : 54.7 ?Heparin Dosing Weight: 62.1kg  ? ?Vital Signs: ?Temp: 98.2 ?F (36.8 ?C) (03/05 2924) ?Temp Source: Oral (03/04 2108) ?BP: 119/55 (03/05 0317) ?Pulse Rate: 86 (03/05 0317) ? ?Labs: ?Recent Labs  ?  07/25/21 ?0747 07/25/21 ?1221 07/25/21 ?1224 07/26/21 ?4628 07/26/21 ?1608 07/27/21 ?0056  ?HGB 8.8*  --   --  8.6*  --   --   ?HCT 27.5*  --   --  26.1*  --   --   ?PLT 86*  --   --  134*  --   --   ?APTT  --   --    < > 47* 56* >200*  ?HEPARINUNFRC  --  0.39  --  0.33  --   --   ?CREATININE  --   --   --  1.32*  --   --   ? < > = values in this interval not displayed.  ? ? ? ?Estimated Creatinine Clearance: 28.9 mL/min (A) (by C-G formula based on SCr of 1.32 mg/dL (H)). ? ? ?Medical History: ?Past Medical History:  ?Diagnosis Date  ? A-fib ([place])   ? a.) CHA2DS2-VASc Score = 6 (age x 2, sex, HTN, aortic plaque, T2DM). b.) rate/rhythm maintained on oral amiodarone + metoprolol succinate; chronically anticoagulated with full dose apixaban  ? Anemia   ? 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 Aortic atherosclerosis ([place])   ? Arthritis   ? Atrial flutter with rapid ventricular response ([place]) 02/24/2021  ? a.) in the setting of (+) SARS-CoV-2 infection; converted to NSR with increased dose of oral amiodarone.  ? Chronic cough   ? CKD (chronic kidney disease), stage III ([place])   ? Complication of anesthesia   ? COPD (chronic obstructive pulmonary disease) ([place])   ? Depression   ? Diastolic dysfunction   ? a.) TTE 04/08/2014: EF 60%; mild concentric LVH; G2DD. b.) TTE 12/21/2019:  EF 60-65%, LA mildly dilated, mild-mod MR; PASP 36.8; G1DD. c.) TTE 02/25/2021: EF 60-65%; normal LV function with mild concentric LVH; G1DD  ? Diverticulitis   ? Dyspnea   ? High cholesterol   ? History of 2019 novel coronavirus disease (COVID-19) 02/24/2021  ? History of hiatal hernia   ? History of kidney stones   ? Hypertension   ? Insomnia   ? Long term current use of anticoagulant   ? a.) apixaban  ? Lumbar spinal stenosis   ? Mild asthma   ? Murmur   ? 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 a.) clinical stage 1 (cT1cN0cM0). b.) treated with SBRT (60 cGy over 5 fractions).  ? OSA on CPAP   ? Osteoporosis   ? Restless leg   ? Sepsis ([place])   ? T2DM (type 2 diabetes mellitus) ([place])   ? ? ?Medications:  ?Scheduled:  ?  stroke: mapping our early stages of recovery book   Does not apply Once  ? amiodarone  200 mg Oral Daily  ? vitamin C  500 mg Oral BID  ? calcium-vitamin  D  1 tablet Oral Daily  ? chlorhexidine  5 mL Mouth/Throat BID  ? Chlorhexidine Gluconate Cloth  6 each Topical Daily  ? feeding supplement (GLUCERNA SHAKE)  237 mL Oral TID BM  ? ferrous gluconate  324 mg Oral q AM  ? gabapentin  300 mg Oral QHS  ? insulin aspart  0-9 Units Subcutaneous TID WC  ? insulin glargine-yfgn  15 Units Subcutaneous Daily  ? multivitamin with minerals  1 tablet Oral Daily  ? polyethylene glycol  17 g Oral Daily  ? pramipexole  1 mg Oral QHS  ? rosuvastatin  10 mg Oral QHS  ? sertraline  50 mg Oral QHS  ? zinc sulfate  220 mg Oral Daily  ? ?Infusions:  ? heparin 1,000 Units/hr (07/27/21 4982)  ? ? ?Assessment: ?81 yo F starting Heparin drip for Afib. Patient on Apixaban PTA-currently holding-last dose given 2/28 at 0825 ? ?Baseline Labs: ?Hgb 8.8  plt 86  HL 0.39 aPTT 39 ? ? ? ?3/3:  aPTT @ 2052 = 101, therapeutic  X 1  ?3/4:  aPTT @ 0648 = 47, subtherapeutic(HL: 0.33) ?3/4 aPTT @ 1600 = 56, subtherapeutic ?3/5 aPTT @ 0056 = > 200 ? ?Goal of Therapy:  ?Heparin level 0.3-0.7 units/ml ?aPTT 66-102  seconds ?Monitor platelets by anticoagulation protocol: Yes ?  ?Plan: ?3/5:  aPTT @ 0056 = > 200  ?- spoke with lab tech who said they analyzed this sample twice and came back with same result.   Also, phlebotomist stated that she drew the sample from opposite arm as heparin infusion.  Will accept this reading as valid. ? ?-  Will hold heparin drip for 1 hr and then restart at 0600 @ 1000 units/hr. ?-  Will recheck aPTT 8 hrs after restart.  ? ?3/5 @ 0645:  Spoke with lab tech who confirmed phlebotomist drew sample incorrectly on a different pt.  She suggested the previous aPTT of > 200 drawn at 0056 may have also been in error.  Will repeat aPTT @ 0700.  ? ?Marieke Lubke D ?07/27/2021,6:57 AM ? ? ?

## 2021-07-27 NOTE — Progress Notes (Signed)
ANTICOAGULATION CONSULT NOTE - Initial Consult ? ?Pharmacy Consult for Heparin drip ?Indication: atrial fibrillation ? ?Allergies  ?Allergen Reactions  ? Atorvastatin   ?  Muscle/joint aches  ? Lantus [Insulin Glargine] Hives  ? Lyrica [Pregabalin] Other (See Comments)  ?  Headache, disorientation  ? Lisinopril Hives and Cough  ?   ?  ? ? ?Patient Measurements: ?Height: 5\' 4"  (162.6 cm) ?Weight: 64.4 kg (141 lb 15.6 oz) ?IBW/kg (Calculated) : 54.7 ?Heparin Dosing Weight: 62.1kg  ? ?Vital Signs: ?Temp: 98.2 ?F (36.8 ?C) (03/05 6962) ?Temp Source: Oral (03/04 2108) ?BP: 119/55 (03/05 0317) ?Pulse Rate: 86 (03/05 0317) ? ?Labs: ?Recent Labs  ?  07/25/21 ?0747 07/25/21 ?1221 07/25/21 ?1224 07/26/21 ?0648 07/26/21 ?1608 07/27/21 ?0056 07/27/21 ?9528 07/27/21 ?4132  ?HGB 8.8*  --   --  8.6*  --   --  8.9*  --   ?HCT 27.5*  --   --  26.1*  --   --  27.4*  --   ?PLT 86*  --   --  134*  --   --  210  --   ?APTT  --   --    < > 47* 56* >200*  --  106*  ?HEPARINUNFRC  --  0.39  --  0.33  --   --  0.49  --   ?CREATININE  --   --   --  1.32*  --   --   --   --   ? < > = values in this interval not displayed.  ? ? ? ?Estimated Creatinine Clearance: 28.9 mL/min (A) (by C-G formula based on SCr of 1.32 mg/dL (H)). ? ? ?Medical History: ?Past Medical History:  ?Diagnosis Date  ? A-fib ([place])   ? a.) CHA2DS2-VASc Score = 6 (age x 2, sex, HTN, aortic plaque, T2DM). b.) rate/rhythm maintained on oral amiodarone + metoprolol succinate; chronically anticoagulated with full dose apixaban  ? Anemia   ? 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 Aortic atherosclerosis ([place])   ? Arthritis   ? Atrial flutter with rapid ventricular response ([place]) 02/24/2021  ? a.) in the setting of (+) SARS-CoV-2 infection; converted to NSR with increased dose of oral amiodarone.  ? Chronic cough   ? CKD (chronic kidney disease), stage III ([place])   ? Complication of anesthesia   ? COPD (chronic obstructive pulmonary disease) ([place])   ? Depression   ? Diastolic  dysfunction   ? a.) TTE 04/08/2014: EF 60%; mild concentric LVH; G2DD. b.) TTE 12/21/2019: EF 60-65%, LA mildly dilated, mild-mod MR; PASP 36.8; G1DD. c.) TTE 02/25/2021: EF 60-65%; normal LV function with mild concentric LVH; G1DD  ? Diverticulitis   ? Dyspnea   ? High cholesterol   ? History of 2019 novel coronavirus disease (COVID-19) 02/24/2021  ? History of hiatal hernia   ? History of kidney stones   ? Hypertension   ? Insomnia   ? Long term current use of anticoagulant   ? a.) apixaban  ? Lumbar spinal stenosis   ? Mild asthma   ? Murmur   ? Non-small cell carcinoma of left lung, stage 1 ([place]) 09/30/2020  ? a.) clinical stage 1 (cT1cN0cM0). b.) treated with SBRT (60 cGy over 5 fractions).  ? OSA on CPAP   ? Osteoporosis   ? Restless leg   ? Sepsis ([place])   ? T2DM (type 2 diabetes mellitus) ([place])   ? ? ?Medications:  ?Scheduled:  ?  stroke: mapping our early  stages of recovery book   Does not apply Once  ? amiodarone  200 mg Oral Daily  ? vitamin C  500 mg Oral BID  ? calcium-vitamin D  1 tablet Oral Daily  ? chlorhexidine  5 mL Mouth/Throat BID  ? Chlorhexidine Gluconate Cloth  6 each Topical Daily  ? feeding supplement (GLUCERNA SHAKE)  237 mL Oral TID BM  ? ferrous gluconate  324 mg Oral q AM  ? gabapentin  300 mg Oral QHS  ? insulin aspart  0-9 Units Subcutaneous TID WC  ? insulin glargine-yfgn  15 Units Subcutaneous Daily  ? multivitamin with minerals  1 tablet Oral Daily  ? polyethylene glycol  17 g Oral Daily  ? pramipexole  1 mg Oral QHS  ? rosuvastatin  10 mg Oral QHS  ? sertraline  50 mg Oral QHS  ? zinc sulfate  220 mg Oral Daily  ? ?Infusions:  ? heparin 1,000 Units/hr (07/27/21 4825)  ? ? ?Assessment: ?81 yo F starting Heparin drip for Afib. Patient on Apixaban PTA-currently holding-last dose given 2/28 at 0825 ? ?Baseline Labs: ?Hgb 8.8  plt 86  HL 0.39 aPTT 39 ? ? ? ?3/3:  aPTT @ 2052 = 101, therapeutic  X 1  ?3/4:  aPTT @ 0648 = 47, subtherapeutic(HL: 0.33) ?3/4 aPTT @ 1600 = 56,  subtherapeutic ?3/5 aPTT @ 0056 = > 200 ?3/5 aPTT @ 0037 = 106, therapeutic  ? ?Goal of Therapy:  ?Heparin level 0.3-0.7 units/ml ?aPTT 66-102 seconds ?Monitor platelets by anticoagulation protocol: Yes ?  ?Plan: ?3/5:  aPTT @ 0056 = > 200  ?- spoke with lab tech who said they analyzed this sample twice and came back with same result.   Also, phlebotomist stated that she drew the sample from opposite arm as heparin infusion.  Will accept this reading as valid. ? ?-  Will hold heparin drip for 1 hr and then restart at 0600 @ 1000 units/hr. ?-  Will recheck aPTT 8 hrs after restart.  ? ?3/5 @ 0645:  Spoke with lab tech who confirmed phlebotomist drew sample incorrectly on a different pt.  She suggested the previous aPTT of > 200 drawn at 0056 may have also been in error.  Will repeat aPTT @ 0700.  ? ?3/5 @ 0715:  Lab tech called and confirmed with results from 0056 aPTT were drawn in error ,  aPTT @ 0704 = 106, therapeutic.   ?- Will adjust heparin rate back to previous rate of 1200 units/hr. ?- Will recheck aPTT in 8 hrs on 3/5 @ 1600.  ? ?Rubina Basinski D ?07/27/2021,7:31 AM ? ? ?

## 2021-07-27 NOTE — Plan of Care (Signed)
Lab called with critical aPTT of 200.  Notified Sharion Settler, NP and Pharmacy.  Pt is on a heparin drip running at 12.  ?

## 2021-07-27 NOTE — Progress Notes (Signed)
Physical Therapy Treatment ?Patient Details ?Name: Natasha [name] ?MRN: [id number] ?DOB: December 05, 1940 ?Today's Date: 07/27/2021 ? ? ?History of Present Illness 81 y.o. female with history of atrial fibrillation on Eliquis, CHF, hypertension, hyperlipidemia, diabetes, COPD, chronic kidney disease, lung CA, depression, and recent R carpal tunnel release (Jul 15, 2021), who presents to the emergency department with complaints of vertigo. 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 ? ?  ?PT Comments  ? ? Pt received in bed. Poorly positioned due to decreased spatial awareness of L UE/LE. Pt assisted to EOB with ModA, feet on floor, B UE supported with supervision x 15 minutes. Several attempts to attain upright standing from raised bed with/without RW, pt unable to partially stand for more than 20 seconds with MaxA and blocking of L LE.  Pt assisted back to bed on R side to offload pressure sore on buttocks.  Overall good tolerance for session. Pt will benefit from short term rehab prior to returning home.  ?  ?Recommendations for follow up therapy are one component of a multi-disciplinary discharge planning process, led by the attending physician.  Recommendations may be updated based on patient status, additional functional criteria and insurance authorization. ? ?Follow Up Recommendations ? Skilled nursing-short term rehab (<3 hours/day) ?  ?  ?Assistance Recommended at Discharge Frequent or constant Supervision/Assistance  ?Patient can return home with the following Help with stairs or ramp for entrance;Assist for transportation;Assistance with cooking/housework;Two people to help with walking and/or transfers;Direct supervision/assist for medications management;Direct supervision/assist for financial management;Two people to help with bathing/dressing/bathroom ?  ?Equipment  Recommendations ? Other (comment) (TBD at next venu)  ?  ?Recommendations for Other Services   ? ? ?  ?Precautions / Restrictions Precautions ?Precautions: Fall ?Precaution Comments: recent carpal tunnel sx R UE ?Restrictions ?Weight Bearing Restrictions: No  ?  ? ?Mobility ? Bed Mobility ?Overal bed mobility: Needs Assistance ?Bed Mobility: Rolling, Supine to Sit, Sit to Supine ?Rolling: Min assist, Mod assist ?  ?Supine to sit: Mod assist ?Sit to supine: Max assist ?  ?General bed mobility comments:  (Pt positioned in R sidelying upon conclusion of session to prevent further skin breakdown at buttocks region.) ?  ? ?Transfers ?Overall transfer level: Needs assistance ?Equipment used: Rolling walker (2 wheels) ?Transfers: Sit to/from Stand ?Sit to Stand: Max assist, From elevated surface ?  ?  ?  ?  ?  ?General transfer comment:  (Attempted standing with and without RW, L LE blocked. Pt tolerated ~15 seconds, unable to attain upright standing.) ?  ? ?Ambulation/Gait ?  ?  ?  ?  ?  ?  ?  ?General Gait Details: unable ? ? ?Stairs ?  ?  ?  ?  ?  ? ? ?Wheelchair Mobility ?  ? ?Modified Rankin (Stroke Patients Only) ?  ? ? ?  ?Balance Overall balance assessment: Needs assistance ?Sitting-balance support: Feet supported, Bilateral upper extremity supported ?Sitting balance-Leahy Scale: Fair ?Sitting balance - Comments: sat EOB x ~ 15 minutes total with close supervision. feet and LUE support only ?  ?Standing balance support: Bilateral upper extremity supported, Reliant on assistive device for balance ?Standing balance-Leahy Scale: Poor ?  ?  ?  ?  ?  ?  ?  ?  ?  ?  ?  ?  ?  ? ?  ?Cognition Arousal/Alertness: Awake/alert ?Behavior During Therapy: [facility] for  tasks assessed/performed ?Overall Cognitive Status: Within Functional Limits for tasks assessed ?  ?  ?  ?  ?  ?  ?  ?  ?  ?  ?  ?  ?  ?  ?  ?  ?General Comments:  (Pt is A and O x 4 and responding appropriately to questions) ?  ?  ? ?  ?Exercises General Exercises -  Lower Extremity ?Ankle Circles/Pumps: AROM, 15 reps ?Heel Slides: AROM, Both, 10 reps ?Hip ABduction/ADduction: AROM, Both, 10 reps ? ?  ?General Comments General comments (skin integrity, edema, etc.):  (Reviewed POC, discussed transitioning to Rehab on Monday, Pt appears motivated to improve functionally) ?  ?  ? ?Pertinent Vitals/Pain Pain Assessment ?Pain Assessment: 0-10 ?Pain Score: 3  ?Pain Location: LBP ?Pain Descriptors / Indicators: Discomfort ?Pain Intervention(s): Premedicated before session  ? ? ?Home Living   ?  ?  ?  ?  ?  ?  ?  ?  ?  ?   ?  ?Prior Function    ?  ?  ?   ? ?PT Goals (current goals can now be found in the care plan section) Acute Rehab PT Goals ?Patient Stated Goal: return home ? ?  ?Frequency ? ? ? 7X/week ? ? ? ?  ?PT Plan Current plan remains appropriate  ? ? ?Co-evaluation   ?  ?  ?  ?  ? ?  ?AM-PAC PT "6 Clicks" Mobility   ?Outcome Measure ? Help needed turning from your back to your side while in a flat bed without using bedrails?: A Lot ?Help needed moving from lying on your back to sitting on the side of a flat bed without using bedrails?: A Lot ?Help needed moving to and from a bed to a chair (including a wheelchair)?: Total ?Help needed standing up from a chair using your arms (e.g., wheelchair or bedside chair)?: Total ?Help needed to walk in hospital room?: Total ?Help needed climbing 3-5 steps with a railing? : Total ?6 Click Score: 8 ? ?  ?End of Session Equipment Utilized During Treatment: Gait belt ?Activity Tolerance: Patient tolerated treatment well ?Patient left: in bed;with call bell/phone within reach;with bed alarm set;with family/visitor present ?Nurse Communication: Mobility status (Did not transfer to chair due to concerns for increased difficulty transferring back to bed and pt tends to wiggle out of position easily.) ?PT Visit Diagnosis: Unsteadiness on feet (R26.81);Other abnormalities of gait and mobility (R26.89);Difficulty in walking, not elsewhere  classified (R26.2) ?  ? ? ?Time: 1100-1133 ?PT Time Calculation (min) (ACUTE ONLY): 33 min ? ?Charges:  $Therapeutic Activity: 23-37 mins          ?          ?Mikel Cella, PTA ? ? ?Josie Dixon ?07/27/2021, 12:37 PM ? ?

## 2021-07-27 NOTE — Progress Notes (Signed)
ANTICOAGULATION CONSULT NOTE - Initial Consult ? ?Pharmacy Consult for Heparin drip ?Indication: atrial fibrillation ? ?Allergies  ?Allergen Reactions  ? Atorvastatin   ?  Muscle/joint aches  ? Lantus [Insulin Glargine] Hives  ? Lyrica [Pregabalin] Other (See Comments)  ?  Headache, disorientation  ? Lisinopril Hives and Cough  ?   ?  ? ? ?Patient Measurements: ?Height: 5\' 4"  (162.6 cm) ?Weight: 64.4 kg (141 lb 15.6 oz) ?IBW/kg (Calculated) : 54.7 ?Heparin Dosing Weight: 62.1kg  ? ?Vital Signs: ?Temp: 98.2 ?F (36.8 ?C) (03/05 3748) ?Temp Source: Oral (03/04 2108) ?BP: 119/55 (03/05 0317) ?Pulse Rate: 86 (03/05 0317) ? ?Labs: ?Recent Labs  ?  07/24/21 ?0500 07/25/21 ?0747 07/25/21 ?1221 07/25/21 ?1224 07/26/21 ?2707 07/26/21 ?1608 07/27/21 ?0056  ?HGB  --  8.8*  --   --  8.6*  --   --   ?HCT  --  27.5*  --   --  26.1*  --   --   ?PLT  --  86*  --   --  134*  --   --   ?APTT  --   --   --    < > 47* 56* >200*  ?HEPARINUNFRC  --   --  0.39  --  0.33  --   --   ?CREATININE 1.56*  --   --   --  1.32*  --   --   ? < > = values in this interval not displayed.  ? ? ? ?Estimated Creatinine Clearance: 28.9 mL/min (A) (by C-G formula based on SCr of 1.32 mg/dL (H)). ? ? ?Medical History: ?Past Medical History:  ?Diagnosis Date  ? A-fib ([place])   ? a.) CHA2DS2-VASc Score = 6 (age x 2, sex, HTN, aortic plaque, T2DM). b.) rate/rhythm maintained on oral amiodarone + metoprolol succinate; chronically anticoagulated with full dose apixaban  ? Anemia   ? 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 Aortic atherosclerosis ([place])   ? Arthritis   ? Atrial flutter with rapid ventricular response ([place]) 02/24/2021  ? a.) in the setting of (+) SARS-CoV-2 infection; converted to NSR with increased dose of oral amiodarone.  ? Chronic cough   ? CKD (chronic kidney disease), stage III ([place])   ? Complication of anesthesia   ? COPD (chronic obstructive pulmonary disease) ([place])   ? Depression   ? Diastolic dysfunction   ? a.) TTE 04/08/2014: EF 60%;  mild concentric LVH; G2DD. b.) TTE 12/21/2019: EF 60-65%, LA mildly dilated, mild-mod MR; PASP 36.8; G1DD. c.) TTE 02/25/2021: EF 60-65%; normal LV function with mild concentric LVH; G1DD  ? Diverticulitis   ? Dyspnea   ? High cholesterol   ? History of 2019 novel coronavirus disease (COVID-19) 02/24/2021  ? History of hiatal hernia   ? History of kidney stones   ? Hypertension   ? Insomnia   ? Long term current use of anticoagulant   ? a.) apixaban  ? Lumbar spinal stenosis   ? Mild asthma   ? Murmur   ? Non-small cell carcinoma of left lung, stage 1 ([place]) 09/30/2020  ? a.) clinical stage 1 (cT1cN0cM0). b.) treated with SBRT (60 cGy over 5 fractions).  ? OSA on CPAP   ? Osteoporosis   ? Restless leg   ? Sepsis ([place])   ? T2DM (type 2 diabetes mellitus) ([place])   ? ? ?Medications:  ?Scheduled:  ?  stroke: mapping our early stages of recovery book   Does not apply Once  ?  amiodarone  200 mg Oral Daily  ? vitamin C  500 mg Oral BID  ? calcium-vitamin D  1 tablet Oral Daily  ? chlorhexidine  5 mL Mouth/Throat BID  ? Chlorhexidine Gluconate Cloth  6 each Topical Daily  ? feeding supplement (GLUCERNA SHAKE)  237 mL Oral TID BM  ? ferrous gluconate  324 mg Oral q AM  ? gabapentin  300 mg Oral QHS  ? insulin aspart  0-9 Units Subcutaneous TID WC  ? insulin glargine-yfgn  15 Units Subcutaneous Daily  ? multivitamin with minerals  1 tablet Oral Daily  ? polyethylene glycol  17 g Oral Daily  ? pramipexole  1 mg Oral QHS  ? rosuvastatin  10 mg Oral QHS  ? sertraline  50 mg Oral QHS  ? zinc sulfate  220 mg Oral Daily  ? ?Infusions:  ? heparin    ? ? ?Assessment: ?81 yo F starting Heparin drip for Afib. Patient on Apixaban PTA-currently holding-last dose given 2/28 at 0825 ? ?Baseline Labs: ?Hgb 8.8  plt 86  HL 0.39 aPTT 39 ? ? ? ?3/3:  aPTT @ 2052 = 101, therapeutic  X 1  ?3/4:  aPTT @ 0648 = 47, subtherapeutic(HL: 0.33) ?3/4 aPTT @ 1600 = 56, subtherapeutic ? ?Goal of Therapy:  ?Heparin level 0.3-0.7 units/ml ?aPTT 66-102  seconds ?Monitor platelets by anticoagulation protocol: Yes ?  ?Plan: ?3/5:  aPTT @ 0056 = > 200  ?- spoke with lab tech who said they analyzed this sample twice and came back with same result.   Also, phlebotomist stated that she drew the sample from opposite arm as heparin infusion.  Will accept this reading as valid. ? ?-  Will hold heparin drip for 1 hr and then restart at 0600 @ 1000 units/hr. ?-  Will recheck aPTT 8 hrs after restart.  ? ?Carissa Musick D ?07/27/2021,4:51 AM ? ? ?

## 2021-07-27 NOTE — Progress Notes (Signed)
PROGRESS NOTE  Natasha [name]    DOB: [birth year], 81 y.o.  LEX:[id number]    Code Status: Full Code   DOA: 07/17/2021   LOS: 9   Brief hospital course  Natasha [name] is a 81 y.o. female with a PMH significant for A-fib on Eliquis, HTN, HLD, type II DM, COPD, asthma, GERD, depression, former smoker, RLS, OSA, CKD 4, CHF, H/o lung cancer s/p radiation therapy, IDA, chronic pain. They presented from home to the ED on 07/17/2021 with dizziness upon waking up on 2/23 and was her normal self when went to bed that previous night. She had worsening of the symptoms and included left leg weakness.  Of note, pt is typically on chronic anticoagulation via Eliquis, however, this medication was held for scheduled right-sided carpal tunnel release, which occurred on 07/15/2021. In the ED, it was found that they had 2 small right cerebellar infarcts on head CT.  This was followed by brain MRI which showed numerous small acute infarcts scattered in the brain and compatible with central embolic disease.  Additionally, this imaging showed multiple bone lesions that were not previously seen on recent head imaging in 02/2021 and are consistent with metastatic disease. Neurology was consulted for further stroke evaluation and treatment.  They were treated with continued holding of Eliquis, permissive hypertension, PT/OT consult.  2/27- lymph node biopsy 2/28- patient had recurrent stroke symptoms and new lesions seen on repeat head CT.  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 3/3- repeat head CT shows expected progression of CVA lesions without new or hemorrhagic conversion. Started on IV heparin gtt without bolus  07/27/21 -stable  Assessment & Plan  Principal Problem:   Acute embolic stroke [facility]) Active Problems:   Essential hypertension   Iron deficiency anemia   OSA (obstructive sleep apnea)   Paroxysmal atrial fibrillation (HCC)   Restless leg  syndrome   CKD (chronic kidney disease) stage 4, GFR 15-29 ml/min (HCC)   HLD (hyperlipidemia)   Type II diabetes mellitus with renal manifestations (HCC)   COPD (chronic obstructive pulmonary disease) (HCC)   Chronic diastolic CHF (congestive heart failure) (HCC)   Leukocytosis   Bone lesion   Lung cancer metastatic to brain [facility])   Carpal tunnel syndrome on right   Lesion of liver   CVA (cerebral vascular accident) ([place])   Anemia   Thrombocytopenia ([place])   Pressure injury of skin   Posterior circulation stroke ([place])   Brain metastases ([place])  CVA-present on admission. recurrence of new lesions and symptoms 2/28.  Pattern is significant for embolic disease in setting of held anticoagulations for recent surgery. Patient denies any new symptoms since 2/28. Repeat head CT 3/3 to monitor progression of the lesions was negative for hemorrhage or mass effect or new lesions. Denies any new symptoms. Tolerating well. - initiated on heparin gtt - restart eliquis half dose Monday  - age >34, serum Cr >1.5. her weight is also borderline at 63kg -Neurology following, appreciate recommendations -PT/OT/SLP -Stat head CT for any new emerging symptoms - monitor for new symptoms and/or bleeding over weekend and will dc to SNF Monday if remains stable  New incidental finding of bone lesions seen on head/C-spine imaging suspicious of metastatic disease- history of lung cancer s/p radiation therapy.  S/p lymph node biopsy 2/27 consistent with metastatic adenocarcinoma from lung primary - heme/onc following, appreciate recommendations  - f/u outpatient  HTN-permissive hypertension, patient's Bps have had low diastolics so no antihypertensives added and  discontinued opioid pain medications. -Titrate back on home blood pressure medications as able or indicated  PAF-Eliquis was restarted on 2/28 after being held for her recent carpal tunnel release surgery. Held when new stroke found and then restarted  again 3/6. Rate controlled.  -Continue amiodarone - continue eliquis at 2.$RemoveBefo'5mg'PxhezZWaiaq$  BID when able  AKI (resolved) on CKD 4-gradual improvement Cr 2.2>2.08>1.56>1.32. appears to be close to baseline. -monitor  Type II DM   HLD- moderately well controlled. Hgb A1c goal <8 -Continue statin -Continue sliding scale -Continue Semglee 15 units daily  COPD-chronic, stable. ORA -As needed bronchodilators  HFpEF-holding diuretics  Restless leg syndrome -Continue home medication  Acute urinary retention- failed void trial and was getting frequent I/Os. Discharge with foley - f/u urology OP  OSA -Continue CPAP nightly  IDA   thrombocytopenia- platelets decreased and likely reactionary. Have been stable and improving while on heparin gtt  131>>>>70>86>134>210 today - CBC am -Continue iron supplement  Carpal tunnel syndrome on right s/p release on 2/21. Ortho evaluated and removed sutures 3/2. Endorses feeling improvement - PT/OT  Depression- chronic, stable -Continue home sertraline  Body mass index is 24.37 kg/m.  VTE ppx: Place and maintain sequential compression device Start: 07/18/21 0658 SCDs Start: 07/17/21 0525   Diet:     Diet   Diet Carb Modified Fluid consistency: Thin; Room service appropriate? Yes with Assist   Subjective 07/27/21    Pt reports feeling well today. She is looking forward to having non-hospital food. No other complaints at this time. Her and husband have several questions about her transition to rehab tomorrow which were all addressed in our encounter today.   Objective   Vitals:   07/26/21 1627 07/26/21 2108 07/27/21 0300 07/27/21 0317  BP: (!) 121/52 (!) 117/53  (!) 119/55  Pulse: 86 83  86  Resp: $Remo'18 16  19  'udrTW$ Temp: 98.6 F (37 C) 98.4 F (36.9 C)  98.2 F (36.8 C)  TempSrc:  Oral    SpO2: 97% 96%  96%  Weight:   64.4 kg   Height:        Intake/Output Summary (Last 24 hours) at 07/27/2021 0717 Last data filed at 07/27/2021 7829 Gross per 24  hour  Intake 286.15 ml  Output 1550 ml  Net -1263.85 ml    Filed Weights   07/23/21 0519 07/26/21 0500 07/27/21 0300  Weight: 63.1 kg 64.4 kg 64.4 kg     Physical Exam:  General: awake, alert, NAD HEENT: atraumatic, clear conjunctiva, anicteric sclera, MMM, hearing grossly normal Respiratory: normal respiratory effort. Cardiovascular: quick capillary refill  Nervous: A&O x3. Moving all extremities, normal speech Extremities: moves all equally, no edema, normal tone Skin: dry, intact, normal temperature, normal color. No rashes, lesions or ulcers on exposed skin Psychiatry: normal mood, congruent affect  Labs   I have personally reviewed the following labs and imaging studies CBC    Component Value Date/Time   WBC 15.3 (H) 07/27/2021 0549   RBC 3.18 (L) 07/27/2021 0549   HGB 8.9 (L) 07/27/2021 0549   HCT 27.4 (L) 07/27/2021 0549   PLT 210 07/27/2021 0549   MCV 86.2 07/27/2021 0549   MCH 28.0 07/27/2021 0549   MCHC 32.5 07/27/2021 0549   RDW 17.5 (H) 07/27/2021 0549   LYMPHSABS 1.3 06/16/2021 1039   MONOABS 0.6 06/16/2021 1039   EOSABS 0.1 06/16/2021 1039   BASOSABS 0.0 06/16/2021 1039   BMP Latest Ref Rng & Units 07/26/2021 07/24/2021 07/23/2021  Glucose 70 -  99 mg/dL 194(H) 224(H) 224(H)  BUN 8 - 23 mg/dL 38(H) 60(H) 70(H)  Creatinine 0.44 - 1.00 mg/dL 1.32(H) 1.56(H) 2.08(H)  BUN/Creat Ratio 6 - 22 (calc) - - -  Sodium 135 - 145 mmol/L 137 135 134(L)  Potassium 3.5 - 5.1 mmol/L 3.8 3.9 4.1  Chloride 98 - 111 mmol/L 109 107 104  CO2 22 - 32 mmol/L 20(L) 19(L) 19(L)  Calcium 8.9 - 10.3 mg/dL 8.1(L) 8.2(L) 8.0(L)   CT HEAD WO CONTRAST (5MM)  Result Date: 07/25/2021 CLINICAL DATA:  81 year old female with recent neurologic deficit and embolic appearing infarcts scattered throughout the brain on 07/22/2021. EXAM: CT HEAD WITHOUT CONTRAST TECHNIQUE: Contiguous axial images were obtained from the base of the skull through the vertex without intravenous contrast. RADIATION DOSE  REDUCTION: This exam was performed according to the departmental dose-optimization program which includes automated exposure control, adjustment of the mA and/or kV according to patient size and/or use of iterative reconstruction technique. COMPARISON:  Brain MRI 07/22/2021.  Head CT 07/17/2021. FINDINGS: Brain: Development of cytotoxic edema corresponding to the DWI positive infarcts on the recent MRI, most conspicuous in the left occipital pole (series 2, image 12), right centrum semiovale (image 18), and cerebellum (image 9). No associated hemorrhage. No mass effect. Stable gray-white matter differentiation otherwise. No ventriculomegaly. Normal basilar cisterns. Vascular: Calcified atherosclerosis at the skull base. No suspicious intracranial vascular hyperdensity. Skull: Osteopenia.  No acute osseous abnormality identified. Sinuses/Orbits: Trace fluid layering in the left sphenoid sinus. Other Visualized paranasal sinuses and mastoids are clear. Other: No acute orbit or scalp soft tissue finding. IMPRESSION: 1. Expected CT appearance of the scattered bilateral infarcts on 07/22/2021 MRI. No associated hemorrhage or mass effect. 2. No new intracranial abnormality. Electronically Signed   By: Genevie Ann M.D.   On: 07/25/2021 08:56     Disposition Plan & Communication  Patient status: Inpatient  Admitted From: Home Planned disposition location: Skilled nursing facility Anticipated discharge date: 3/6 pending no new stroke symptoms and tolerating anticoagulation, neuro clearance, and not needing oncology inpatient workup  Family Communication: husband at bedside   Author: Richarda Osmond, DO [facility] 07/27/2021, 7:17 AM   Available by Epic secure chat 7AM-7PM. If 7PM-7AM, please contact night-coverage.  [facility] contact information found on [email or web address].

## 2021-07-28 DIAGNOSIS — R338 Other retention of urine: Secondary | ICD-10-CM | POA: Diagnosis not present

## 2021-07-28 DIAGNOSIS — I480 Paroxysmal atrial fibrillation: Secondary | ICD-10-CM | POA: Diagnosis not present

## 2021-07-28 DIAGNOSIS — C7951 Secondary malignant neoplasm of bone: Secondary | ICD-10-CM

## 2021-07-28 DIAGNOSIS — I639 Cerebral infarction, unspecified: Secondary | ICD-10-CM | POA: Diagnosis not present

## 2021-07-28 DIAGNOSIS — I6350 Cerebral infarction due to unspecified occlusion or stenosis of unspecified cerebral artery: Secondary | ICD-10-CM | POA: Diagnosis not present

## 2021-07-28 DIAGNOSIS — M899 Disorder of bone, unspecified: Secondary | ICD-10-CM | POA: Diagnosis not present

## 2021-07-28 LAB — BASIC METABOLIC PANEL
Anion gap: 4 — ABNORMAL LOW (ref 5–15)
BUN: 24 mg/dL — ABNORMAL HIGH (ref 8–23)
CO2: 22 mmol/L (ref 22–32)
Calcium: 8.2 mg/dL — ABNORMAL LOW (ref 8.9–10.3)
Chloride: 108 mmol/L (ref 98–111)
Creatinine, Ser: 1.12 mg/dL — ABNORMAL HIGH (ref 0.44–1.00)
GFR, Estimated: 49 mL/min — ABNORMAL LOW (ref >60)
Glucose, Bld: 160 mg/dL — ABNORMAL HIGH (ref 70–99)
Potassium: 4.1 mmol/L (ref 3.5–5.1)
Sodium: 134 mmol/L — ABNORMAL LOW (ref 135–145)

## 2021-07-28 LAB — GLUCOSE, CAPILLARY
Glucose-Capillary: 161 mg/dL — ABNORMAL HIGH (ref 70–99)
Glucose-Capillary: 143 mg/dL — ABNORMAL HIGH (ref 70–99)
Glucose-Capillary: 161 mg/dL — ABNORMAL HIGH (ref 70–99)
Glucose-Capillary: 128 mg/dL — ABNORMAL HIGH (ref 70–99)

## 2021-07-28 LAB — CBC
HCT: 26.3 % — ABNORMAL LOW (ref 36.0–46.0)
Hemoglobin: 8.5 g/dL — ABNORMAL LOW (ref 12.0–15.0)
MCH: 27.9 pg (ref 26.0–34.0)
MCHC: 32.3 g/dL (ref 30.0–36.0)
MCV: 86.2 fL (ref 80.0–100.0)
Platelets: 244 10*3/uL (ref 150–400)
RBC: 3.05 MIL/uL — ABNORMAL LOW (ref 3.87–5.11)
RDW: 17.7 % — ABNORMAL HIGH (ref 11.5–15.5)
WBC: 14.9 10*3/uL — ABNORMAL HIGH (ref 4.0–10.5)
nRBC: 0.1 % (ref 0.0–0.2)

## 2021-07-28 LAB — HEPARIN LEVEL (UNFRACTIONATED): Heparin Unfractionated: 0.39 [IU]/mL (ref 0.30–0.70)

## 2021-07-28 LAB — APTT: aPTT: 106 seconds — ABNORMAL HIGH (ref 24–36)

## 2021-07-28 MED ORDER — ROSUVASTATIN CALCIUM 10 MG PO TABS: 10.0000 mg | ORAL_TABLET | Freq: Every day | ORAL | Status: AC

## 2021-07-28 MED ORDER — APIXABAN 5 MG PO TABS
5.0000 mg | ORAL_TABLET | Freq: Two times a day (BID) | ORAL | Status: DC
Start: 2021-07-28 — End: 2021-07-31
  Administered 2021-07-28 – 2021-07-31 (×7): 5 mg via ORAL
  Filled 2021-07-28 (×7): qty 1

## 2021-07-28 MED ORDER — POLYETHYLENE GLYCOL 3350 17 G PO PACK: 17.0000 g | PACK | Freq: Every day | ORAL | 0 refills | Status: AC

## 2021-07-28 MED ORDER — TRESIBA FLEXTOUCH 100 UNIT/ML ~~LOC~~ SOPN: 15.0000 [IU] | PEN_INJECTOR | Freq: Every day | SUBCUTANEOUS | Status: AC

## 2021-07-28 MED ORDER — LORAZEPAM 0.5 MG PO TABS
0.5000 mg | ORAL_TABLET | Freq: Two times a day (BID) | ORAL | Status: DC | PRN
  Administered 2021-07-28: 0.5 mg via ORAL
  Filled 2021-07-28 (×2): qty 1

## 2021-07-28 MED ORDER — APIXABAN 5 MG PO TABS: 5.0000 mg | ORAL_TABLET | Freq: Two times a day (BID) | ORAL | Status: DC

## 2021-07-28 MED ORDER — ACETAMINOPHEN 325 MG PO TABS: 650.0000 mg | ORAL_TABLET | Freq: Four times a day (QID) | ORAL | Status: AC | PRN

## 2021-07-28 MED ORDER — GABAPENTIN 100 MG PO CAPS: 100.0000 mg | ORAL_CAPSULE | Freq: Three times a day (TID) | ORAL | Status: DC | PRN

## 2021-07-28 MED ORDER — TRAMADOL HCL 50 MG PO TABS
50.0000 mg | ORAL_TABLET | Freq: Two times a day (BID) | ORAL | Status: DC | PRN
  Administered 2021-07-28 – 2021-07-29 (×2): 50 mg via ORAL
  Filled 2021-07-28 (×2): qty 1

## 2021-07-28 NOTE — Progress Notes (Signed)
Occupational Therapy Treatment ?Patient Details ?Name: Natasha [name] ?MRN: [id number] ?DOB: 06-24-1940 ?Today's Date: 07/28/2021 ? ? ?History of present illness 81 y.o. female with history of atrial fibrillation on Eliquis, CHF, hypertension, hyperlipidemia, diabetes, COPD, chronic kidney disease, lung CA, depression, and recent R carpal tunnel release (Jul 15, 2021), who presents to the emergency department with complaints of vertigo. 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 ?  ?OT comments ? Natasha [name] was seen for OT treatment on this date. Upon arrival to room pt reclined in bed with PT at bedside, overlapping for safe mobility. MIN A tooth brushing sitting EOB - intermittent assist for posterior lean with activity. MIN A don/doff gown at bed level - assist for LUE. MAX A pericare and rolling at bed level. Extensive education to family on POC. Pt making good progress toward goals. Pt continues to benefit from skilled OT services to maximize return to PLOF and minimize risk of future falls, injury, caregiver burden, and readmission. Will continue to follow POC, frequency downgraded, family updated. Discharge recommendation remains appropriate.  ?  ? ?Recommendations for follow up therapy are one component of a multi-disciplinary discharge planning process, led by the attending physician.  Recommendations may be updated based on patient status, additional functional criteria and insurance authorization. ?   ?Follow Up Recommendations ? Skilled nursing-short term rehab (<3 hours/day)  ?  ?Assistance Recommended at Discharge Frequent or constant Supervision/Assistance  ?Patient can return home with the following ? Two people to help with walking and/or transfers;Two people to help with bathing/dressing/bathroom;Help with stairs or ramp for entrance ?  ?Equipment  Recommendations ? Hospital bed  ?  ?Recommendations for Other Services   ? ?  ?Precautions / Restrictions Precautions ?Precautions: Fall ?Precaution Comments: recent carpal tunnel sx R UE ?Restrictions ?Weight Bearing Restrictions: No  ? ? ?  ? ?Mobility Bed Mobility ?Overal bed mobility: Needs Assistance ?Bed Mobility: Supine to Sit, Sit to Supine ?Rolling: Max assist ?  ?Supine to sit: Mod assist ?Sit to supine: Max assist ?  ?  ?  ? ?Transfers ?Overall transfer level: Needs assistance ?Equipment used: Rolling walker (2 wheels) ?Transfers: Sit to/from Stand, Bed to chair/wheelchair/BSC ?Sit to Stand: Max assist, +2 physical assistance, From elevated surface ?  ?  ?  ?  ? Lateral/Scoot Transfers: Max assist ?  ?  ?  ?Balance Overall balance assessment: Needs assistance ?Sitting-balance support: Feet supported, Bilateral upper extremity supported ?Sitting balance-Leahy Scale: Fair ?  ?  ?Standing balance support: Bilateral upper extremity supported, Reliant on assistive device for balance ?Standing balance-Leahy Scale: Poor ?  ?  ?  ?  ?  ?  ?  ?  ?  ?  ?  ?  ?   ? ?ADL either performed or assessed with clinical judgement  ? ?ADL Overall ADL's : Needs assistance/impaired ?  ?  ?  ?  ?  ?  ?  ?  ?  ?  ?  ?  ?  ?  ?  ?  ?  ?  ?  ?General ADL Comments: MIN A tooth brushing sitting EOB - intermittent assist for posterior lean with activity. MIN A don/doff gown at bed level - assist for LUE. MAX A pericare and rolling at bed level ?  ? ? ? ?Cognition Arousal/Alertness: Awake/alert ?Behavior During Therapy: [facility] for tasks assessed/performed ?Overall Cognitive Status:  Within Functional Limits for tasks assessed ?  ?  ?  ?  ?  ?  ?  ?  ?  ?  ?  ?  ?  ?  ?  ?  ?General Comments: follows commands with increased time ?  ?  ?   ?Exercises   ? ?  ?Shoulder Instructions   ? ? ?  ?General Comments    ? ? ?Pertinent Vitals/ Pain       Pain Assessment ?Pain Assessment: Faces ?Faces Pain Scale: Hurts even more ?Pain Location: sacral  wound and shoulders ?Pain Descriptors / Indicators: Discomfort, Grimacing ?Pain Intervention(s): Limited activity within patient's tolerance, Repositioned ? ?Home Living   ?  ?  ?  ?  ?  ?  ?  ?  ?  ?  ?  ?  ?  ?  ?  ?  ?  ?  ? ?  ?Prior Functioning/Environment    ?  ?  ?  ?   ? ?Frequency ? Min 2X/week  ? ? ? ? ?  ?Progress Toward Goals ? ?OT Goals(current goals can now be found in the care plan section) ? Progress towards OT goals: Progressing toward goals ? ?Acute Rehab OT Goals ?Patient Stated Goal: to walk ?OT Goal Formulation: With patient/family ?Time For Goal Achievement: 08/06/21 ?Potential to Achieve Goals: Good ?ADL Goals ?Pt Will Perform Grooming: standing;with min assist ?Pt Will Perform Lower Body Dressing: with min assist;sit to/from stand ?Pt Will Transfer to Toilet: ambulating;bedside commode;with min assist  ?Plan Discharge plan remains appropriate;Frequency needs to be updated   ? ?Co-evaluation ? ? ?   ?Reason for Co-Treatment: Complexity of the patient's impairments (multi-system involvement);For patient/therapist safety;To address functional/ADL transfers ?PT goals addressed during session: Mobility/safety with mobility;Balance;Proper use of DME;Strengthening/ROM ?OT goals addressed during session: ADL's and self-care;Strengthening/ROM ?  ? ?  ?AM-PAC OT "6 Clicks" Daily Activity     ?Outcome Measure ? ? Help from another person eating meals?: A Little ?Help from another person taking care of personal grooming?: A Little ?Help from another person toileting, which includes using toliet, bedpan, or urinal?: A Lot ?Help from another person bathing (including washing, rinsing, drying)?: A Lot ?Help from another person to put on and taking off regular upper body clothing?: A Little ?Help from another person to put on and taking off regular lower body clothing?: A Lot ?6 Click Score: 15 ? ?  ?End of Session Equipment Utilized During Treatment: Rolling walker (2 wheels) ? ?OT Visit Diagnosis: Other  abnormalities of gait and mobility (R26.89);Muscle weakness (generalized) (M62.81);Hemiplegia and hemiparesis ?Hemiplegia - Right/Left: Left ?Hemiplegia - dominant/non-dominant: Non-Dominant ?Hemiplegia - caused by: Cerebral infarction ?  ?Activity Tolerance Patient tolerated treatment well ?  ?Patient Left in bed;with call bell/phone within reach;with family/visitor present ?  ?Nurse Communication   ?  ? ?   ? ?Time: 5638-9373 ?OT Time Calculation (min): 44 min ? ?Charges: OT General Charges ?$OT Visit: 1 Visit ?OT Treatments ?$Self Care/Home Management : 23-37 mins ? ?Dessie Coma, M.S. OTR/L  ?07/28/21, 4:03 PM  ?ascom [phone] ? ?

## 2021-07-28 NOTE — Progress Notes (Signed)
?   07/28/21 0800  ?Clinical Encounter Type  ?Visited With Patient and family together  ?Visit Type Follow-up;Spiritual support  ?Spiritual Encounters  ?Spiritual Needs Prayer  ? ?Chaplain followed up on previous visit to encourage and pray for family in this journey. ?

## 2021-07-28 NOTE — Discharge Summary (Signed)
Physician Discharge Summary  Patient: Natasha [name] RQK:[id number] DOB: May 16, 1941   Code Status: Full Code Admit date: 07/17/2021 Discharge date: 07/28/2021 Disposition: Skilled nursing facility,  PCP: Lynnda Child, MD  Recommendations for Outpatient Follow-up:  Follow up with PCP within 1-2 weeks Monitor blood pressure. Held home blood pressure medications while inpatient due to soft Bps and to allow for permissive HTN following stroke. Recommend restarting as needed for BP at goal for age Follow up with neurology Restarted epiquis on day of discharge Follow up with ortho Monitor carpel tunnel release surgery recovery Follow up with heme/onc Discuss treatment options in regard to findings while inpatient Head CT consistent with metastatic dz, lymph node biopsy consistent with adenocarcinoma lung primary Follow up with urology- patient had acute urinary retention in setting of acute stroke and failed voiding trial. She will be discharged with a foley in place.  Discharge Diagnoses:  Principal Problem:   Acute embolic stroke [facility]) Active Problems:   Essential hypertension   Iron deficiency anemia   OSA (obstructive sleep apnea)   Paroxysmal atrial fibrillation (HCC)   Restless leg syndrome   CKD (chronic kidney disease) stage 4, GFR 15-29 ml/min (HCC)   HLD (hyperlipidemia)   Type II diabetes mellitus with renal manifestations (HCC)   COPD (chronic obstructive pulmonary disease) (HCC)   Chronic diastolic CHF (congestive heart failure) (HCC)   Leukocytosis   Bone lesion   Lung cancer metastatic to brain [facility])   Carpal tunnel syndrome on right   Lesion of liver   CVA (cerebral vascular accident) (HCC)   Anemia   Thrombocytopenia (HCC)   Pressure injury of skin   Posterior circulation stroke (HCC)   Brain metastases [facility])  Brief Hospital Course Summary: Natasha [name] is a 81 y.o. female with a PMH significant for A-fib on Eliquis, HTN, HLD, type II DM, COPD, asthma,  GERD, depression, former smoker, RLS, OSA, CKD 4, CHF, H/o lung cancer s/p radiation therapy, IDA, chronic pain. They presented from home to the ED on 07/17/2021 with dizziness upon waking up on 2/23 and was her normal self when went to bed that previous night. She had worsening of the symptoms and included left leg weakness.  Of note, pt is typically on chronic anticoagulation via Eliquis, however, this medication was held for scheduled right-sided carpal tunnel release, which occurred on 07/15/2021. In the ED, it was found that they had 2 small right cerebellar infarcts on head CT. This was followed by brain MRI which showed numerous small acute infarcts scattered in the brain and compatible with central embolic disease.   Additionally, this stroke work up imaging showed multiple bone lesions that were not previously seen on recent head imaging in 02/2021 and are consistent with metastatic disease.  Neurology was consulted for further stroke evaluation and treatment. Heme/onc was consulted for further workup and management of new malignancy.   They were treated with continued holding of Eliquis, permissive hypertension, PT/OT consult. Her symptoms showed improvement and she appeared to be stable.   2/27- cervical lymph node biopsy to diagnose primary malignancy   2/28- patient had recurrent stroke symptoms and repeat head CT showed new acute lesions. Again, patient showed good recovery from her symptoms. PT/OT worked with her and recommended SNF at dc to help with recovery.    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 Heme onc recommended outpatient f/u.  3/2- ortho removed sutures from carpel tunnel release   3/3- repeat head  CT shows expected progression of CVA lesions without new or hemorrhagic conversion. Started on IV heparin gtt without bolus  3/6- heparin gtt discontinued and patient restarted on home eliquis prior to dc  Discharge  Condition: Good, improved Recommended discharge diet: Regular healthy diet  Consultations: Neurology Heme/onc  Radiology  Ortho   Procedures/Studies: Lymph node biopsy Brain MRI Foley in place for acute urinary retention  Discharge Instructions     Ambulatory referral to Urology   Complete by: As directed       Allergies as of 07/28/2021       Reactions   Atorvastatin    Muscle/joint aches   Lantus [insulin Glargine] Hives   Lyrica [pregabalin] Other (See Comments)   Headache, disorientation   Lisinopril Hives, Cough           Medication List     STOP taking these medications    amoxicillin 500 MG capsule Commonly known as: AMOXIL   Breztri Aerosphere 160-9-4.8 MCG/ACT Aero Generic drug: Budeson-Glycopyrrol-Formoterol   chlorhexidine 0.12 % solution Commonly known as: PERIDEX   HYDROcodone-acetaminophen 7.5-325 MG tablet Commonly known as: Norco   metoprolol succinate 25 MG 24 hr tablet Commonly known as: TOPROL-XL   pantoprazole 40 MG tablet Commonly known as: PROTONIX   spironolactone 25 MG tablet Commonly known as: ALDACTONE       TAKE these medications    acetaminophen 325 MG tablet Commonly known as: TYLENOL Take 2 tablets (650 mg total) by mouth every 6 (six) hours as needed for mild pain or fever.   amiodarone 200 MG tablet Commonly known as: PACERONE Take 400 mg by mouth daily.   apixaban 5 MG Tabs tablet Commonly known as: Eliquis Take 1 tablet (5 mg total) by mouth 2 (two) times daily.   CALCIUM 600+D3 PO Take 1 tablet by mouth in the morning.   Contour Next Test test strip Generic drug: glucose blood Check sugar twice daily DX E11.69   gabapentin 100 MG capsule Commonly known as: NEURONTIN Take 1 capsule (100 mg total) by mouth 3 (three) times daily as needed. Take one capsule by mouth nightly for one week. Then increase to 2 capsules nightly for one week. Then 3 capsules nightly. What changed:  how much to take when  to take this reasons to take this   IRON 27 PO Take 27 mg by mouth in the morning.   Janumet 50-1000 MG tablet Generic drug: sitaGLIPtin-metformin Take 2 tablets by mouth in the morning.   Jardiance 25 MG Tabs tablet Generic drug: empagliflozin Take 25 mg by mouth daily.   levalbuterol 45 MCG/ACT inhaler Commonly known as: XOPENEX HFA Inhale 2 puffs into the lungs every 8 (eight) hours as needed for wheezing or shortness of breath (cough).   polyethylene glycol 17 g packet Commonly known as: MIRALAX / GLYCOLAX Take 17 g by mouth daily. Start taking on: July 29, 2021   pramipexole 1 MG tablet Commonly known as: MIRAPEX Take 1 tablet (1 mg total) by mouth at bedtime. Appt with PCP needed for further refills.   rosuvastatin 10 MG tablet Commonly known as: CRESTOR Take 1 tablet (10 mg total) by mouth at bedtime. What changed:  medication strength how much to take   sertraline 50 MG tablet Commonly known as: ZOLOFT Take 1 tablet (50 mg total) by mouth at bedtime.   Natasha [name] FlexTouch 100 UNIT/ML FlexTouch Pen Generic drug: insulin degludec Inject 15 Units into the skin daily. What changed: when to take this  Vitamin D (Ergocalciferol) 1.25 MG (50000 UNIT) Caps capsule Commonly known as: DRISDOL Take 1 capsule (50,000 Units total) by mouth every 7 (seven) days.        Subjective   Pt reports doing well overall. She is disappointed that she is not going straight home. No specific complaints today. Husband at bedside has questions about discharge which were addressed at time of encounter. Objective  Blood pressure (!) 148/56, pulse 88, temperature 98.2 F (36.8 C), temperature source Oral, resp. rate 18, height $RemoveBe'5\' 4"'mbOFqTWCn$  (1.626 m), weight 68 kg, SpO2 96 %.   General: Pt is alert, awake, not in acute distress Cardiovascular: RRR, S1/S2 +, no rubs, no gallops Respiratory: CTA bilaterally, no wheezing, no rhonchi Abdominal: Soft, NT, ND, bowel sounds + Extremities: no  edema, no cyanosis   The results of significant diagnostics from this hospitalization (including imaging, microbiology, ancillary and laboratory) are listed below for reference.   Imaging studies: CT HEAD WO CONTRAST (5MM)  Result Date: 07/25/2021 CLINICAL DATA:  81 year old female with recent neurologic deficit and embolic appearing infarcts scattered throughout the brain on 07/22/2021. EXAM: CT HEAD WITHOUT CONTRAST TECHNIQUE: Contiguous axial images were obtained from the base of the skull through the vertex without intravenous contrast. RADIATION DOSE REDUCTION: This exam was performed according to the departmental dose-optimization program which includes automated exposure control, adjustment of the mA and/or kV according to patient size and/or use of iterative reconstruction technique. COMPARISON:  Brain MRI 07/22/2021.  Head CT 07/17/2021. FINDINGS: Brain: Development of cytotoxic edema corresponding to the DWI positive infarcts on the recent MRI, most conspicuous in the left occipital pole (series 2, image 12), right centrum semiovale (image 18), and cerebellum (image 9). No associated hemorrhage. No mass effect. Stable gray-white matter differentiation otherwise. No ventriculomegaly. Normal basilar cisterns. Vascular: Calcified atherosclerosis at the skull base. No suspicious intracranial vascular hyperdensity. Skull: Osteopenia.  No acute osseous abnormality identified. Sinuses/Orbits: Trace fluid layering in the left sphenoid sinus. Other Visualized paranasal sinuses and mastoids are clear. Other: No acute orbit or scalp soft tissue finding. IMPRESSION: 1. Expected CT appearance of the scattered bilateral infarcts on 07/22/2021 MRI. No associated hemorrhage or mass effect. 2. No new intracranial abnormality. Electronically Signed   By: Genevie Ann M.D.   On: 07/25/2021 08:56   CT Head Wo Contrast  Result Date: 07/17/2021 CLINICAL DATA:  Nonspecific dizziness EXAM: CT HEAD WITHOUT CONTRAST  TECHNIQUE: Contiguous axial images were obtained from the base of the skull through the vertex without intravenous contrast. RADIATION DOSE REDUCTION: This exam was performed according to the departmental dose-optimization program which includes automated exposure control, adjustment of the mA and/or kV according to patient size and/or use of iterative reconstruction technique. COMPARISON:  03/09/2021 FINDINGS: Brain: 2 small right cerebellar infarcts not seen on prior, possibly recent and symptomatic. Few remote supratentorial white matter insults. No hemorrhage, hydrocephalus, or collection. Age normal brain volume. Vascular: No hyperdense vessel or unexpected calcification. Skull: Normal. Negative for fracture or focal lesion. Sinuses/Orbits: No acute finding. 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 Electronically Signed   By: Jorje Guild M.D.   On: 07/17/2021 04:32   MR ANGIO HEAD WO CONTRAST  Result Date: 07/17/2021 CLINICAL DATA:  Dizziness, infarcts on MRI EXAM: MRA NECK WITHOUT CONTRAST MRA HEAD WITHOUT CONTRAST TECHNIQUE: Angiographic images of the Circle of Willis were acquired using MRA technique without intravenous contrast. COMPARISON:  None. FINDINGS: MRA NECK FINDINGS Standard aortic  branching. Common, internal, and external carotid arteries are patent, without hemodynamically significant stenosis. Extracranial vertebral arteries are patent, without hemodynamically significant stenosis, although imaging of the origins is somewhat limited by artifact. MRA HEAD FINDINGS Both internal carotid arteries are patent to the termini, without significant stenosis. A1 segments patent. Normal anterior communicating artery. Anterior cerebral arteries are patent to their distal aspects. No M1 stenosis or occlusion. Normal MCA bifurcations. Distal MCA branches perfused and symmetric. Vertebral arteries patent to the vertebrobasilar  junction without stenosis. Basilar patent to its distal aspect. Superior cerebellar arteries patent bilaterally. Patent P1 segments, diminutive on the right. Near fetal origin of the right PCA with patent right posterior communicating artery. PCAs perfused to their distal aspects without stenosis. Possible diminutive left posterior communicating artery. IMPRESSION: 1.  No intracranial large vessel occlusion or significant stenosis. 2.  No hemodynamically significant stenosis in the neck. Electronically Signed   By: Merilyn Baba M.D.   On: 07/17/2021 23:08   MR ANGIO NECK WO CONTRAST  Result Date: 07/17/2021 CLINICAL DATA:  Dizziness, infarcts on MRI EXAM: MRA NECK WITHOUT CONTRAST MRA HEAD WITHOUT CONTRAST TECHNIQUE: Angiographic images of the Circle of Willis were acquired using MRA technique without intravenous contrast. COMPARISON:  None. FINDINGS: MRA NECK FINDINGS Standard aortic branching. Common, internal, and external carotid arteries are patent, without hemodynamically significant stenosis. Extracranial vertebral arteries are patent, without hemodynamically significant stenosis, although imaging of the origins is somewhat limited by artifact. MRA HEAD FINDINGS Both internal carotid arteries are patent to the termini, without significant stenosis. A1 segments patent. Normal anterior communicating artery. Anterior cerebral arteries are patent to their distal aspects. No M1 stenosis or occlusion. Normal MCA bifurcations. Distal MCA branches perfused and symmetric. Vertebral arteries patent to the vertebrobasilar junction without stenosis. Basilar patent to its distal aspect. Superior cerebellar arteries patent bilaterally. Patent P1 segments, diminutive on the right. Near fetal origin of the right PCA with patent right posterior communicating artery. PCAs perfused to their distal aspects without stenosis. Possible diminutive left posterior communicating artery. IMPRESSION: 1.  No intracranial large vessel  occlusion or significant stenosis. 2.  No hemodynamically significant stenosis in the neck. Electronically Signed   By: Merilyn Baba M.D.   On: 07/17/2021 23:08   MR BRAIN WO CONTRAST  Result Date: 07/22/2021 CLINICAL DATA:  Acute neuro deficit. Stroke. Now with bilateral leg weakness and diplopia. EXAM: MRI HEAD WITHOUT CONTRAST TECHNIQUE: Multiplanar, multiecho pulse sequences of the brain and surrounding structures were obtained without intravenous contrast. COMPARISON:  MRI head 07/17/2021 FINDINGS: Brain: Numerous areas of acute infarct are present compatible with emboli. Multiple small infarcts in the cerebellum bilaterally are stable. Progressive acute infarct in the left occipital pole with mild associated petechial hemorrhage which was seen previously. Multiple small areas of acute infarct in the frontal and parietal lobes bilaterally and in the white matter. Progression of cluster of acute infarcts in the right parietal white matter and also in the right medial parietal cortex. Small acute infarct head of caudate on the left unchanged. Small acute infarct head of caudate on the right is new. Ventricle size normal.  No mass or midline shift. Vascular: Normal arterial flow voids. Skull and upper cervical spine: Bilateral calvarial lesions are again noted. These are suspicious for metastatic disease. Sinuses/Orbits: Paranasal sinuses clear. Bilateral cataract extraction Other: None IMPRESSION: Numerous areas of acute infarct in the cerebrum and cerebellum bilaterally compatible with acute embolic infarction. There has been progression of acute infarcts since the recent MRI of  07/17/2021. Findings suggest recurrent emboli. Progression of infarct left occipital pole. Petechial hemorrhage in this area unchanged from the prior study. No other hemorrhage. Lesions in the calvarium bilaterally, suspicious for metastatic disease. Electronically Signed   By: Franchot Gallo M.D.   On: 07/22/2021 14:59   MR BRAIN  WO CONTRAST  Result Date: 07/17/2021 CLINICAL DATA:  Nonspecific dizziness. EXAM: MRI HEAD WITHOUT CONTRAST TECHNIQUE: Multiplanar, multiecho pulse sequences of the brain and surrounding structures were obtained without intravenous contrast. COMPARISON:  Head CT from earlier today FINDINGS: Brain: Patchy acute infarcts in the bilateral cerebellum and bilateral frontal, parietal, and occipital convexities. Patchy acute infarct in the right more than left centrum semiovale and in the left caudate head. Mild petechial hemorrhage at the right occipital cortex. No hematoma, hydrocephalus, or collection. Vascular: Normal flow voids Skull and upper cervical spine: New scattered bone lesions in the C2 right articular process, right para median clivus tip, and in the bilateral calvarium, affected areas marked on sagittal T2 weighted imaging. Sinuses/Orbits: Negative IMPRESSION: 1. Numerous small acute infarcts scattered in the brain and compatible with central embolic disease. 2. 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 Recommend metastatic workup. Electronically Signed   By: Jorje Guild M.D.   On: 07/17/2021 05:42   ECHOCARDIOGRAM COMPLETE  Result Date: 07/17/2021    ECHOCARDIOGRAM REPORT   Patient Name:   Natasha [name] Date of Exam: 07/17/2021 Medical Rec #:  [id number]     Height:       64.0 in Accession #:    [id number]    Weight:       137.0 lb Date of Birth:  03-Mar-1941     BSA:          1.666 m Patient Age:    21 years      BP:           131/66 mmHg Patient Gender: F             HR:           67 bpm. Exam Location:  [facility] Procedure: 2D Echo, Cardiac Doppler and Color Doppler Indications:     Stroke I63.9  History:         Patient has prior history of Echocardiogram examinations, most                  recent 02/25/2021. COPD, Arrythmias:Atrial Fibrillation;                  Signs/Symptoms:Murmur.  Sonographer:     Sherrie Sport Referring Phys:  [id number] Ivor Costa Diagnosing Phys: Ida Rogue  MD  Sonographer Comments: No parasternal window and suboptimal apical window. Image acquisition challenging due to COPD. IMPRESSIONS  1. Left ventricular ejection fraction, by estimation, is 60 to 65%. The left ventricle has normal function. The left ventricle has no regional wall motion abnormalities. Left ventricular diastolic parameters are consistent with Grade I diastolic dysfunction (impaired relaxation).  2. Right ventricular systolic function is normal. The right ventricular size is normal. There is mildly elevated pulmonary artery systolic pressure. The estimated right ventricular systolic pressure is 55.3 mmHg.  3. The mitral valve is normal in structure. No evidence of mitral valve regurgitation. No evidence of mitral stenosis.  4. The aortic valve is normal in structure. Aortic valve regurgitation is mild to moderate. No aortic stenosis is present.  5. The inferior vena cava is normal in size with greater than 50% respiratory variability, suggesting right atrial  pressure of 3 mmHg. FINDINGS  Left Ventricle: Left ventricular ejection fraction, by estimation, is 60 to 65%. The left ventricle has normal function. The left ventricle has no regional wall motion abnormalities. The left ventricular internal cavity size was normal in size. There is  no left ventricular hypertrophy. Left ventricular diastolic parameters are consistent with Grade I diastolic dysfunction (impaired relaxation). Right Ventricle: The right ventricular size is normal. No increase in right ventricular wall thickness. Right ventricular systolic function is normal. There is mildly elevated pulmonary artery systolic pressure. The tricuspid regurgitant velocity is 3.00  m/s, and with an assumed right atrial pressure of 5 mmHg, the estimated right ventricular systolic pressure is 41.0 mmHg. Left Atrium: Left atrial size was normal in size. Right Atrium: Right atrial size was normal in size. Pericardium: There is no evidence of pericardial  effusion. Mitral Valve: The mitral valve is normal in structure. Mild mitral annular calcification. No evidence of mitral valve regurgitation. No evidence of mitral valve stenosis. MV peak gradient, 5.9 mmHg. The mean mitral valve gradient is 2.0 mmHg. Tricuspid Valve: The tricuspid valve is normal in structure. Tricuspid valve regurgitation is not demonstrated. No evidence of tricuspid stenosis. Aortic Valve: The aortic valve is normal in structure. Aortic valve regurgitation is mild to moderate. No aortic stenosis is present. Aortic valve mean gradient measures 2.5 mmHg. Aortic valve peak gradient measures 4.3 mmHg. Aortic valve area, by VTI measures 3.16 cm. Pulmonic Valve: The pulmonic valve was normal in structure. Pulmonic valve regurgitation is not visualized. No evidence of pulmonic stenosis. Aorta: The aortic root is normal in size and structure. Venous: The inferior vena cava is normal in size with greater than 50% respiratory variability, suggesting right atrial pressure of 3 mmHg. IAS/Shunts: No atrial level shunt detected by color flow Doppler.  LEFT VENTRICLE PLAX 2D LVIDd:         3.54 cm   Diastology LVIDs:         2.34 cm   LV e' medial:    5.22 cm/s LV PW:         0.98 cm   LV E/e' medial:  12.3 LV IVS:        0.89 cm   LV e' lateral:   7.40 cm/s LVOT diam:     2.00 cm   LV E/e' lateral: 8.7 LV SV:         66 LV SV Index:   40 LVOT Area:     3.14 cm  RIGHT VENTRICLE RV Basal diam:  3.40 cm RV S prime:     14.80 cm/s TAPSE (M-mode): 2.7 cm LEFT ATRIUM             Index        RIGHT ATRIUM           Index LA diam:        3.60 cm 2.16 cm/m   RA Area:     18.20 cm LA Vol (A2C):   91.5 ml 54.93 ml/m  RA Volume:   52.90 ml  31.76 ml/m LA Vol (A4C):   62.7 ml 37.64 ml/m LA Biplane Vol: 77.7 ml 46.64 ml/m  AORTIC VALVE AV Area (Vmax):    2.47 cm AV Area (Vmean):   2.53 cm AV Area (VTI):     3.16 cm AV Vmax:           104.05 cm/s AV Vmean:          68.000 cm/s AV VTI:  0.211 m AV Peak  Grad:      4.3 mmHg AV Mean Grad:      2.5 mmHg LVOT Vmax:         81.90 cm/s LVOT Vmean:        54.700 cm/s LVOT VTI:          0.212 m LVOT/AV VTI ratio: 1.00  AORTA Ao Root diam: 2.50 cm MITRAL VALVE                TRICUSPID VALVE MV Area (PHT): 2.76 cm     TR Peak grad:   36.0 mmHg MV Area VTI:   2.13 cm     TR Vmax:        300.00 cm/s MV Peak grad:  5.9 mmHg MV Mean grad:  2.0 mmHg     SHUNTS MV Vmax:       1.21 m/s     Systemic VTI:  0.21 m MV Vmean:      72.7 cm/s    Systemic Diam: 2.00 cm MV Decel Time: 275 msec MV E velocity: 64.30 cm/s MV A velocity: 117.00 cm/s MV E/A ratio:  0.55 Ida Rogue MD Electronically signed by Ida Rogue MD Signature Date/Time: 07/17/2021/4:35:51 PM    Final    [place] CORE BIOPSY (LYMPH NODES)  Result Date: 07/21/2021 INDICATION: 81 year old with history of lung cancer. 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 Plan for ultrasound-guided liver lesion biopsy. Patient also notes a new nodule on the left side of her neck. EXAM: ULTRASOUND-GUIDED LEFT CERVICAL LYMPH NODE BIOPSY MEDICATIONS: None. ANESTHESIA/SEDATION: None FLUOROSCOPY TIME:  None COMPLICATIONS: None immediate. PROCEDURE: Informed written consent was obtained from the patient after a thorough discussion of the procedural risks, benefits and alternatives. All questions were addressed. A timeout was performed prior to the initiation of the procedure. Liver was thoroughly evaluated with ultrasound. The liver is heterogeneous but a discrete lesion was not identified. Left side of the neck was evaluated with ultrasound and an abnormal small lymph node on the left side of the neck was identified. Left cervical lymph node was targeted for biopsy. The left side of the neck was prepped with chlorhexidine and sterile field was created. Skin was anesthetized with 1% lidocaine. Small incision was made. Using ultrasound guidance, an 18 gauge core device was directed into the  lymph node. Four core biopsies were obtained and placed on a Telfa pad with saline. Bandage placed over the puncture site. FINDINGS: Liver is heterogeneous but no discrete lesions could be identified. Therefore, the neck was evaluated for supraclavicular lymphadenopathy. Patient noted a bump on the left side of the neck and there was a rounded small abnormal lymph node at the area of concern. There is also a slightly prominent left supraclavicular lymph node which was not amenable for biopsy. The lymph node in the left mid neck was targeted and biopsied. Biopsy needle was confirmed within the lesion. No immediate bleeding or hematoma formation. IMPRESSION: 1. Ultrasound-guided core biopsy of a small but abnormal looking lymph node on the left side of the neck. 2. Ultrasound-guided liver biopsy was not performed because the liver lesions are not clearly visible on ultrasound. If the neck biopsy is inconclusive or negative, consider further evaluation with PET-CT. CT-guided liver lesion biopsy could be attempted as well. Electronically Signed   By: Markus Daft M.D.   On: 07/21/2021 15:33   CT CHEST ABDOMEN PELVIS WO CONTRAST  Result Date: 07/17/2021 CLINICAL  DATA:  Bone lesions seen on brain MRI. Evaluate for underlying malignancy. History of lung cancer. EXAM: CT CHEST, ABDOMEN AND PELVIS WITHOUT CONTRAST TECHNIQUE: Multidetector CT imaging of the chest, abdomen and pelvis was performed following the standard protocol without IV contrast. RADIATION DOSE REDUCTION: This exam was performed according to the departmental dose-optimization program which includes automated exposure control, adjustment of the mA and/or kV according to patient size and/or use of iterative reconstruction technique. COMPARISON:  Chest CT 04/10/2021 FINDINGS: CT CHEST FINDINGS Cardiovascular: The heart is normal in size. No pericardial effusion. The aorta is normal in caliber. Stable atherosclerotic calcifications. Remarkably no coronary  artery calcifications. Mediastinum/Nodes: Progressive left hilar adenopathy the, difficult to measure without contrast. New subcarinal adenopathy with 12.5 mm node on image 28/2. 8.5 mm right paratracheal node on image 20/2. Lungs/Pleura: Enlarging left upper lobe/suprahilar mass measuring approximately 3 cm on image 33/4. Findings consistent with recurrent lung cancer and left hilar and mediastinal adenopathy. No new pulmonary nodules to suggest pulmonary metastatic disease. Progressive right basilar scarring changes and streaky basilar atelectasis. Musculoskeletal: No breast masses are identified. No supraclavicular adenopathy. A few scattered axillary lymph nodes are stable. Suspect scattered subtle slightly sclerotic bone lesions. CT ABDOMEN PELVIS FINDINGS Hepatobiliary: New diffuse hepatic metastatic disease. Numerous small lesions throughout both lobes of the liver. The largest lesion at the right hepatic dome measures 2.5 cm on image 43/2. The gallbladder is surgically absent. No common bile duct dilatation. Pancreas: No mass, inflammation or ductal dilatation. Spleen: Normal size.  No focal lesions. Adrenals/Urinary Tract: Stable right adrenal gland nodule. No worrisome renal lesions are identified without contrast. 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 No complicating features. The small bowel and colon are grossly normal. Vascular/Lymphatic: Stable atherosclerotic calcifications involving the aorta and iliac arteries but no aneurysm. Small scattered mesenteric and retroperitoneal lymph nodes but no mass or overt adenopathy the. Reproductive: Surgically absent. Other: No pelvic mass or adenopathy. No free pelvic fluid collections. No inguinal mass or adenopathy. No abdominal wall hernia or subcutaneous lesions. Musculoskeletal: No lytic destructive bone lesions. No spinal canal compromise. IMPRESSION: 1. Enlarging left upper lobe/suprahilar mass with associated left hilar and  mediastinal adenopathy consistent with recurrent lung cancer. 2. New diffuse hepatic metastatic disease. 3. Suspect scattered subtle slightly sclerotic bone lesions. No lytic or destructive bone lesions. 4. PET-CT may be helpful for accurate staging, if necessary. 5. Stable right adrenal gland nodule. 6. 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 * onc * Aortic Atherosclerosis (ICD10-I70.0). Electronically Signed   By: Marijo Sanes M.D.   On: 07/17/2021 10:58    Labs: Basic Metabolic Panel: Recent Labs  Lab 07/22/21 0504 07/23/21 0558 07/24/21 0500 07/26/21 0648 07/28/21 0739  NA 135 134* 135 137 134*  K 4.4 4.1 3.9 3.8 4.1  CL 104 104 107 109 108  CO2 17* 19* 19* 20* 22  GLUCOSE 238* 224* 224* 194* 160*  BUN 66* 70* 60* 38* 24*  CREATININE 2.24* 2.08* 1.56* 1.32* 1.12*  CALCIUM 8.4* 8.0* 8.2* 8.1* 8.2*   CBC: Recent Labs  Lab 07/23/21 0558 07/25/21 0747 07/26/21 0648 07/27/21 0549 07/28/21 0212  WBC 10.7* 10.2 12.4* 15.3* 14.9*  HGB 9.4* 8.8* 8.6* 8.9* 8.5*  HCT 29.9* 27.5* 26.1* 27.4* 26.3*  MCV 87.9 86.8 86.7 86.2 86.2  PLT 70* 86* 134* 210 244   Microbiology: Lymph node biopsy-  Urine culture- pan-sensitive e coli. Patient was asymptomatic so not treated  Time coordinating discharge: Over 30 minutes  Richarda Osmond, MD  [facility] 07/28/2021, 10:14 AM

## 2021-07-28 NOTE — Progress Notes (Addendum)
Physical Therapy Treatment ?Patient Details ?Name: Natasha [name] ?MRN: [id number] ?DOB: 11/06/1940 ?Today's Date: 07/28/2021 ? ? ?History of Present Illness 81 y.o. female with history of atrial fibrillation on Eliquis, CHF, hypertension, hyperlipidemia, diabetes, COPD, chronic kidney disease, lung CA, depression, and recent R carpal tunnel release (Jul 15, 2021), who presents to the emergency department with complaints of vertigo. 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 ? ?  ?PT Comments  ? ? Pt awake and alert, resting in bed, w/ husband at bedside upon PT entrance into room for evaluation. Pt reports c/o pain at rest, but does not provide a quantitative value for pain. Pt is able to complete LE exercises in bed (see "General LE Exercise" section) w/o any assistance. She is able to sit EOB w/ modA mainly for trunk elevation. Once seated EOB she is able to maintain sitting balance for ~60min prior to sit to stand attempt. OT in room for mobility for co treat. She was able to perform sit to stand w/ maxAx2 using RW; but reports feeling very apprehensive about standing due to current level of pain. Pt frequency changed, plan to alternate PT/OT services moving forward. Pt will benefit from continued skilled PT in order to increase LE strength, improve mobility/gait, and restore PLOF. Current discharge recommendation remains appropriate due to the level of assistance required by the patient to ensure safety and improve overall function. ?   ?Recommendations for follow up therapy are one component of a multi-disciplinary discharge planning process, led by the attending physician.  Recommendations may be updated based on patient status, additional functional criteria and insurance authorization. ? ?Follow Up Recommendations ? Skilled nursing-short term rehab (<3 hours/day) ?   ?  ?Assistance Recommended at Discharge Frequent or constant Supervision/Assistance  ?Patient can return home with the following Help with stairs or ramp for entrance;Assist for transportation;Assistance with cooking/housework;Two people to help with walking and/or transfers;Direct supervision/assist for medications management;Direct supervision/assist for financial management;Two people to help with bathing/dressing/bathroom ?  ?Equipment Recommendations ? Rolling walker (2 wheels)  ?  ?Recommendations for Other Services   ? ? ?  ?Precautions / Restrictions Precautions ?Precautions: Fall ?Precaution Comments: recent carpal tunnel sx R UE ?Restrictions ?Weight Bearing Restrictions: No  ?  ? ?Mobility ? Bed Mobility ?Overal bed mobility: Needs Assistance ?Bed Mobility: Supine to Sit ?  ?  ?Supine to sit: Mod assist ?  ?  ?  ?  ? ?Transfers ?  ?Equipment used: Rolling walker (2 wheels) ?Transfers: Sit to/from Stand ?Sit to Stand: Max assist, From elevated surface, +2 physical assistance ?  ?  ?  ?  ?  ?  ?  ? ?Ambulation/Gait ?  ?  ?  ?  ?  ?  ?  ?  ? ? ?Stairs ?  ?  ?  ?  ?  ? ? ?Wheelchair Mobility ?  ? ?Modified Rankin (Stroke Patients Only) ?  ? ? ?  ?Balance Overall balance assessment: Needs assistance ?Sitting-balance support: Feet supported, Bilateral upper extremity supported ?Sitting balance-Leahy Scale: Fair ?  ?  ?Standing balance support: Bilateral upper extremity supported, Reliant on assistive device for balance ?Standing balance-Leahy Scale: Poor ?  ?  ?  ?  ?  ?  ?  ?  ?  ?  ?  ?  ?  ? ?  ?Cognition Arousal/Alertness: Awake/alert ?Behavior During Therapy: [facility]  for tasks assessed/performed ?Overall Cognitive Status: Within Functional Limits for tasks assessed ?  ?  ?  ?  ?  ?  ?  ?  ?  ?  ?  ?  ?  ?  ?  ?  ?General Comments: follows 1 step commands inconsistently, engages appropriately in conversation ?  ?  ? ?  ?Exercises General Exercises - Lower Extremity ?Heel Slides: AROM, Both, 10 reps ?Hip  ABduction/ADduction: AROM, Both, 10 reps ?Straight Leg Raises: AROM, Both, 10 reps ? ?  ?General Comments   ?  ?  ? ?Pertinent Vitals/Pain Pain Assessment ?Pain Assessment: Faces ?Faces Pain Scale: Hurts little more ?Pain Intervention(s): Limited activity within patient's tolerance, Monitored during session  ? ? ?Home Living   ?  ?  ?  ?  ?  ?  ?  ?  ?  ?   ?  ?Prior Function    ?  ?  ?   ? ?PT Goals (current goals can now be found in the care plan section) Progress towards PT goals: Not progressing toward goals - comment (limited progress) ? ?  ?Frequency ? ? ? Min 2X/week ? ? ? ?  ?PT Plan Current plan remains appropriate;Frequency needs to be updated  ? ? ?Co-evaluation PT/OT/SLP Co-Evaluation/Treatment: Yes ?Reason for Co-Treatment: Complexity of the patient's impairments (multi-system involvement);For patient/therapist safety;To address functional/ADL transfers ?PT goals addressed during session: Mobility/safety with mobility;Balance;Proper use of DME;Strengthening/ROM ?OT goals addressed during session: ADL's and self-care;Strengthening/ROM ?  ? ?  ?AM-PAC PT "6 Clicks" Mobility   ?Outcome Measure ? Help needed turning from your back to your side while in a flat bed without using bedrails?: A Lot ?Help needed moving from lying on your back to sitting on the side of a flat bed without using bedrails?: A Lot ?Help needed moving to and from a bed to a chair (including a wheelchair)?: Total ?Help needed standing up from a chair using your arms (e.g., wheelchair or bedside chair)?: Total ?Help needed to walk in hospital room?: Total ?Help needed climbing 3-5 steps with a railing? : Total ?6 Click Score: 8 ? ?  ?End of Session Equipment Utilized During Treatment: Gait belt ?Activity Tolerance: Patient tolerated treatment well;Patient limited by pain ?Patient left: in bed;Other (comment) (Working w/ OT) ?Nurse Communication: Mobility status ?PT Visit Diagnosis: Unsteadiness on feet (R26.81);Other abnormalities of  gait and mobility (R26.89);Difficulty in walking, not elsewhere classified (R26.2) ?  ? ? ?Time: 1344-1410 ?PT Time Calculation (min) (ACUTE ONLY): 26 min ? ?Charges:             ?          ? ? ?Jonnie Kind, SPT ?07/28/2021, 3:43 PM ? ?

## 2021-07-28 NOTE — TOC Progression Note (Signed)
Transition of Care (TOC) - Progression Note  ? ? ?Patient Details  ?Name: Natasha [name] ?MRN: [id number] ?Date of Birth: 10-Nov-1940 ? ?Transition of Care (TOC) CM/SW Contact  ?Anselm Pancoast, RN ?Phone Number: ?07/28/2021, 12:18 PM ? ?Clinical Narrative:    ?Spoke to [facility] and confirmed [facility] is unable to make bed offer due to cost of Oncology medications 17k/12k and potential for continued radiation treatments. Updated treatment team on need to review Oncology plan as relates to discharge planning.  ? ? ?  ?  ? ?Expected Discharge Plan and Services ?  ?  ?  ?  ?  ?Expected Discharge Date: 07/28/21               ?  ?  ?  ?  ?  ?  ?  ?  ?  ?  ? ? ?Social Determinants of Health (SDOH) Interventions ?  ? ?Readmission Risk Interventions ?No flowsheet data found. ? ?

## 2021-07-28 NOTE — TOC Progression Note (Addendum)
Transition of Care (TOC) - Progression Note  ? ? ?Patient Details  ?Name: Natasha [name] ?MRN: [id number] ?Date of Birth: November 22, 1940 ? ?Transition of Care (TOC) CM/SW Contact  ?Pete Pelt, RN ?Phone Number: ?07/28/2021, 1:41 PM ? ?Clinical Narrative:   Sharyn Lull from [facility] called to revoke bed offer, states that this is due to wound care.  TOC exploring other solutions, home etc.  TOC to follow. ? ?Addendum 1441:RNCM spoke to patient's spouse re: no bed offers.  Spouse discussed cancer treatments for patient.  He states that patient and family were not informed of details of treatment:  intensity of treatments, length of time, cost and anticipated outcomes of treatment course.  RNCM notified provider of family request for further information. ? ?  ?  ? ?Expected Discharge Plan and Services ?  ?  ?  ?  ?  ?Expected Discharge Date: 07/28/21               ?  ?  ?  ?  ?  ?  ?  ?  ?  ?  ? ? ?Social Determinants of Health (SDOH) Interventions ?  ? ?Readmission Risk Interventions ?No flowsheet data found. ? ?

## 2021-07-28 NOTE — Consult Note (Addendum)
ANTICOAGULATION CONSULT NOTE - Initial Consult ? ?Pharmacy Consult for Apixaban ?Indication: atrial fibrillation ? ?Allergies  ?Allergen Reactions  ? Atorvastatin   ?  Muscle/joint aches  ? Lantus [Insulin Glargine] Hives  ? Lyrica [Pregabalin] Other (See Comments)  ?  Headache, disorientation  ? Lisinopril Hives and Cough  ?   ?  ? ? ?Patient Measurements: ?Height: 5\' 4"  (162.6 cm) ?Weight: 68 kg (150 lb) ?IBW/kg (Calculated) : 54.7 ? ?Vital Signs: ?Temp: 98.2 ?F (36.8 ?C) (03/06 0515) ?Temp Source: Oral (03/06 0515) ?BP: 148/56 (03/06 0515) ?Pulse Rate: 88 (03/06 0515) ? ?Labs: ?Recent Labs  ?  07/26/21 ?0648 07/26/21 ?1608 07/27/21 ?2778 07/27/21 ?2423 07/27/21 ?1531 07/28/21 ?5361  ?HGB 8.6*  --  8.9*  --   --  8.5*  ?HCT 26.1*  --  27.4*  --   --  26.3*  ?PLT 134*  --  210  --   --  244  ?APTT 47*   < >  --  106* 157* 106*  ?HEPARINUNFRC 0.33  --  0.49  --   --  0.39  ?CREATININE 1.32*  --   --   --   --   --   ? < > = values in this interval not displayed.  ? ? ?Estimated Creatinine Clearance: 31.7 mL/min (A) (by C-G formula based on SCr of 1.32 mg/dL (H)). ? ? ?Medical History: ?Past Medical History:  ?Diagnosis Date  ? A-fib ([place])   ? a.) CHA2DS2-VASc Score = 6 (age x 2, sex, HTN, aortic plaque, T2DM). b.) rate/rhythm maintained on oral amiodarone + metoprolol succinate; chronically anticoagulated with full dose apixaban  ? Anemia   ? 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 Aortic atherosclerosis ([place])   ? Arthritis   ? Atrial flutter with rapid ventricular response ([place]) 02/24/2021  ? a.) in the setting of (+) SARS-CoV-2 infection; converted to NSR with increased dose of oral amiodarone.  ? Chronic cough   ? CKD (chronic kidney disease), stage III ([place])   ? Complication of anesthesia   ? COPD (chronic obstructive pulmonary disease) ([place])   ? Depression   ? Diastolic dysfunction   ? a.) TTE 04/08/2014: EF 60%; mild concentric LVH; G2DD. b.) TTE 12/21/2019: EF 60-65%, LA mildly dilated, mild-mod MR; PASP  36.8; G1DD. c.) TTE 02/25/2021: EF 60-65%; normal LV function with mild concentric LVH; G1DD  ? Diverticulitis   ? Dyspnea   ? High cholesterol   ? History of 2019 novel coronavirus disease (COVID-19) 02/24/2021  ? History of hiatal hernia   ? History of kidney stones   ? Hypertension   ? Insomnia   ? Long term current use of anticoagulant   ? a.) apixaban  ? Lumbar spinal stenosis   ? Mild asthma   ? Murmur   ? Non-small cell carcinoma of left lung, stage 1 ([place]) 09/30/2020  ? a.) clinical stage 1 (cT1cN0cM0). b.) treated with SBRT (60 cGy over 5 fractions).  ? OSA on CPAP   ? Osteoporosis   ? Restless leg   ? Sepsis ([place])   ? T2DM (type 2 diabetes mellitus) ([place])   ? ? ?Medications:  ?Scheduled:  ?  stroke: mapping our early stages of recovery book   Does not apply Once  ? amiodarone  200 mg Oral Daily  ? vitamin C  500 mg Oral BID  ? calcium-vitamin D  1 tablet Oral Daily  ? chlorhexidine  5 mL Mouth/Throat BID  ? Chlorhexidine Gluconate Cloth  6 each Topical Daily  ? feeding supplement (GLUCERNA SHAKE)  237 mL Oral TID BM  ? ferrous gluconate  324 mg Oral q AM  ? gabapentin  300 mg Oral QHS  ? insulin aspart  0-9 Units Subcutaneous TID WC  ? insulin glargine-yfgn  15 Units Subcutaneous Daily  ? multivitamin with minerals  1 tablet Oral Daily  ? polyethylene glycol  17 g Oral Daily  ? pramipexole  1 mg Oral QHS  ? rosuvastatin  10 mg Oral QHS  ? sertraline  50 mg Oral QHS  ? zinc sulfate  220 mg Oral Daily  ? ? ?Assessment: ?Patient admitted with acute embolic stroke. Pharmacy consulted to transition form heparin drip to Eliquis. Indication A.fib (CHADS-Vasc = 8) ?This patients CHA2DS2-VASc Score and unadjusted Ischemic Stroke Rate (% per year) is equal to 10.8 % stroke rate/year from a score of 8 ? ?Above score calculated as 1 point each if present [CHF, HTN, DM, Vascular=MI/PAD/Aortic Plaque, Age if 40-74, or Female] ?Above score calculated as 2 points each if present [Age > 75, or Stroke/TIA/TE] ? ?Goal of  Therapy:  ?Monitor platelets by anticoagulation protocol: Yes ?  ?Plan:  ?Stop heparin drip ?Start Eliquis at $RemoveBe'5mg'[id number]$  BID; age > 36, Scr < 1.5, Wt > 60kg (last BMP on 3/4, per MD request will wait for 3/6 BMP to result to confirm dose), then will enter order in epic. ?Scr 1.12 today 3/6 - Eliquis ordered at $RemoveBe'5mg'ZGsaYidIy$  BID ? ?Renae Mottley Rodriguez-Guzman PharmD, BCPS ?07/28/2021 7:56 AM ? ? ?

## 2021-07-28 NOTE — Care Management Important Message (Signed)
Important Message ? ?Patient Details  ?Name: Natasha [name] ?MRN: [id number] ?Date of Birth: 1941/02/16 ? ? ?Medicare Important Message Given:  Yes ? ? ? ? ?[name] A Galvin Aversa ?07/28/2021, 11:18 AM ?

## 2021-07-28 NOTE — Progress Notes (Signed)
Hematology/Oncology Progress note [phone] [phone]     Patient Care Team: Lesleigh Noe, MD as PCP - General (Family Medicine) Kate Sable, MD as PCP - Cardiology (Cardiology) Telford Nab, RN as Oncology Nurse Navigator   Name of the patient: Natasha [name]  [id number]  1941/03/25  Date of visit: 07/28/21   INTERVAL HISTORY-   #07/21/2021, left cervical lymph node biopsy pathology is positive for malignancy, metastatic adenocarcinoma, compatible with lung primary.  #07/22/2021 MRI brain without contrast showed multiple new embolic strokes since last MRI 5 days ago.   #07/25/2021 CT head wo expected CT appearance of the scattered bilateral infarcts on 07/22/2021 MRI. No associated hemorrhage or mass effect. No new intracranial abnormality. 07/25/2021 Restarted on heparin gtt without bolus.  Patient's husband was at bedside. Patient reports no new complaints. Lower extremity strength has improved.    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[place])    Brain metastases (HCC)    Pressure injury of skin 07/24/2021   Anemia    Thrombocytopenia (HCC)    CVA (cerebral vascular accident) ([place]) 07/18/2021   Acute ischemic stroke ([place]) 07/17/2021   HLD (hyperlipidemia) 07/17/2021   Type II diabetes mellitus with renal manifestations ([place]) 07/17/2021   COPD (chronic obstructive pulmonary disease) ([place]) 07/17/2021   CKD (chronic kidney disease), stage IV (HCC) 07/17/2021   Chronic diastolic CHF (congestive heart failure) ([place]) 07/17/2021   Leukocytosis 07/17/2021   Bone lesion 62/13/0865   Acute embolic stroke ([place]) 78/46/9629   Lung cancer metastatic to brain ([place])  07/17/2021   Carpal tunnel syndrome on right 07/17/2021   Lesion of liver    Vitamin B12 deficiency 07/14/2021   Localized swelling, mass and lump, neck 07/14/2021   Elevated sed rate 07/14/2021   Vitamin D deficiency 07/14/2021   CKD (chronic kidney disease) stage 4, GFR 15-29 ml/min (HCC) 06/24/2021   Polyarthralgia 06/16/2021   Muscle weakness 06/16/2021   Hyperkalemia 06/16/2021   Unsteadiness on feet 06/16/2021   Acute leg pain, right 06/16/2021   Double vision 03/13/2021   Dizziness 03/13/2021   COVID-19 virus infection 02/24/2021   Atrial flutter ([place]) 02/24/2021   Chronic bilateral low back pain with bilateral sciatica 02/17/2021   Decreased sensation 11/27/2020   Dermatitis 11/27/2020   Axillary lymphadenopathy 10/11/2020   Cancer of upper lobe of left lung ([place]) 10/11/2020   Goals of care, counseling/discussion 10/11/2020   Status post reverse total shoulder replacement, 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   Diverticulitis of colon 02/03/2020   Chronic pain syndrome 01/12/2019   Neuropathic pain 01/12/2019   At high risk for falls 11/02/2018   Neuroforaminal stenosis of lumbar spine 11/02/2018   Sacroiliitis ([place]) 11/02/2018   Former smoker 09/23/2018   Spinal stenosis of lumbar region without neurogenic claudication 09/23/2018   Weakness of both hands 09/23/2018   Overweight (BMI 25.0-29.9) 08/10/2018   Insomnia 06/17/2018   Chronic kidney disease with symptom management only, stage 3 (moderate) ([place]) 05/27/2018   Nonrheumatic aortic valve stenosis 05/24/2018   Iron deficiency anemia 12/24/2017   Paroxysmal atrial fibrillation ([place]) 12/24/2017   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/24/2017   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/2017  Gastroesophageal reflux disease 11/17/2016   OSA (obstructive sleep apnea) 07/24/2016   Diverticulosis 07/21/2016   Osteopenia of multiple sites 06/04/2016   Closed compression fracture of thoracic vertebra ([place]) 04/02/2015   Mixed hyperlipidemia  04/02/2015   Restless leg syndrome 04/02/2015   Closed fracture of lateral portion of left tibial plateau 04/06/2014   Essential hypertension 04/06/2014   Mild intermittent asthma without complication 07/05/1733   Type 2 diabetes mellitus with other specified complication ([place]) 67/05/4101     Past Medical History:  Diagnosis Date   A-fib ([place])    a.) CHA2DS2-VASc Score = 6 (age x 2, sex, HTN, aortic plaque, T2DM). 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   Chronic cough    CKD (chronic kidney disease), stage III (HCC)    Complication of anesthesia    COPD (chronic obstructive pulmonary disease) (HCC)    Depression    Diastolic dysfunction    a.) TTE 04/08/2014: EF 60%; mild concentric LVH; G2DD. b.) TTE 12/21/2019: EF 60-65%, LA mildly dilated, mild-mod MR; PASP 36.8; G1DD. c.) TTE 02/25/2021: EF 60-65%; normal LV function with mild concentric LVH; G1DD   Diverticulitis    Dyspnea    High cholesterol    History of 2019 novel coronavirus disease (COVID-19) 02/24/2021   History of hiatal hernia    History of kidney stones    Hypertension    Insomnia    Long term current use of anticoagulant    a.) apixaban   Lumbar spinal stenosis    Mild asthma    Murmur    Non-small cell carcinoma of left lung, stage 1 ([place]) 09/30/2020   a.) clinical stage 1 (cT1cN0cM0). b.) treated with SBRT (60 cGy over 5 fractions).   OSA on CPAP    Osteoporosis    Restless leg    Sepsis ([place])    T2DM (type 2 diabetes mellitus) ([place])      Past Surgical History:  Procedure Laterality Date   ABDOMINAL HYSTERECTOMY  1978   ANTERIOR INTEROSSEOUS NERVE  DECOMPRESSION Right 07/15/2021   Procedure: Cubital tunnel release;  Surgeon: Hessie Knows, MD;  Location: [facility];  Service: Orthopedics;  Laterality: Right;   APPENDECTOMY  1978   BREAST BIOPSY Left ?   papilloma   BREAST CYST EXCISION Bilateral yrs ago   benign, scars not well visualized   BREAST SURGERY     CARPAL TUNNEL RELEASE Left 04/22/2021   Procedure: Left carpal tunnel release & ulnar nerve release at elbow;  Surgeon: Hessie Knows, MD;  Location: [facility];  Service: Orthopedics;  Laterality: Left;   CARPAL TUNNEL RELEASE Right 07/15/2021   Procedure: CARPAL TUNNEL RELEASE;  Surgeon: Hessie Knows, MD;  Location: [facility];  Service: Orthopedics;  Laterality: Right;   CATARACT EXTRACTION W/ INTRAOCULAR LENS  IMPLANT, BILATERAL Bilateral    CHOLECYSTECTOMY     COLONOSCOPY     ELBOW SURGERY Right    [place] release   EYE SURGERY     FRACTURE SURGERY     GASTRIC BYPASS  12/22/2017   [place]-N-Y   JOINT REPLACEMENT     KNEE SURGERY Left    tibial fracture with metal plate  TOTAL SHOULDER REPLACEMENT Left 2014   ULNAR TUNNEL RELEASE Left 04/22/2021   Procedure: CUBITAL TUNNEL RELEASE;  Surgeon: Kennedy Bucker, MD;  Location: [facility];  Service: Orthopedics;  Laterality: Left;   VIDEO BRONCHOSCOPY WITH ENDOBRONCHIAL NAVIGATION N/A 09/30/2020   Procedure: ROBOTIC ASSISTED VIDEO BRONCHOSCOPY WITH ENDOBRONCHIAL NAVIGATION;  Surgeon: Salena Saner, MD;  Location: [facility];  Service: Pulmonary;  Laterality: N/A;   WRIST SURGERY Left    fractures    Social History   Socioeconomic History   Marital status: Married    Spouse name: Public house manager   Number of children: 1   Years of education: some college   Highest education level: Not on file  Occupational History   Not on file  Tobacco Use   Smoking status: Former    Packs/day: 1.00    Years: 12.00    Pack years: 12.00    Types: Cigarettes    Quit date: 05/26/1975    Years since quitting: 46.2    Passive exposure: Past    Smokeless tobacco: Never  Vaping Use   Vaping Use: Never used  Substance and Sexual Activity   Alcohol use: No    Comment: rarely   Drug use: Never   Sexual activity: Not Currently  Other Topics Concern   Not on file  Social History Narrative   07/22/20   [place]   Living: with husband, Public house manager [phone])   Work: retired - high end Academic librarian      Family: grandson - Harrold Donath Oct 27, 1998) (son is deceased) - and living with them      Enjoys: Enjoys Nutritional therapist, going to art shows, gardening and painting      Exercise: not currently   Diet: does not follow diabetic diet      Safety   Seat belts: Yes    Guns: Yes  and secure   Safe in relationships: Yes    Social Determinants of Health   Financial Resource Strain: Not on file  Food Insecurity: Not on file  Transportation Needs: Not on file  Physical Activity: Not on file  Stress: Not on file  Social Connections: Not on file  Intimate Partner Violence: Not on file     Family History  Problem Relation Age of Onset   Other Mother        died from surgery   AAA (abdominal aortic aneurysm) Mother    Diabetes Father        controlled by diet   Dementia Father        brain atrophy - unknown origin   Breast cancer Cousin        maternal     Current Facility-Administered Medications:     stroke: mapping our early stages of recovery book, , Does not apply, Once, Howerter, Justin B, DO   acetaminophen (TYLENOL) tablet 650 mg, 650 mg, Oral, Q6H PRN, Lorretta Harp, MD, 650 mg at 07/28/21 3876   amiodarone (PACERONE) tablet 200 mg, 200 mg, Oral, Daily, Jamelle Rushing L, MD, 200 mg at 07/28/21 6750   apixaban (ELIQUIS) tablet 5 mg, 5 mg, Oral, BID, Jamelle Rushing L, MD, 5 mg at 07/28/21 5973   ascorbic acid (VITAMIN C) tablet 500 mg, 500 mg, Oral, BID, Leeroy Bock, MD, 500 mg at 07/28/21 1065   calcium-vitamin D (OSCAL WITH D) 500-5 MG-MCG per tablet 1 tablet, 1 tablet, Oral,  Daily, Lorretta Harp, MD, 1 tablet at 07/28/21 [phone]  chlorhexidine (PERIDEX) 0.12 % solution 5 mL, 5 mL, Mouth/Throat, BID, Ivor Costa, MD, 5 mL at 07/27/21 0919   Chlorhexidine Gluconate Cloth 2 % PADS 6 each, 6 each, Topical, Daily, Richarda Osmond, MD, 6 each at 07/28/21 0928   dextromethorphan-guaiFENesin ([place] DM) 30-600 MG per 12 hr tablet 1 tablet, 1 tablet, Oral, BID PRN, Ivor Costa, MD   feeding supplement (GLUCERNA SHAKE) (GLUCERNA SHAKE) liquid 237 mL, 237 mL, Oral, TID BM, Doristine Mango L, MD, 237 mL at 07/27/21 1439   ferrous gluconate (FERGON) tablet 324 mg, 324 mg, Oral, q AM, Ivor Costa, MD, 324 mg at 07/28/21 0037   gabapentin (NEURONTIN) capsule 300 mg, 300 mg, Oral, QHS, Niu, Soledad Gerlach, MD, 300 mg at 07/27/21 2054   insulin aspart (novoLOG) injection 0-9 Units, 0-9 Units, Subcutaneous, TID WC, Ivor Costa, MD, 1 Units at 07/28/21 1716   insulin glargine-yfgn (SEMGLEE) injection 15 Units, 15 Units, Subcutaneous, Daily, Richarda Osmond, MD, 15 Units at 07/28/21 0488   levalbuterol (XOPENEX) nebulizer solution 1.25 mg, 1.25 mg, Inhalation, Q8H PRN, Ivor Costa, MD   LORazepam (ATIVAN) tablet 0.5 mg, 0.5 mg, Oral, BID PRN, Richarda Osmond, MD   multivitamin with minerals tablet 1 tablet, 1 tablet, Oral, Daily, Richarda Osmond, MD, 1 tablet at 07/28/21 0926   ondansetron (ZOFRAN) injection 4 mg, 4 mg, Intravenous, Q8H PRN, Ivor Costa, MD   polyethylene glycol (MIRALAX / GLYCOLAX) packet 17 g, 17 g, Oral, Daily, Wyvonnia Dusky, MD, 17 g at 07/28/21 8916   pramipexole (MIRAPEX) tablet 1 mg, 1 mg, Oral, QHS, Niu, Soledad Gerlach, MD, 1 mg at 07/27/21 2054   rosuvastatin (CRESTOR) tablet 10 mg, 10 mg, Oral, QHS, Jimmye Norman, Jamiese M, MD, 10 mg at 07/27/21 2054   sertraline (ZOLOFT) tablet 50 mg, 50 mg, Oral, QHS, Niu, Soledad Gerlach, MD, 50 mg at 07/27/21 2054   traMADol (ULTRAM) tablet 50 mg, 50 mg, Oral, Q12H PRN, Richarda Osmond, MD, 50 mg at 07/28/21 1507   zinc sulfate capsule  220 mg, 220 mg, Oral, Daily, Richarda Osmond, MD, 220 mg at 07/28/21 0926   Physical exam:  Vitals:   07/28/21 0500 07/28/21 0515 07/28/21 0807 07/28/21 1128  BP:  (!) 148/56 (!) 131/57 (!) 127/56  Pulse:  88 84 82  Resp:  $Remo'18 18 18  'nPBbm$ Temp:  98.2 F (36.8 C) 98.4 F (36.9 C) 97.7 F (36.5 C)  TempSrc:  Oral Oral Oral  SpO2:  96% 97% 96%  Weight: 150 lb (68 kg)     Height:       Physical Exam HENT:     Head: Normocephalic and atraumatic.  Cardiovascular:     Rate and Rhythm: Normal rate.  Pulmonary:     Effort: No respiratory distress.  Abdominal:     General: Abdomen is flat. There is no distension.     Palpations: Abdomen is soft.  Musculoskeletal:        General: Normal range of motion.     Cervical back: Normal range of motion.  Skin:    General: Skin is warm and dry.  Neurological:     Mental Status: She is alert and oriented to person, place, and time.     Comments: Decreased strength of LUE, and bilateral lower extremities  Psychiatric:        Mood and Affect: Mood normal.       CMP Latest Ref Rng & Units 07/28/2021  Glucose 70 - 99 mg/dL 160(H)  BUN 8 -  23 mg/dL 24(H)  Creatinine 0.44 - 1.00 mg/dL 1.12(H)  Sodium 135 - 145 mmol/L 134(L)  Potassium 3.5 - 5.1 mmol/L 4.1  Chloride 98 - 111 mmol/L 108  CO2 22 - 32 mmol/L 22  Calcium 8.9 - 10.3 mg/dL 8.2(L)  Total Protein 6.5 - 8.1 g/dL -  Total Bilirubin 0.3 - 1.2 mg/dL -  Alkaline Phos 38 - 126 U/L -  AST 15 - 41 U/L -  ALT 0 - 44 U/L -   CBC Latest Ref Rng & Units 07/28/2021  WBC 4.0 - 10.5 K/uL 14.9(H)  Hemoglobin 12.0 - 15.0 g/dL 8.5(L)  Hematocrit 36.0 - 46.0 % 26.3(L)  Platelets 150 - 400 K/uL 244    RADIOGRAPHIC STUDIES: I have personally reviewed the radiological images as listed and agreed with the findings in the report. CT HEAD WO CONTRAST (5MM)  Result Date: 07/25/2021 CLINICAL DATA:  81 year old female with recent neurologic deficit and embolic appearing infarcts scattered throughout  the brain on 07/22/2021. EXAM: CT HEAD WITHOUT CONTRAST TECHNIQUE: Contiguous axial images were obtained from the base of the skull through the vertex without intravenous contrast. RADIATION DOSE REDUCTION: This exam was performed according to the departmental dose-optimization program which includes automated exposure control, adjustment of the mA and/or kV according to patient size and/or use of iterative reconstruction technique. COMPARISON:  Brain MRI 07/22/2021.  Head CT 07/17/2021. FINDINGS: Brain: Development of cytotoxic edema corresponding to the DWI positive infarcts on the recent MRI, most conspicuous in the left occipital pole (series 2, image 12), right centrum semiovale (image 18), and cerebellum (image 9). No associated hemorrhage. No mass effect. Stable gray-white matter differentiation otherwise. No ventriculomegaly. Normal basilar cisterns. Vascular: Calcified atherosclerosis at the skull base. No suspicious intracranial vascular hyperdensity. Skull: Osteopenia.  No acute osseous abnormality identified. Sinuses/Orbits: Trace fluid layering in the left sphenoid sinus. Other Visualized paranasal sinuses and mastoids are clear. Other: No acute orbit or scalp soft tissue finding. IMPRESSION: 1. Expected CT appearance of the scattered bilateral infarcts on 07/22/2021 MRI. No associated hemorrhage or mass effect. 2. No new intracranial abnormality. Electronically Signed   By: Genevie Ann M.D.   On: 07/25/2021 08:56   CT Head Wo Contrast  Result Date: 07/17/2021 CLINICAL DATA:  Nonspecific dizziness EXAM: CT HEAD WITHOUT CONTRAST TECHNIQUE: Contiguous axial images were obtained from the base of the skull through the vertex without intravenous contrast. RADIATION DOSE REDUCTION: This exam was performed according to the departmental dose-optimization program which includes automated exposure control, adjustment of the mA and/or kV according to patient size and/or use of iterative reconstruction technique.  COMPARISON:  03/09/2021 FINDINGS: Brain: 2 small right cerebellar infarcts not seen on prior, possibly recent and symptomatic. Few remote supratentorial white matter insults. No hemorrhage, hydrocephalus, or collection. Age normal brain volume. Vascular: No hyperdense vessel or unexpected calcification. Skull: Normal. Negative for fracture or focal lesion. Sinuses/Orbits: No acute finding. 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 Electronically Signed   By: Jorje Guild M.D.   On: 07/17/2021 04:32   MR ANGIO HEAD WO CONTRAST  Result Date: 07/17/2021 CLINICAL DATA:  Dizziness, infarcts on MRI EXAM: MRA NECK WITHOUT CONTRAST MRA HEAD WITHOUT CONTRAST TECHNIQUE: Angiographic images of the Circle of Willis were acquired using MRA technique without intravenous contrast. COMPARISON:  None. FINDINGS: MRA NECK FINDINGS Standard aortic branching. Common, internal, and external carotid arteries are patent, without hemodynamically significant stenosis. Extracranial vertebral arteries are patent, without  hemodynamically significant stenosis, although imaging of the origins is somewhat limited by artifact. MRA HEAD FINDINGS Both internal carotid arteries are patent to the termini, without significant stenosis. A1 segments patent. Normal anterior communicating artery. Anterior cerebral arteries are patent to their distal aspects. No M1 stenosis or occlusion. Normal MCA bifurcations. Distal MCA branches perfused and symmetric. Vertebral arteries patent to the vertebrobasilar junction without stenosis. Basilar patent to its distal aspect. Superior cerebellar arteries patent bilaterally. Patent P1 segments, diminutive on the right. Near fetal origin of the right PCA with patent right posterior communicating artery. PCAs perfused to their distal aspects without stenosis. Possible diminutive left posterior communicating artery. IMPRESSION: 1.  No  intracranial large vessel occlusion or significant stenosis. 2.  No hemodynamically significant stenosis in the neck. Electronically Signed   By: Merilyn Baba M.D.   On: 07/17/2021 23:08   MR ANGIO NECK WO CONTRAST  Result Date: 07/17/2021 CLINICAL DATA:  Dizziness, infarcts on MRI EXAM: MRA NECK WITHOUT CONTRAST MRA HEAD WITHOUT CONTRAST TECHNIQUE: Angiographic images of the Circle of Willis were acquired using MRA technique without intravenous contrast. COMPARISON:  None. FINDINGS: MRA NECK FINDINGS Standard aortic branching. Common, internal, and external carotid arteries are patent, without hemodynamically significant stenosis. Extracranial vertebral arteries are patent, without hemodynamically significant stenosis, although imaging of the origins is somewhat limited by artifact. MRA HEAD FINDINGS Both internal carotid arteries are patent to the termini, without significant stenosis. A1 segments patent. Normal anterior communicating artery. Anterior cerebral arteries are patent to their distal aspects. No M1 stenosis or occlusion. Normal MCA bifurcations. Distal MCA branches perfused and symmetric. Vertebral arteries patent to the vertebrobasilar junction without stenosis. Basilar patent to its distal aspect. Superior cerebellar arteries patent bilaterally. Patent P1 segments, diminutive on the right. Near fetal origin of the right PCA with patent right posterior communicating artery. PCAs perfused to their distal aspects without stenosis. Possible diminutive left posterior communicating artery. IMPRESSION: 1.  No intracranial large vessel occlusion or significant stenosis. 2.  No hemodynamically significant stenosis in the neck. Electronically Signed   By: Merilyn Baba M.D.   On: 07/17/2021 23:08   MR BRAIN WO CONTRAST  Result Date: 07/22/2021 CLINICAL DATA:  Acute neuro deficit. Stroke. Now with bilateral leg weakness and diplopia. EXAM: MRI HEAD WITHOUT CONTRAST TECHNIQUE: Multiplanar, multiecho  pulse sequences of the brain and surrounding structures were obtained without intravenous contrast. COMPARISON:  MRI head 07/17/2021 FINDINGS: Brain: Numerous areas of acute infarct are present compatible with emboli. Multiple small infarcts in the cerebellum bilaterally are stable. Progressive acute infarct in the left occipital pole with mild associated petechial hemorrhage which was seen previously. Multiple small areas of acute infarct in the frontal and parietal lobes bilaterally and in the white matter. Progression of cluster of acute infarcts in the right parietal white matter and also in the right medial parietal cortex. Small acute infarct head of caudate on the left unchanged. Small acute infarct head of caudate on the right is new. Ventricle size normal.  No mass or midline shift. Vascular: Normal arterial flow voids. Skull and upper cervical spine: Bilateral calvarial lesions are again noted. These are suspicious for metastatic disease. Sinuses/Orbits: Paranasal sinuses clear. Bilateral cataract extraction Other: None IMPRESSION: Numerous areas of acute infarct in the cerebrum and cerebellum bilaterally compatible with acute embolic infarction. There has been progression of acute infarcts since the recent MRI of 07/17/2021. Findings suggest recurrent emboli. Progression of infarct left occipital pole. Petechial hemorrhage in this area unchanged from the  prior study. No other hemorrhage. Lesions in the calvarium bilaterally, suspicious for metastatic disease. Electronically Signed   By: Franchot Gallo M.D.   On: 07/22/2021 14:59   MR BRAIN WO CONTRAST  Result Date: 07/17/2021 CLINICAL DATA:  Nonspecific dizziness. EXAM: MRI HEAD WITHOUT CONTRAST TECHNIQUE: Multiplanar, multiecho pulse sequences of the brain and surrounding structures were obtained without intravenous contrast. COMPARISON:  Head CT from earlier today FINDINGS: Brain: Patchy acute infarcts in the bilateral cerebellum and bilateral  frontal, parietal, and occipital convexities. Patchy acute infarct in the right more than left centrum semiovale and in the left caudate head. Mild petechial hemorrhage at the right occipital cortex. No hematoma, hydrocephalus, or collection. Vascular: Normal flow voids Skull and upper cervical spine: New scattered bone lesions in the C2 right articular process, right para median clivus tip, and in the bilateral calvarium, affected areas marked on sagittal T2 weighted imaging. Sinuses/Orbits: Negative IMPRESSION: 1. Numerous small acute infarcts scattered in the brain and compatible with central embolic disease. 2. 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 Recommend metastatic workup. Electronically Signed   By: Jorje Guild M.D.   On: 07/17/2021 05:42   ECHOCARDIOGRAM COMPLETE  Result Date: 07/17/2021    ECHOCARDIOGRAM REPORT   Patient Name:   [facility] Date of Exam: 07/17/2021 Medical Rec #:  [id number]     Height:       64.0 in Accession #:    [id number]    Weight:       137.0 lb Date of Birth:  June 14, 1940     BSA:          1.666 m Patient Age:    20 years      BP:           131/66 mmHg Patient Gender: F             HR:           67 bpm. Exam Location:  [facility] Procedure: 2D Echo, Cardiac Doppler and Color Doppler Indications:     Stroke I63.9  History:         Patient has prior history of Echocardiogram examinations, most                  recent 02/25/2021. COPD, Arrythmias:Atrial Fibrillation;                  Signs/Symptoms:Murmur.  Sonographer:     Sherrie Sport Referring Phys:  [id number] Ivor Costa Diagnosing Phys: Ida Rogue MD  Sonographer Comments: No parasternal window and suboptimal apical window. Image acquisition challenging due to COPD. IMPRESSIONS  1. Left ventricular ejection fraction, by estimation, is 60 to 65%. The left ventricle has normal function. The left ventricle has no regional wall motion abnormalities. Left ventricular diastolic parameters are consistent with  Grade I diastolic dysfunction (impaired relaxation).  2. Right ventricular systolic function is normal. The right ventricular size is normal. There is mildly elevated pulmonary artery systolic pressure. The estimated right ventricular systolic pressure is 08.6 mmHg.  3. The mitral valve is normal in structure. No evidence of mitral valve regurgitation. No evidence of mitral stenosis.  4. The aortic valve is normal in structure. Aortic valve regurgitation is mild to moderate. No aortic stenosis is present.  5. The inferior vena cava is normal in size with greater than 50% respiratory variability, suggesting right atrial pressure of 3 mmHg. FINDINGS  Left Ventricle: Left ventricular ejection fraction, by estimation, is 60 to 65%. The  left ventricle has normal function. The left ventricle has no regional wall motion abnormalities. The left ventricular internal cavity size was normal in size. There is  no left ventricular hypertrophy. Left ventricular diastolic parameters are consistent with Grade I diastolic dysfunction (impaired relaxation). Right Ventricle: The right ventricular size is normal. No increase in right ventricular wall thickness. Right ventricular systolic function is normal. There is mildly elevated pulmonary artery systolic pressure. The tricuspid regurgitant velocity is 3.00  m/s, and with an assumed right atrial pressure of 5 mmHg, the estimated right ventricular systolic pressure is 96.7 mmHg. Left Atrium: Left atrial size was normal in size. Right Atrium: Right atrial size was normal in size. Pericardium: There is no evidence of pericardial effusion. Mitral Valve: The mitral valve is normal in structure. Mild mitral annular calcification. No evidence of mitral valve regurgitation. No evidence of mitral valve stenosis. MV peak gradient, 5.9 mmHg. The mean mitral valve gradient is 2.0 mmHg. Tricuspid Valve: The tricuspid valve is normal in structure. Tricuspid valve regurgitation is not demonstrated.  No evidence of tricuspid stenosis. Aortic Valve: The aortic valve is normal in structure. Aortic valve regurgitation is mild to moderate. No aortic stenosis is present. Aortic valve mean gradient measures 2.5 mmHg. Aortic valve peak gradient measures 4.3 mmHg. Aortic valve area, by VTI measures 3.16 cm. Pulmonic Valve: The pulmonic valve was normal in structure. Pulmonic valve regurgitation is not visualized. No evidence of pulmonic stenosis. Aorta: The aortic root is normal in size and structure. Venous: The inferior vena cava is normal in size with greater than 50% respiratory variability, suggesting right atrial pressure of 3 mmHg. IAS/Shunts: No atrial level shunt detected by color flow Doppler.  LEFT VENTRICLE PLAX 2D LVIDd:         3.54 cm   Diastology LVIDs:         2.34 cm   LV e' medial:    5.22 cm/s LV PW:         0.98 cm   LV E/e' medial:  12.3 LV IVS:        0.89 cm   LV e' lateral:   7.40 cm/s LVOT diam:     2.00 cm   LV E/e' lateral: 8.7 LV SV:         66 LV SV Index:   40 LVOT Area:     3.14 cm  RIGHT VENTRICLE RV Basal diam:  3.40 cm RV S prime:     14.80 cm/s TAPSE (M-mode): 2.7 cm LEFT ATRIUM             Index        RIGHT ATRIUM           Index LA diam:        3.60 cm 2.16 cm/m   RA Area:     18.20 cm LA Vol (A2C):   91.5 ml 54.93 ml/m  RA Volume:   52.90 ml  31.76 ml/m LA Vol (A4C):   62.7 ml 37.64 ml/m LA Biplane Vol: 77.7 ml 46.64 ml/m  AORTIC VALVE AV Area (Vmax):    2.47 cm AV Area (Vmean):   2.53 cm AV Area (VTI):     3.16 cm AV Vmax:           104.05 cm/s AV Vmean:          68.000 cm/s AV VTI:            0.211 m AV Peak Grad:  4.3 mmHg AV Mean Grad:      2.5 mmHg LVOT Vmax:         81.90 cm/s LVOT Vmean:        54.700 cm/s LVOT VTI:          0.212 m LVOT/AV VTI ratio: 1.00  AORTA Ao Root diam: 2.50 cm MITRAL VALVE                TRICUSPID VALVE MV Area (PHT): 2.76 cm     TR Peak grad:   36.0 mmHg MV Area VTI:   2.13 cm     TR Vmax:        300.00 cm/s MV Peak grad:  5.9 mmHg  MV Mean grad:  2.0 mmHg     SHUNTS MV Vmax:       1.21 m/s     Systemic VTI:  0.21 m MV Vmean:      72.7 cm/s    Systemic Diam: 2.00 cm MV Decel Time: 275 msec MV E velocity: 64.30 cm/s MV A velocity: 117.00 cm/s MV E/A ratio:  0.55 Ida Rogue MD Electronically signed by Ida Rogue MD Signature Date/Time: 07/17/2021/4:35:51 PM    Final    [place] CORE BIOPSY (LYMPH NODES)  Result Date: 07/21/2021 INDICATION: 81 year old with history of lung cancer. 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 Plan for ultrasound-guided liver lesion biopsy. Patient also notes a new nodule on the left side of her neck. EXAM: ULTRASOUND-GUIDED LEFT CERVICAL LYMPH NODE BIOPSY MEDICATIONS: None. ANESTHESIA/SEDATION: None FLUOROSCOPY TIME:  None COMPLICATIONS: None immediate. PROCEDURE: Informed written consent was obtained from the patient after a thorough discussion of the procedural risks, benefits and alternatives. All questions were addressed. A timeout was performed prior to the initiation of the procedure. Liver was thoroughly evaluated with ultrasound. The liver is heterogeneous but a discrete lesion was not identified. Left side of the neck was evaluated with ultrasound and an abnormal small lymph node on the left side of the neck was identified. Left cervical lymph node was targeted for biopsy. The left side of the neck was prepped with chlorhexidine and sterile field was created. Skin was anesthetized with 1% lidocaine. Small incision was made. Using ultrasound guidance, an 18 gauge core device was directed into the lymph node. Four core biopsies were obtained and placed on a Telfa pad with saline. Bandage placed over the puncture site. FINDINGS: Liver is heterogeneous but no discrete lesions could be identified. Therefore, the neck was evaluated for supraclavicular lymphadenopathy. Patient noted a bump on the left side of the neck and there was a rounded small abnormal lymph  node at the area of concern. There is also a slightly prominent left supraclavicular lymph node which was not amenable for biopsy. The lymph node in the left mid neck was targeted and biopsied. Biopsy needle was confirmed within the lesion. No immediate bleeding or hematoma formation. IMPRESSION: 1. Ultrasound-guided core biopsy of a small but abnormal looking lymph node on the left side of the neck. 2. Ultrasound-guided liver biopsy was not performed because the liver lesions are not clearly visible on ultrasound. If the neck biopsy is inconclusive or negative, consider further evaluation with PET-CT. CT-guided liver lesion biopsy could be attempted as well. Electronically Signed   By: Markus Daft M.D.   On: 07/21/2021 15:33   CT CHEST ABDOMEN PELVIS WO CONTRAST  Result Date: 07/17/2021 CLINICAL DATA:  Bone lesions seen on brain MRI. Evaluate for  underlying malignancy. History of lung cancer. EXAM: CT CHEST, ABDOMEN AND PELVIS WITHOUT CONTRAST TECHNIQUE: Multidetector CT imaging of the chest, abdomen and pelvis was performed following the standard protocol without IV contrast. RADIATION DOSE REDUCTION: This exam was performed according to the departmental dose-optimization program which includes automated exposure control, adjustment of the mA and/or kV according to patient size and/or use of iterative reconstruction technique. COMPARISON:  Chest CT 04/10/2021 FINDINGS: CT CHEST FINDINGS Cardiovascular: The heart is normal in size. No pericardial effusion. The aorta is normal in caliber. Stable atherosclerotic calcifications. Remarkably no coronary artery calcifications. Mediastinum/Nodes: Progressive left hilar adenopathy the, difficult to measure without contrast. New subcarinal adenopathy with 12.5 mm node on image 28/2. 8.5 mm right paratracheal node on image 20/2. Lungs/Pleura: Enlarging left upper lobe/suprahilar mass measuring approximately 3 cm on image 33/4. Findings consistent with recurrent lung  cancer and left hilar and mediastinal adenopathy. No new pulmonary nodules to suggest pulmonary metastatic disease. Progressive right basilar scarring changes and streaky basilar atelectasis. Musculoskeletal: No breast masses are identified. No supraclavicular adenopathy. A few scattered axillary lymph nodes are stable. Suspect scattered subtle slightly sclerotic bone lesions. CT ABDOMEN PELVIS FINDINGS Hepatobiliary: New diffuse hepatic metastatic disease. Numerous small lesions throughout both lobes of the liver. The largest lesion at the right hepatic dome measures 2.5 cm on image 43/2. The gallbladder is surgically absent. No common bile duct dilatation. Pancreas: No mass, inflammation or ductal dilatation. Spleen: Normal size.  No focal lesions. Adrenals/Urinary Tract: Stable right adrenal gland nodule. No worrisome renal lesions are identified without contrast. 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 No complicating features. The small bowel and colon are grossly normal. Vascular/Lymphatic: Stable atherosclerotic calcifications involving the aorta and iliac arteries but no aneurysm. Small scattered mesenteric and retroperitoneal lymph nodes but no mass or overt adenopathy the. Reproductive: Surgically absent. Other: No pelvic mass or adenopathy. No free pelvic fluid collections. No inguinal mass or adenopathy. No abdominal wall hernia or subcutaneous lesions. Musculoskeletal: No lytic destructive bone lesions. No spinal canal compromise. IMPRESSION: 1. Enlarging left upper lobe/suprahilar mass with associated left hilar and mediastinal adenopathy consistent with recurrent lung cancer. 2. New diffuse hepatic metastatic disease. 3. Suspect scattered subtle slightly sclerotic bone lesions. No lytic or destructive bone lesions. 4. PET-CT may be helpful for accurate staging, if necessary. 5. Stable right adrenal gland nodule. 6. 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 * onc  * Aortic Atherosclerosis (ICD10-I70.0). Electronically Signed   By: Marijo Sanes M.D.   On: 07/17/2021 10:58    Assessment and plan-   # recurrent metastatic lung cancer, with liver and bone metastasis. Status post ultrasound-guided left cervical lymphadenopathy.  Pathology is positive for adenocarcinoma, lung origin.   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 NGS on current specimen has been sent and result is pending.  If she does not have any targetable mutation, then treatment option will be chemotherapy plus minus immunotherapy.  #Acute embolic stroke, recurrent.  Secondary to A-fib and likely underlying hypercoagulable state due to cancer. Switched to Eliquis $RemoveBe'5mg'cfAxnAKDd$  BID today.   #Anemia due to CKD continue oral iron supplementation.  #Thrombocytopenia, resolved.   Patient needs to follow-up outpatient at the cancer center to review pathology results and management plan. Thank you for allowing me to participate in the care of this patient.   Earlie Server, MD, PhD Hematology Oncology 07/28/2021

## 2021-07-28 NOTE — Progress Notes (Signed)
ANTICOAGULATION CONSULT NOTE - Initial Consult ? ?Pharmacy Consult for Heparin drip ?Indication: atrial fibrillation ? ?Allergies  ?Allergen Reactions  ? Atorvastatin   ?  Muscle/joint aches  ? Lantus [Insulin Glargine] Hives  ? Lyrica [Pregabalin] Other (See Comments)  ?  Headache, disorientation  ? Lisinopril Hives and Cough  ?   ?  ? ? ?Patient Measurements: ?Height: 5\' 4"  (162.6 cm) ?Weight: 64.4 kg (141 lb 15.6 oz) ?IBW/kg (Calculated) : 54.7 ?Heparin Dosing Weight: 62.1kg  ? ?Vital Signs: ?Temp: 98.1 ?F (36.7 ?C) (03/05 2058) ?Temp Source: Oral (03/05 2058) ?BP: 119/53 (03/05 2058) ?Pulse Rate: 85 (03/05 2058) ? ?Labs: ?Recent Labs  ?  07/26/21 ?0648 07/26/21 ?1608 07/27/21 ?1224 07/27/21 ?4975 07/27/21 ?1531 07/28/21 ?3005  ?HGB 8.6*  --  8.9*  --   --  8.5*  ?HCT 26.1*  --  27.4*  --   --  26.3*  ?PLT 134*  --  210  --   --  244  ?APTT 47*   < >  --  106* 157* 106*  ?HEPARINUNFRC 0.33  --  0.49  --   --  0.39  ?CREATININE 1.32*  --   --   --   --   --   ? < > = values in this interval not displayed.  ? ? ? ?Estimated Creatinine Clearance: 28.9 mL/min (A) (by C-G formula based on SCr of 1.32 mg/dL (H)). ? ? ?Medical History: ?Past Medical History:  ?Diagnosis Date  ? A-fib ([place])   ? a.) CHA2DS2-VASc Score = 6 (age x 2, sex, HTN, aortic plaque, T2DM). b.) rate/rhythm maintained on oral amiodarone + metoprolol succinate; chronically anticoagulated with full dose apixaban  ? Anemia   ? 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 Aortic atherosclerosis ([place])   ? Arthritis   ? Atrial flutter with rapid ventricular response ([place]) 02/24/2021  ? a.) in the setting of (+) SARS-CoV-2 infection; converted to NSR with increased dose of oral amiodarone.  ? Chronic cough   ? CKD (chronic kidney disease), stage III ([place])   ? Complication of anesthesia   ? COPD (chronic obstructive pulmonary disease) ([place])   ? Depression   ? Diastolic dysfunction   ? a.) TTE 04/08/2014: EF 60%; mild concentric LVH; G2DD. b.) TTE 12/21/2019:  EF 60-65%, LA mildly dilated, mild-mod MR; PASP 36.8; G1DD. c.) TTE 02/25/2021: EF 60-65%; normal LV function with mild concentric LVH; G1DD  ? Diverticulitis   ? Dyspnea   ? High cholesterol   ? History of 2019 novel coronavirus disease (COVID-19) 02/24/2021  ? History of hiatal hernia   ? History of kidney stones   ? Hypertension   ? Insomnia   ? Long term current use of anticoagulant   ? a.) apixaban  ? Lumbar spinal stenosis   ? Mild asthma   ? Murmur   ? Non-small cell carcinoma of left lung, stage 1 ([place]) 09/30/2020  ? a.) clinical stage 1 (cT1cN0cM0). b.) treated with SBRT (60 cGy over 5 fractions).  ? OSA on CPAP   ? Osteoporosis   ? Restless leg   ? Sepsis ([place])   ? T2DM (type 2 diabetes mellitus) ([place])   ? ? ?Medications:  ?Scheduled:  ?  stroke: mapping our early stages of recovery book   Does not apply Once  ? amiodarone  200 mg Oral Daily  ? vitamin C  500 mg Oral BID  ? calcium-vitamin D  1 tablet Oral Daily  ? chlorhexidine  5 mL Mouth/Throat BID  ? Chlorhexidine Gluconate Cloth  6 each Topical Daily  ? feeding supplement (GLUCERNA SHAKE)  237 mL Oral TID BM  ? ferrous gluconate  324 mg Oral q AM  ? gabapentin  300 mg Oral QHS  ? insulin aspart  0-9 Units Subcutaneous TID WC  ? insulin glargine-yfgn  15 Units Subcutaneous Daily  ? multivitamin with minerals  1 tablet Oral Daily  ? polyethylene glycol  17 g Oral Daily  ? pramipexole  1 mg Oral QHS  ? rosuvastatin  10 mg Oral QHS  ? sertraline  50 mg Oral QHS  ? zinc sulfate  220 mg Oral Daily  ? ?Infusions:  ? heparin 1,000 Units/hr (07/27/21 1823)  ? ? ?Assessment: ?81 yo F starting Heparin drip for Afib. Patient on Apixaban PTA-currently holding-last dose given 2/28 at 0825 ? ?Baseline Labs: ?Hgb 8.8  plt 86  HL 0.39 aPTT 39 ? ? ?3/3:  aPTT @ 2052 = 101, therapeutic  X 1  ?3/4:  aPTT @ 0648 = 47, subtherapeutic(HL: 0.33) ?3/4 aPTT @ 1600 = 56, subtherapeutic ?3/5 aPTT @ 0056 = > 200 ?3/5 aPTT @ 6002 = 106, therapeutic  ?3/5 aPTT @ 1531 = 157,  supratherapeutic  ?3/6 aPTT @ 0212 = 106, supratherapeutic  ? ?Goal of Therapy:  ?Heparin level 0.3-0.7 units/ml ?aPTT 66-102 seconds ?Monitor platelets by anticoagulation protocol: Yes ?  ?Plan: ?3/6 @ 0212:  aPTT = 106 (SUPRAtherapeutic),  HL = 0.39 (therapeutic)  ?HL and aPTT still do not correlate, continue to use aPTT to dose heparin ?Will decrease heparin drip to 950 units/hr and recheck aPTT 8 hrs after rate change.  ?Will recheck HL on 3/7 with AM labs. ? ?Nijee Heatwole D, PharmD ?07/28/2021,2:43 AM ? ? ?

## 2021-07-29 ENCOUNTER — Telehealth: Payer: Self-pay | Admitting: Pharmacist

## 2021-07-29 ENCOUNTER — Other Ambulatory Visit (HOSPITAL_COMMUNITY): Payer: Self-pay

## 2021-07-29 DIAGNOSIS — I639 Cerebral infarction, unspecified: Secondary | ICD-10-CM | POA: Diagnosis not present

## 2021-07-29 DIAGNOSIS — R338 Other retention of urine: Secondary | ICD-10-CM | POA: Diagnosis not present

## 2021-07-29 DIAGNOSIS — C7931 Secondary malignant neoplasm of brain: Secondary | ICD-10-CM | POA: Diagnosis not present

## 2021-07-29 DIAGNOSIS — C3412 Malignant neoplasm of upper lobe, left bronchus or lung: Secondary | ICD-10-CM

## 2021-07-29 DIAGNOSIS — I6350 Cerebral infarction due to unspecified occlusion or stenosis of unspecified cerebral artery: Secondary | ICD-10-CM | POA: Diagnosis not present

## 2021-07-29 DIAGNOSIS — E1122 Type 2 diabetes mellitus with diabetic chronic kidney disease: Secondary | ICD-10-CM

## 2021-07-29 LAB — GLUCOSE, CAPILLARY
Glucose-Capillary: 162 mg/dL — ABNORMAL HIGH (ref 70–99)
Glucose-Capillary: 128 mg/dL — ABNORMAL HIGH (ref 70–99)
Glucose-Capillary: 111 mg/dL — ABNORMAL HIGH (ref 70–99)
Glucose-Capillary: 160 mg/dL — ABNORMAL HIGH (ref 70–99)

## 2021-07-29 LAB — CBC
HCT: 27.5 % — ABNORMAL LOW (ref 36.0–46.0)
Hemoglobin: 8.7 g/dL — ABNORMAL LOW (ref 12.0–15.0)
MCH: 27.7 pg (ref 26.0–34.0)
MCHC: 31.6 g/dL (ref 30.0–36.0)
MCV: 87.6 fL (ref 80.0–100.0)
Platelets: 274 10*3/uL (ref 150–400)
RBC: 3.14 MIL/uL — ABNORMAL LOW (ref 3.87–5.11)
RDW: 17.9 % — ABNORMAL HIGH (ref 11.5–15.5)
WBC: 13.9 10*3/uL — ABNORMAL HIGH (ref 4.0–10.5)
nRBC: 0.1 % (ref 0.0–0.2)

## 2021-07-29 LAB — COMPREHENSIVE METABOLIC PANEL
ALT: 46 U/L — ABNORMAL HIGH (ref 0–44)
AST: 112 U/L — ABNORMAL HIGH (ref 15–41)
Albumin: 2.3 g/dL — ABNORMAL LOW (ref 3.5–5.0)
Alkaline Phosphatase: 217 U/L — ABNORMAL HIGH (ref 38–126)
Anion gap: 8 (ref 5–15)
BUN: 20 mg/dL (ref 8–23)
CO2: 21 mmol/L — ABNORMAL LOW (ref 22–32)
Calcium: 8.4 mg/dL — ABNORMAL LOW (ref 8.9–10.3)
Chloride: 107 mmol/L (ref 98–111)
Creatinine, Ser: 1.02 mg/dL — ABNORMAL HIGH (ref 0.44–1.00)
GFR, Estimated: 55 mL/min — ABNORMAL LOW (ref >60)
Glucose, Bld: 106 mg/dL — ABNORMAL HIGH (ref 70–99)
Potassium: 4.3 mmol/L (ref 3.5–5.1)
Sodium: 136 mmol/L (ref 135–145)
Total Bilirubin: 0.7 mg/dL (ref 0.3–1.2)
Total Protein: 4.8 g/dL — ABNORMAL LOW (ref 6.5–8.1)

## 2021-07-29 LAB — URINALYSIS, COMPLETE (UACMP) WITH MICROSCOPIC
Bilirubin Urine: NEGATIVE
Glucose, UA: NEGATIVE mg/dL
Ketones, ur: NEGATIVE mg/dL
Nitrite: POSITIVE — AB
Protein, ur: 30 mg/dL — AB
RBC / HPF: >50 RBC/hpf — ABNORMAL HIGH (ref 0–5)
Specific Gravity, Urine: 1.016 (ref 1.005–1.030)
Squamous Epithelial / HPF: NONE SEEN (ref 0–5)
WBC, UA: >50 WBC/hpf — ABNORMAL HIGH (ref 0–5)
pH: 5 (ref 5.0–8.0)

## 2021-07-29 MED ORDER — SODIUM CHLORIDE 0.9 % IV SOLN
1.0000 g | INTRAVENOUS | Status: DC
  Administered 2021-07-29: 1 g via INTRAVENOUS
  Filled 2021-07-29: qty 1
  Filled 2021-07-29: qty 10

## 2021-07-29 MED ORDER — SODIUM CHLORIDE 0.9 % IV BOLUS
1000.0000 mL | Freq: Once | INTRAVENOUS | Status: AC
  Administered 2021-07-29: 1000 mL via INTRAVENOUS

## 2021-07-29 NOTE — Progress Notes (Signed)
PT Cancellation Note ? ?Patient Details ?Name: Natasha [name] ?MRN: [id number] ?DOB: November 14, 1940 ? ? ?Cancelled Treatment:    Reason Eval/Treat Not Completed: Fatigue/lethargy limiting ability to participate Pt spouse asking PT to come back at later time due Pt's increased fatigue this am. Will attempt at later time/date. ? ? ?Jonnie Kind, SPT ?07/29/2021, 11:27 AM ?

## 2021-07-29 NOTE — Telephone Encounter (Signed)
Oral Oncology Pharmacist Encounter ? ?Received new prescription for Tagrisso (osimertinib) for the treatment of metastatic NSCLC. Patient was found to have an uncommon EGFR mutation (EGFR L861Q) on [employer] testing, reported dated 10/31/20. MD is retesting patient and those results are pending. Planned duration until disease progression or unacceptable drug toxicity. ? ?CBC/CMP from 07/29/21 assessed, patient has a low hgb, continue to monitor with osimertinib initiation. ECHO from 07/17/21 found an EF of 60-65%. ECG from 07/17/21 found QTc of 461 msec ? ?Prescription dose and frequency assessed.  ? ?Current medication list in Epic reviewed, one DDIs with osimertinib identified: ?Amiodarone: QT-prolonging agents like amiodarone may enhance the QTc-prolonging effect of osimertinib. Recommend repeating ECG once patient is on combination therapy.  ?Rosuvastatin: Osimertinib may  increase the serum concentration of rosuvastatin. Monitor for increased rosuvastatin toxicities (eg, myalgias, rhabdomyolysis) ?Evaluated using current outpatient med list. Re-evaluated med rec at discharge if med changes are made. ? ?Evaluated chart and no patient barriers to medication adherence identified.  ? ?Oral Oncology Clinic will continue to follow for insurance authorization, copayment issues, initial counseling and start date. ? ? ?Darl Pikes, PharmD, BCPS, BCOP, CPP ?Hematology/Oncology Clinical Pharmacist Practitioner ?[facility] ?[phone] ? ?07/30/2021 9:55 AM ?

## 2021-07-29 NOTE — Progress Notes (Addendum)
PROGRESS NOTE  Natasha [name]    DOB: 1940/08/17, 81 y.o.  LSL:[id number]    Code Status: Full Code   DOA: 07/17/2021   LOS: 104   Brief hospital course  Natasha [name] is a 81 y.o. female with a PMH significant for A-fib on Eliquis, HTN, HLD, type II DM, COPD, asthma, GERD, depression, former smoker, RLS, OSA, CKD 4, CHF, H/o lung cancer s/p radiation therapy, IDA, chronic pain. They presented from home to the ED on 07/17/2021 with dizziness upon waking up on 2/23 and was her normal self when went to bed that previous night. She had worsening of the symptoms and included left leg weakness.  Of note, pt is typically on chronic anticoagulation via Eliquis, however, this medication was held for scheduled right-sided carpal tunnel release, which occurred on 07/15/2021. In the ED, it was found that they had 2 small right cerebellar infarcts on head CT.  This was followed by brain MRI which showed numerous small acute infarcts scattered in the brain and compatible with central embolic disease.  Additionally, this imaging showed multiple bone lesions that were not previously seen on recent head imaging in 02/2021 and are consistent with metastatic disease. Neurology was consulted for further stroke evaluation and treatment.  They were treated with continued holding of Eliquis, permissive hypertension, PT/OT consult.  2/27- lymph node biopsy 2/28- patient had recurrent stroke symptoms and new lesions seen on repeat head CT.  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 3/3- repeat head CT shows expected progression of CVA lesions without new or hemorrhagic conversion. Started on IV heparin gtt without bolus 3/6- patient discharged to SNF but bed offer was revoked due to presumed cost of her oncology therapy  07/29/21 -on initial exam, patient appears more lethargic than typical. She is denying pain. Answering questions appropriately. Tubing from foley  catheter has standing urine near exit from body which has sediment. Asked nurse to remove foley for voiding trial today and collect urine for urinalysis. Showed positive nitrites, hgb, protein, leukocytes, WBC, bacteria. Initiated IV CTX  Assessment & Plan  Principal Problem:   Acute embolic stroke [name]) Active Problems:   Essential hypertension   Iron deficiency anemia   OSA (obstructive sleep apnea)   Paroxysmal atrial fibrillation (HCC)   Restless leg syndrome   CKD (chronic kidney disease) stage 4, GFR 15-29 ml/min (HCC)   HLD (hyperlipidemia)   Type II diabetes mellitus with renal manifestations (HCC)   COPD (chronic obstructive pulmonary disease) (HCC)   Chronic diastolic CHF (congestive heart failure) (HCC)   Leukocytosis   Bone lesion   Lung cancer metastatic to brain [name]   Carpal tunnel syndrome on right   Lesion of liver   CVA (cerebral vascular accident) ([name])   Anemia   Thrombocytopenia (HCC)   Pressure injury of skin   Posterior circulation stroke ([name])   Brain metastases ([name])   Acute urinary retention   Metastatic cancer to bone [name]  CVA-present on admission. recurrence of new lesions and symptoms 2/28.  Pattern is significant for embolic disease in setting of held anticoagulations for recent surgery. Patient denies any new symptoms since 2/28. Repeat head CT 3/3 to monitor progression of the lesions was negative for hemorrhage or mass effect or new lesions. Denies any new symptoms. Tolerating well. - initiated on heparin gtt over the weekend - restarted eliquis 3/6 -Neurology following, appreciate recommendations -PT/OT/SLP -Stat head CT for any new emerging symptoms  New incidental finding of  bone lesions seen on head/C-spine imaging suspicious of metastatic disease- history of lung cancer s/p radiation therapy.  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 According to most recent heme/onc notes/discussions, there  is not a definitive plan of treatment set yet until complete staining has resulted.  - heme/onc following, appreciate recommendations  - f/u outpatient  Catheter associated UTI   acute urinary retention- increased lethargy today. Catheter in place due to acute urinary retention during this admission. Urinalysis almost pan-positive for signs of infection.  - remove foley - bladder scans - strict I/O - initiate CTX - fluid bolus - urology follow up OP  HTN-permissive hypertension, patient's Bps have had low diastolics so no antihypertensives added and discontinued opioid pain medications. -Titrate back on home blood pressure medications as able or indicated  PAF-Eliquis was restarted on 2/28 after being held for her recent carpal tunnel release surgery. Held when new stroke found and then restarted again 3/6. Rate controlled. -Continue amiodarone - continue eliquis at $RemoveBe'5mg'PAtvBsRiK$  BID  AKI (resolved) on CKD 4-gradual improvement Cr 2.2>2.08>1.56>1.32. appears to be close to baseline. -monitor  Type II DM   HLD- moderately well controlled. Hgb A1c goal <8 -Continue statin -Continue sliding scale -Continue Semglee 15 units daily  COPD-chronic, stable. ORA -As needed bronchodilators  HFpEF-holding diuretics  Restless leg syndrome -Continue home medication  OSA -Continue CPAP nightly  IDA   thrombocytopenia- platelets decreased and likely reactionary. Have been stable and improving while on heparin gtt  131>>>>70>86>134>210>244 - CBC am -Continue iron supplement  Carpal tunnel syndrome on right s/p release on 2/21. Ortho evaluated and removed sutures 3/2. Endorses feeling improvement - PT/OT  Depression- chronic, stable -Continue home sertraline  Body mass index is 25.75 kg/m.  VTE ppx: Place and maintain sequential compression device Start: 07/18/21 0658 SCDs Start: 07/17/21 0525 apixaban (ELIQUIS) tablet 5 mg   Diet:     Diet   Diet Carb Modified Fluid consistency: Thin;  Room service appropriate? Yes with Assist   Subjective 07/29/21    Pt reports no complaints this morning. Denies pain. Although she appears very tired and is less interactive with conversation than her baseline.   Objective   Vitals:   07/28/21 0807 07/28/21 1128 07/28/21 2042 07/29/21 0620  BP: (!) 131/57 (!) 127/56 (!) 148/62 (!) 142/65  Pulse: 84 82 93 95  Resp: $Remo'18 18 16 16  'FzSKC$ Temp: 98.4 F (36.9 C) 97.7 F (36.5 C) 98.9 F (37.2 C) 98.8 F (37.1 C)  TempSrc: Oral Oral    SpO2: 97% 96% 97% 93%  Weight:      Height:        Intake/Output Summary (Last 24 hours) at 07/29/2021 0705 Last data filed at 07/29/2021 5643 Gross per 24 hour  Intake 480 ml  Output 600 ml  Net -120 ml    Filed Weights   07/26/21 0500 07/27/21 0300 07/28/21 0500  Weight: 64.4 kg 64.4 kg 68 kg     Physical Exam:  General: awake, alert, NAD, lethargic HEENT: atraumatic, clear conjunctiva, anicteric sclera, MMM, hearing grossly normal Respiratory: normal respiratory effort. CTAB Cardiovascular: quick capillary refill, RRR Nervous: A&O x3. Moving all extremities, normal speech Extremities: moves all equally, no edema, normal tone Skin: dry, intact, normal temperature, pale. No rashes, lesions or ulcers on exposed skin. Ecchymosis on bilateral hands/forearms.  Psychiatry: flat mood, congruent affect  Labs   I have personally reviewed the following labs and imaging studies CBC    Component  Value Date/Time   WBC 14.9 (H) 07/28/2021 0212   RBC 3.05 (L) 07/28/2021 0212   HGB 8.5 (L) 07/28/2021 0212   HCT 26.3 (L) 07/28/2021 0212   PLT 244 07/28/2021 0212   MCV 86.2 07/28/2021 0212   MCH 27.9 07/28/2021 0212   MCHC 32.3 07/28/2021 0212   RDW 17.7 (H) 07/28/2021 0212   LYMPHSABS 1.3 06/16/2021 1039   MONOABS 0.6 06/16/2021 1039   EOSABS 0.1 06/16/2021 1039   BASOSABS 0.0 06/16/2021 1039   BMP Latest Ref Rng & Units 07/28/2021 07/26/2021 07/24/2021  Glucose 70 - 99 mg/dL 160(H) 194(H) 224(H)  BUN 8 -  23 mg/dL 24(H) 38(H) 60(H)  Creatinine 0.44 - 1.00 mg/dL 1.12(H) 1.32(H) 1.56(H)  BUN/Creat Ratio 6 - 22 (calc) - - -  Sodium 135 - 145 mmol/L 134(L) 137 135  Potassium 3.5 - 5.1 mmol/L 4.1 3.8 3.9  Chloride 98 - 111 mmol/L 108 109 107  CO2 22 - 32 mmol/L 22 20(L) 19(L)  Calcium 8.9 - 10.3 mg/dL 8.2(L) 8.1(L) 8.2(L)   No results found.   Disposition Plan & Communication  Patient status: Inpatient  Admitted From: Home Planned disposition location: Skilled nursing facility Anticipated discharge date: 3/8 pending no new stroke symptoms and tolerating anticoagulation, neuro clearance, and not needing oncology inpatient workup. Now needing to treat and improve from UTI  Family Communication: husband at bedside   Author: Richarda Osmond, DO [facility] 07/29/2021, 7:05 AM   Available by Epic secure chat 7AM-7PM. If 7PM-7AM, please contact night-coverage.  [facility] contact information found on [email or web address].

## 2021-07-29 NOTE — Progress Notes (Signed)
Physical Therapy Treatment ?Patient Details ?Name: Natasha [name] ?MRN: [id number] ?DOB: 1941-04-23 ?Today's Date: 07/29/2021 ? ? ?History of Present Illness 81 y.o. female with history of atrial fibrillation on Eliquis, CHF, hypertension, hyperlipidemia, diabetes, COPD, chronic kidney disease, lung CA, depression, and recent R carpal tunnel release (Jul 15, 2021), who presents to the emergency department with complaints of vertigo. 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 ? ?  ?PT Comments  ? ? Pt is awake and alert resting in bed w/ husband at bedside, upon PT entrance into room for session today. Pt denies c/o pain at rest, but notable grimacing and moaning w/ movement for bed mobility/transfers. Prior to bed mobility, Pt was able to complete LE exercises supine in bed (see "General LE Exercise" section). She is able to perform supine to sit bed mobility w/ modA needed for trunk elevation and LE transfer. Once seated EOB c/o dizziness and requesting to lay back down; maxA required for sit to supine transfer. Upon lying back down, dizziness subsides. Per Pt's husband, Pt has not eaten her breakfast or lunch today and has not been eating hardly at all during hospitalization. Pt will benefit from continued skilled PT in order to increase LE strength, mobility, gait, and restore PLOF. Current discharge recommendation remains appropriate due to the level of assistance required by the patient to ensure safety and improve overall function. ?   ?Recommendations for follow up therapy are one component of a multi-disciplinary discharge planning process, led by the attending physician.  Recommendations may be updated based on patient status, additional functional criteria and insurance authorization. ? ?Follow Up Recommendations ? Skilled nursing-short term rehab (<3 hours/day) ?   ?  ?Assistance Recommended at Discharge Frequent or constant Supervision/Assistance  ?Patient can return home with the following Help with stairs or ramp for entrance;Assist for transportation;Assistance with cooking/housework;Two people to help with walking and/or transfers;Direct supervision/assist for medications management;Direct supervision/assist for financial management;Two people to help with bathing/dressing/bathroom ?  ?Equipment Recommendations ? Rolling walker (2 wheels)  ?  ?Recommendations for Other Services   ? ? ?  ?Precautions / Restrictions Precautions ?Precautions: Fall ?Precaution Comments: recent carpal tunnel sx R UE ?Restrictions ?Weight Bearing Restrictions: No  ?  ? ?Mobility ? Bed Mobility ?Overal bed mobility: Needs Assistance ?Bed Mobility: Supine to Sit, Sit to Supine ?  ?  ?Supine to sit: Mod assist ?Sit to supine: Max assist ?  ?  ?  ? ?Transfers ?  ?  ?  ?  ?  ?  ?  ?  ?  ?  ?  ? ?Ambulation/Gait ?  ?  ?  ?  ?  ?  ?  ?  ? ? ?Stairs ?  ?  ?  ?  ?  ? ? ?Wheelchair Mobility ?  ? ?Modified Rankin (Stroke Patients Only) ?  ? ? ?  ?Balance Overall balance assessment: Needs assistance ?Sitting-balance support: Bilateral upper extremity supported ?Sitting balance-Leahy Scale: Fair ?Sitting balance - Comments: sat EOB <~30sec c/o dizziness ?  ?  ?  ?  ?  ?  ?  ?  ?  ?  ?  ?  ?  ?  ?  ?  ? ?  ?Cognition Arousal/Alertness: Awake/alert ?Behavior During Therapy: [facility] for tasks assessed/performed ?Overall Cognitive Status: Within Functional Limits for tasks assessed ?  ?  ?  ?  ?  ?  ?  ?  ?  ?  ?  ?  ?  ?  ?  ?  ?  General Comments: follows commands with increased time ?  ?  ? ?  ?Exercises General Exercises - Lower Extremity ?Ankle Circles/Pumps: AROM, 15 reps, Both ?Heel Slides: AROM, Both, 10 reps ?Hip ABduction/ADduction: AROM, Both, 10 reps ?Straight Leg Raises: AROM, Both, 10 reps ? ?  ?General Comments   ?  ?  ? ?Pertinent Vitals/Pain Pain Assessment ?Pain Assessment: Faces ?Faces Pain Scale:  Hurts even more ?Pain Location: sacral wound and shoulders ?Pain Descriptors / Indicators: Discomfort, Grimacing ?Pain Intervention(s): Limited activity within patient's tolerance, Monitored during session  ? ? ?Home Living   ?  ?  ?  ?  ?  ?  ?  ?  ?  ?   ?  ?Prior Function    ?  ?  ?   ? ?PT Goals (current goals can now be found in the care plan section) Progress towards PT goals: Not progressing toward goals - comment (limited progress) ? ?  ?Frequency ? ? ? Min 2X/week ? ? ? ?  ?PT Plan Current plan remains appropriate  ? ? ?Co-evaluation   ?  ?  ?  ?  ? ?  ?AM-PAC PT "6 Clicks" Mobility   ?Outcome Measure ? Help needed turning from your back to your side while in a flat bed without using bedrails?: A Lot ?Help needed moving from lying on your back to sitting on the side of a flat bed without using bedrails?: A Lot ?Help needed moving to and from a bed to a chair (including a wheelchair)?: Total ?Help needed standing up from a chair using your arms (e.g., wheelchair or bedside chair)?: Total ?Help needed to walk in hospital room?: Total ?Help needed climbing 3-5 steps with a railing? : Total ?6 Click Score: 8 ? ?  ?End of Session   ?Activity Tolerance: Other (comment) (Patient limited by dizziness; has not been eating) ?Patient left: in bed;with call bell/phone within reach;with bed alarm set;with family/visitor present ?Nurse Communication: Mobility status ?PT Visit Diagnosis: Unsteadiness on feet (R26.81);Other abnormalities of gait and mobility (R26.89);Difficulty in walking, not elsewhere classified (R26.2) ?  ? ? ?Time: 4854-6270 ?PT Time Calculation (min) (ACUTE ONLY): 16 min ? ?Charges:             ?          ? ? ?Jonnie Kind, SPT ?07/29/2021, 2:04 PM ? ?

## 2021-07-29 NOTE — Progress Notes (Signed)
Nutrition Follow-up ? ?DOCUMENTATION CODES:  ? ?Not applicable ? ?INTERVENTION:  ? ?-D/c Glucerna shake ?-Continue MVI with minerals ?-Continue 500 mg vitamin C BID ?-Continue 220 mg zinc sulfate daily x 14 days ?-Mighty Shake TID with meals, each supplement provides 220 kcals and 6 grams protein ? ?NUTRITION DIAGNOSIS:  ? ?Increased nutrient needs related to wound healing as evidenced by estimated needs. ? ?Ongoing ? ?GOAL:  ? ?Patient will meet greater than or equal to 90% of their needs ? ?Progressing  ? ?MONITOR:  ? ?PO intake, Supplement acceptance, Labs, Weight trends, Skin, I & O's ? ?REASON FOR ASSESSMENT:  ? ?Malnutrition Screening Tool ?  ? ?ASSESSMENT:  ? ?Natasha [name] is a 81 y.o. female with a PMH significant for A-fib on Eliquis, HTN, HLD, type II DM, COPD, asthma, GERD, depression, former smoker, RLS, OSA, CKD 4, CHF, H/o lung cancer s/p radiation therapy, IDA, chronic pain. ? ?2/27- lymph node biopsy ?2/28- patient had recurrent stroke symptoms and new lesions seen on repeat head CT.  ?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 ? ?Reviewed I/O's: -120 ml x 24 hours and -7.1 L since admission ? ?UOP: 600 ml x 24 hours ? ?Pt unavailable at time of visit. Attempted to speak with pt via call to hospital room phone, however, unable to reach. ? ?Pt with fair oral intake. Noted meal completions 50%. Pt is refusing Glucerna supplements.  ? ?Per RN, pt very lethargic this morning, but able to take priority medications.  ? ?Per MD notes, pt is medically stable for discharge but awaiting SNF placement.  ? ?Medications reviewed and include vitamin C, calcium with vitamin D, miralax, and zinc sulfate.  ? ?Labs reviewed: CBGS: 128-162 (inpatient orders for glycemic control are 0-9 units insulin aspart TID with meals and 15 units insulin glargine-yfgn daily).   ? ?Diet Order:   ?Diet Order   ? ?       ?  Diet Carb Modified Fluid consistency: Thin; Room service  appropriate? Yes with Assist  Diet effective now       ?  ? ?  ?  ? ?  ? ? ?EDUCATION NEEDS:  ? ?No education needs have been identified at this time ? ?Skin:  Skin Assessment: Skin Integrity Issues: ?Skin Integrity Issues:: Incisions, Stage II, DTI ?DTI: buttocks ?Stage II: coccyx ?Incisions: closed neck rt arm ? ?Last BM:  07/23/21 ? ?Height:  ? ?Ht Readings from Last 1 Encounters:  ?07/17/21 $RemoveBe'5\' 4"'ntizeALaQ$  (1.626 m)  ? ? ?Weight:  ? ?Wt Readings from Last 1 Encounters:  ?07/28/21 68 kg  ? ? ?Ideal Body Weight:  54.5 kg ? ?BMI:  Body mass index is 25.75 kg/m?. ? ?Estimated Nutritional Needs:  ? ?Kcal:  1650-1850 ? ?Protein:  85-100 grams ? ?Fluid:  > 1.6 L ? ? ? ?Loistine Chance, RD, LDN, CDCES ?Registered Dietitian II ?Certified Diabetes Care and Education Specialist ?Please refer to [facility] for RD and/or RD on-call/weekend/after hours pager  ?

## 2021-07-29 NOTE — Discharge Summary (Incomplete)
Physician Discharge Summary  Patient: Natasha [name] YCI:[id number] DOB: 1940-07-10   Code Status: Full Code Admit date: 07/17/2021 Discharge date: 07/29/2021 Disposition: Skilled nursing facility,  PCP: Lynnda Child, MD  Recommendations for Outpatient Follow-up:  Follow up with PCP within 1-2 weeks Monitor blood pressure. Held home blood pressure medications while inpatient due to soft Bps and to allow for permissive HTN following stroke. Recommend restarting as needed for BP at goal for age Follow up with neurology Restarted epiquis on day of discharge Follow up with ortho Monitor carpel tunnel release surgery recovery Follow up with heme/onc Discuss treatment options in regard to findings while inpatient Head CT consistent with metastatic dz, lymph node biopsy consistent with adenocarcinoma lung primary Follow up with urology- patient had acute urinary retention in setting of acute stroke and failed voiding trial. She will be discharged with a foley in place.  Discharge Diagnoses:  Principal Problem:   Acute embolic stroke [facility]) Active Problems:   Essential hypertension   Iron deficiency anemia   OSA (obstructive sleep apnea)   Paroxysmal atrial fibrillation (HCC)   Restless leg syndrome   CKD (chronic kidney disease) stage 4, GFR 15-29 ml/min (HCC)   HLD (hyperlipidemia)   Type II diabetes mellitus with renal manifestations (HCC)   COPD (chronic obstructive pulmonary disease) (HCC)   Chronic diastolic CHF (congestive heart failure) (HCC)   Leukocytosis   Bone lesion   Lung cancer metastatic to brain [facility])   Carpal tunnel syndrome on right   Lesion of liver   CVA (cerebral vascular accident) (HCC)   Anemia   Thrombocytopenia (HCC)   Pressure injury of skin   Posterior circulation stroke (HCC)   Brain metastases (HCC)   Acute urinary retention   Metastatic cancer to bone [facility])  Brief Hospital Course Summary: Natasha [name] is a 81 y.o. female with a PMH significant  for A-fib on Eliquis, HTN, HLD, type II DM, COPD, asthma, GERD, depression, former smoker, RLS, OSA, CKD 4, CHF, H/o lung cancer s/p radiation therapy, IDA, chronic pain. They presented from home to the ED on 07/17/2021 with dizziness upon waking up on 2/23 and was her normal self when went to bed that previous night. She had worsening of the symptoms and included left leg weakness.  Of note, pt is typically on chronic anticoagulation via Eliquis, however, this medication was held for scheduled right-sided carpal tunnel release, which occurred on 07/15/2021. In the ED, it was found that they had 2 small right cerebellar infarcts on head CT. This was followed by brain MRI which showed numerous small acute infarcts scattered in the brain and compatible with central embolic disease.   Additionally, this stroke work up imaging showed multiple bone lesions that were not previously seen on recent head imaging in 02/2021 and are consistent with metastatic disease.  Neurology was consulted for further stroke evaluation and treatment. Heme/onc was consulted for further workup and management of new malignancy.   They were treated with continued holding of Eliquis, permissive hypertension, PT/OT consult. Her symptoms showed improvement and she appeared to be stable.   2/27- cervical lymph node biopsy to diagnose primary malignancy   2/28- patient had recurrent stroke symptoms and repeat head CT showed new acute lesions. Again, patient showed good recovery from her symptoms. PT/OT worked with her and recommended SNF at dc to help with recovery.    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 Heme onc recommended outpatient f/u.  3/2-  ortho removed sutures from carpel tunnel release   3/3- repeat head CT shows expected progression of CVA lesions without new or hemorrhagic conversion. Started on IV heparin gtt without bolus  3/6- heparin gtt discontinued and  patient restarted on home eliquis prior to dc  Discharge Condition: Good, improved Recommended discharge diet: Regular healthy diet  Consultations: Neurology Heme/onc  Radiology  Ortho   Procedures/Studies: Lymph node biopsy Brain MRI Foley in place for acute urinary retention  Discharge Instructions     Ambulatory referral to Urology   Complete by: As directed       Allergies as of 07/29/2021       Reactions   Atorvastatin    Muscle/joint aches   Lantus [insulin Glargine] Hives   Lyrica [pregabalin] Other (See Comments)   Headache, disorientation   Lisinopril Hives, Cough           Medication List     STOP taking these medications    amoxicillin 500 MG capsule Commonly known as: AMOXIL   Breztri Aerosphere 160-9-4.8 MCG/ACT Aero Generic drug: Budeson-Glycopyrrol-Formoterol   chlorhexidine 0.12 % solution Commonly known as: PERIDEX   HYDROcodone-acetaminophen 7.5-325 MG tablet Commonly known as: Norco   metoprolol succinate 25 MG 24 hr tablet Commonly known as: TOPROL-XL   pantoprazole 40 MG tablet Commonly known as: PROTONIX   spironolactone 25 MG tablet Commonly known as: ALDACTONE       TAKE these medications    acetaminophen 325 MG tablet Commonly known as: TYLENOL Take 2 tablets (650 mg total) by mouth every 6 (six) hours as needed for mild pain or fever.   amiodarone 200 MG tablet Commonly known as: PACERONE Take 400 mg by mouth daily.   apixaban 5 MG Tabs tablet Commonly known as: Eliquis Take 1 tablet (5 mg total) by mouth 2 (two) times daily.   CALCIUM 600+D3 PO Take 1 tablet by mouth in the morning.   Contour Next Test test strip Generic drug: glucose blood Check sugar twice daily DX E11.69   gabapentin 100 MG capsule Commonly known as: NEURONTIN Take 1 capsule (100 mg total) by mouth 3 (three) times daily as needed. Take one capsule by mouth nightly for one week. Then increase to 2 capsules nightly for one week. Then 3  capsules nightly. What changed:  how much to take when to take this reasons to take this   IRON 27 PO Take 27 mg by mouth in the morning.   Janumet 50-1000 MG tablet Generic drug: sitaGLIPtin-metformin Take 2 tablets by mouth in the morning.   Jardiance 25 MG Tabs tablet Generic drug: empagliflozin Take 25 mg by mouth daily.   levalbuterol 45 MCG/ACT inhaler Commonly known as: XOPENEX HFA Inhale 2 puffs into the lungs every 8 (eight) hours as needed for wheezing or shortness of breath (cough).   polyethylene glycol 17 g packet Commonly known as: MIRALAX / GLYCOLAX Take 17 g by mouth daily.   pramipexole 1 MG tablet Commonly known as: MIRAPEX Take 1 tablet (1 mg total) by mouth at bedtime. Appt with PCP needed for further refills.   rosuvastatin 10 MG tablet Commonly known as: CRESTOR Take 1 tablet (10 mg total) by mouth at bedtime. What changed:  medication strength how much to take   sertraline 50 MG tablet Commonly known as: ZOLOFT Take 1 tablet (50 mg total) by mouth at bedtime.   Tyler Aas FlexTouch 100 UNIT/ML FlexTouch Pen Generic drug: insulin degludec Inject 15 Units into the skin daily. What changed:  when to take this   Vitamin D (Ergocalciferol) 1.25 MG (50000 UNIT) Caps capsule Commonly known as: DRISDOL Take 1 capsule (50,000 Units total) by mouth every 7 (seven) days.        Subjective   Pt reports doing well overall. She is disappointed that she is not going straight home. No specific complaints today. Husband at bedside has questions about discharge which were addressed at time of encounter. Objective  Blood pressure (!) 148/56, pulse 88, temperature 98.2 F (36.8 C), temperature source Oral, resp. rate 18, height $RemoveBe'5\' 4"'ObJbTQlLl$  (1.626 m), weight 68 kg, SpO2 96 %.   General: Pt is alert, awake, not in acute distress Cardiovascular: RRR, S1/S2 +, no rubs, no gallops Respiratory: CTA bilaterally, no wheezing, no rhonchi Abdominal: Soft, NT, ND, bowel  sounds + Extremities: no edema, no cyanosis   The results of significant diagnostics from this hospitalization (including imaging, microbiology, ancillary and laboratory) are listed below for reference.   Imaging studies: CT HEAD WO CONTRAST (5MM)  Result Date: 07/25/2021 CLINICAL DATA:  81 year old female with recent neurologic deficit and embolic appearing infarcts scattered throughout the brain on 07/22/2021. EXAM: CT HEAD WITHOUT CONTRAST TECHNIQUE: Contiguous axial images were obtained from the base of the skull through the vertex without intravenous contrast. RADIATION DOSE REDUCTION: This exam was performed according to the departmental dose-optimization program which includes automated exposure control, adjustment of the mA and/or kV according to patient size and/or use of iterative reconstruction technique. COMPARISON:  Brain MRI 07/22/2021.  Head CT 07/17/2021. FINDINGS: Brain: Development of cytotoxic edema corresponding to the DWI positive infarcts on the recent MRI, most conspicuous in the left occipital pole (series 2, image 12), right centrum semiovale (image 18), and cerebellum (image 9). No associated hemorrhage. No mass effect. Stable gray-white matter differentiation otherwise. No ventriculomegaly. Normal basilar cisterns. Vascular: Calcified atherosclerosis at the skull base. No suspicious intracranial vascular hyperdensity. Skull: Osteopenia.  No acute osseous abnormality identified. Sinuses/Orbits: Trace fluid layering in the left sphenoid sinus. Other Visualized paranasal sinuses and mastoids are clear. Other: No acute orbit or scalp soft tissue finding. IMPRESSION: 1. Expected CT appearance of the scattered bilateral infarcts on 07/22/2021 MRI. No associated hemorrhage or mass effect. 2. No new intracranial abnormality. Electronically Signed   By: Genevie Ann M.D.   On: 07/25/2021 08:56   CT Head Wo Contrast  Result Date: 07/17/2021 CLINICAL DATA:  Nonspecific dizziness EXAM: CT HEAD  WITHOUT CONTRAST TECHNIQUE: Contiguous axial images were obtained from the base of the skull through the vertex without intravenous contrast. RADIATION DOSE REDUCTION: This exam was performed according to the departmental dose-optimization program which includes automated exposure control, adjustment of the mA and/or kV according to patient size and/or use of iterative reconstruction technique. COMPARISON:  03/09/2021 FINDINGS: Brain: 2 small right cerebellar infarcts not seen on prior, possibly recent and symptomatic. Few remote supratentorial white matter insults. No hemorrhage, hydrocephalus, or collection. Age normal brain volume. Vascular: No hyperdense vessel or unexpected calcification. Skull: Normal. Negative for fracture or focal lesion. Sinuses/Orbits: No acute finding. 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 Electronically Signed   By: Jorje Guild M.D.   On: 07/17/2021 04:32   MR ANGIO HEAD WO CONTRAST  Result Date: 07/17/2021 CLINICAL DATA:  Dizziness, infarcts on MRI EXAM: MRA NECK WITHOUT CONTRAST MRA HEAD WITHOUT CONTRAST TECHNIQUE: Angiographic images of the Circle of Willis were acquired using MRA technique without intravenous contrast. COMPARISON:  None.  FINDINGS: MRA NECK FINDINGS Standard aortic branching. Common, internal, and external carotid arteries are patent, without hemodynamically significant stenosis. Extracranial vertebral arteries are patent, without hemodynamically significant stenosis, although imaging of the origins is somewhat limited by artifact. MRA HEAD FINDINGS Both internal carotid arteries are patent to the termini, without significant stenosis. A1 segments patent. Normal anterior communicating artery. Anterior cerebral arteries are patent to their distal aspects. No M1 stenosis or occlusion. Normal MCA bifurcations. Distal MCA branches perfused and symmetric. Vertebral arteries patent to the  vertebrobasilar junction without stenosis. Basilar patent to its distal aspect. Superior cerebellar arteries patent bilaterally. Patent P1 segments, diminutive on the right. Near fetal origin of the right PCA with patent right posterior communicating artery. PCAs perfused to their distal aspects without stenosis. Possible diminutive left posterior communicating artery. IMPRESSION: 1.  No intracranial large vessel occlusion or significant stenosis. 2.  No hemodynamically significant stenosis in the neck. Electronically Signed   By: Merilyn Baba M.D.   On: 07/17/2021 23:08   MR ANGIO NECK WO CONTRAST  Result Date: 07/17/2021 CLINICAL DATA:  Dizziness, infarcts on MRI EXAM: MRA NECK WITHOUT CONTRAST MRA HEAD WITHOUT CONTRAST TECHNIQUE: Angiographic images of the Circle of Willis were acquired using MRA technique without intravenous contrast. COMPARISON:  None. FINDINGS: MRA NECK FINDINGS Standard aortic branching. Common, internal, and external carotid arteries are patent, without hemodynamically significant stenosis. Extracranial vertebral arteries are patent, without hemodynamically significant stenosis, although imaging of the origins is somewhat limited by artifact. MRA HEAD FINDINGS Both internal carotid arteries are patent to the termini, without significant stenosis. A1 segments patent. Normal anterior communicating artery. Anterior cerebral arteries are patent to their distal aspects. No M1 stenosis or occlusion. Normal MCA bifurcations. Distal MCA branches perfused and symmetric. Vertebral arteries patent to the vertebrobasilar junction without stenosis. Basilar patent to its distal aspect. Superior cerebellar arteries patent bilaterally. Patent P1 segments, diminutive on the right. Near fetal origin of the right PCA with patent right posterior communicating artery. PCAs perfused to their distal aspects without stenosis. Possible diminutive left posterior communicating artery. IMPRESSION: 1.  No  intracranial large vessel occlusion or significant stenosis. 2.  No hemodynamically significant stenosis in the neck. Electronically Signed   By: Merilyn Baba M.D.   On: 07/17/2021 23:08   MR BRAIN WO CONTRAST  Result Date: 07/22/2021 CLINICAL DATA:  Acute neuro deficit. Stroke. Now with bilateral leg weakness and diplopia. EXAM: MRI HEAD WITHOUT CONTRAST TECHNIQUE: Multiplanar, multiecho pulse sequences of the brain and surrounding structures were obtained without intravenous contrast. COMPARISON:  MRI head 07/17/2021 FINDINGS: Brain: Numerous areas of acute infarct are present compatible with emboli. Multiple small infarcts in the cerebellum bilaterally are stable. Progressive acute infarct in the left occipital pole with mild associated petechial hemorrhage which was seen previously. Multiple small areas of acute infarct in the frontal and parietal lobes bilaterally and in the white matter. Progression of cluster of acute infarcts in the right parietal white matter and also in the right medial parietal cortex. Small acute infarct head of caudate on the left unchanged. Small acute infarct head of caudate on the right is new. Ventricle size normal.  No mass or midline shift. Vascular: Normal arterial flow voids. Skull and upper cervical spine: Bilateral calvarial lesions are again noted. These are suspicious for metastatic disease. Sinuses/Orbits: Paranasal sinuses clear. Bilateral cataract extraction Other: None IMPRESSION: Numerous areas of acute infarct in the cerebrum and cerebellum bilaterally compatible with acute embolic infarction. There has been progression of acute  infarcts since the recent MRI of 07/17/2021. Findings suggest recurrent emboli. Progression of infarct left occipital pole. Petechial hemorrhage in this area unchanged from the prior study. No other hemorrhage. Lesions in the calvarium bilaterally, suspicious for metastatic disease. Electronically Signed   By: Franchot Gallo M.D.   On:  07/22/2021 14:59   MR BRAIN WO CONTRAST  Result Date: 07/17/2021 CLINICAL DATA:  Nonspecific dizziness. EXAM: MRI HEAD WITHOUT CONTRAST TECHNIQUE: Multiplanar, multiecho pulse sequences of the brain and surrounding structures were obtained without intravenous contrast. COMPARISON:  Head CT from earlier today FINDINGS: Brain: Patchy acute infarcts in the bilateral cerebellum and bilateral frontal, parietal, and occipital convexities. Patchy acute infarct in the right more than left centrum semiovale and in the left caudate head. Mild petechial hemorrhage at the right occipital cortex. No hematoma, hydrocephalus, or collection. Vascular: Normal flow voids Skull and upper cervical spine: New scattered bone lesions in the C2 right articular process, right para median clivus tip, and in the bilateral calvarium, affected areas marked on sagittal T2 weighted imaging. Sinuses/Orbits: Negative IMPRESSION: 1. Numerous small acute infarcts scattered in the brain and compatible with central embolic disease. 2. 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 Recommend metastatic workup. Electronically Signed   By: Jorje Guild M.D.   On: 07/17/2021 05:42   ECHOCARDIOGRAM COMPLETE  Result Date: 07/17/2021    ECHOCARDIOGRAM REPORT   Patient Name:   [facility] Date of Exam: 07/17/2021 Medical Rec #:  [id number]     Height:       64.0 in Accession #:    [id number]    Weight:       137.0 lb Date of Birth:  [birth year]     BSA:          1.666 m Patient Age:    49 years      BP:           131/66 mmHg Patient Gender: F             HR:           67 bpm. Exam Location:  [facility] Procedure: 2D Echo, Cardiac Doppler and Color Doppler Indications:     Stroke I63.9  History:         Patient has prior history of Echocardiogram examinations, most                  recent 02/25/2021. COPD, Arrythmias:Atrial Fibrillation;                  Signs/Symptoms:Murmur.  Sonographer:     Sherrie Sport Referring Phys:  [id number] Ivor Costa  Diagnosing Phys: Ida Rogue MD  Sonographer Comments: No parasternal window and suboptimal apical window. Image acquisition challenging due to COPD. IMPRESSIONS  1. Left ventricular ejection fraction, by estimation, is 60 to 65%. The left ventricle has normal function. The left ventricle has no regional wall motion abnormalities. Left ventricular diastolic parameters are consistent with Grade I diastolic dysfunction (impaired relaxation).  2. Right ventricular systolic function is normal. The right ventricular size is normal. There is mildly elevated pulmonary artery systolic pressure. The estimated right ventricular systolic pressure is 97.0 mmHg.  3. The mitral valve is normal in structure. No evidence of mitral valve regurgitation. No evidence of mitral stenosis.  4. The aortic valve is normal in structure. Aortic valve regurgitation is mild to moderate. No aortic stenosis is present.  5. The inferior vena cava is normal in size with greater than  50% respiratory variability, suggesting right atrial pressure of 3 mmHg. FINDINGS  Left Ventricle: Left ventricular ejection fraction, by estimation, is 60 to 65%. The left ventricle has normal function. The left ventricle has no regional wall motion abnormalities. The left ventricular internal cavity size was normal in size. There is  no left ventricular hypertrophy. Left ventricular diastolic parameters are consistent with Grade I diastolic dysfunction (impaired relaxation). Right Ventricle: The right ventricular size is normal. No increase in right ventricular wall thickness. Right ventricular systolic function is normal. There is mildly elevated pulmonary artery systolic pressure. The tricuspid regurgitant velocity is 3.00  m/s, and with an assumed right atrial pressure of 5 mmHg, the estimated right ventricular systolic pressure is 60.0 mmHg. Left Atrium: Left atrial size was normal in size. Right Atrium: Right atrial size was normal in size. Pericardium: There  is no evidence of pericardial effusion. Mitral Valve: The mitral valve is normal in structure. Mild mitral annular calcification. No evidence of mitral valve regurgitation. No evidence of mitral valve stenosis. MV peak gradient, 5.9 mmHg. The mean mitral valve gradient is 2.0 mmHg. Tricuspid Valve: The tricuspid valve is normal in structure. Tricuspid valve regurgitation is not demonstrated. No evidence of tricuspid stenosis. Aortic Valve: The aortic valve is normal in structure. Aortic valve regurgitation is mild to moderate. No aortic stenosis is present. Aortic valve mean gradient measures 2.5 mmHg. Aortic valve peak gradient measures 4.3 mmHg. Aortic valve area, by VTI measures 3.16 cm. Pulmonic Valve: The pulmonic valve was normal in structure. Pulmonic valve regurgitation is not visualized. No evidence of pulmonic stenosis. Aorta: The aortic root is normal in size and structure. Venous: The inferior vena cava is normal in size with greater than 50% respiratory variability, suggesting right atrial pressure of 3 mmHg. IAS/Shunts: No atrial level shunt detected by color flow Doppler.  LEFT VENTRICLE PLAX 2D LVIDd:         3.54 cm   Diastology LVIDs:         2.34 cm   LV e' medial:    5.22 cm/s LV PW:         0.98 cm   LV E/e' medial:  12.3 LV IVS:        0.89 cm   LV e' lateral:   7.40 cm/s LVOT diam:     2.00 cm   LV E/e' lateral: 8.7 LV SV:         66 LV SV Index:   40 LVOT Area:     3.14 cm  RIGHT VENTRICLE RV Basal diam:  3.40 cm RV S prime:     14.80 cm/s TAPSE (M-mode): 2.7 cm LEFT ATRIUM             Index        RIGHT ATRIUM           Index LA diam:        3.60 cm 2.16 cm/m   RA Area:     18.20 cm LA Vol (A2C):   91.5 ml 54.93 ml/m  RA Volume:   52.90 ml  31.76 ml/m LA Vol (A4C):   62.7 ml 37.64 ml/m LA Biplane Vol: 77.7 ml 46.64 ml/m  AORTIC VALVE AV Area (Vmax):    2.47 cm AV Area (Vmean):   2.53 cm AV Area (VTI):     3.16 cm AV Vmax:           104.05 cm/s AV Vmean:  68.000 cm/s AV  VTI:            0.211 m AV Peak Grad:      4.3 mmHg AV Mean Grad:      2.5 mmHg LVOT Vmax:         81.90 cm/s LVOT Vmean:        54.700 cm/s LVOT VTI:          0.212 m LVOT/AV VTI ratio: 1.00  AORTA Ao Root diam: 2.50 cm MITRAL VALVE                TRICUSPID VALVE MV Area (PHT): 2.76 cm     TR Peak grad:   36.0 mmHg MV Area VTI:   2.13 cm     TR Vmax:        300.00 cm/s MV Peak grad:  5.9 mmHg MV Mean grad:  2.0 mmHg     SHUNTS MV Vmax:       1.21 m/s     Systemic VTI:  0.21 m MV Vmean:      72.7 cm/s    Systemic Diam: 2.00 cm MV Decel Time: 275 msec MV E velocity: 64.30 cm/s MV A velocity: 117.00 cm/s MV E/A ratio:  0.55 Ida Rogue MD Electronically signed by Ida Rogue MD Signature Date/Time: 07/17/2021/4:35:51 PM    Final    [place] CORE BIOPSY (LYMPH NODES)  Result Date: 07/21/2021 INDICATION: 81 year old with history of lung cancer. 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 Plan for ultrasound-guided liver lesion biopsy. Patient also notes a new nodule on the left side of her neck. EXAM: ULTRASOUND-GUIDED LEFT CERVICAL LYMPH NODE BIOPSY MEDICATIONS: None. ANESTHESIA/SEDATION: None FLUOROSCOPY TIME:  None COMPLICATIONS: None immediate. PROCEDURE: Informed written consent was obtained from the patient after a thorough discussion of the procedural risks, benefits and alternatives. All questions were addressed. A timeout was performed prior to the initiation of the procedure. Liver was thoroughly evaluated with ultrasound. The liver is heterogeneous but a discrete lesion was not identified. Left side of the neck was evaluated with ultrasound and an abnormal small lymph node on the left side of the neck was identified. Left cervical lymph node was targeted for biopsy. The left side of the neck was prepped with chlorhexidine and sterile field was created. Skin was anesthetized with 1% lidocaine. Small incision was made. Using ultrasound guidance, an 18 gauge core  device was directed into the lymph node. Four core biopsies were obtained and placed on a Telfa pad with saline. Bandage placed over the puncture site. FINDINGS: Liver is heterogeneous but no discrete lesions could be identified. Therefore, the neck was evaluated for supraclavicular lymphadenopathy. Patient noted a bump on the left side of the neck and there was a rounded small abnormal lymph node at the area of concern. There is also a slightly prominent left supraclavicular lymph node which was not amenable for biopsy. The lymph node in the left mid neck was targeted and biopsied. Biopsy needle was confirmed within the lesion. No immediate bleeding or hematoma formation. IMPRESSION: 1. Ultrasound-guided core biopsy of a small but abnormal looking lymph node on the left side of the neck. 2. Ultrasound-guided liver biopsy was not performed because the liver lesions are not clearly visible on ultrasound. If the neck biopsy is inconclusive or negative, consider further evaluation with PET-CT. CT-guided liver lesion biopsy could be attempted as well. Electronically Signed   By: Markus Daft M.D.   On:  07/21/2021 15:33   CT CHEST ABDOMEN PELVIS WO CONTRAST  Result Date: 07/17/2021 CLINICAL DATA:  Bone lesions seen on brain MRI. Evaluate for underlying malignancy. History of lung cancer. EXAM: CT CHEST, ABDOMEN AND PELVIS WITHOUT CONTRAST TECHNIQUE: Multidetector CT imaging of the chest, abdomen and pelvis was performed following the standard protocol without IV contrast. RADIATION DOSE REDUCTION: This exam was performed according to the departmental dose-optimization program which includes automated exposure control, adjustment of the mA and/or kV according to patient size and/or use of iterative reconstruction technique. COMPARISON:  Chest CT 04/10/2021 FINDINGS: CT CHEST FINDINGS Cardiovascular: The heart is normal in size. No pericardial effusion. The aorta is normal in caliber. Stable atherosclerotic  calcifications. Remarkably no coronary artery calcifications. Mediastinum/Nodes: Progressive left hilar adenopathy the, difficult to measure without contrast. New subcarinal adenopathy with 12.5 mm node on image 28/2. 8.5 mm right paratracheal node on image 20/2. Lungs/Pleura: Enlarging left upper lobe/suprahilar mass measuring approximately 3 cm on image 33/4. Findings consistent with recurrent lung cancer and left hilar and mediastinal adenopathy. No new pulmonary nodules to suggest pulmonary metastatic disease. Progressive right basilar scarring changes and streaky basilar atelectasis. Musculoskeletal: No breast masses are identified. No supraclavicular adenopathy. A few scattered axillary lymph nodes are stable. Suspect scattered subtle slightly sclerotic bone lesions. CT ABDOMEN PELVIS FINDINGS Hepatobiliary: New diffuse hepatic metastatic disease. Numerous small lesions throughout both lobes of the liver. The largest lesion at the right hepatic dome measures 2.5 cm on image 43/2. The gallbladder is surgically absent. No common bile duct dilatation. Pancreas: No mass, inflammation or ductal dilatation. Spleen: Normal size.  No focal lesions. Adrenals/Urinary Tract: Stable right adrenal gland nodule. No worrisome renal lesions are identified without contrast. 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 No complicating features. The small bowel and colon are grossly normal. Vascular/Lymphatic: Stable atherosclerotic calcifications involving the aorta and iliac arteries but no aneurysm. Small scattered mesenteric and retroperitoneal lymph nodes but no mass or overt adenopathy the. Reproductive: Surgically absent. Other: No pelvic mass or adenopathy. No free pelvic fluid collections. No inguinal mass or adenopathy. No abdominal wall hernia or subcutaneous lesions. Musculoskeletal: No lytic destructive bone lesions. No spinal canal compromise. IMPRESSION: 1. Enlarging left upper  lobe/suprahilar mass with associated left hilar and mediastinal adenopathy consistent with recurrent lung cancer. 2. New diffuse hepatic metastatic disease. 3. Suspect scattered subtle slightly sclerotic bone lesions. No lytic or destructive bone lesions. 4. PET-CT may be helpful for accurate staging, if necessary. 5. Stable right adrenal gland nodule. 6. 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 * onc * Aortic Atherosclerosis (ICD10-I70.0). Electronically Signed   By: Rudie Meyer M.D.   On: 07/17/2021 10:58    Labs: Basic Metabolic Panel: Recent Labs  Lab 07/23/21 0558 07/24/21 0500 07/26/21 0648 07/28/21 0739  NA 134* 135 137 134*  K 4.1 3.9 3.8 4.1  CL 104 107 109 108  CO2 19* 19* 20* 22  GLUCOSE 224* 224* 194* 160*  BUN 70* 60* 38* 24*  CREATININE 2.08* 1.56* 1.32* 1.12*  CALCIUM 8.0* 8.2* 8.1* 8.2*    CBC: Recent Labs  Lab 07/23/21 0558 07/25/21 0747 07/26/21 0648 07/27/21 0549 07/28/21 0212  WBC 10.7* 10.2 12.4* 15.3* 14.9*  HGB 9.4* 8.8* 8.6* 8.9* 8.5*  HCT 29.9* 27.5* 26.1* 27.4* 26.3*  MCV 87.9 86.8 86.7 86.2 86.2  PLT 70* 86* 134* 210 244    Microbiology: Lymph node biopsy-  Urine culture- pan-sensitive e coli. Patient was asymptomatic so not treated  Time coordinating discharge: Over 30 minutes  Richarda Osmond, MD  [facility] 07/29/2021, 7:05 AM

## 2021-07-30 ENCOUNTER — Other Ambulatory Visit: Payer: Self-pay

## 2021-07-30 DIAGNOSIS — C3412 Malignant neoplasm of upper lobe, left bronchus or lung: Secondary | ICD-10-CM

## 2021-07-30 DIAGNOSIS — N390 Urinary tract infection, site not specified: Secondary | ICD-10-CM | POA: Clinically undetermined

## 2021-07-30 DIAGNOSIS — I639 Cerebral infarction, unspecified: Secondary | ICD-10-CM | POA: Diagnosis not present

## 2021-07-30 DIAGNOSIS — Z515 Encounter for palliative care: Secondary | ICD-10-CM | POA: Diagnosis not present

## 2021-07-30 DIAGNOSIS — K769 Liver disease, unspecified: Secondary | ICD-10-CM | POA: Diagnosis not present

## 2021-07-30 DIAGNOSIS — N184 Chronic kidney disease, stage 4 (severe): Secondary | ICD-10-CM | POA: Diagnosis not present

## 2021-07-30 DIAGNOSIS — F32A Depression, unspecified: Secondary | ICD-10-CM | POA: Diagnosis present

## 2021-07-30 DIAGNOSIS — M899 Disorder of bone, unspecified: Secondary | ICD-10-CM | POA: Diagnosis not present

## 2021-07-30 DIAGNOSIS — T83511S Infection and inflammatory reaction due to indwelling urethral catheter, sequela: Secondary | ICD-10-CM

## 2021-07-30 LAB — GLUCOSE, CAPILLARY
Glucose-Capillary: 167 mg/dL — ABNORMAL HIGH (ref 70–99)
Glucose-Capillary: 142 mg/dL — ABNORMAL HIGH (ref 70–99)
Glucose-Capillary: 106 mg/dL — ABNORMAL HIGH (ref 70–99)

## 2021-07-30 MED ORDER — COLLAGENASE 250 UNIT/GM EX OINT
TOPICAL_OINTMENT | Freq: Every day | CUTANEOUS | Status: DC
  Filled 2021-07-30: qty 30

## 2021-07-30 MED ORDER — FERROUS GLUCONATE 324 (38 FE) MG PO TABS
324.0000 mg | ORAL_TABLET | Freq: Two times a day (BID) | ORAL | Status: DC
  Administered 2021-07-30 – 2021-07-31 (×3): 324 mg via ORAL
  Filled 2021-07-30 (×4): qty 1

## 2021-07-30 MED ORDER — SODIUM CHLORIDE 0.9 % IV SOLN
1.0000 g | INTRAVENOUS | Status: DC
  Administered 2021-07-30: 1 g via INTRAVENOUS
  Filled 2021-07-30: qty 1
  Filled 2021-07-30: qty 10

## 2021-07-30 MED ORDER — OSIMERTINIB MESYLATE 80 MG PO TABS: 80.0000 mg | ORAL_TABLET | Freq: Every day | ORAL | 0 refills | Status: DC

## 2021-07-30 MED ORDER — OSIMERTINIB MESYLATE 80 MG PO TABS: 80.0000 mg | ORAL_TABLET | Freq: Every day | ORAL | 0 refills | Status: AC

## 2021-07-30 NOTE — Progress Notes (Addendum)
Patient was found on floor soiled with fresh urine and stool. This RN along with other staff members assisted patient into bed and cleaned up patient. Patient remains neurologically unchanged with VSS. Patient denies hitting her head and has no obvious visible/physical signs of any harm/trauma. Due to the type of bed/mattress, there are no bed alarm features. Because of this a chair alarm pad was placed under patient so we could have extra fall prevention/barriers in place. ? ?When patient was asked what happened, she says she doesn't know or remember. When I asked her if she tried to get out of bed or needed help to bathroom/anything, she said "no".  ?MD notified as well as patients husband, Marlou Sa. Will continue to monitor patient.  ? 07/30/21 0310  ?What Happened  ?Was fall witnessed? No  ?Was patient injured? No  ?Patient found on floor  ?Found by Staff-comment  ?Stated prior activity other (comment) ?(patient doesn't know, she was in bed sleeping when last rounded on)  ?Follow Up  ?MD notified Dwyane Dee  ?Time MD notified 98  ?Family notified Yes - comment  ?Time family notified 63  ?Additional tests No  ?Simple treatment Other (comment)  ?Progress note created (see row info) Yes  ?Adult Fall Risk Assessment  ?Risk Factor Category (scoring not indicated) Fall has occurred during this admission (document High fall risk)  ?Age 81  ?Fall History: Fall within 6 months prior to admission 0  ?Elimination; Bowel and/or Urine Incontinence 2  ?Elimination; Bowel and/or Urine Urgency/Frequency 0  ?Medications: includes PCA/Opiates, Anti-convulsants, Anti-hypertensives, Diuretics, Hypnotics, Laxatives, Sedatives, and Psychotropics 5  ?Patient Care Equipment 0  ?Mobility-Assistance 2  ?Mobility-Gait 2  ?Mobility-Sensory Deficit 0  ?Altered awareness of immediate physical environment 0  ?Impulsiveness 0  ?Lack of understanding of one's physical/cognitive limitations 0  ?Total Score 14  ?Patient Fall Risk Level High  fall risk  ?Adult Fall Risk Interventions  ?Required Bundle Interventions *See Row Information* High fall risk - low, moderate, and high requirements implemented  ?Additional Interventions Use of appropriate toileting equipment (bedpan, BSC, etc.);Room near nurses station  ?Screening for Fall Injury Risk (To be completed on HIGH fall risk patients) - Assessing Need for Floor Mats  ?Risk For Fall Injury- Criteria for Floor Mats Previous fall this admission  ?Will Implement Floor Mats Yes  ?Vitals  ?Temp 98.5 ?F (36.9 ?C)  ?Temp Source Oral  ?BP 131/69  ?MAP (mmHg) 87  ?BP Location Left Wrist  ?BP Method Automatic  ?Patient Position (if appropriate) Sitting  ?Pulse Rate 90  ?Pulse Rate Source Monitor  ?Resp 16  ?Oxygen Therapy  ?SpO2 97 %  ?O2 Device Room Air  ?Pain Assessment  ?Pain Scale 0-10  ?Pain Score 2  ?Pain Type Acute pain  ?Pain Location Shoulder  ?Pain Orientation Right  ?Pain Descriptors / Indicators Aching;Discomfort  ?Pain Frequency Intermittent  ?Pain Intervention(s) Repositioned;Rest;Relaxation  ?PCA/Epidural/Spinal Assessment  ?Respiratory Pattern Regular;Unlabored  ?Neurological  ?Neuro (WDL) X  ?Level of Consciousness Alert  ?Orientation Level Oriented X4  ?Cognition Follows commands  ?Speech Clear  ?R Pupil Size (mm) 3  ?R Pupil Shape Round  ?R Pupil Reaction Brisk  ?L Pupil Size (mm) 3  ?L Pupil Shape Round  ?L Pupil Reaction Brisk  ?Motor Function/Sensation Assessment Grip;Dorsiflexion;Plantar flexion  ?Facial Symmetry Symmetrical  ?R Hand Grip Weak  ?L Hand Grip Weak   ?R Foot Dorsiflexion Weak  ?L Foot Dorsiflexion Weak  ?R Foot Plantar Flexion Weak  ?L Foot Plantar Flexion Weak  ?Neuro  Symptoms Depression  ?NIH Stroke Scale ( + Modified Stroke Scale Criteria)   ?Interval Shift assessment  ?Level of Consciousness (1a.)    0  ?LOC Questions (1b. )   + 0  ?LOC Commands (1c. )   +  0  ?Best Gaze (2. )  + 0  ?Visual (3. )  + 0  ?Facial Palsy (4. )     0  ?Motor Arm, Left (5a. )   + 1  ?Motor Arm,  Right (5b. )   + 0  ?Motor Leg, Left (6a. )   + 2  ?Motor Leg, Right (6b. )   + 2  ?Limb Ataxia (7. ) 1  ?Sensory (8. )   + 0  ?Best Language (9. )   + 0  ?Dysarthria (10. ) 0  ?Extinction/Inattention (11.)   + 0  ?Modified SS Total  + 5  ?Complete NIHSS TOTAL 6  ?Musculoskeletal  ?Musculoskeletal (WDL) X  ?Generalized Weakness Yes  ?Weight Bearing Restrictions No  ?Integumentary  ?Integumentary (WDL) X  ?Skin Color Pale  ?Skin Condition Dry  ?Skin Integrity Ecchymosis;Other (Comment) ?(see wound LDA)  ?Ecchymosis Location Hand;Wrist  ?Ecchymosis Location Orientation Bilateral  ?Pain Assessment  ?Date Pain First Started  ?(patient unsure, says shes been having shoulder pain for days)  ?Result of Injury No  ?Pain Assessment  ?Work-Related Injury No  ? ?

## 2021-07-30 NOTE — Assessment & Plan Note (Addendum)
History of lung cancer s/p radiation therapy.  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 According to most recent heme/onc notes/discussions, there is not a definitive plan of treatment set yet until complete staining has resulted.  ?- heme/onc & palliative care consulted  ?-Started on oral Tagrisso ?- f/u outpatient ?

## 2021-07-30 NOTE — Assessment & Plan Note (Addendum)
Hbg A1c <8, moderately well controlled. ?Treated with insulin during admission (sliding scale Novolog, Semglee for basal). ?Resume home medications at discharge: [place] and [place]. ?Continue statin. ?

## 2021-07-30 NOTE — Hospital Course (Addendum)
Natasha [name] is a 81 y.o. female with a PMH significant for A-fib on Eliquis, HTN, HLD, type II DM, COPD, asthma, GERD, depression, former smoker, RLS, OSA, CKD 4, CHF, H/o lung cancer s/p radiation therapy, IDA, chronic pain. ?They presented from home to the ED on 07/17/2021 with dizziness upon waking up on 2/23 and was her normal self when went to bed that previous night. She had worsening of the symptoms and included left leg weakness.  ?Of note, pt is typically on chronic anticoagulation via Eliquis, however, this medication was held for scheduled right-sided carpal tunnel release, which occurred on 07/15/2021. ?In the ED, it was found that they had 2 small right cerebellar infarcts on head CT.  This was followed by brain MRI which showed numerous small acute infarcts scattered in the brain and compatible with central embolic disease.  Additionally, this imaging showed multiple bone lesions that were not previously seen on recent head imaging in 02/2021 and are consistent with metastatic disease. ?Neurology was consulted for further stroke evaluation and treatment.  ?They were treated with continued holding of Eliquis, permissive hypertension, PT/OT consult.  ?2/27- lymph node biopsy ?2/28- patient had recurrent stroke symptoms and new lesions seen on repeat head CT.  ?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 ?3/3- repeat head CT shows expected progression of CVA lesions without new or hemorrhagic conversion. Started on IV heparin gtt without bolus ?3/6- patient discharged to SNF but bed offer was revoked due to presumed cost of her oncology therapy ?3/7- pt started on Rocephin for UTI ?3/8 - palliative consult; oncology started on Tagrisso which can be given at SNF ?3/9 -urine culture shows pansensitive E. coli, transition to Keflex ?

## 2021-07-30 NOTE — Assessment & Plan Note (Addendum)
Metastatic lung ca. management per oncology.  Started on Jet. ?

## 2021-07-30 NOTE — Progress Notes (Signed)
Patient's husband Marlou Sa came to visit patient, and upon further discussion with him regarding patient's safety and fall occurrence last night. Husband has requested that all 4 rails of her bed be up at all times when no one is in the room. ?

## 2021-07-30 NOTE — Progress Notes (Signed)
OT Cancellation Note ? ?Patient Details ?Name: Natasha [name] ?MRN: [id number] ?DOB: 01/27/1941 ? ? ?Cancelled Treatment:    Reason Eval/Treat Not Completed: Patient declined, no reason specified;Fatigue/lethargy limiting ability to participate. Chart reviewed. Upon arrival pt sidelying, husband and grandson in room reporting pt declines therapy at this time. Briefly discussed importance of PO intake, provided magic cup ice cream to trial. Will continue to follow POC and re-attempt OT at later date/time as pt available and medically appropriate. ? ?Dessie Coma, M.S. OTR/L  ?07/30/21, 1:55 PM  ?ascom [phone] ? ?

## 2021-07-30 NOTE — Progress Notes (Signed)
?Progress Note ? ? ?Patient: Natasha [name] PYP:[id number] DOB: 1940-08-19 DOA: 07/17/2021     12 ?DOS: the patient was seen and examined on 07/30/2021 ?  ?Brief hospital course: ?Natasha [name] is a 81 y.o. female with a PMH significant for A-fib on Eliquis, HTN, HLD, type II DM, COPD, asthma, GERD, depression, former smoker, RLS, OSA, CKD 4, CHF, H/o lung cancer s/p radiation therapy, IDA, chronic pain. ?They presented from home to the ED on 07/17/2021 with dizziness upon waking up on 2/23 and was her normal self when went to bed that previous night. She had worsening of the symptoms and included left leg weakness.  ?Of note, pt is typically on chronic anticoagulation via Eliquis, however, this medication was held for scheduled right-sided carpal tunnel release, which occurred on 07/15/2021. ?In the ED, it was found that they had 2 small right cerebellar infarcts on head CT.  This was followed by brain MRI which showed numerous small acute infarcts scattered in the brain and compatible with central embolic disease.  Additionally, this imaging showed multiple bone lesions that were not previously seen on recent head imaging in 02/2021 and are consistent with metastatic disease. ?Neurology was consulted for further stroke evaluation and treatment.  ?They were treated with continued holding of Eliquis, permissive hypertension, PT/OT consult.  ?2/27- lymph node biopsy ?2/28- patient had recurrent stroke symptoms and new lesions seen on repeat head CT.  ?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 ?3/3- repeat head CT shows expected progression of CVA lesions without new or hemorrhagic conversion. Started on IV heparin gtt without bolus ?3/6- patient discharged to SNF but bed offer was revoked due to presumed cost of her oncology therapy ?3/7- pt started on Rocephin for CAUTI ?3/8 - palliative consult ? 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 Recurrence of new lesions and symptoms 2/28.  Pattern is significant for embolic disease in setting of held anticoagulations for recent surgery. Patient denies any new symptoms since 2/28. Repeat head CT 3/3 to monitor progression of the lesions was negative for hemorrhage or mass effect or new lesions.  ?Treated with heparin infusion. ?--Resumed on Eliquis 3/6.   ?--Neurology following, appreciate recommendations ?--PT/OT/SLP - SNF recommended ?--Stat head CT for any new emerging neurologic symptoms ? 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 ? ?CVA (cerebral vascular accident) ([place]) ?See acute embolic stroke ? ?UTI (urinary tract infection) ?On 3/7, pt noted to be more lethargic.  Foley had been placed for acute urinary retention.  ?UA consistent with UTI, started empiric Rocephin.  ?--Continue Rocephin ?--Urine culture pending ?--Bladder scans to monitor for recurrent retention ? ?Metastatic cancer to bone [facility]) ?Per Oncology and Palliative Care. ?Pt desires treatment. ? ?Acute urinary retention ?Foley was placed, d/c'd on 3/7. ?Monitor post-void residual with bladder scans. ? ?Lesion of liver ?Metastatic lung ca. ? ?Bone lesion ?history of lung cancer s/p radiation therapy.  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 According to most recent heme/onc notes/discussions, there is not a definitive plan of treatment set yet until complete staining has resulted.  ?- heme/onc & palliative care consulted  ?- f/u outpatient ? ?Thrombocytopenia ([place]) ?Improving.  Now off heparin. ?Monitor CBC. ? ?Type II diabetes mellitus with renal manifestations ([place]) ?Hbg A1c <8, moderately well controlled. ?Continue sliding scale Novolog, Semglee. ?Continue statin. ? ?Paroxysmal atrial fibrillation ([place]) ?Resumed on Eliquis. ?Continue amiodarone. ?HR controlled. ? ? ?Chronic diastolic CHF (  congestive heart failure) ([place]) ?Appears euvolemic. ?Holding  diuretics. ? ?Iron deficiency anemia ?Continue iron supplement. ?Monitor CBC. ? ?COPD (chronic obstructive pulmonary disease) ([place]) ?Stable without signs of exacerbation. ?PRN bronchodilators. ? ?HLD (hyperlipidemia) ?Continue statin ? ?Essential hypertension ?Now outside permissive hypertension timeframe.  Noted to have soft DBP's. ?-Titrate back on home blood pressure medications when indicated ? ?Carpal tunnel syndrome on right ?on right s/p release on 2/21. Ortho evaluated and removed sutures 3/2. Endorses feeling improvement ?- PT/OT ? ?OSA (obstructive sleep apnea) ?on CPAP ? ?Restless leg syndrome ?Continue home medication ? ?Pressure injury of skin ?I agree with the wound description as below. ?--WOC consulted - see wound care instructions ?--Frequent repositioning ?--monitor closely for s/sx's of infectino ? ?Pressure Injury 07/23/21 Buttocks Left Deep Tissue Pressure Injury - Purple or maroon localized area of discolored intact skin or blood-filled blister due to damage of underlying soft tissue from pressure and/or shear. (Active)  ?07/23/21 1044  ?Location: Buttocks  ?Location Orientation: Left  ?Staging: Deep Tissue Pressure Injury - Purple or maroon localized area of discolored intact skin or blood-filled blister due to damage of underlying soft tissue from pressure and/or shear.  ?Wound Description (Comments):   ?Present on Admission: Yes  ?   ?Pressure Injury 07/23/21 Coccyx Mid Stage 2 -  Partial thickness loss of dermis presenting as a shallow open injury with a red, pink wound bed without slough. red (Active)  ?07/23/21 1045  ?Location: Coccyx  ?Location Orientation: Mid  ?Staging: Stage 2 -  Partial thickness loss of dermis presenting as a shallow open injury with a red, pink wound bed without slough.  ?Wound Description (Comments): red  ?Present on Admission: Yes  ? ? ? ? ? ? ?Depression ?Continue home Zoloft  ? ?Palliative care encounter ?See Palliative Consult Note, appreciate  assistance. ? ?CKD (chronic kidney disease) stage 4, GFR 15-29 ml/min (HCC) ?Renal function close to baseline, initially had AKI which resolved.  Monitor BMP. ? ? ? ? ?  ? ?Subjective: Patient seen this morning with husband and grandson at bedside.  Patient had a fall or slid to the floor from the edge of the bed last night.  She is reporting some back pain this morning but denies hitting her head or loss consciousness.  Otherwise denies specific complaints but says that everything is bothering her.  Palliative care NP entered for consultation. ? ?Physical Exam: ?Vitals:  ? 07/29/21 2050 07/30/21 0310 07/30/21 7741 07/30/21 0725  ?BP: 138/67 131/69 (!) 141/68 (!) 118/57  ?Pulse: 91 90 89 90  ?Resp: $Remov'16 16 16 19  'AHHITT$ ?Temp: 97.6 ?F (36.4 ?C) 98.5 ?F (36.9 ?C)  98.2 ?F (36.8 ?C)  ?TempSrc: Oral Oral    ?SpO2: 97% 97% 95% 96%  ?Weight:      ?Height:      ? ?General exam: awake, alert, no acute distress ?HEENT: atraumatic, clear conjunctiva, anicteric sclera, moist mucus membranes, hearing grossly normal  ?Respiratory system: CTAB no wheezes, rales or rhonchi, normal respiratory effort. ?Cardiovascular system: normal S1/S2, RRR, no pedal edema.   ?Gastrointestinal system: soft, nontender, bowel sounds present ?Central nervous system: A&O x3. no gross focal neurologic deficits, normal speech ?Extremities: moves all, no edema, normal tone ?Skin: dry, intact, normal temperature ?Psychiatry: normal mood, congruent affect, judgement and insight appear normal ? ? ? ?Data Reviewed: ? ?Labs reviewed and notable for CMP with CO2 21, glucose 106, creatinine 1.02, calcium 8.4, albumin 2.3, AST 112, ALT 46, total protein 14.8.  CBC notable for white  count 13.9, hemoglobin 8.7.  Urine culture is pending. ? ?Family Communication: Husband and grandson at bedside on rounds today ? ?Disposition: ?Status is: Inpatient ?Remains inpatient appropriate because: Requires SNF placement for rehab which is pending ? ? ? Planned Discharge Destination:  Skilled nursing facility ? ? ? ?Time spent: 35 minutes ? ?Author: ?Ezekiel Slocumb, DO ?07/30/2021 2:00 PM ? ?For on call review [email or web address].  ?

## 2021-07-30 NOTE — Assessment & Plan Note (Addendum)
Resumed on Eliquis. ?Continue amiodarone. ?Off metoprolol to prevent hypotension. ?HR controlled. ? ?

## 2021-07-30 NOTE — Progress Notes (Signed)
Hematology/Oncology Progress note [phone] [phone]     Patient Care Team: Lesleigh Noe, MD as PCP - General (Family Medicine) Kate Sable, MD as PCP - Cardiology (Cardiology) Telford Nab, RN as Oncology Nurse Navigator   Name of the patient: Natasha [name]  [id number]  06/07/1940  Date of visit: 07/30/21   INTERVAL HISTORY-   #07/21/2021, left cervical lymph node biopsy pathology is positive for malignancy, metastatic adenocarcinoma, compatible with lung primary.  #07/22/2021 MRI brain without contrast showed multiple new embolic strokes since last MRI 5 days ago.   #07/25/2021 CT head wo expected CT appearance of the scattered bilateral infarcts on 07/22/2021 MRI. No associated hemorrhage or mass effect. No new intracranial abnormality. 07/25/2021 Restarted on heparin gtt without bolus.  07/29/2021.  UTI, started on antibiotics.  Patient is currently sleeping.  Husband is at the bedside.  Per husband, patient did not get much sleep. Positive for weakness.  He had a discussion with palliative care service Vonna [facility] today.    Allergies  Allergen Reactions   Atorvastatin     Muscle/joint aches   Lantus [Insulin Glargine] Hives   Lyrica [Pregabalin] Other (See Comments)    Headache, disorientation   Lisinopril Hives and Cough         Patient Active Problem List   Diagnosis Date Noted   UTI (urinary tract infection) 07/30/2021   Depression 07/30/2021   Palliative care encounter    Acute urinary retention    Metastatic cancer to bone [facility])    Posterior circulation stroke (HCC)    Brain metastases (HCC)    Pressure injury of skin 07/24/2021   Anemia    Thrombocytopenia (HCC)    CVA (cerebral vascular accident) ([place]) 07/18/2021   Acute ischemic stroke ([place]) 07/17/2021   HLD (hyperlipidemia) 07/17/2021   Type II diabetes mellitus with renal manifestations ([place]) 07/17/2021   COPD (chronic obstructive pulmonary disease) ([place])  07/17/2021   CKD (chronic kidney disease), stage IV (HCC) 07/17/2021   Chronic diastolic CHF (congestive heart failure) ([place]) 07/17/2021   Leukocytosis 07/17/2021   Bone lesion 32/95/1884   Acute embolic stroke ([place]) 16/60/6301   Lung cancer metastatic to brain ([place]) 07/17/2021   Carpal tunnel syndrome on right 07/17/2021   Lesion of liver    Vitamin B12 deficiency 07/14/2021   Localized swelling, mass and lump, neck 07/14/2021   Elevated sed rate 07/14/2021   Vitamin D deficiency 07/14/2021   CKD (chronic kidney disease) stage 4, GFR 15-29 ml/min ([place]) 06/24/2021   Polyarthralgia 06/16/2021   Muscle weakness 06/16/2021   Hyperkalemia 06/16/2021   Unsteadiness on feet 06/16/2021   Acute leg pain, right 06/16/2021   Double vision 03/13/2021   Dizziness 03/13/2021   COVID-19 virus infection 02/24/2021   Atrial flutter ([place]) 02/24/2021   Chronic bilateral low back pain with bilateral sciatica 02/17/2021   Decreased sensation 11/27/2020   Dermatitis 11/27/2020   Axillary lymphadenopathy 10/11/2020   Cancer of upper lobe of left lung ([place]) 10/11/2020   Goals of care, counseling/discussion 10/11/2020   Status post reverse total shoulder replacement, 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   Diverticulitis of colon 02/03/2020   Chronic pain syndrome 01/12/2019   Neuropathic pain 01/12/2019   At high risk for falls 11/02/2018   Neuroforaminal stenosis of lumbar spine 11/02/2018   Sacroiliitis ([place]) 11/02/2018   Former smoker 09/23/2018   Spinal stenosis of lumbar region without neurogenic claudication 09/23/2018   Weakness of both hands 09/23/2018   Overweight (BMI 25.0-29.9) 08/10/2018   Insomnia  06/17/2018   Chronic kidney disease with symptom management only, stage 3 (moderate) (HCC) 05/27/2018   Nonrheumatic aortic valve stenosis 05/24/2018   Iron deficiency anemia 12/24/2017   Paroxysmal atrial fibrillation ([place]) 12/24/2017   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/24/2017   History of gastric  bypass 08/13/2017   Gastroesophageal reflux disease 11/17/2016   OSA (obstructive sleep apnea) 07/24/2016   Diverticulosis 07/21/2016   Osteopenia of multiple sites 06/04/2016   Closed compression fracture of thoracic vertebra ([place]) 04/02/2015   Mixed hyperlipidemia 04/02/2015   Restless leg syndrome 04/02/2015   Closed fracture of lateral portion of left tibial plateau 04/06/2014   Essential hypertension 04/06/2014   Mild intermittent asthma without complication 09/32/3557   Type 2 diabetes mellitus with other specified complication ([place]) 32/20/2542     Past Medical History:  Diagnosis Date   A-fib ([place])    a.) CHA2DS2-VASc Score = 6 (age x 2, sex, HTN, aortic plaque, T2DM). 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   Chronic cough    CKD (chronic kidney disease), stage III (HCC)    Complication of anesthesia    COPD (chronic obstructive pulmonary disease) (HCC)    Depression    Diastolic dysfunction    a.) TTE 04/08/2014: EF 60%; mild concentric LVH; G2DD. b.) TTE 12/21/2019: EF 60-65%, LA mildly dilated, mild-mod MR; PASP 36.8; G1DD. c.) TTE 02/25/2021: EF 60-65%; normal LV function with mild concentric LVH; G1DD   Diverticulitis    Dyspnea    High cholesterol    History of 2019 novel coronavirus disease (COVID-19) 02/24/2021   History of hiatal hernia    History of kidney stones    Hypertension    Insomnia    Long term current use of anticoagulant    a.) apixaban   Lumbar spinal stenosis    Mild asthma    Murmur    Non-small cell carcinoma of left lung, stage 1 ([place]) 09/30/2020   a.) clinical stage 1 (cT1cN0cM0). b.)  treated with SBRT (60 cGy over 5 fractions).   OSA on CPAP    Osteoporosis    Restless leg    Sepsis ([place])    T2DM (type 2 diabetes mellitus) ([place])      Past Surgical History:  Procedure Laterality Date   ABDOMINAL HYSTERECTOMY  1978   ANTERIOR INTEROSSEOUS NERVE DECOMPRESSION Right 07/15/2021   Procedure: Cubital tunnel release;  Surgeon: Hessie Knows, MD;  Location: [facility] ORS;  Service: Orthopedics;  Laterality: Right;   APPENDECTOMY  1978   BREAST BIOPSY Left ?   papilloma   BREAST CYST EXCISION Bilateral yrs ago   benign, scars not well visualized   BREAST SURGERY     CARPAL TUNNEL RELEASE Left 04/22/2021   Procedure: Left carpal tunnel release & ulnar nerve release at elbow;  Surgeon: Hessie Knows, MD;  Location: [facility] ORS;  Service: Orthopedics;  Laterality: Left;   CARPAL TUNNEL RELEASE Right 07/15/2021   Procedure: CARPAL TUNNEL RELEASE;  Surgeon: Hessie Knows, MD;  Location: [facility] ORS;  Service: Orthopedics;  Laterality: Right;   CATARACT EXTRACTION W/ INTRAOCULAR LENS  IMPLANT, BILATERAL Bilateral    CHOLECYSTECTOMY     COLONOSCOPY  ELBOW SURGERY Right    [place] release   EYE SURGERY     FRACTURE SURGERY     GASTRIC BYPASS  12/22/2017   [place]   JOINT REPLACEMENT     KNEE SURGERY Left    tibial fracture with metal plate   TOTAL SHOULDER REPLACEMENT Left June 27, 2012   ULNAR TUNNEL RELEASE Left 04/22/2021   Procedure: CUBITAL TUNNEL RELEASE;  Surgeon: Hessie Knows, MD;  Location: [facility] ORS;  Service: Orthopedics;  Laterality: Left;   VIDEO BRONCHOSCOPY WITH ENDOBRONCHIAL NAVIGATION N/A 09/30/2020   Procedure: ROBOTIC ASSISTED VIDEO BRONCHOSCOPY WITH ENDOBRONCHIAL NAVIGATION;  Surgeon: Tyler Pita, MD;  Location: [facility] ORS;  Service: Pulmonary;  Laterality: N/A;   WRIST SURGERY Left    fractures    Social History   Socioeconomic History   Marital status: Married    Spouse name: Scientist, physiological   Number of children: 1   Years of education: some college   Highest  education level: Not on file  Occupational History   Not on file  Tobacco Use   Smoking status: Former    Packs/day: 1.00    Years: 12.00    Pack years: 12.00    Types: Cigarettes    Quit date: 05/26/1975    Years since quitting: 46.2    Passive exposure: Past   Smokeless tobacco: Never  Vaping Use   Vaping Use: Never used  Substance and Sexual Activity   Alcohol use: No    Comment: rarely   Drug use: Never   Sexual activity: Not Currently  Other Topics Concern   Not on file  Social History Narrative   07/22/20   [place]   Living: with husband, Scientist, physiological [phone])   Work: retired - high end Journalist, newspaper      Family: grandson - Ovid Curd 27-Jun-1998) (son is deceased) - and living with them      Enjoys: Enjoys Social worker, going to art shows, gardening and painting      Exercise: not currently   Diet: does not follow diabetic diet      Safety   Seat belts: Yes    Guns: Yes  and secure   Safe in relationships: Yes    Social Determinants of Health   Financial Resource Strain: Not on file  Food Insecurity: Not on file  Transportation Needs: Not on file  Physical Activity: Not on file  Stress: Not on file  Social Connections: Not on file  Intimate Partner Violence: Not on file     Family History  Problem Relation Age of Onset   Other Mother        died from surgery   AAA (abdominal aortic aneurysm) Mother    Diabetes Father        controlled by diet   Dementia Father        brain atrophy - unknown origin   Breast cancer Cousin        maternal     Current Facility-Administered Medications:     stroke: mapping our early stages of recovery book, , Does not apply, Once, Howerter, Justin B, DO   acetaminophen (TYLENOL) tablet 650 mg, 650 mg, Oral, Q6H PRN, Ivor Costa, MD, 650 mg at 07/30/21 1506   amiodarone (PACERONE) tablet 200 mg, 200 mg, Oral, Daily, Doristine Mango L, MD, 200 mg at 07/30/21 0814   apixaban (ELIQUIS)  tablet 5 mg, 5 mg, Oral, BID, Richarda Osmond, MD, 5 mg at  07/30/21 0814   ascorbic acid (VITAMIN C) tablet 500 mg, 500 mg, Oral, BID, Doristine Mango L, MD, 500 mg at 07/30/21 9381   calcium-vitamin D (OSCAL WITH D) 500-5 MG-MCG per tablet 1 tablet, 1 tablet, Oral, Daily, Ivor Costa, MD, 1 tablet at 07/30/21 0814   cefTRIAXone (ROCEPHIN) 1 g in sodium chloride 0.9 % 100 mL IVPB, 1 g, Intravenous, Q24H, Griffith, Kelly A, DO, Last Rate: 200 mL/hr at 07/30/21 1512, 1 g at 07/30/21 1512   chlorhexidine (PERIDEX) 0.12 % solution 5 mL, 5 mL, Mouth/Throat, BID, Ivor Costa, MD, 5 mL at 07/28/21 2121   Chlorhexidine Gluconate Cloth 2 % PADS 6 each, 6 each, Topical, Daily, Richarda Osmond, MD, 6 each at 07/29/21 0845   collagenase (SANTYL) ointment, , Topical, Daily, Ezekiel Slocumb, DO, Given at 07/30/21 1653   dextromethorphan-guaiFENesin ([place] DM) 30-600 MG per 12 hr tablet 1 tablet, 1 tablet, Oral, BID PRN, Ivor Costa, MD   ferrous gluconate (FERGON) tablet 324 mg, 324 mg, Oral, BID WC, Earlie Server, MD   gabapentin (NEURONTIN) capsule 300 mg, 300 mg, Oral, QHS, Niu, Soledad Gerlach, MD, 300 mg at 07/29/21 2153   insulin aspart (novoLOG) injection 0-9 Units, 0-9 Units, Subcutaneous, TID WC, Ivor Costa, MD, 1 Units at 07/30/21 1203   insulin glargine-yfgn (SEMGLEE) injection 15 Units, 15 Units, Subcutaneous, Daily, Richarda Osmond, MD, 15 Units at 07/30/21 0813   levalbuterol (XOPENEX) nebulizer solution 1.25 mg, 1.25 mg, Inhalation, Q8H PRN, Ivor Costa, MD   LORazepam (ATIVAN) tablet 0.5 mg, 0.5 mg, Oral, BID PRN, Richarda Osmond, MD, 0.5 mg at 07/28/21 2121   multivitamin with minerals tablet 1 tablet, 1 tablet, Oral, Daily, Richarda Osmond, MD, 1 tablet at 07/30/21 0814   ondansetron (ZOFRAN) injection 4 mg, 4 mg, Intravenous, Q8H PRN, Ivor Costa, MD   polyethylene glycol (MIRALAX / GLYCOLAX) packet 17 g, 17 g, Oral, Daily, Wyvonnia Dusky, MD, 17 g at 07/28/21 8299   pramipexole  (MIRAPEX) tablet 1 mg, 1 mg, Oral, QHS, Ivor Costa, MD, 1 mg at 07/29/21 2153   rosuvastatin (CRESTOR) tablet 10 mg, 10 mg, Oral, QHS, Wyvonnia Dusky, MD, 10 mg at 07/29/21 2153   sertraline (ZOLOFT) tablet 50 mg, 50 mg, Oral, QHS, Ivor Costa, MD, 50 mg at 07/29/21 2153   traMADol (ULTRAM) tablet 50 mg, 50 mg, Oral, Q12H PRN, Richarda Osmond, MD, 50 mg at 07/29/21 2153   zinc sulfate capsule 220 mg, 220 mg, Oral, Daily, Richarda Osmond, MD, 220 mg at 07/30/21 0814   Physical exam:  Vitals:   07/30/21 0310 07/30/21 0458 07/30/21 0725 07/30/21 1657  BP: 131/69 (!) 141/68 (!) 118/57 121/65  Pulse: 90 89 90 88  Resp: _0 Temp: 98.5 F (36.9 C)  98.2 F (36.8 C) 97.9 F (36.6 C)  TempSrc: Oral     SpO2: 97% 95% 96% 94%  Weight:      Height:       Physical Exam HENT:     Head: Normocephalic and atraumatic.  Cardiovascular:     Rate and Rhythm: Normal rate.  Pulmonary:     Effort: No respiratory distress.  Abdominal:     General: There is no distension.  Skin:    Findings: No rash.  Neurological:     Mental Status: She is alert.     Comments: Sleeping.       CMP Latest Ref Rng & Units 07/29/2021  Glucose 70 - 99  mg/dL 106(H)  BUN 8 - 23 mg/dL 20  Creatinine 0.44 - 1.00 mg/dL 1.02(H)  Sodium 135 - 145 mmol/L 136  Potassium 3.5 - 5.1 mmol/L 4.3  Chloride 98 - 111 mmol/L 107  CO2 22 - 32 mmol/L 21(L)  Calcium 8.9 - 10.3 mg/dL 8.4(L)  Total Protein 6.5 - 8.1 g/dL 4.8(L)  Total Bilirubin 0.3 - 1.2 mg/dL 0.7  Alkaline Phos 38 - 126 U/L 217(H)  AST 15 - 41 U/L 112(H)  ALT 0 - 44 U/L 46(H)   CBC Latest Ref Rng & Units 07/29/2021  WBC 4.0 - 10.5 K/uL 13.9(H)  Hemoglobin 12.0 - 15.0 g/dL 8.7(L)  Hematocrit 36.0 - 46.0 % 27.5(L)  Platelets 150 - 400 K/uL 274    RADIOGRAPHIC STUDIES: I have personally reviewed the radiological images as listed and agreed with the findings in the report. CT HEAD WO CONTRAST (5MM)  Result Date: 07/25/2021 CLINICAL DATA:   81 year old female with recent neurologic deficit and embolic appearing infarcts scattered throughout the brain on 07/22/2021. EXAM: CT HEAD WITHOUT CONTRAST TECHNIQUE: Contiguous axial images were obtained from the base of the skull through the vertex without intravenous contrast. RADIATION DOSE REDUCTION: This exam was performed according to the departmental dose-optimization program which includes automated exposure control, adjustment of the mA and/or kV according to patient size and/or use of iterative reconstruction technique. COMPARISON:  Brain MRI 07/22/2021.  Head CT 07/17/2021. FINDINGS: Brain: Development of cytotoxic edema corresponding to the DWI positive infarcts on the recent MRI, most conspicuous in the left occipital pole (series 2, image 12), right centrum semiovale (image 18), and cerebellum (image 9). No associated hemorrhage. No mass effect. Stable gray-white matter differentiation otherwise. No ventriculomegaly. Normal basilar cisterns. Vascular: Calcified atherosclerosis at the skull base. No suspicious intracranial vascular hyperdensity. Skull: Osteopenia.  No acute osseous abnormality identified. Sinuses/Orbits: Trace fluid layering in the left sphenoid sinus. Other Visualized paranasal sinuses and mastoids are clear. Other: No acute orbit or scalp soft tissue finding. IMPRESSION: 1. Expected CT appearance of the scattered bilateral infarcts on 07/22/2021 MRI. No associated hemorrhage or mass effect. 2. No new intracranial abnormality. Electronically Signed   By: Genevie Ann M.D.   On: 07/25/2021 08:56   CT Head Wo Contrast  Result Date: 07/17/2021 CLINICAL DATA:  Nonspecific dizziness EXAM: CT HEAD WITHOUT CONTRAST TECHNIQUE: Contiguous axial images were obtained from the base of the skull through the vertex without intravenous contrast. RADIATION DOSE REDUCTION: This exam was performed according to the departmental dose-optimization program which includes automated exposure control,  adjustment of the mA and/or kV according to patient size and/or use of iterative reconstruction technique. COMPARISON:  03/09/2021 FINDINGS: Brain: 2 small right cerebellar infarcts not seen on prior, possibly recent and symptomatic. Few remote supratentorial white matter insults. No hemorrhage, hydrocephalus, or collection. Age normal brain volume. Vascular: No hyperdense vessel or unexpected calcification. Skull: Normal. Negative for fracture or focal lesion. Sinuses/Orbits: No acute finding. 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 Electronically Signed   By: Jorje Guild M.D.   On: 07/17/2021 04:32   MR ANGIO HEAD WO CONTRAST  Result Date: 07/17/2021 CLINICAL DATA:  Dizziness, infarcts on MRI EXAM: MRA NECK WITHOUT CONTRAST MRA HEAD WITHOUT CONTRAST TECHNIQUE: Angiographic images of the Circle of Willis were acquired using MRA technique without intravenous contrast. COMPARISON:  None. FINDINGS: MRA NECK FINDINGS Standard aortic branching. Common, internal, and external carotid arteries are patent, without hemodynamically significant stenosis.  Extracranial vertebral arteries are patent, without hemodynamically significant stenosis, although imaging of the origins is somewhat limited by artifact. MRA HEAD FINDINGS Both internal carotid arteries are patent to the termini, without significant stenosis. A1 segments patent. Normal anterior communicating artery. Anterior cerebral arteries are patent to their distal aspects. No M1 stenosis or occlusion. Normal MCA bifurcations. Distal MCA branches perfused and symmetric. Vertebral arteries patent to the vertebrobasilar junction without stenosis. Basilar patent to its distal aspect. Superior cerebellar arteries patent bilaterally. Patent P1 segments, diminutive on the right. Near fetal origin of the right PCA with patent right posterior communicating artery. PCAs perfused to their distal  aspects without stenosis. Possible diminutive left posterior communicating artery. IMPRESSION: 1.  No intracranial large vessel occlusion or significant stenosis. 2.  No hemodynamically significant stenosis in the neck. Electronically Signed   By: Merilyn Baba M.D.   On: 07/17/2021 23:08   MR ANGIO NECK WO CONTRAST  Result Date: 07/17/2021 CLINICAL DATA:  Dizziness, infarcts on MRI EXAM: MRA NECK WITHOUT CONTRAST MRA HEAD WITHOUT CONTRAST TECHNIQUE: Angiographic images of the Circle of Willis were acquired using MRA technique without intravenous contrast. COMPARISON:  None. FINDINGS: MRA NECK FINDINGS Standard aortic branching. Common, internal, and external carotid arteries are patent, without hemodynamically significant stenosis. Extracranial vertebral arteries are patent, without hemodynamically significant stenosis, although imaging of the origins is somewhat limited by artifact. MRA HEAD FINDINGS Both internal carotid arteries are patent to the termini, without significant stenosis. A1 segments patent. Normal anterior communicating artery. Anterior cerebral arteries are patent to their distal aspects. No M1 stenosis or occlusion. Normal MCA bifurcations. Distal MCA branches perfused and symmetric. Vertebral arteries patent to the vertebrobasilar junction without stenosis. Basilar patent to its distal aspect. Superior cerebellar arteries patent bilaterally. Patent P1 segments, diminutive on the right. Near fetal origin of the right PCA with patent right posterior communicating artery. PCAs perfused to their distal aspects without stenosis. Possible diminutive left posterior communicating artery. IMPRESSION: 1.  No intracranial large vessel occlusion or significant stenosis. 2.  No hemodynamically significant stenosis in the neck. Electronically Signed   By: Merilyn Baba M.D.   On: 07/17/2021 23:08   MR BRAIN WO CONTRAST  Result Date: 07/22/2021 CLINICAL DATA:  Acute neuro deficit. Stroke. Now with  bilateral leg weakness and diplopia. EXAM: MRI HEAD WITHOUT CONTRAST TECHNIQUE: Multiplanar, multiecho pulse sequences of the brain and surrounding structures were obtained without intravenous contrast. COMPARISON:  MRI head 07/17/2021 FINDINGS: Brain: Numerous areas of acute infarct are present compatible with emboli. Multiple small infarcts in the cerebellum bilaterally are stable. Progressive acute infarct in the left occipital pole with mild associated petechial hemorrhage which was seen previously. Multiple small areas of acute infarct in the frontal and parietal lobes bilaterally and in the white matter. Progression of cluster of acute infarcts in the right parietal white matter and also in the right medial parietal cortex. Small acute infarct head of caudate on the left unchanged. Small acute infarct head of caudate on the right is new. Ventricle size normal.  No mass or midline shift. Vascular: Normal arterial flow voids. Skull and upper cervical spine: Bilateral calvarial lesions are again noted. These are suspicious for metastatic disease. Sinuses/Orbits: Paranasal sinuses clear. Bilateral cataract extraction Other: None IMPRESSION: Numerous areas of acute infarct in the cerebrum and cerebellum bilaterally compatible with acute embolic infarction. There has been progression of acute infarcts since the recent MRI of 07/17/2021. Findings suggest recurrent emboli. Progression of infarct left occipital pole. Petechial hemorrhage  in this area unchanged from the prior study. No other hemorrhage. Lesions in the calvarium bilaterally, suspicious for metastatic disease. Electronically Signed   By: Franchot Gallo M.D.   On: 07/22/2021 14:59   MR BRAIN WO CONTRAST  Result Date: 07/17/2021 CLINICAL DATA:  Nonspecific dizziness. EXAM: MRI HEAD WITHOUT CONTRAST TECHNIQUE: Multiplanar, multiecho pulse sequences of the brain and surrounding structures were obtained without intravenous contrast. COMPARISON:  Head CT  from earlier today FINDINGS: Brain: Patchy acute infarcts in the bilateral cerebellum and bilateral frontal, parietal, and occipital convexities. Patchy acute infarct in the right more than left centrum semiovale and in the left caudate head. Mild petechial hemorrhage at the right occipital cortex. No hematoma, hydrocephalus, or collection. Vascular: Normal flow voids Skull and upper cervical spine: New scattered bone lesions in the C2 right articular process, right para median clivus tip, and in the bilateral calvarium, affected areas marked on sagittal T2 weighted imaging. Sinuses/Orbits: Negative IMPRESSION: 1. Numerous small acute infarcts scattered in the brain and compatible with central embolic disease. 2. 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 Recommend metastatic workup. Electronically Signed   By: Jorje Guild M.D.   On: 07/17/2021 05:42   ECHOCARDIOGRAM COMPLETE  Result Date: 07/17/2021    ECHOCARDIOGRAM REPORT   Patient Name:   [place] Date of Exam: 07/17/2021 Medical Rec #:  [id number]     Height:       64.0 in Accession #:    [id number]    Weight:       137.0 lb Date of Birth:  [birth year]     BSA:          1.666 m Patient Age:    38 years      BP:           131/66 mmHg Patient Gender: F             HR:           67 bpm. Exam Location:  [facility] Procedure: 2D Echo, Cardiac Doppler and Color Doppler Indications:     Stroke I63.9  History:         Patient has prior history of Echocardiogram examinations, most                  recent 02/25/2021. COPD, Arrythmias:Atrial Fibrillation;                  Signs/Symptoms:Murmur.  Sonographer:     Sherrie Sport Referring Phys:  [id number] Ivor Costa Diagnosing Phys: Ida Rogue MD  Sonographer Comments: No parasternal window and suboptimal apical window. Image acquisition challenging due to COPD. IMPRESSIONS  1. Left ventricular ejection fraction, by estimation, is 60 to 65%. The left ventricle has normal function. The left ventricle has  no regional wall motion abnormalities. Left ventricular diastolic parameters are consistent with Grade I diastolic dysfunction (impaired relaxation).  2. Right ventricular systolic function is normal. The right ventricular size is normal. There is mildly elevated pulmonary artery systolic pressure. The estimated right ventricular systolic pressure is 62.2 mmHg.  3. The mitral valve is normal in structure. No evidence of mitral valve regurgitation. No evidence of mitral stenosis.  4. The aortic valve is normal in structure. Aortic valve regurgitation is mild to moderate. No aortic stenosis is present.  5. The inferior vena cava is normal in size with greater than 50% respiratory variability, suggesting right atrial pressure of 3 mmHg. FINDINGS  Left Ventricle: Left ventricular ejection fraction, by  estimation, is 60 to 65%. The left ventricle has normal function. The left ventricle has no regional wall motion abnormalities. The left ventricular internal cavity size was normal in size. There is  no left ventricular hypertrophy. Left ventricular diastolic parameters are consistent with Grade I diastolic dysfunction (impaired relaxation). Right Ventricle: The right ventricular size is normal. No increase in right ventricular wall thickness. Right ventricular systolic function is normal. There is mildly elevated pulmonary artery systolic pressure. The tricuspid regurgitant velocity is 3.00  m/s, and with an assumed right atrial pressure of 5 mmHg, the estimated right ventricular systolic pressure is 76.1 mmHg. Left Atrium: Left atrial size was normal in size. Right Atrium: Right atrial size was normal in size. Pericardium: There is no evidence of pericardial effusion. Mitral Valve: The mitral valve is normal in structure. Mild mitral annular calcification. No evidence of mitral valve regurgitation. No evidence of mitral valve stenosis. MV peak gradient, 5.9 mmHg. The mean mitral valve gradient is 2.0 mmHg. Tricuspid  Valve: The tricuspid valve is normal in structure. Tricuspid valve regurgitation is not demonstrated. No evidence of tricuspid stenosis. Aortic Valve: The aortic valve is normal in structure. Aortic valve regurgitation is mild to moderate. No aortic stenosis is present. Aortic valve mean gradient measures 2.5 mmHg. Aortic valve peak gradient measures 4.3 mmHg. Aortic valve area, by VTI measures 3.16 cm. Pulmonic Valve: The pulmonic valve was normal in structure. Pulmonic valve regurgitation is not visualized. No evidence of pulmonic stenosis. Aorta: The aortic root is normal in size and structure. Venous: The inferior vena cava is normal in size with greater than 50% respiratory variability, suggesting right atrial pressure of 3 mmHg. IAS/Shunts: No atrial level shunt detected by color flow Doppler.  LEFT VENTRICLE PLAX 2D LVIDd:         3.54 cm   Diastology LVIDs:         2.34 cm   LV e' medial:    5.22 cm/s LV PW:         0.98 cm   LV E/e' medial:  12.3 LV IVS:        0.89 cm   LV e' lateral:   7.40 cm/s LVOT diam:     2.00 cm   LV E/e' lateral: 8.7 LV SV:         66 LV SV Index:   40 LVOT Area:     3.14 cm  RIGHT VENTRICLE RV Basal diam:  3.40 cm RV S prime:     14.80 cm/s TAPSE (M-mode): 2.7 cm LEFT ATRIUM             Index        RIGHT ATRIUM           Index LA diam:        3.60 cm 2.16 cm/m   RA Area:     18.20 cm LA Vol (A2C):   91.5 ml 54.93 ml/m  RA Volume:   52.90 ml  31.76 ml/m LA Vol (A4C):   62.7 ml 37.64 ml/m LA Biplane Vol: 77.7 ml 46.64 ml/m  AORTIC VALVE AV Area (Vmax):    2.47 cm AV Area (Vmean):   2.53 cm AV Area (VTI):     3.16 cm AV Vmax:           104.05 cm/s AV Vmean:          68.000 cm/s AV VTI:            0.211 m AV Peak  Grad:      4.3 mmHg AV Mean Grad:      2.5 mmHg LVOT Vmax:         81.90 cm/s LVOT Vmean:        54.700 cm/s LVOT VTI:          0.212 m LVOT/AV VTI ratio: 1.00  AORTA Ao Root diam: 2.50 cm MITRAL VALVE                TRICUSPID VALVE MV Area (PHT): 2.76 cm     TR  Peak grad:   36.0 mmHg MV Area VTI:   2.13 cm     TR Vmax:        300.00 cm/s MV Peak grad:  5.9 mmHg MV Mean grad:  2.0 mmHg     SHUNTS MV Vmax:       1.21 m/s     Systemic VTI:  0.21 m MV Vmean:      72.7 cm/s    Systemic Diam: 2.00 cm MV Decel Time: 275 msec MV E velocity: 64.30 cm/s MV A velocity: 117.00 cm/s MV E/A ratio:  0.55 Ida Rogue MD Electronically signed by Ida Rogue MD Signature Date/Time: 07/17/2021/4:35:51 PM    Final    [place] CORE BIOPSY (LYMPH NODES)  Result Date: 07/21/2021 INDICATION: 81 year old with history of lung cancer. 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 Plan for ultrasound-guided liver lesion biopsy. Patient also notes a new nodule on the left side of her neck. EXAM: ULTRASOUND-GUIDED LEFT CERVICAL LYMPH NODE BIOPSY MEDICATIONS: None. ANESTHESIA/SEDATION: None FLUOROSCOPY TIME:  None COMPLICATIONS: None immediate. PROCEDURE: Informed written consent was obtained from the patient after a thorough discussion of the procedural risks, benefits and alternatives. All questions were addressed. A timeout was performed prior to the initiation of the procedure. Liver was thoroughly evaluated with ultrasound. The liver is heterogeneous but a discrete lesion was not identified. Left side of the neck was evaluated with ultrasound and an abnormal small lymph node on the left side of the neck was identified. Left cervical lymph node was targeted for biopsy. The left side of the neck was prepped with chlorhexidine and sterile field was created. Skin was anesthetized with 1% lidocaine. Small incision was made. Using ultrasound guidance, an 18 gauge core device was directed into the lymph node. Four core biopsies were obtained and placed on a Telfa pad with saline. Bandage placed over the puncture site. FINDINGS: Liver is heterogeneous but no discrete lesions could be identified. Therefore, the neck was evaluated for supraclavicular  lymphadenopathy. Patient noted a bump on the left side of the neck and there was a rounded small abnormal lymph node at the area of concern. There is also a slightly prominent left supraclavicular lymph node which was not amenable for biopsy. The lymph node in the left mid neck was targeted and biopsied. Biopsy needle was confirmed within the lesion. No immediate bleeding or hematoma formation. IMPRESSION: 1. Ultrasound-guided core biopsy of a small but abnormal looking lymph node on the left side of the neck. 2. Ultrasound-guided liver biopsy was not performed because the liver lesions are not clearly visible on ultrasound. If the neck biopsy is inconclusive or negative, consider further evaluation with PET-CT. CT-guided liver lesion biopsy could be attempted as well. Electronically Signed   By: Markus Daft M.D.   On: 07/21/2021 15:33   CT CHEST ABDOMEN PELVIS WO CONTRAST  Result Date: 07/17/2021 CLINICAL DATA:  Bone lesions  seen on brain MRI. Evaluate for underlying malignancy. History of lung cancer. EXAM: CT CHEST, ABDOMEN AND PELVIS WITHOUT CONTRAST TECHNIQUE: Multidetector CT imaging of the chest, abdomen and pelvis was performed following the standard protocol without IV contrast. RADIATION DOSE REDUCTION: This exam was performed according to the departmental dose-optimization program which includes automated exposure control, adjustment of the mA and/or kV according to patient size and/or use of iterative reconstruction technique. COMPARISON:  Chest CT 04/10/2021 FINDINGS: CT CHEST FINDINGS Cardiovascular: The heart is normal in size. No pericardial effusion. The aorta is normal in caliber. Stable atherosclerotic calcifications. Remarkably no coronary artery calcifications. Mediastinum/Nodes: Progressive left hilar adenopathy the, difficult to measure without contrast. New subcarinal adenopathy with 12.5 mm node on image 28/2. 8.5 mm right paratracheal node on image 20/2. Lungs/Pleura: Enlarging left  upper lobe/suprahilar mass measuring approximately 3 cm on image 33/4. Findings consistent with recurrent lung cancer and left hilar and mediastinal adenopathy. No new pulmonary nodules to suggest pulmonary metastatic disease. Progressive right basilar scarring changes and streaky basilar atelectasis. Musculoskeletal: No breast masses are identified. No supraclavicular adenopathy. A few scattered axillary lymph nodes are stable. Suspect scattered subtle slightly sclerotic bone lesions. CT ABDOMEN PELVIS FINDINGS Hepatobiliary: New diffuse hepatic metastatic disease. Numerous small lesions throughout both lobes of the liver. The largest lesion at the right hepatic dome measures 2.5 cm on image 43/2. The gallbladder is surgically absent. No common bile duct dilatation. Pancreas: No mass, inflammation or ductal dilatation. Spleen: Normal size.  No focal lesions. Adrenals/Urinary Tract: Stable right adrenal gland nodule. No worrisome renal lesions are identified without contrast. 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 No complicating features. The small bowel and colon are grossly normal. Vascular/Lymphatic: Stable atherosclerotic calcifications involving the aorta and iliac arteries but no aneurysm. Small scattered mesenteric and retroperitoneal lymph nodes but no mass or overt adenopathy the. Reproductive: Surgically absent. Other: No pelvic mass or adenopathy. No free pelvic fluid collections. No inguinal mass or adenopathy. No abdominal wall hernia or subcutaneous lesions. Musculoskeletal: No lytic destructive bone lesions. No spinal canal compromise. IMPRESSION: 1. Enlarging left upper lobe/suprahilar mass with associated left hilar and mediastinal adenopathy consistent with recurrent lung cancer. 2. New diffuse hepatic metastatic disease. 3. Suspect scattered subtle slightly sclerotic bone lesions. No lytic or destructive bone lesions. 4. PET-CT may be helpful for accurate staging, if  necessary. 5. Stable right adrenal gland nodule. 6. 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 * onc * Aortic Atherosclerosis (ICD10-I70.0). Electronically Signed   By: Marijo Sanes M.D.   On: 07/17/2021 10:58    Assessment and plan-   # recurrent metastatic lung cancer, with liver and bone metastasis. Status post ultrasound-guided left cervical lymphadenopathy.  Pathology is positive for adenocarcinoma, lung origin.   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 Awaiting NGS results from recent biopsy  specimen.  If she does not have any targetable mutation, then treatment option will be chemotherapy plus minus immunotherapy.   #Catheter associated UTI, on ceftriaxone.  #Acute embolic stroke, recurrent.  Secondary to A-fib and likely underlying hypercoagulable state due to cancer. Continue Eliquis 5 mg twice daily.  She tolerates well.  #Anemia due to CKD continue oral iron supplementation, change to ferrous sulfate 325 mg twice daily.  Patient needs to follow-up outpatient at the cancer center to review pathology results and management  plan. Discussed with the plan with patient's husband who is at bedside. CODE STATUS, DNR/DNI, Thank you for allowing me to participate in the care of this patient.   Earlie Server, MD, PhD Hematology Oncology 07/30/2021

## 2021-07-30 NOTE — Assessment & Plan Note (Signed)
Continue iron supplement. ?Monitor CBC. ?

## 2021-07-30 NOTE — Assessment & Plan Note (Addendum)
Now outside permissive hypertension timeframe.  Noted to have soft DBP's. ? ?Holding spironolactone and metoprolol to avoid hypotension.  Resume if/when BP will tolerate. ?

## 2021-07-30 NOTE — Assessment & Plan Note (Signed)
See Palliative Consult Note, appreciate assistance. ?

## 2021-07-30 NOTE — Assessment & Plan Note (Signed)
-   Continue home medication 

## 2021-07-30 NOTE — Assessment & Plan Note (Addendum)
on right s/p release on 2/21.  ?Ortho evaluated and removed sutures 3/2. Endorses feeling improvement. ?Follow up outpatient. ?

## 2021-07-30 NOTE — Assessment & Plan Note (Signed)
See acute embolic stroke ? ?

## 2021-07-30 NOTE — Assessment & Plan Note (Addendum)
Improving.  Possibly related to heparin. ?Monitor CBC. ?

## 2021-07-30 NOTE — Assessment & Plan Note (Signed)
I agree with the wound description as below. ?--WOC consulted - see wound care instructions ?--Frequent repositioning ?--monitor closely for s/sx's of infectino ? ?Pressure Injury 07/23/21 Buttocks Left Deep Tissue Pressure Injury - Purple or maroon localized area of discolored intact skin or blood-filled blister due to damage of underlying soft tissue from pressure and/or shear. (Active)  ?07/23/21 1044  ?Location: Buttocks  ?Location Orientation: Left  ?Staging: Deep Tissue Pressure Injury - Purple or maroon localized area of discolored intact skin or blood-filled blister due to damage of underlying soft tissue from pressure and/or shear.  ?Wound Description (Comments):   ?Present on Admission: Yes  ?   ?Pressure Injury 07/23/21 Coccyx Mid Stage 2 -  Partial thickness loss of dermis presenting as a shallow open injury with a red, pink wound bed without slough. red (Active)  ?07/23/21 1045  ?Location: Coccyx  ?Location Orientation: Mid  ?Staging: Stage 2 -  Partial thickness loss of dermis presenting as a shallow open injury with a red, pink wound bed without slough.  ?Wound Description (Comments): red  ?Present on Admission: Yes  ? ? ? ? ? ?

## 2021-07-30 NOTE — Assessment & Plan Note (Signed)
   Continue home Zoloft 

## 2021-07-30 NOTE — Assessment & Plan Note (Signed)
Renal function close to baseline, initially had AKI which resolved.  Monitor BMP. ?

## 2021-07-30 NOTE — Assessment & Plan Note (Addendum)
Present on admission. Recurrence of new lesions and symptoms 2/28.  Pattern is significant for embolic disease in setting of held anticoagulations for recent surgery. Patient denies any new symptoms since 2/28. Repeat head CT 3/3 to monitor progression of the lesions was negative for hemorrhage or mass effect or new lesions.  ?Treated with heparin infusion. ?--Resumed on Eliquis 3/6.   ?--Neurology consulted, outpatient follow-up as needed ?--PT/OT/SLP - SNF recommended ?

## 2021-07-30 NOTE — Consult Note (Signed)
[place] at [facility] [phone] [phone]   Name: Natasha [name] Date: 07/30/2021 MRN: [id number]  DOB: 03-Mar-1941  Patient Care Team: Lesleigh Noe, MD as PCP - General (Family Medicine) Kate Sable, MD as PCP - Cardiology (Cardiology) Telford Nab, RN as Oncology Nurse Navigator    REASON FOR CONSULTATION: Natasha [name] is a 81 y.o. female with multiple medical problems including history of lung cancer status postradiation, A-fib on Eliquis, COPD, OSA, CKD stage IV, and history of CHF.  Patient was admitted to the hospital on 07/17/2021 with acute left-sided weakness.  Eliquis was recently held for elective surgical procedure.  Patient was found to have acute CVA on head CT/brain MRI.  Patient was also incidentally found to have metastatic disease in bone and liver.  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  Palliative care is consulted over address goals.  SOCIAL HISTORY:     reports that she quit smoking about 46 years ago. Her smoking use included cigarettes. She has a 12.00 pack-year smoking history. She has been exposed to tobacco smoke. She has never used smokeless tobacco. She reports that she does not drink alcohol and does not use drugs.  Patient is married to her husband of over 54 years.  She had a son who is now deceased.  She has a grandson that lives with her.  Patient worked as a Journalist, newspaper.  ADVANCE DIRECTIVES:  On file  CODE STATUS: DNR  PAST MEDICAL HISTORY: Past Medical History:  Diagnosis Date   A-fib ([place])    a.) CHA2DS2-VASc Score = 6 (age x 2, sex, HTN, aortic plaque, T2DM). 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   Chronic cough    CKD (chronic kidney disease), stage III (HCC)    Complication of anesthesia    COPD (chronic obstructive pulmonary disease) (HCC)    Depression    Diastolic dysfunction    a.) TTE 04/08/2014: EF 60%; mild concentric LVH; G2DD. b.) TTE 12/21/2019: EF 60-65%, LA mildly dilated, mild-mod MR; PASP 36.8; G1DD. c.) TTE 02/25/2021: EF 60-65%; normal LV function with mild concentric LVH; G1DD   Diverticulitis    Dyspnea    High cholesterol    History of 2019 novel coronavirus disease (COVID-19) 02/24/2021   History of hiatal hernia    History of kidney stones    Hypertension    Insomnia    Long term current use of anticoagulant    a.) apixaban   Lumbar spinal stenosis    Mild asthma    Murmur    Non-small cell carcinoma of left lung, stage 1 ([place]) 09/30/2020   a.) clinical stage 1 (cT1cN0cM0). b.) treated with SBRT (60 cGy over 5 fractions).   OSA on CPAP    Osteoporosis    Restless leg    Sepsis ([place])    T2DM (type 2 diabetes mellitus) ([place])     PAST SURGICAL HISTORY:  Past Surgical History:  Procedure Laterality Date   ABDOMINAL HYSTERECTOMY  1978  ANTERIOR INTEROSSEOUS NERVE DECOMPRESSION Right 07/15/2021   Procedure: Cubital tunnel release;  Surgeon: Hessie Knows, MD;  Location: [facility] ORS;  Service: Orthopedics;  Laterality: Right;   APPENDECTOMY  1978   BREAST BIOPSY Left ?   papilloma   BREAST CYST EXCISION Bilateral yrs ago   benign, scars not well visualized   BREAST SURGERY     CARPAL TUNNEL RELEASE Left 04/22/2021   Procedure: Left carpal tunnel release & ulnar nerve release at elbow;  Surgeon: Hessie Knows, MD;  Location: [facility] ORS;  Service: Orthopedics;  Laterality: Left;   CARPAL TUNNEL RELEASE Right 07/15/2021    Procedure: CARPAL TUNNEL RELEASE;  Surgeon: Hessie Knows, MD;  Location: [facility] ORS;  Service: Orthopedics;  Laterality: Right;   CATARACT EXTRACTION W/ INTRAOCULAR LENS  IMPLANT, BILATERAL Bilateral    CHOLECYSTECTOMY     COLONOSCOPY     ELBOW SURGERY Right    [place] release   EYE SURGERY     FRACTURE SURGERY     GASTRIC BYPASS  12/22/2017   Roux-N-Y   JOINT REPLACEMENT     KNEE SURGERY Left    tibial fracture with metal plate   TOTAL SHOULDER REPLACEMENT Left 2014   ULNAR TUNNEL RELEASE Left 04/22/2021   Procedure: CUBITAL TUNNEL RELEASE;  Surgeon: Hessie Knows, MD;  Location: [facility] OR[facility];  Service: Orthopedics;  Laterality: Left;   VIDEO BRONCHOSCOPY WITH ENDOBRONCHIAL NAVIGATION N/A 09/30/2020   Procedure: ROBOTIC ASSISTED VIDEO BRONCHOSCOPY WITH ENDOBRONCHIAL NAVIGATION;  Surgeon: Tyler Pita, MD;  Location: [facility] OR[facility];  Service: Pulmonary;  Laterality: N/A;   WRIST SURGERY Left    fractures    HEMATOLOGY/ONCOLOGY HISTORY:  Oncology History  Cancer of upper lobe of left lung ([place])  10/11/2020 Initial Diagnosis   Cancer of upper lobe of left lung ([place])   10/11/2020 Cancer Staging   Staging form: Lung, AJCC 8th Edition - Clinical stage from 10/11/2020: cT1, cN0, cM0 - Signed by Earlie Server, MD on 10/11/2020 Stage prefix: Initial diagnosis Laterality: Left    Lung cancer metastatic to brain ([place])  07/17/2021 Initial Diagnosis   Lung cancer metastatic to brain ([place])   07/25/2021 Cancer Staging   Staging form: Lung, AJCC 8th Edition - Clinical stage from 07/25/2021: Stage IV (cTX, cNX, pM1) - Signed by Earlie Server, MD on 07/25/2021 Stage prefix: Initial diagnosis      ALLERGIES:  is allergic to atorvastatin, lantus [insulin glargine], lyrica [pregabalin], and lisinopril.  MEDICATIONS:  Current Facility-Administered Medications  Medication Dose Route Frequency Provider Last Rate Last Admin    stroke: mapping our early stages of recovery book   Does not apply Once Howerter,  Justin B, DO       acetaminophen (TYLENOL) tablet 650 mg  650 mg Oral Q6H PRN Ivor Costa, MD   650 mg at 07/28/21 2841   amiodarone (PACERONE) tablet 200 mg  200 mg Oral Daily Richarda Osmond, MD   200 mg at 07/30/21 3244   apixaban (ELIQUIS) tablet 5 mg  5 mg Oral BID Richarda Osmond, MD   5 mg at 07/30/21 0102   ascorbic acid (VITAMIN C) tablet 500 mg  500 mg Oral BID Richarda Osmond, MD   500 mg at 07/30/21 7253   calcium-vitamin D (OSCAL WITH D) 500-5 MG-MCG per tablet 1 tablet  1 tablet Oral Daily Ivor Costa, MD   1 tablet at 07/30/21 0814   cefTRIAXone (ROCEPHIN) 1 g in sodium chloride 0.9 % 100 mL IVPB  1 g Intravenous Q24H  Richarda Osmond, MD   Stopped at 07/29/21 1527   chlorhexidine (PERIDEX) 0.12 % solution 5 mL  5 mL Mouth/Throat BID Ivor Costa, MD   5 mL at 07/28/21 2121   Chlorhexidine Gluconate Cloth 2 % PADS 6 each  6 each Topical Daily Richarda Osmond, MD   6 each at 07/29/21 0845   collagenase (SANTYL) ointment   Topical Daily Nicole Kindred A, DO       dextromethorphan-guaiFENesin ([place] DM) 30-600 MG per 12 hr tablet 1 tablet  1 tablet Oral BID PRN Ivor Costa, MD       ferrous gluconate (FERGON) tablet 324 mg  324 mg Oral BID WC Earlie Server, MD       gabapentin (NEURONTIN) capsule 300 mg  300 mg Oral QHS Ivor Costa, MD   300 mg at 07/29/21 2153   insulin aspart (novoLOG) injection 0-9 Units  0-9 Units Subcutaneous TID WC Ivor Costa, MD   1 Units at 07/30/21 1203   insulin glargine-yfgn (SEMGLEE) injection 15 Units  15 Units Subcutaneous Daily Richarda Osmond, MD   15 Units at 07/30/21 0813   levalbuterol (XOPENEX) nebulizer solution 1.25 mg  1.25 mg Inhalation Q8H PRN Ivor Costa, MD       LORazepam (ATIVAN) tablet 0.5 mg  0.5 mg Oral BID PRN Richarda Osmond, MD   0.5 mg at 07/28/21 2121   multivitamin with minerals tablet 1 tablet  1 tablet Oral Daily Richarda Osmond, MD   1 tablet at 07/30/21 0814   ondansetron (ZOFRAN) injection 4 mg  4 mg  Intravenous Q8H PRN Ivor Costa, MD       polyethylene glycol (MIRALAX / GLYCOLAX) packet 17 g  17 g Oral Daily Wyvonnia Dusky, MD   17 g at 07/28/21 0160   pramipexole (MIRAPEX) tablet 1 mg  1 mg Oral QHS Ivor Costa, MD   1 mg at 07/29/21 2153   rosuvastatin (CRESTOR) tablet 10 mg  10 mg Oral QHS Wyvonnia Dusky, MD   10 mg at 07/29/21 2153   sertraline (ZOLOFT) tablet 50 mg  50 mg Oral QHS Ivor Costa, MD   50 mg at 07/29/21 2153   traMADol (ULTRAM) tablet 50 mg  50 mg Oral Q12H PRN Richarda Osmond, MD   50 mg at 07/29/21 2153   zinc sulfate capsule 220 mg  220 mg Oral Daily Richarda Osmond, MD   220 mg at 07/30/21 0814    VITAL SIGNS: BP (!) 118/57 (BP Location: Right Arm)    Pulse 90    Temp 98.2 F (36.8 C)    Resp 19    Ht $R'5\' 4"'cY$  (1.626 m)    Wt 150 lb (68 kg)    SpO2 96%    BMI 25.75 kg/m  Filed Weights   07/26/21 0500 07/27/21 0300 07/28/21 0500  Weight: 142 lb (64.4 kg) 141 lb 15.6 oz (64.4 kg) 150 lb (68 kg)    Estimated body mass index is 25.75 kg/m as calculated from the following:   Height as of this encounter: $RemoveBeforeD'5\' 4"'[id number]$  (1.626 m).   Weight as of this encounter: 150 lb (68 kg).  LABS: CBC:    Component Value Date/Time   WBC 13.9 (H) 07/29/2021 1350   HGB 8.7 (L) 07/29/2021 1350   HCT 27.5 (L) 07/29/2021 1350   PLT 274 07/29/2021 1350   MCV 87.6 07/29/2021 1350   NEUTROABS 6.6 06/16/2021 1039   LYMPHSABS 1.3 06/16/2021 1039  MONOABS 0.6 06/16/2021 1039   EOSABS 0.1 06/16/2021 1039   BASOSABS 0.0 06/16/2021 1039   Comprehensive Metabolic Panel:    Component Value Date/Time   NA 136 07/29/2021 1350   K 4.3 07/29/2021 1350   CL 107 07/29/2021 1350   CO2 21 (L) 07/29/2021 1350   BUN 20 07/29/2021 1350   CREATININE 1.02 (H) 07/29/2021 1350   CREATININE 1.84 (H) 06/23/2021 1526   GLUCOSE 106 (H) 07/29/2021 1350   CALCIUM 8.4 (L) 07/29/2021 1350   AST 112 (H) 07/29/2021 1350   ALT 46 (H) 07/29/2021 1350   ALKPHOS 217 (H) 07/29/2021 1350   BILITOT  0.7 07/29/2021 1350   PROT 4.8 (L) 07/29/2021 1350   ALBUMIN 2.3 (L) 07/29/2021 1350    RADIOGRAPHIC STUDIES: CT HEAD WO CONTRAST (5MM)  Result Date: 07/25/2021 CLINICAL DATA:  81 year old female with recent neurologic deficit and embolic appearing infarcts scattered throughout the brain on 07/22/2021. EXAM: CT HEAD WITHOUT CONTRAST TECHNIQUE: Contiguous axial images were obtained from the base of the skull through the vertex without intravenous contrast. RADIATION DOSE REDUCTION: This exam was performed according to the departmental dose-optimization program which includes automated exposure control, adjustment of the mA and/or kV according to patient size and/or use of iterative reconstruction technique. COMPARISON:  Brain MRI 07/22/2021.  Head CT 07/17/2021. FINDINGS: Brain: Development of cytotoxic edema corresponding to the DWI positive infarcts on the recent MRI, most conspicuous in the left occipital pole (series 2, image 12), right centrum semiovale (image 18), and cerebellum (image 9). No associated hemorrhage. No mass effect. Stable gray-white matter differentiation otherwise. No ventriculomegaly. Normal basilar cisterns. Vascular: Calcified atherosclerosis at the skull base. No suspicious intracranial vascular hyperdensity. Skull: Osteopenia.  No acute osseous abnormality identified. Sinuses/Orbits: Trace fluid layering in the left sphenoid sinus. Other Visualized paranasal sinuses and mastoids are clear. Other: No acute orbit or scalp soft tissue finding. IMPRESSION: 1. Expected CT appearance of the scattered bilateral infarcts on 07/22/2021 MRI. No associated hemorrhage or mass effect. 2. No new intracranial abnormality. Electronically Signed   By: Genevie Ann M.D.   On: 07/25/2021 08:56   CT Head Wo Contrast  Result Date: 07/17/2021 CLINICAL DATA:  Nonspecific dizziness EXAM: CT HEAD WITHOUT CONTRAST TECHNIQUE: Contiguous axial images were obtained from the base of the skull through the vertex  without intravenous contrast. RADIATION DOSE REDUCTION: This exam was performed according to the departmental dose-optimization program which includes automated exposure control, adjustment of the mA and/or kV according to patient size and/or use of iterative reconstruction technique. COMPARISON:  03/09/2021 FINDINGS: Brain: 2 small right cerebellar infarcts not seen on prior, possibly recent and symptomatic. Few remote supratentorial white matter insults. No hemorrhage, hydrocephalus, or collection. Age normal brain volume. Vascular: No hyperdense vessel or unexpected calcification. Skull: Normal. Negative for fracture or focal lesion. Sinuses/Orbits: No acute finding. 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 Electronically Signed   By: Jorje Guild M.D.   On: 07/17/2021 04:32   MR ANGIO HEAD WO CONTRAST  Result Date: 07/17/2021 CLINICAL DATA:  Dizziness, infarcts on MRI EXAM: MRA NECK WITHOUT CONTRAST MRA HEAD WITHOUT CONTRAST TECHNIQUE: Angiographic images of the Circle of Willis were acquired using MRA technique without intravenous contrast. COMPARISON:  None. FINDINGS: MRA NECK FINDINGS Standard aortic branching. Common, internal, and external carotid arteries are patent, without hemodynamically significant stenosis. Extracranial vertebral arteries are patent, without hemodynamically significant stenosis, although imaging of the origins is somewhat  limited by artifact. MRA HEAD FINDINGS Both internal carotid arteries are patent to the termini, without significant stenosis. A1 segments patent. Normal anterior communicating artery. Anterior cerebral arteries are patent to their distal aspects. No M1 stenosis or occlusion. Normal MCA bifurcations. Distal MCA branches perfused and symmetric. Vertebral arteries patent to the vertebrobasilar junction without stenosis. Basilar patent to its distal aspect. Superior cerebellar arteries  patent bilaterally. Patent P1 segments, diminutive on the right. Near fetal origin of the right PCA with patent right posterior communicating artery. PCAs perfused to their distal aspects without stenosis. Possible diminutive left posterior communicating artery. IMPRESSION: 1.  No intracranial large vessel occlusion or significant stenosis. 2.  No hemodynamically significant stenosis in the neck. Electronically Signed   By: Merilyn Baba M.D.   On: 07/17/2021 23:08   MR ANGIO NECK WO CONTRAST  Result Date: 07/17/2021 CLINICAL DATA:  Dizziness, infarcts on MRI EXAM: MRA NECK WITHOUT CONTRAST MRA HEAD WITHOUT CONTRAST TECHNIQUE: Angiographic images of the Circle of Willis were acquired using MRA technique without intravenous contrast. COMPARISON:  None. FINDINGS: MRA NECK FINDINGS Standard aortic branching. Common, internal, and external carotid arteries are patent, without hemodynamically significant stenosis. Extracranial vertebral arteries are patent, without hemodynamically significant stenosis, although imaging of the origins is somewhat limited by artifact. MRA HEAD FINDINGS Both internal carotid arteries are patent to the termini, without significant stenosis. A1 segments patent. Normal anterior communicating artery. Anterior cerebral arteries are patent to their distal aspects. No M1 stenosis or occlusion. Normal MCA bifurcations. Distal MCA branches perfused and symmetric. Vertebral arteries patent to the vertebrobasilar junction without stenosis. Basilar patent to its distal aspect. Superior cerebellar arteries patent bilaterally. Patent P1 segments, diminutive on the right. Near fetal origin of the right PCA with patent right posterior communicating artery. PCAs perfused to their distal aspects without stenosis. Possible diminutive left posterior communicating artery. IMPRESSION: 1.  No intracranial large vessel occlusion or significant stenosis. 2.  No hemodynamically significant stenosis in the neck.  Electronically Signed   By: Merilyn Baba M.D.   On: 07/17/2021 23:08   MR BRAIN WO CONTRAST  Result Date: 07/22/2021 CLINICAL DATA:  Acute neuro deficit. Stroke. Now with bilateral leg weakness and diplopia. EXAM: MRI HEAD WITHOUT CONTRAST TECHNIQUE: Multiplanar, multiecho pulse sequences of the brain and surrounding structures were obtained without intravenous contrast. COMPARISON:  MRI head 07/17/2021 FINDINGS: Brain: Numerous areas of acute infarct are present compatible with emboli. Multiple small infarcts in the cerebellum bilaterally are stable. Progressive acute infarct in the left occipital pole with mild associated petechial hemorrhage which was seen previously. Multiple small areas of acute infarct in the frontal and parietal lobes bilaterally and in the white matter. Progression of cluster of acute infarcts in the right parietal white matter and also in the right medial parietal cortex. Small acute infarct head of caudate on the left unchanged. Small acute infarct head of caudate on the right is new. Ventricle size normal.  No mass or midline shift. Vascular: Normal arterial flow voids. Skull and upper cervical spine: Bilateral calvarial lesions are again noted. These are suspicious for metastatic disease. Sinuses/Orbits: Paranasal sinuses clear. Bilateral cataract extraction Other: None IMPRESSION: Numerous areas of acute infarct in the cerebrum and cerebellum bilaterally compatible with acute embolic infarction. There has been progression of acute infarcts since the recent MRI of 07/17/2021. Findings suggest recurrent emboli. Progression of infarct left occipital pole. Petechial hemorrhage in this area unchanged from the prior study. No other hemorrhage. Lesions in the calvarium bilaterally,  suspicious for metastatic disease. Electronically Signed   By: Franchot Gallo M.D.   On: 07/22/2021 14:59   MR BRAIN WO CONTRAST  Result Date: 07/17/2021 CLINICAL DATA:  Nonspecific dizziness. EXAM: MRI  HEAD WITHOUT CONTRAST TECHNIQUE: Multiplanar, multiecho pulse sequences of the brain and surrounding structures were obtained without intravenous contrast. COMPARISON:  Head CT from earlier today FINDINGS: Brain: Patchy acute infarcts in the bilateral cerebellum and bilateral frontal, parietal, and occipital convexities. Patchy acute infarct in the right more than left centrum semiovale and in the left caudate head. Mild petechial hemorrhage at the right occipital cortex. No hematoma, hydrocephalus, or collection. Vascular: Normal flow voids Skull and upper cervical spine: New scattered bone lesions in the C2 right articular process, right para median clivus tip, and in the bilateral calvarium, affected areas marked on sagittal T2 weighted imaging. Sinuses/Orbits: Negative IMPRESSION: 1. Numerous small acute infarcts scattered in the brain and compatible with central embolic disease. 2. 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 Recommend metastatic workup. Electronically Signed   By: Jorje Guild M.D.   On: 07/17/2021 05:42   ECHOCARDIOGRAM COMPLETE  Result Date: 07/17/2021    ECHOCARDIOGRAM REPORT   Patient Name:   [facility] Date of Exam: 07/17/2021 Medical Rec #:  [id number]     Height:       64.0 in Accession #:    [id number]    Weight:       137.0 lb Date of Birth:  [birth year]     BSA:          1.666 m Patient Age:    4 years      BP:           131/66 mmHg Patient Gender: F             HR:           67 bpm. Exam Location:  [facility] Procedure: 2D Echo, Cardiac Doppler and Color Doppler Indications:     Stroke I63.9  History:         Patient has prior history of Echocardiogram examinations, most                  recent 02/25/2021. COPD, Arrythmias:Atrial Fibrillation;                  Signs/Symptoms:Murmur.  Sonographer:     Sherrie Sport Referring Phys:  [id number] Ivor Costa Diagnosing Phys: Ida Rogue MD  Sonographer Comments: No parasternal window and suboptimal apical window. Image  acquisition challenging due to COPD. IMPRESSIONS  1. Left ventricular ejection fraction, by estimation, is 60 to 65%. The left ventricle has normal function. The left ventricle has no regional wall motion abnormalities. Left ventricular diastolic parameters are consistent with Grade I diastolic dysfunction (impaired relaxation).  2. Right ventricular systolic function is normal. The right ventricular size is normal. There is mildly elevated pulmonary artery systolic pressure. The estimated right ventricular systolic pressure is 17.0 mmHg.  3. The mitral valve is normal in structure. No evidence of mitral valve regurgitation. No evidence of mitral stenosis.  4. The aortic valve is normal in structure. Aortic valve regurgitation is mild to moderate. No aortic stenosis is present.  5. The inferior vena cava is normal in size with greater than 50% respiratory variability, suggesting right atrial pressure of 3 mmHg. FINDINGS  Left Ventricle: Left ventricular ejection fraction, by estimation, is 60 to 65%. The left ventricle has normal function. The left ventricle has no  regional wall motion abnormalities. The left ventricular internal cavity size was normal in size. There is  no left ventricular hypertrophy. Left ventricular diastolic parameters are consistent with Grade I diastolic dysfunction (impaired relaxation). Right Ventricle: The right ventricular size is normal. No increase in right ventricular wall thickness. Right ventricular systolic function is normal. There is mildly elevated pulmonary artery systolic pressure. The tricuspid regurgitant velocity is 3.00  m/s, and with an assumed right atrial pressure of 5 mmHg, the estimated right ventricular systolic pressure is 30.0 mmHg. Left Atrium: Left atrial size was normal in size. Right Atrium: Right atrial size was normal in size. Pericardium: There is no evidence of pericardial effusion. Mitral Valve: The mitral valve is normal in structure. Mild mitral annular  calcification. No evidence of mitral valve regurgitation. No evidence of mitral valve stenosis. MV peak gradient, 5.9 mmHg. The mean mitral valve gradient is 2.0 mmHg. Tricuspid Valve: The tricuspid valve is normal in structure. Tricuspid valve regurgitation is not demonstrated. No evidence of tricuspid stenosis. Aortic Valve: The aortic valve is normal in structure. Aortic valve regurgitation is mild to moderate. No aortic stenosis is present. Aortic valve mean gradient measures 2.5 mmHg. Aortic valve peak gradient measures 4.3 mmHg. Aortic valve area, by VTI measures 3.16 cm. Pulmonic Valve: The pulmonic valve was normal in structure. Pulmonic valve regurgitation is not visualized. No evidence of pulmonic stenosis. Aorta: The aortic root is normal in size and structure. Venous: The inferior vena cava is normal in size with greater than 50% respiratory variability, suggesting right atrial pressure of 3 mmHg. IAS/Shunts: No atrial level shunt detected by color flow Doppler.  LEFT VENTRICLE PLAX 2D LVIDd:         3.54 cm   Diastology LVIDs:         2.34 cm   LV e' medial:    5.22 cm/s LV PW:         0.98 cm   LV E/e' medial:  12.3 LV IVS:        0.89 cm   LV e' lateral:   7.40 cm/s LVOT diam:     2.00 cm   LV E/e' lateral: 8.7 LV SV:         66 LV SV Index:   40 LVOT Area:     3.14 cm  RIGHT VENTRICLE RV Basal diam:  3.40 cm RV S prime:     14.80 cm/s TAPSE (M-mode): 2.7 cm LEFT ATRIUM             Index        RIGHT ATRIUM           Index LA diam:        3.60 cm 2.16 cm/m   RA Area:     18.20 cm LA Vol (A2C):   91.5 ml 54.93 ml/m  RA Volume:   52.90 ml  31.76 ml/m LA Vol (A4C):   62.7 ml 37.64 ml/m LA Biplane Vol: 77.7 ml 46.64 ml/m  AORTIC VALVE AV Area (Vmax):    2.47 cm AV Area (Vmean):   2.53 cm AV Area (VTI):     3.16 cm AV Vmax:           104.05 cm/s AV Vmean:          68.000 cm/s AV VTI:            0.211 m AV Peak Grad:      4.3 mmHg AV Mean Grad:  2.5 mmHg LVOT Vmax:         81.90 cm/s LVOT  Vmean:        54.700 cm/s LVOT VTI:          0.212 m LVOT/AV VTI ratio: 1.00  AORTA Ao Root diam: 2.50 cm MITRAL VALVE                TRICUSPID VALVE MV Area (PHT): 2.76 cm     TR Peak grad:   36.0 mmHg MV Area VTI:   2.13 cm     TR Vmax:        300.00 cm/s MV Peak grad:  5.9 mmHg MV Mean grad:  2.0 mmHg     SHUNTS MV Vmax:       1.21 m/s     Systemic VTI:  0.21 m MV Vmean:      72.7 cm/s    Systemic Diam: 2.00 cm MV Decel Time: 275 msec MV E velocity: 64.30 cm/s MV A velocity: 117.00 cm/s MV E/A ratio:  0.55 Ida Rogue MD Electronically signed by Ida Rogue MD Signature Date/Time: 07/17/2021/4:35:51 PM    Final    [place] CORE BIOPSY (LYMPH NODES)  Result Date: 07/21/2021 INDICATION: 81 year old with history of lung cancer. 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 Plan for ultrasound-guided liver lesion biopsy. Patient also notes a new nodule on the left side of her neck. EXAM: ULTRASOUND-GUIDED LEFT CERVICAL LYMPH NODE BIOPSY MEDICATIONS: None. ANESTHESIA/SEDATION: None FLUOROSCOPY TIME:  None COMPLICATIONS: None immediate. PROCEDURE: Informed written consent was obtained from the patient after a thorough discussion of the procedural risks, benefits and alternatives. All questions were addressed. A timeout was performed prior to the initiation of the procedure. Liver was thoroughly evaluated with ultrasound. The liver is heterogeneous but a discrete lesion was not identified. Left side of the neck was evaluated with ultrasound and an abnormal small lymph node on the left side of the neck was identified. Left cervical lymph node was targeted for biopsy. The left side of the neck was prepped with chlorhexidine and sterile field was created. Skin was anesthetized with 1% lidocaine. Small incision was made. Using ultrasound guidance, an 18 gauge core device was directed into the lymph node. Four core biopsies were obtained and placed on a Telfa pad with saline.  Bandage placed over the puncture site. FINDINGS: Liver is heterogeneous but no discrete lesions could be identified. Therefore, the neck was evaluated for supraclavicular lymphadenopathy. Patient noted a bump on the left side of the neck and there was a rounded small abnormal lymph node at the area of concern. There is also a slightly prominent left supraclavicular lymph node which was not amenable for biopsy. The lymph node in the left mid neck was targeted and biopsied. Biopsy needle was confirmed within the lesion. No immediate bleeding or hematoma formation. IMPRESSION: 1. Ultrasound-guided core biopsy of a small but abnormal looking lymph node on the left side of the neck. 2. Ultrasound-guided liver biopsy was not performed because the liver lesions are not clearly visible on ultrasound. If the neck biopsy is inconclusive or negative, consider further evaluation with PET-CT. CT-guided liver lesion biopsy could be attempted as well. Electronically Signed   By: Markus Daft M.D.   On: 07/21/2021 15:33   CT CHEST ABDOMEN PELVIS WO CONTRAST  Result Date: 07/17/2021 CLINICAL DATA:  Bone lesions seen on brain MRI. Evaluate for underlying malignancy. History of lung cancer. EXAM: CT CHEST, ABDOMEN  AND PELVIS WITHOUT CONTRAST TECHNIQUE: Multidetector CT imaging of the chest, abdomen and pelvis was performed following the standard protocol without IV contrast. RADIATION DOSE REDUCTION: This exam was performed according to the departmental dose-optimization program which includes automated exposure control, adjustment of the mA and/or kV according to patient size and/or use of iterative reconstruction technique. COMPARISON:  Chest CT 04/10/2021 FINDINGS: CT CHEST FINDINGS Cardiovascular: The heart is normal in size. No pericardial effusion. The aorta is normal in caliber. Stable atherosclerotic calcifications. Remarkably no coronary artery calcifications. Mediastinum/Nodes: Progressive left hilar adenopathy the,  difficult to measure without contrast. New subcarinal adenopathy with 12.5 mm node on image 28/2. 8.5 mm right paratracheal node on image 20/2. Lungs/Pleura: Enlarging left upper lobe/suprahilar mass measuring approximately 3 cm on image 33/4. Findings consistent with recurrent lung cancer and left hilar and mediastinal adenopathy. No new pulmonary nodules to suggest pulmonary metastatic disease. Progressive right basilar scarring changes and streaky basilar atelectasis. Musculoskeletal: No breast masses are identified. No supraclavicular adenopathy. A few scattered axillary lymph nodes are stable. Suspect scattered subtle slightly sclerotic bone lesions. CT ABDOMEN PELVIS FINDINGS Hepatobiliary: New diffuse hepatic metastatic disease. Numerous small lesions throughout both lobes of the liver. The largest lesion at the right hepatic dome measures 2.5 cm on image 43/2. The gallbladder is surgically absent. No common bile duct dilatation. Pancreas: No mass, inflammation or ductal dilatation. Spleen: Normal size.  No focal lesions. Adrenals/Urinary Tract: Stable right adrenal gland nodule. No worrisome renal lesions are identified without contrast. 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 No complicating features. The small bowel and colon are grossly normal. Vascular/Lymphatic: Stable atherosclerotic calcifications involving the aorta and iliac arteries but no aneurysm. Small scattered mesenteric and retroperitoneal lymph nodes but no mass or overt adenopathy the. Reproductive: Surgically absent. Other: No pelvic mass or adenopathy. No free pelvic fluid collections. No inguinal mass or adenopathy. No abdominal wall hernia or subcutaneous lesions. Musculoskeletal: No lytic destructive bone lesions. No spinal canal compromise. IMPRESSION: 1. Enlarging left upper lobe/suprahilar mass with associated left hilar and mediastinal adenopathy consistent with recurrent lung cancer. 2. New diffuse  hepatic metastatic disease. 3. Suspect scattered subtle slightly sclerotic bone lesions. No lytic or destructive bone lesions. 4. PET-CT may be helpful for accurate staging, if necessary. 5. Stable right adrenal gland nodule. 6. 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 * onc * Aortic Atherosclerosis (ICD10-I70.0). Electronically Signed   By: Marijo Sanes M.D.   On: 07/17/2021 10:58    PERFORMANCE STATUS (ECOG) : 4 - Bedbound  Review of Systems Unless otherwise noted, a complete review of systems is negative.  Physical Exam General: NAD Pulmonary: Unlabored Extremities: no edema, no joint deformities Skin: no rashes Neurological: Weakness but otherwise nonfocal  IMPRESSION: I met with patient and husband.  I later met again with patient, husband, grandson.  Patient's daughter-in-law participated in the second meeting via phone.  With patient and family, we reviewed her current work-up.  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  Patient is interested in pursuing treatment.  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  Unfortunately, her performance status has drastically declined over the past several weeks secondary to acute CVAs.  Family recognize that rehab is likely her best chance at functional improvement and yet SNF disposition may be complicated by plan to pursue active chemotherapy.  Case discussed with [place] PharmD who has ordered patient a 30-day supply of Tagrisso  and hope that this will resolve concerns of rehab facility.  ACP documents on file including living will.  Discussed CODE STATUS with patient and family.  Patient states clearly and repeatedly that she would not want to be resuscitated or have her life prolonged artificial machines.  She was in agreement with DNR/DNI.  Family also verbalized agreement with this decision.  PLAN: -Continue current scope of treatment -DNR/DNI -Recommend  rehab with palliative care following  Case and plan discussed with Dr. Tasia Catchings, Dr. Arbutus Ped, care management, RN, and pharmacy   Time Total: 120 minutes  Visit consisted of counseling and education dealing with the complex and emotionally intense issues of symptom management and palliative care in the setting of serious and potentially life-threatening illness.Greater than 50%  of this time was spent counseling and coordinating care related to the above assessment and plan.  Signed by: Altha Harm, PhD, NP-C

## 2021-07-30 NOTE — Assessment & Plan Note (Addendum)
On 3/7, pt noted to be more lethargic.  Foley had been placed for acute urinary retention.  ?UA consistent with UTI. ?-- Treated with empiric Rocephin ?-- Urine culture grew pansensitive E. Coli ?--Transition to Keflex to complete course of treatment ?--Continue monitoring for urinary retention.  In/out cath if retaining over 400 cc postvoid. ?

## 2021-07-30 NOTE — Assessment & Plan Note (Signed)
See acute embolic stroke ?

## 2021-07-30 NOTE — Assessment & Plan Note (Deleted)
Resumed on Eliquis. ?Continue amiodarone. ?HR controlled. ?

## 2021-07-30 NOTE — Assessment & Plan Note (Addendum)
Stable without signs of exacerbation. ?Pt reported no longer taking Breztri. ?As needed levalbuterol ?

## 2021-07-30 NOTE — Assessment & Plan Note (Signed)
Continue statin. 

## 2021-07-30 NOTE — Assessment & Plan Note (Addendum)
Foley was placed, d/c'd on 3/7. ?Monitor post-void residual with bladder scans.  Patient is voiding however did have 337 cc today 30 minutes postvoid. ? ?Continue in/out catheterizations every 8 hours.  Okay to stop if less than 100 cc drained by cath. ?

## 2021-07-30 NOTE — TOC Progression Note (Signed)
Transition of Care (TOC) - Progression Note  ? ? ?Patient Details  ?Name: Natasha [name] ?MRN: [id number] ?Date of Birth: 1940/06/30 ? ?Transition of Care (TOC) CM/SW Contact  ?Pete Pelt, RN ?Phone Number: ?07/30/2021, 3:40 PM ? ?Clinical Narrative:   RNCM spoke with Sharyn Lull from [facility].  They are now able to make a bed offer for patient, and facility is able to accept patient tomorrow if medically ready for discharge.  RNCM notified care team and family.  Toc to follow. ? ? ? ?  ?  ? ?Expected Discharge Plan and Services ?  ?  ?  ?  ?  ?Expected Discharge Date: 07/28/21               ?  ?  ?  ?  ?  ?  ?  ?  ?  ?  ? ? ?Social Determinants of Health (SDOH) Interventions ?  ? ?Readmission Risk Interventions ?No flowsheet data found. ? ?

## 2021-07-30 NOTE — Consult Note (Signed)
[facility] Nurse wound follow up ?Wound type: pressure injuries ?Measurement: ?Left ischium: Unstageable PI: 6cm x 4cm x 0.2cm; 95% soft brown eshcar/5% pink along wound periphery   ?Left lateral heel: DTPI: 6cm x 2cm x 0cm: resolving deep tissue; non blanchable scattered purple  ?Right heel resolved; sacrum resolved  ?Small area left buttock; looks like abrasion; healing; 100% pink ?Wound bed: see above  ?Drainage (amount, consistency, odor) scant, no odor ?Periwound: intact  ?Dressing procedure/placement/frequency: ?LALM in place for moisture management ?Add enzymatic debridement to the left ischial wound daily, see orders ?Silicone foam left heel ?Offload heels at all times; patient not a candidate for Prevalon boots; sometimes confused and had a fall last evening  ? ?Husband at bedside requested bed alarm not be turned back on; for safety and with recent fall I suggested we turn it back on. Patient self reports she slide out of the edge of the bed between the bed rails, she did not "fall".  Husband again request that the bed alarm not be turned on and that he would be at bedside at all times and if and when he leaves he will notified nursing staff.  I have left bed alarm off per his request and I have notified the charge nurse of same.  ? ?[facility] Nurse team will follow with you and see patient within 10 days for wound assessments.  Please notify [facility] nurses of any acute changes in the wounds or any new areas of concern ?Nayara Taplin [facility] MSN, RN,CWOCN, CNS, CWON-AP ?[phone]  ?  ?

## 2021-07-30 NOTE — Assessment & Plan Note (Addendum)
Appears euvolemic and stable. ?Holding spironolactone & metoprolol, these were stopped  ?PCP follow up to resume if BP will tolerate. ?

## 2021-07-30 NOTE — Assessment & Plan Note (Signed)
Per Oncology and Palliative Care. ?Pt desires treatment. ?

## 2021-07-30 NOTE — Assessment & Plan Note (Addendum)
on CPAP ?

## 2021-07-31 ENCOUNTER — Other Ambulatory Visit (HOSPITAL_COMMUNITY): Payer: Self-pay

## 2021-07-31 DIAGNOSIS — I639 Cerebral infarction, unspecified: Secondary | ICD-10-CM | POA: Diagnosis not present

## 2021-07-31 LAB — COMPREHENSIVE METABOLIC PANEL
ALT: 45 U/L — ABNORMAL HIGH (ref 0–44)
AST: 110 U/L — ABNORMAL HIGH (ref 15–41)
Albumin: 2.3 g/dL — ABNORMAL LOW (ref 3.5–5.0)
Alkaline Phosphatase: 249 U/L — ABNORMAL HIGH (ref 38–126)
Anion gap: 8 (ref 5–15)
BUN: 20 mg/dL (ref 8–23)
CO2: 21 mmol/L — ABNORMAL LOW (ref 22–32)
Calcium: 8.4 mg/dL — ABNORMAL LOW (ref 8.9–10.3)
Chloride: 107 mmol/L (ref 98–111)
Creatinine, Ser: 0.99 mg/dL (ref 0.44–1.00)
GFR, Estimated: 57 mL/min — ABNORMAL LOW (ref >60)
Glucose, Bld: 143 mg/dL — ABNORMAL HIGH (ref 70–99)
Potassium: 4 mmol/L (ref 3.5–5.1)
Sodium: 136 mmol/L (ref 135–145)
Total Bilirubin: 0.5 mg/dL (ref 0.3–1.2)
Total Protein: 4.8 g/dL — ABNORMAL LOW (ref 6.5–8.1)

## 2021-07-31 LAB — MAGNESIUM: Magnesium: 1.8 mg/dL (ref 1.7–2.4)

## 2021-07-31 LAB — CBC
HCT: 26.9 % — ABNORMAL LOW (ref 36.0–46.0)
Hemoglobin: 8.6 g/dL — ABNORMAL LOW (ref 12.0–15.0)
MCH: 28.5 pg (ref 26.0–34.0)
MCHC: 32 g/dL (ref 30.0–36.0)
MCV: 89.1 fL (ref 80.0–100.0)
Platelets: 267 10*3/uL (ref 150–400)
RBC: 3.02 MIL/uL — ABNORMAL LOW (ref 3.87–5.11)
RDW: 17.7 % — ABNORMAL HIGH (ref 11.5–15.5)
WBC: 13.3 10*3/uL — ABNORMAL HIGH (ref 4.0–10.5)
nRBC: 0 % (ref 0.0–0.2)

## 2021-07-31 LAB — GLUCOSE, CAPILLARY
Glucose-Capillary: 136 mg/dL — ABNORMAL HIGH (ref 70–99)
Glucose-Capillary: 156 mg/dL — ABNORMAL HIGH (ref 70–99)
Glucose-Capillary: 91 mg/dL (ref 70–99)

## 2021-07-31 MED ORDER — ADULT MULTIVITAMIN W/MINERALS CH: 1.0000 | ORAL_TABLET | Freq: Every day | ORAL | Status: AC

## 2021-07-31 MED ORDER — CEPHALEXIN 500 MG PO CAPS: 500.0000 mg | ORAL_CAPSULE | Freq: Two times a day (BID) | ORAL | Status: DC

## 2021-07-31 MED ORDER — COLLAGENASE 250 UNIT/GM EX OINT: TOPICAL_OINTMENT | Freq: Every day | CUTANEOUS | 0 refills | Status: AC

## 2021-07-31 MED ORDER — CEPHALEXIN 500 MG PO CAPS
500.0000 mg | ORAL_CAPSULE | Freq: Two times a day (BID) | ORAL | Status: DC
  Administered 2021-07-31: 15:00:00 500 mg via ORAL
  Filled 2021-07-31: qty 1

## 2021-07-31 MED ORDER — FERROUS GLUCONATE 324 (38 FE) MG PO TABS: 324.0000 mg | ORAL_TABLET | Freq: Two times a day (BID) | ORAL | 3 refills | Status: DC

## 2021-07-31 NOTE — Discharge Summary (Addendum)
Physician Discharge Summary   Patient: Natasha [name] MRN: [id number] DOB: 03/26/1941  Admit date:     07/17/2021  Discharge date: 07/31/21  Discharge Physician: Ezekiel Slocumb   PCP: Lesleigh Noe, MD   Recommendations at discharge:   Follow-up with primary care in 1 to 2 weeks Follow-up with Dr. Tasia Catchings, oncologist as as scheduled. Started on oral Tagrisso. Follow-up on resolution of UTI. 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[place])   Bone lesion   Lung cancer metastatic to brain ([place])   Lesion of liver   Brain metastases ([place])   Acute urinary retention   Metastatic cancer to bone [facility])   UTI (urinary tract infection)   Leukocytosis   Thrombocytopenia (HCC)   Paroxysmal atrial fibrillation (HCC)   Type II diabetes mellitus with renal manifestations (HCC)   Iron deficiency anemia   Chronic diastolic CHF (congestive heart failure) (HCC)   Essential hypertension   HLD (hyperlipidemia)   COPD (chronic obstructive pulmonary disease) (HCC)   OSA (obstructive sleep apnea)   Carpal tunnel syndrome on right   Restless leg syndrome   Pressure injury of skin   CKD (chronic kidney disease) stage 4, GFR 15-29 ml/min (HCC)   Palliative care encounter   Depression   Hospital Course: Natasha [name] is a 81 y.o. female with a PMH significant for A-fib on Eliquis, HTN, HLD, type II DM, COPD, asthma, GERD, depression, former smoker, RLS, OSA, CKD 4, CHF, H/o lung cancer s/p radiation therapy, IDA, chronic pain. They presented from home to the ED on 07/17/2021 with dizziness upon waking up on 2/23 and was her normal self when went to bed that previous night. She had worsening of the symptoms and included left leg weakness.   Of note, pt is typically on chronic anticoagulation via Eliquis, however, this medication was held for scheduled right-sided carpal tunnel release, which occurred on 07/15/2021. In the ED, it was found that they had 2 small right cerebellar infarcts on head CT.  This was followed by brain MRI which showed numerous small acute infarcts scattered in the brain and compatible with central embolic disease.  Additionally, this imaging showed multiple bone lesions that were not previously seen on recent head imaging in 02/2021 and are consistent with metastatic disease. Neurology was consulted for further stroke evaluation and treatment.  They were treated with continued holding of Eliquis, permissive hypertension, PT/OT consult.  2/27- lymph node biopsy 2/28- patient had recurrent stroke symptoms and new lesions seen on repeat head CT.  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 3/3- repeat head CT shows expected progression of CVA lesions without new or hemorrhagic conversion. Started on IV heparin gtt without bolus 3/6- patient discharged to SNF but bed offer was revoked due to presumed cost of her oncology therapy 3/7- pt started on Rocephin for UTI 3/8 - palliative consult; oncology started on Tagrisso which can be given at [facility] 3/9 -urine culture shows pansensitive E. coli, transition to Keflex  Assessment and Plan: * Acute embolic stroke [facility]) Present on admission. Recurrence of new lesions and symptoms 2/28.  Pattern is significant for embolic disease in setting of held anticoagulations for recent surgery. Patient denies any new symptoms since 2/28. Repeat head CT 3/3 to  monitor progression of the lesions was negative for hemorrhage or mass effect or new lesions.  Treated with heparin infusion. --Resumed on Eliquis 3/6.   --Neurology consulted, outpatient follow-up as needed --PT/OT/SLP - SNF recommended  Posterior circulation stroke  ([place]) See acute embolic stroke   CVA (cerebral vascular accident) ([place]) See acute embolic stroke  UTI (urinary tract infection) On 3/7, pt noted to be more lethargic.  Foley had been placed for acute urinary retention.  UA consistent with UTI. -- Treated with empiric Rocephin -- Urine culture grew pansensitive E. Coli --Transition to Keflex to complete course of treatment --Continue monitoring for urinary retention.  In/out cath if retaining over 400 cc postvoid.  Metastatic cancer to bone [facility]) Per Oncology and Palliative Care. Pt desires treatment.  Acute urinary retention Foley was placed, d/c'd on 3/7. Monitor post-void residual with bladder scans.  Patient is voiding however did have 337 cc today 30 minutes postvoid.  Continue in/out catheterizations every 8 hours.  Okay to stop if less than 100 cc drained by cath.  Brain metastases [facility]) Per Oncology.    Lesion of liver Metastatic lung ca. management per oncology.  Started on [place].  Lung cancer metastatic to brain [facility]) Management per oncology.  Started on [place].  Palliative care consulted.  Bone lesion History of lung cancer s/p radiation therapy.  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 According to most recent heme/onc notes/discussions, there is not a definitive plan of treatment set yet until complete staining has resulted.  - heme/onc & palliative care consulted  -Started on oral Tagrisso - f/u outpatient  Thrombocytopenia ([place]) Improving.  Possibly related to heparin. Monitor CBC.  Type II diabetes mellitus with renal manifestations (HCC) Hbg A1c <8, moderately well controlled. Treated with insulin during admission (sliding scale Novolog, Semglee for basal). Resume home medications at discharge: [place] and [place]. Continue statin.  Paroxysmal atrial fibrillation (HCC) Resumed on Eliquis. Continue amiodarone. Off metoprolol to prevent  hypotension. HR controlled.   Chronic diastolic CHF (congestive heart failure) (HCC) Appears euvolemic and stable. Holding spironolactone & metoprolol, these were stopped  PCP follow up to resume if BP will tolerate.  Iron deficiency anemia Continue iron supplement. Monitor CBC.  COPD (chronic obstructive pulmonary disease) (HCC) Stable without signs of exacerbation. Pt reported no longer taking Breztri. As needed levalbuterol  HLD (hyperlipidemia) Continue statin  Essential hypertension Now outside permissive hypertension timeframe.  Noted to have soft DBP's.  Holding spironolactone and metoprolol to avoid hypotension.  Resume if/when BP will tolerate.  Carpal tunnel syndrome on right on right s/p release on 2/21.  Ortho evaluated and removed sutures 3/2. Endorses feeling improvement. Follow up outpatient.  OSA (obstructive sleep apnea) on CPAP  Restless leg syndrome Continue home medication  Pressure injury of skin I agree with the wound description as below. --WOC consulted - see wound care instructions --Frequent repositioning --monitor closely for s/sx's of infectino  Pressure Injury 07/23/21 Buttocks Left Deep Tissue Pressure Injury - Purple or maroon localized area of discolored intact skin or blood-filled blister due to damage of underlying soft tissue from pressure and/or shear. (Active)  07/23/21 1044  Location: Buttocks  Location Orientation: Left  Staging: Deep Tissue Pressure Injury - Purple or maroon localized area of discolored intact skin or blood-filled blister due to damage of underlying soft tissue from pressure and/or shear.  Wound Description (Comments):   Present on Admission: Yes     Pressure Injury 07/23/21 Coccyx Mid Stage 2 -  Partial thickness loss of dermis presenting as a shallow open injury with a red, pink wound bed without slough. red (Active)  07/23/21 1045  Location: Coccyx  Location Orientation: Mid  Staging: Stage 2 -  Partial  thickness loss of dermis presenting as a shallow open injury with a red, pink wound bed without slough.  Wound Description (Comments): red  Present on Admission: Yes        Depression Continue home Zoloft   Palliative care encounter See Palliative Consult Note, appreciate assistance.  CKD (chronic kidney disease) stage 4, GFR 15-29 ml/min (HCC) Renal function close to baseline, initially had AKI which resolved.  Monitor BMP.         Consultants: Neurology, oncology, palliative care Procedures performed: None Disposition: Skilled nursing facility Diet recommendation:  Discharge Diet Orders (From admission, onward)     Start     Ordered   07/31/21 0000  Diet - low sodium heart healthy        07/31/21 1432           Cardiac diet DISCHARGE MEDICATION: Allergies as of 07/31/2021       Reactions   Atorvastatin    Muscle/joint aches   Lantus [insulin Glargine] Hives   Lyrica [pregabalin] Other (See Comments)   Headache, disorientation   Lisinopril Hives, Cough           Medication List     STOP taking these medications    amoxicillin 500 MG capsule Commonly known as: AMOXIL   Breztri Aerosphere 160-9-4.8 MCG/ACT Aero Generic drug: Budeson-Glycopyrrol-Formoterol   HYDROcodone-acetaminophen 7.5-325 MG tablet Commonly known as: Norco   metoprolol succinate 25 MG 24 hr tablet Commonly known as: TOPROL-XL   spironolactone 25 MG tablet Commonly known as: ALDACTONE       TAKE these medications    acetaminophen 325 MG tablet Commonly known as: TYLENOL Take 2 tablets (650 mg total) by mouth every 6 (six) hours as needed for mild pain or fever.   amiodarone 200 MG tablet Commonly known as: PACERONE Take 400 mg by mouth daily.   apixaban 5 MG Tabs tablet Commonly known as: Eliquis Take 1 tablet (5 mg total) by mouth 2 (two) times daily.   CALCIUM 600+D3 PO Take 1 tablet by mouth in the morning.   cephALEXin 500 MG capsule Commonly known as:  KEFLEX Take 1 capsule (500 mg total) by mouth every 12 (twelve) hours.   chlorhexidine 0.12 % solution Commonly known as: PERIDEX Rinse mouth with 15 ml for 30 seconds in the morning and evening after brushing. Then spit.   collagenase 250 UNIT/GM ointment Commonly known as: SANTYL Apply topically daily. Start taking on: August 01, 2021   Contour Next Test test strip Generic drug: glucose blood Check sugar twice daily DX E11.69   ferrous gluconate 324 MG tablet Commonly known as: FERGON Take 1 tablet (324 mg total) by mouth 2 (two) times daily with a meal. What changed:  medication strength how much to take when to take this   gabapentin 100 MG capsule Commonly known as: NEURONTIN Take 1 capsule (100 mg total) by mouth 3 (three) times daily as needed. Take one capsule by mouth nightly for one week. Then increase to 2 capsules nightly for one week. Then 3 capsules nightly. What changed:  how much to take when to take this reasons to take this   Janumet 50-1000 MG tablet Generic drug: sitaGLIPtin-metformin Take 2 tablets by mouth in the morning.   Jardiance 25 MG  Tabs tablet Generic drug: empagliflozin Take 25 mg by mouth daily.   levalbuterol 45 MCG/ACT inhaler Commonly known as: XOPENEX HFA Inhale 2 puffs into the lungs every 8 (eight) hours as needed for wheezing or shortness of breath (cough).   multivitamin with minerals Tabs tablet Take 1 tablet by mouth daily. Start taking on: August 01, 2021   osimertinib mesylate 80 MG tablet Commonly known as: TAGRISSO Take 1 tablet (80 mg total) by mouth daily.   pantoprazole 40 MG tablet Commonly known as: PROTONIX Take 40 mg by mouth daily.   polyethylene glycol 17 g packet Commonly known as: MIRALAX / GLYCOLAX Take 17 g by mouth daily.   pramipexole 1 MG tablet Commonly known as: MIRAPEX Take 1 tablet (1 mg total) by mouth at bedtime. Appt with PCP needed for further refills.   rosuvastatin 10 MG  tablet Commonly known as: CRESTOR Take 1 tablet (10 mg total) by mouth at bedtime. What changed:  medication strength how much to take   sertraline 50 MG tablet Commonly known as: ZOLOFT Take 1 tablet (50 mg total) by mouth at bedtime.   Tyler Aas FlexTouch 100 UNIT/ML FlexTouch Pen Generic drug: insulin degludec Inject 15 Units into the skin daily. What changed: when to take this   Vitamin D (Ergocalciferol) 1.25 MG (50000 UNIT) Caps capsule Commonly known as: DRISDOL Take 1 capsule (50,000 Units total) by mouth every 7 (seven) days.               Discharge Care Instructions  (From admission, onward)           Start     Ordered   07/31/21 0000  Discharge wound care:       Comments: As outlined above: Clean left ischial wound with saline, pat dry. Apply quarter inch thick layer of Santyl to the wound bed, top with 2 x 2 moistened (not soaked), topped with foam.  Reapply Santyl daily and change gauze.  Okay to change foam Topper every 3 days.  Silicone foam dressing applied to the left heel and change every 3 days.  Assess dressings each shift and the change if soiled.  Notify medical provider if any acute changes in the appearance of wounds.   07/31/21 1432            Contact information for follow-up providers     Lesleigh Noe, MD Follow up.   Specialty: Family Medicine Why: Hospital follow up after rehab Contact information: [place] [address] [phone]         Kate Sable, MD .   Specialties: Cardiology, Radiology Contact information: [place] [address] [phone]         Earlie Server, MD. Go to.   Specialty: Oncology Why: As scheduled, call if needed to confirm date/time of appointment. Contact information: [place] [address] [phone]              Contact information for after-discharge care     Destination     [facility] Preferred SNF .    Service: Skilled Nursing Contact information: [address] [place] [phone]                    Discharge Exam: Filed Weights   07/26/21 0500 07/27/21 0300 07/28/21 0500  Weight: 64.4 kg 64.4 kg 68 kg   General exam: awake, alert, no acute distress HEENT: atraumatic, clear conjunctiva,  anicteric sclera, moist mucus membranes, hearing grossly normal  Respiratory system: CTAB, no wheezes, rales or rhonchi, normal respiratory effort. Cardiovascular system: normal S1/S2, RRR, no pedal edema.   Gastrointestinal system: soft, nontender abdomen with no suprapubic tenderness Central nervous system: A&O x3. no gross focal neurologic deficits, normal speech Extremities: moves all, no edema, normal tone Skin: dry, intact, normal temperature Psychiatry: normal mood, congruent affect, judgement and insight appear normal   Condition at discharge: stable  The results of significant diagnostics from this hospitalization (including imaging, microbiology, ancillary and laboratory) are listed below for reference.   Imaging Studies: CT HEAD WO CONTRAST (5MM)  Result Date: 07/25/2021 CLINICAL DATA:  81 year old female with recent neurologic deficit and embolic appearing infarcts scattered throughout the brain on 07/22/2021. EXAM: CT HEAD WITHOUT CONTRAST TECHNIQUE: Contiguous axial images were obtained from the base of the skull through the vertex without intravenous contrast. RADIATION DOSE REDUCTION: This exam was performed according to the departmental dose-optimization program which includes automated exposure control, adjustment of the mA and/or kV according to patient size and/or use of iterative reconstruction technique. COMPARISON:  Brain MRI 07/22/2021.  Head CT 07/17/2021. FINDINGS: Brain: Development of cytotoxic edema corresponding to the DWI positive infarcts on the recent MRI, most conspicuous in the left occipital pole (series 2, image 12),  right centrum semiovale (image 18), and cerebellum (image 9). No associated hemorrhage. No mass effect. Stable gray-white matter differentiation otherwise. No ventriculomegaly. Normal basilar cisterns. Vascular: Calcified atherosclerosis at the skull base. No suspicious intracranial vascular hyperdensity. Skull: Osteopenia.  No acute osseous abnormality identified. Sinuses/Orbits: Trace fluid layering in the left sphenoid sinus. Other Visualized paranasal sinuses and mastoids are clear. Other: No acute orbit or scalp soft tissue finding. IMPRESSION: 1. Expected CT appearance of the scattered bilateral infarcts on 07/22/2021 MRI. No associated hemorrhage or mass effect. 2. No new intracranial abnormality. Electronically Signed   By: Genevie Ann M.D.   On: 07/25/2021 08:56   CT Head Wo Contrast  Result Date: 07/17/2021 CLINICAL DATA:  Nonspecific dizziness EXAM: CT HEAD WITHOUT CONTRAST TECHNIQUE: Contiguous axial images were obtained from the base of the skull through the vertex without intravenous contrast. RADIATION DOSE REDUCTION: This exam was performed according to the departmental dose-optimization program which includes automated exposure control, adjustment of the mA and/or kV according to patient size and/or use of iterative reconstruction technique. COMPARISON:  03/09/2021 FINDINGS: Brain: 2 small right cerebellar infarcts not seen on prior, possibly recent and symptomatic. Few remote supratentorial white matter insults. No hemorrhage, hydrocephalus, or collection. Age normal brain volume. Vascular: No hyperdense vessel or unexpected calcification. Skull: Normal. Negative for fracture or focal lesion. Sinuses/Orbits: No acute finding. 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 Electronically Signed   By: Jorje Guild M.D.   On: 07/17/2021 04:32   MR ANGIO HEAD WO CONTRAST  Result Date: 07/17/2021 CLINICAL DATA:  Dizziness,  infarcts on MRI EXAM: MRA NECK WITHOUT CONTRAST MRA HEAD WITHOUT CONTRAST TECHNIQUE: Angiographic images of the Circle of Willis were acquired using MRA technique without intravenous contrast. COMPARISON:  None. FINDINGS: MRA NECK FINDINGS Standard aortic branching. Common, internal, and external carotid arteries are patent, without hemodynamically significant stenosis. Extracranial vertebral arteries are patent, without hemodynamically significant stenosis, although imaging of the origins is somewhat limited by artifact. MRA HEAD FINDINGS Both internal carotid arteries are patent to the termini, without significant stenosis. A1 segments patent. Normal anterior communicating artery. Anterior cerebral arteries are  patent to their distal aspects. No M1 stenosis or occlusion. Normal MCA bifurcations. Distal MCA branches perfused and symmetric. Vertebral arteries patent to the vertebrobasilar junction without stenosis. Basilar patent to its distal aspect. Superior cerebellar arteries patent bilaterally. Patent P1 segments, diminutive on the right. Near fetal origin of the right PCA with patent right posterior communicating artery. PCAs perfused to their distal aspects without stenosis. Possible diminutive left posterior communicating artery. IMPRESSION: 1.  No intracranial large vessel occlusion or significant stenosis. 2.  No hemodynamically significant stenosis in the neck. Electronically Signed   By: Merilyn Baba M.D.   On: 07/17/2021 23:08   MR ANGIO NECK WO CONTRAST  Result Date: 07/17/2021 CLINICAL DATA:  Dizziness, infarcts on MRI EXAM: MRA NECK WITHOUT CONTRAST MRA HEAD WITHOUT CONTRAST TECHNIQUE: Angiographic images of the Circle of Willis were acquired using MRA technique without intravenous contrast. COMPARISON:  None. FINDINGS: MRA NECK FINDINGS Standard aortic branching. Common, internal, and external carotid arteries are patent, without hemodynamically significant stenosis. Extracranial vertebral  arteries are patent, without hemodynamically significant stenosis, although imaging of the origins is somewhat limited by artifact. MRA HEAD FINDINGS Both internal carotid arteries are patent to the termini, without significant stenosis. A1 segments patent. Normal anterior communicating artery. Anterior cerebral arteries are patent to their distal aspects. No M1 stenosis or occlusion. Normal MCA bifurcations. Distal MCA branches perfused and symmetric. Vertebral arteries patent to the vertebrobasilar junction without stenosis. Basilar patent to its distal aspect. Superior cerebellar arteries patent bilaterally. Patent P1 segments, diminutive on the right. Near fetal origin of the right PCA with patent right posterior communicating artery. PCAs perfused to their distal aspects without stenosis. Possible diminutive left posterior communicating artery. IMPRESSION: 1.  No intracranial large vessel occlusion or significant stenosis. 2.  No hemodynamically significant stenosis in the neck. Electronically Signed   By: Merilyn Baba M.D.   On: 07/17/2021 23:08   MR BRAIN WO CONTRAST  Result Date: 07/22/2021 CLINICAL DATA:  Acute neuro deficit. Stroke. Now with bilateral leg weakness and diplopia. EXAM: MRI HEAD WITHOUT CONTRAST TECHNIQUE: Multiplanar, multiecho pulse sequences of the brain and surrounding structures were obtained without intravenous contrast. COMPARISON:  MRI head 07/17/2021 FINDINGS: Brain: Numerous areas of acute infarct are present compatible with emboli. Multiple small infarcts in the cerebellum bilaterally are stable. Progressive acute infarct in the left occipital pole with mild associated petechial hemorrhage which was seen previously. Multiple small areas of acute infarct in the frontal and parietal lobes bilaterally and in the white matter. Progression of cluster of acute infarcts in the right parietal white matter and also in the right medial parietal cortex. Small acute infarct head of  caudate on the left unchanged. Small acute infarct head of caudate on the right is new. Ventricle size normal.  No mass or midline shift. Vascular: Normal arterial flow voids. Skull and upper cervical spine: Bilateral calvarial lesions are again noted. These are suspicious for metastatic disease. Sinuses/Orbits: Paranasal sinuses clear. Bilateral cataract extraction Other: None IMPRESSION: Numerous areas of acute infarct in the cerebrum and cerebellum bilaterally compatible with acute embolic infarction. There has been progression of acute infarcts since the recent MRI of 07/17/2021. Findings suggest recurrent emboli. Progression of infarct left occipital pole. Petechial hemorrhage in this area unchanged from the prior study. No other hemorrhage. Lesions in the calvarium bilaterally, suspicious for metastatic disease. Electronically Signed   By: Franchot Gallo M.D.   On: 07/22/2021 14:59   MR BRAIN WO CONTRAST  Result Date: 07/17/2021 CLINICAL  DATA:  Nonspecific dizziness. EXAM: MRI HEAD WITHOUT CONTRAST TECHNIQUE: Multiplanar, multiecho pulse sequences of the brain and surrounding structures were obtained without intravenous contrast. COMPARISON:  Head CT from earlier today FINDINGS: Brain: Patchy acute infarcts in the bilateral cerebellum and bilateral frontal, parietal, and occipital convexities. Patchy acute infarct in the right more than left centrum semiovale and in the left caudate head. Mild petechial hemorrhage at the right occipital cortex. No hematoma, hydrocephalus, or collection. Vascular: Normal flow voids Skull and upper cervical spine: New scattered bone lesions in the C2 right articular process, right para median clivus tip, and in the bilateral calvarium, affected areas marked on sagittal T2 weighted imaging. Sinuses/Orbits: Negative IMPRESSION: 1. Numerous small acute infarcts scattered in the brain and compatible with central embolic disease. 2. Multiple bone lesions not seen October 2022, a  malignant pattern. Recommend metastatic workup. Electronically Signed   By: Jorje Guild M.D.   On: 07/17/2021 05:42   ECHOCARDIOGRAM COMPLETE  Result Date: 07/17/2021    ECHOCARDIOGRAM REPORT   Patient Name:   [facility] Date of Exam: 07/17/2021 Medical Rec #:  [id number]     Height:       64.0 in Accession #:    [id number]    Weight:       137.0 lb Date of Birth:  21-Dec-1940     BSA:          1.666 m Patient Age:    81 years      BP:           131/66 mmHg Patient Gender: F             HR:           67 bpm. Exam Location:  [facility] Procedure: 2D Echo, Cardiac Doppler and Color Doppler Indications:     Stroke I63.9  History:         Patient has prior history of Echocardiogram examinations, most                  recent 02/25/2021. COPD, Arrythmias:Atrial Fibrillation;                  Signs/Symptoms:Murmur.  Sonographer:     Sherrie Sport Referring Phys:  [id number] Diagnosing Phys: Ida Rogue MD  Sonographer Comments: No parasternal window and suboptimal apical window. Image acquisition challenging due to COPD. IMPRESSIONS  1. Left ventricular ejection fraction, by estimation, is 60 to 65%. The left ventricle has normal function. The left ventricle has no regional wall motion abnormalities. Left ventricular diastolic parameters are consistent with Grade I diastolic dysfunction (impaired relaxation).  2. Right ventricular systolic function is normal. The right ventricular size is normal. There is mildly elevated pulmonary artery systolic pressure. The estimated right ventricular systolic pressure is 65.6 mmHg.  3. The mitral valve is normal in structure. No evidence of mitral valve regurgitation. No evidence of mitral stenosis.  4. The aortic valve is normal in structure. Aortic valve regurgitation is mild to moderate. No aortic stenosis is present.  5. The inferior vena cava is normal in size with greater than 50% respiratory variability, suggesting right atrial pressure of 3 mmHg. FINDINGS  Left Ventricle:  Left ventricular ejection fraction, by estimation, is 60 to 65%. The left ventricle has normal function. The left ventricle has no regional wall motion abnormalities. The left ventricular internal cavity size was normal in size. There is  no left ventricular hypertrophy. Left ventricular diastolic parameters are consistent with Grade  I diastolic dysfunction (impaired relaxation). Right Ventricle: The right ventricular size is normal. No increase in right ventricular wall thickness. Right ventricular systolic function is normal. There is mildly elevated pulmonary artery systolic pressure. The tricuspid regurgitant velocity is 3.00  m/s, and with an assumed right atrial pressure of 5 mmHg, the estimated right ventricular systolic pressure is 41.0 mmHg. Left Atrium: Left atrial size was normal in size. Right Atrium: Right atrial size was normal in size. Pericardium: There is no evidence of pericardial effusion. Mitral Valve: The mitral valve is normal in structure. Mild mitral annular calcification. No evidence of mitral valve regurgitation. No evidence of mitral valve stenosis. MV peak gradient, 5.9 mmHg. The mean mitral valve gradient is 2.0 mmHg. Tricuspid Valve: The tricuspid valve is normal in structure. Tricuspid valve regurgitation is not demonstrated. No evidence of tricuspid stenosis. Aortic Valve: The aortic valve is normal in structure. Aortic valve regurgitation is mild to moderate. No aortic stenosis is present. Aortic valve mean gradient measures 2.5 mmHg. Aortic valve peak gradient measures 4.3 mmHg. Aortic valve area, by VTI measures 3.16 cm. Pulmonic Valve: The pulmonic valve was normal in structure. Pulmonic valve regurgitation is not visualized. No evidence of pulmonic stenosis. Aorta: The aortic root is normal in size and structure. Venous: The inferior vena cava is normal in size with greater than 50% respiratory variability, suggesting right atrial pressure of 3 mmHg. IAS/Shunts: No atrial level  shunt detected by color flow Doppler.  LEFT VENTRICLE PLAX 2D LVIDd:         3.54 cm   Diastology LVIDs:         2.34 cm   LV e' medial:    5.22 cm/s LV PW:         0.98 cm   LV E/e' medial:  12.3 LV IVS:        0.89 cm   LV e' lateral:   7.40 cm/s LVOT diam:     2.00 cm   LV E/e' lateral: 8.7 LV SV:         66 LV SV Index:   40 LVOT Area:     3.14 cm  RIGHT VENTRICLE RV Basal diam:  3.40 cm RV S prime:     14.80 cm/s TAPSE (M-mode): 2.7 cm LEFT ATRIUM             Index        RIGHT ATRIUM           Index LA diam:        3.60 cm 2.16 cm/m   RA Area:     18.20 cm LA Vol (A2C):   91.5 ml 54.93 ml/m  RA Volume:   52.90 ml  31.76 ml/m LA Vol (A4C):   62.7 ml 37.64 ml/m LA Biplane Vol: 77.7 ml 46.64 ml/m  AORTIC VALVE AV Area (Vmax):    2.47 cm AV Area (Vmean):   2.53 cm AV Area (VTI):     3.16 cm AV Vmax:           104.05 cm/s AV Vmean:          68.000 cm/s AV VTI:            0.211 m AV Peak Grad:      4.3 mmHg AV Mean Grad:      2.5 mmHg LVOT Vmax:         81.90 cm/s LVOT Vmean:        54.700 cm/s LVOT VTI:  0.212 m LVOT/AV VTI ratio: 1.00  AORTA Ao Root diam: 2.50 cm MITRAL VALVE                TRICUSPID VALVE MV Area (PHT): 2.76 cm     TR Peak grad:   36.0 mmHg MV Area VTI:   2.13 cm     TR Vmax:        300.00 cm/s MV Peak grad:  5.9 mmHg MV Mean grad:  2.0 mmHg     SHUNTS MV Vmax:       1.21 m/s     Systemic VTI:  0.21 m MV Vmean:      72.7 cm/s    Systemic Diam: 2.00 cm MV Decel Time: 275 msec MV E velocity: 64.30 cm/s MV A velocity: 117.00 cm/s MV E/A ratio:  0.55 Ida Rogue MD Electronically signed by Ida Rogue MD Signature Date/Time: 07/17/2021/4:35:51 PM    Final    [place] CORE BIOPSY (LYMPH NODES)  Result Date: 07/21/2021 INDICATION: 81 year old with history of lung cancer. 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 Plan for ultrasound-guided liver lesion biopsy. Patient also notes a new nodule on the left side of her neck. EXAM:  ULTRASOUND-GUIDED LEFT CERVICAL LYMPH NODE BIOPSY MEDICATIONS: None. ANESTHESIA/SEDATION: None FLUOROSCOPY TIME:  None COMPLICATIONS: None immediate. PROCEDURE: Informed written consent was obtained from the patient after a thorough discussion of the procedural risks, benefits and alternatives. All questions were addressed. A timeout was performed prior to the initiation of the procedure. Liver was thoroughly evaluated with ultrasound. The liver is heterogeneous but a discrete lesion was not identified. Left side of the neck was evaluated with ultrasound and an abnormal small lymph node on the left side of the neck was identified. Left cervical lymph node was targeted for biopsy. The left side of the neck was prepped with chlorhexidine and sterile field was created. Skin was anesthetized with 1% lidocaine. Small incision was made. Using ultrasound guidance, an 18 gauge core device was directed into the lymph node. Four core biopsies were obtained and placed on a Telfa pad with saline. Bandage placed over the puncture site. FINDINGS: Liver is heterogeneous but no discrete lesions could be identified. Therefore, the neck was evaluated for supraclavicular lymphadenopathy. Patient noted a bump on the left side of the neck and there was a rounded small abnormal lymph node at the area of concern. There is also a slightly prominent left supraclavicular lymph node which was not amenable for biopsy. The lymph node in the left mid neck was targeted and biopsied. Biopsy needle was confirmed within the lesion. No immediate bleeding or hematoma formation. IMPRESSION: 1. Ultrasound-guided core biopsy of a small but abnormal looking lymph node on the left side of the neck. 2. Ultrasound-guided liver biopsy was not performed because the liver lesions are not clearly visible on ultrasound. If the neck biopsy is inconclusive or negative, consider further evaluation with PET-CT. CT-guided liver lesion biopsy could be attempted as  well. Electronically Signed   By: Markus Daft M.D.   On: 07/21/2021 15:33   CT CHEST ABDOMEN PELVIS WO CONTRAST  Result Date: 07/17/2021 CLINICAL DATA:  Bone lesions seen on brain MRI. Evaluate for underlying malignancy. History of lung cancer. EXAM: CT CHEST, ABDOMEN AND PELVIS WITHOUT CONTRAST TECHNIQUE: Multidetector CT imaging of the chest, abdomen and pelvis was performed following the standard protocol without IV contrast. RADIATION DOSE REDUCTION: This exam was performed according to the departmental dose-optimization program  which includes automated exposure control, adjustment of the mA and/or kV according to patient size and/or use of iterative reconstruction technique. COMPARISON:  Chest CT 04/10/2021 FINDINGS: CT CHEST FINDINGS Cardiovascular: The heart is normal in size. No pericardial effusion. The aorta is normal in caliber. Stable atherosclerotic calcifications. Remarkably no coronary artery calcifications. Mediastinum/Nodes: Progressive left hilar adenopathy the, difficult to measure without contrast. New subcarinal adenopathy with 12.5 mm node on image 28/2. 8.5 mm right paratracheal node on image 20/2. Lungs/Pleura: Enlarging left upper lobe/suprahilar mass measuring approximately 3 cm on image 33/4. Findings consistent with recurrent lung cancer and left hilar and mediastinal adenopathy. No new pulmonary nodules to suggest pulmonary metastatic disease. Progressive right basilar scarring changes and streaky basilar atelectasis. Musculoskeletal: No breast masses are identified. No supraclavicular adenopathy. A few scattered axillary lymph nodes are stable. Suspect scattered subtle slightly sclerotic bone lesions. CT ABDOMEN PELVIS FINDINGS Hepatobiliary: New diffuse hepatic metastatic disease. Numerous small lesions throughout both lobes of the liver. The largest lesion at the right hepatic dome measures 2.5 cm on image 43/2. The gallbladder is surgically absent. No common bile duct dilatation.  Pancreas: No mass, inflammation or ductal dilatation. Spleen: Normal size.  No focal lesions. Adrenals/Urinary Tract: Stable right adrenal gland nodule. No worrisome renal lesions are identified without contrast. 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 No complicating features. The small bowel and colon are grossly normal. Vascular/Lymphatic: Stable atherosclerotic calcifications involving the aorta and iliac arteries but no aneurysm. Small scattered mesenteric and retroperitoneal lymph nodes but no mass or overt adenopathy the. Reproductive: Surgically absent. Other: No pelvic mass or adenopathy. No free pelvic fluid collections. No inguinal mass or adenopathy. No abdominal wall hernia or subcutaneous lesions. Musculoskeletal: No lytic destructive bone lesions. No spinal canal compromise. IMPRESSION: 1. Enlarging left upper lobe/suprahilar mass with associated left hilar and mediastinal adenopathy consistent with recurrent lung cancer. 2. New diffuse hepatic metastatic disease. 3. Suspect scattered subtle slightly sclerotic bone lesions. No lytic or destructive bone lesions. 4. PET-CT may be helpful for accurate staging, if necessary. 5. Stable right adrenal gland nodule. 6. 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 * onc * Aortic Atherosclerosis (ICD10-I70.0). Electronically Signed   By: Marijo Sanes M.D.   On: 07/17/2021 10:58    Microbiology: Results for orders placed or performed during the hospital encounter of 07/17/21  Resp Panel by RT-PCR (Flu A&B, Covid) Nasopharyngeal Swab     Status: None   Collection Time: 07/17/21  6:57 AM   Specimen: Nasopharyngeal Swab; Nasopharyngeal(NP) swabs in vial transport medium  Result Value Ref Range Status   SARS Coronavirus 2 by RT PCR NEGATIVE NEGATIVE Final    Comment: (NOTE) SARS-CoV-2 target nucleic acids are NOT DETECTED.  The SARS-CoV-2 RNA is generally detectable in upper respiratory specimens during the acute  phase of infection. The lowest concentration of SARS-CoV-2 viral copies this assay can detect is 138 copies/mL. A negative result does not preclude SARS-Cov-2 infection and should not be used as the sole basis for treatment or other patient management decisions. A negative result may occur with  improper specimen collection/handling, submission of specimen other than nasopharyngeal swab, presence of viral mutation(s) within the areas targeted by this assay, and inadequate number of viral copies(<138 copies/mL). A negative result must be combined with clinical observations, patient history, and epidemiological information. The expected result is Negative.  Fact Sheet for Patients:  EntrepreneurPulse.com.au  Fact Sheet for Healthcare Providers:  IncredibleEmployment.be  This test is no t yet  approved or cleared by the [place] and  has been authorized for detection and/or diagnosis of SARS-CoV-2 by FDA under an Emergency Use Authorization (EUA). This EUA will remain  in effect (meaning this test can be used) for the duration of the COVID-19 declaration under Section 564(b)(1) of the Act, 21 U.S.C.section 360bbb-3(b)(1), unless the authorization is terminated  or revoked sooner.       Influenza A by PCR NEGATIVE NEGATIVE Final   Influenza B by PCR NEGATIVE NEGATIVE Final    Comment: (NOTE) The Xpert Xpress SARS-CoV-2/FLU/RSV plus assay is intended as an aid in the diagnosis of influenza from Nasopharyngeal swab specimens and should not be used as a sole basis for treatment. Nasal washings and aspirates are unacceptable for Xpert Xpress SARS-CoV-2/FLU/RSV testing.  Fact Sheet for Patients: EntrepreneurPulse.com.au  Fact Sheet for Healthcare Providers: IncredibleEmployment.be  This test is not yet approved or cleared by the [place] FDA and has been authorized for detection and/or diagnosis of  SARS-CoV-2 by FDA under an Emergency Use Authorization (EUA). This EUA will remain in effect (meaning this test can be used) for the duration of the COVID-19 declaration under Section 564(b)(1) of the Act, 21 U.S.C. section 360bbb-3(b)(1), unless the authorization is terminated or revoked.  Performed at [facility], [address] [place], [place] [address]   Urine Culture     Status: Abnormal   Collection Time: 07/23/21  3:13 AM   Specimen: Urine, Clean Catch  Result Value Ref Range Status   Specimen Description   Final    URINE, CLEAN CATCH Performed at [facility], [address], [place], [place] [address]    Special Requests   Final    NONE Performed at [facility], [place],  [address]    Culture >=100,000 COLONIES/mL ESCHERICHIA COLI (A)  Final   Report Status 07/25/2021 FINAL  Final   Organism ID, Bacteria ESCHERICHIA COLI (A)  Final      Susceptibility   Escherichia coli - MIC*    AMPICILLIN 4 SENSITIVE Sensitive     CEFAZOLIN <=4 SENSITIVE Sensitive     CEFEPIME <=0.12 SENSITIVE Sensitive     CEFTRIAXONE <=0.25 SENSITIVE Sensitive     CIPROFLOXACIN <=0.25 SENSITIVE Sensitive     GENTAMICIN <=1 SENSITIVE Sensitive     IMIPENEM <=0.25 SENSITIVE Sensitive     NITROFURANTOIN <=16 SENSITIVE Sensitive     TRIMETH/SULFA <=20 SENSITIVE Sensitive     AMPICILLIN/SULBACTAM <=2 SENSITIVE Sensitive     PIP/TAZO <=4 SENSITIVE Sensitive     * >=100,000 COLONIES/mL ESCHERICHIA COLI  SARS CORONAVIRUS 2 (TAT 6-24 HRS) Nasopharyngeal Nasopharyngeal Swab     Status: None   Collection Time: 07/27/21 12:30 PM   Specimen: Nasopharyngeal Swab  Result Value Ref Range Status   SARS Coronavirus 2 NEGATIVE NEGATIVE Final    Comment: (NOTE) SARS-CoV-2 target nucleic acids are NOT DETECTED.  The SARS-CoV-2 RNA is generally detectable in upper and lower respiratory specimens during the acute phase of infection. Negative results do  not preclude SARS-CoV-2 infection, do not rule out co-infections with other pathogens, and should not be used as the sole basis for treatment or other patient management decisions. Negative results must be combined with clinical observations, patient history, and epidemiological information. The expected result is Negative.  Fact Sheet for Patients: SugarRoll.be  Fact Sheet for Healthcare Providers: https://www.woods-mathews.com/  This test is not yet approved or cleared by the [place] and  has been authorized  for detection and/or diagnosis of SARS-CoV-2 by FDA under an Emergency Use Authorization (EUA). This EUA will remain  in effect (meaning this test can be used) for the duration of the COVID-19 declaration under Se ction 564(b)(1) of the Act, 21 U.S.C. section 360bbb-3(b)(1), unless the authorization is terminated or revoked sooner.  Performed at [facility], [place] [address], [place], [place] [address]   Urine Culture     Status: Abnormal (Preliminary result)   Collection Time: 07/29/21 11:00 AM   Specimen: Urine, Random  Result Value Ref Range Status   Specimen Description   Final    URINE, RANDOM Performed at [facility], [address] [place], [place] [address]    Special Requests   Final    NONE Performed at [facility], [address] [place], [place] [address]    Culture (A)  Final    >=100,000 COLONIES/mL ESCHERICHIA COLI SUSCEPTIBILITIES TO FOLLOW Performed at [facility], [place] [address], [place], [place] [address]    Report Status PENDING  Incomplete    Labs: CBC: Recent Labs  Lab 07/26/21 0648 07/27/21 0549 07/28/21 0212 07/29/21 1350 07/31/21 0407  WBC 12.4* 15.3* 14.9* 13.9* 13.3*  HGB 8.6* 8.9* 8.5* 8.7* 8.6*  HCT 26.1* 27.4* 26.3* 27.5* 26.9*  MCV 86.7 86.2 86.2 87.6 89.1  PLT 134* 210 244 274 349   Basic Metabolic Panel: Recent Labs  Lab  07/26/21 0648 07/28/21 0739 07/29/21 1350 07/31/21 0407  NA 137 134* 136 136  K 3.8 4.1 4.3 4.0  CL 109 108 107 107  CO2 20* 22 21* 21*  GLUCOSE 194* 160* 106* 143*  BUN 38* 24* 20 20  CREATININE 1.32* 1.12* 1.02* 0.99  CALCIUM 8.1* 8.2* 8.4* 8.4*  MG  --   --   --  1.8   Liver Function Tests: Recent Labs  Lab 07/26/21 0648 07/29/21 1350 07/31/21 0407  AST 151* 112* 110*  ALT 59* 46* 45*  ALKPHOS 215* 217* 249*  BILITOT 0.6 0.7 0.5  PROT 4.4* 4.8* 4.8*  ALBUMIN 2.2* 2.3* 2.3*   CBG: Recent Labs  Lab 07/30/21 0726 07/30/21 1151 07/30/21 1653 07/31/21 0755 07/31/21 1246  GLUCAP 167* 142* 106* 136* 156*    Discharge time spent: greater than 30 minutes.  Signed: Ezekiel Slocumb, DO [facility] 07/31/2021

## 2021-07-31 NOTE — Assessment & Plan Note (Signed)
Per Oncology.   ?

## 2021-07-31 NOTE — Care Management Important Message (Signed)
Important Message ? ?Patient Details  ?Name: Natasha [name] ?MRN: [id number] ?Date of Birth: Jan 23, 1941 ? ? ?Medicare Important Message Given:  Yes ? ? ? ? ?[name] A Jinx Gilden ?07/31/2021, 11:05 AM ?

## 2021-07-31 NOTE — TOC Progression Note (Addendum)
Transition of Care (TOC) - Progression Note  ? ? ?Patient Details  ?Name: Natasha [name] ?MRN: [id number] ?Date of Birth: 1940-06-06 ? ?Transition of Care (TOC) CM/SW Contact  ?Pete Pelt, RN ?Phone Number: ?07/31/2021, 2:25 PM ? ?Clinical Narrative:   Patient discharging to [facility] today, as per MD and Sharyn Lull at facility.  EMS will transport.  ? ? ? ? Addendum:  [id number] patient and family are aware ?  ? ?Expected Discharge Plan and Services ?  ?  ?  ?  ?  ?Expected Discharge Date: 07/28/21               ?  ?  ?  ?  ?  ?  ?  ?  ?  ?  ? ? ?Social Determinants of Health (SDOH) Interventions ?  ? ?Readmission Risk Interventions ?No flowsheet data found. ? ?

## 2021-07-31 NOTE — Progress Notes (Signed)
PT Cancellation Note ? ?Patient Details ?Name: Natasha [name] ?MRN: [id number] ?DOB: 1941-03-19 ? ? ?Cancelled Treatment:    Reason Eval/Treat Not Completed: Patient declined, no reason specified. Patient politely declined PT, stating she did not feel up to it at this time. Spouse at the bedside. Assisted patient with repositioning  in the bed for comfort per the spouse request. PT will continue with attempts as appropriate.  ? ?Natasha [name], PT, MPT ? ?Natasha [name] ?07/31/2021, 2:25 PM ?

## 2021-07-31 NOTE — Assessment & Plan Note (Signed)
Management per oncology.  Started on [place].  Palliative care consulted. ?

## 2021-08-01 ENCOUNTER — Other Ambulatory Visit: Payer: Self-pay

## 2021-08-01 ENCOUNTER — Telehealth: Payer: Self-pay

## 2021-08-01 DIAGNOSIS — C3412 Malignant neoplasm of upper lobe, left bronchus or lung: Secondary | ICD-10-CM

## 2021-08-01 LAB — URINE CULTURE: Culture: >=100000 — AB

## 2021-08-01 NOTE — Telephone Encounter (Signed)
-----  Message from Earlie Server, MD sent at 08/01/2021  1:43 PM EST ----- ?Patient is discharged. ?Recurrent lung cancer, NGS pending. Plan is to start osimertinib if NGS came back positive for the same EGFR mutation or other targetable EGFR mutations.  ?She had UTI and was treated prior to discharge to SNF.  I am off next week.  ?Alyson, if NGS comes back next week and patient remains stable, please advise her to start osimertinib. Otherwise, can wait until I am back ?Vonna Kotyk, please provide supportive care if needed.  ?Please schedule patient to see me on 08/15/21 lab cbc cmp MD + EKG  thanks.  ? ?

## 2021-08-01 NOTE — Telephone Encounter (Signed)
Per staff message from Dr. Tasia Catchings: please schedule patient for lab/MD (EKG) on 08/15/21. Please inform pt of appt. Thanks (cbc,cmp) ?

## 2021-08-04 ENCOUNTER — Inpatient Hospital Stay
Admission: EM | Admit: 2021-08-04 | Discharge: 2021-08-08 | DRG: 542 | Disposition: A | Discharge status: Skilled Nursing Facility | Payer: Medicare Other | Source: Skilled Nursing Facility | Attending: Hospitalist | Admitting: Hospitalist

## 2021-08-04 ENCOUNTER — Emergency Department: Payer: Medicare Other

## 2021-08-04 ENCOUNTER — Inpatient Hospital Stay: Payer: Medicare Other

## 2021-08-04 ENCOUNTER — Other Ambulatory Visit: Payer: Self-pay

## 2021-08-04 DIAGNOSIS — M8458XA Pathological fracture in neoplastic disease, other specified site, initial encounter for fracture: Principal | ICD-10-CM | POA: Diagnosis present

## 2021-08-04 DIAGNOSIS — Z8616 Personal history of COVID-19: Secondary | ICD-10-CM | POA: Diagnosis not present

## 2021-08-04 DIAGNOSIS — M25512 Pain in left shoulder: Secondary | ICD-10-CM | POA: Diagnosis present

## 2021-08-04 DIAGNOSIS — W06XXXA Fall from bed, initial encounter: Secondary | ICD-10-CM | POA: Diagnosis present

## 2021-08-04 DIAGNOSIS — Z9842 Cataract extraction status, left eye: Secondary | ICD-10-CM

## 2021-08-04 DIAGNOSIS — Z66 Do not resuscitate: Secondary | ICD-10-CM | POA: Diagnosis present

## 2021-08-04 DIAGNOSIS — G4733 Obstructive sleep apnea (adult) (pediatric): Secondary | ICD-10-CM | POA: Diagnosis present

## 2021-08-04 DIAGNOSIS — W19XXXA Unspecified fall, initial encounter: Secondary | ICD-10-CM | POA: Diagnosis present

## 2021-08-04 DIAGNOSIS — I480 Paroxysmal atrial fibrillation: Secondary | ICD-10-CM | POA: Diagnosis present

## 2021-08-04 DIAGNOSIS — Z888 Allergy status to other drugs, medicaments and biological substances status: Secondary | ICD-10-CM

## 2021-08-04 DIAGNOSIS — E440 Moderate protein-calorie malnutrition: Secondary | ICD-10-CM | POA: Diagnosis present

## 2021-08-04 DIAGNOSIS — C7951 Secondary malignant neoplasm of bone: Secondary | ICD-10-CM | POA: Diagnosis present

## 2021-08-04 DIAGNOSIS — Z961 Presence of intraocular lens: Secondary | ICD-10-CM | POA: Diagnosis present

## 2021-08-04 DIAGNOSIS — C3412 Malignant neoplasm of upper lobe, left bronchus or lung: Secondary | ICD-10-CM | POA: Diagnosis present

## 2021-08-04 DIAGNOSIS — M79661 Pain in right lower leg: Secondary | ICD-10-CM

## 2021-08-04 DIAGNOSIS — Z794 Long term (current) use of insulin: Secondary | ICD-10-CM

## 2021-08-04 DIAGNOSIS — Z6825 Body mass index (BMI) 25.0-25.9, adult: Secondary | ICD-10-CM

## 2021-08-04 DIAGNOSIS — N390 Urinary tract infection, site not specified: Principal | ICD-10-CM

## 2021-08-04 DIAGNOSIS — L8990 Pressure ulcer of unspecified site, unspecified stage: Secondary | ICD-10-CM | POA: Diagnosis present

## 2021-08-04 DIAGNOSIS — I700 Atherosclerosis of aorta: Secondary | ICD-10-CM | POA: Diagnosis present

## 2021-08-04 DIAGNOSIS — I824Z1 Acute embolism and thrombosis of unspecified deep veins of right distal lower extremity: Secondary | ICD-10-CM | POA: Diagnosis present

## 2021-08-04 DIAGNOSIS — Z9049 Acquired absence of other specified parts of digestive tract: Secondary | ICD-10-CM

## 2021-08-04 DIAGNOSIS — C3490 Malignant neoplasm of unspecified part of unspecified bronchus or lung: Secondary | ICD-10-CM | POA: Diagnosis present

## 2021-08-04 DIAGNOSIS — Z923 Personal history of irradiation: Secondary | ICD-10-CM

## 2021-08-04 DIAGNOSIS — Z87442 Personal history of urinary calculi: Secondary | ICD-10-CM

## 2021-08-04 DIAGNOSIS — I82411 Acute embolism and thrombosis of right femoral vein: Secondary | ICD-10-CM | POA: Diagnosis present

## 2021-08-04 DIAGNOSIS — Z515 Encounter for palliative care: Secondary | ICD-10-CM

## 2021-08-04 DIAGNOSIS — E1122 Type 2 diabetes mellitus with diabetic chronic kidney disease: Secondary | ICD-10-CM | POA: Diagnosis present

## 2021-08-04 DIAGNOSIS — C787 Secondary malignant neoplasm of liver and intrahepatic bile duct: Secondary | ICD-10-CM | POA: Diagnosis present

## 2021-08-04 DIAGNOSIS — G2581 Restless legs syndrome: Secondary | ICD-10-CM | POA: Diagnosis present

## 2021-08-04 DIAGNOSIS — Z90710 Acquired absence of both cervix and uterus: Secondary | ICD-10-CM

## 2021-08-04 DIAGNOSIS — Z9884 Bariatric surgery status: Secondary | ICD-10-CM

## 2021-08-04 DIAGNOSIS — Z803 Family history of malignant neoplasm of breast: Secondary | ICD-10-CM

## 2021-08-04 DIAGNOSIS — I129 Hypertensive chronic kidney disease with stage 1 through stage 4 chronic kidney disease, or unspecified chronic kidney disease: Secondary | ICD-10-CM | POA: Diagnosis present

## 2021-08-04 DIAGNOSIS — W19XXXD Unspecified fall, subsequent encounter: Secondary | ICD-10-CM | POA: Diagnosis not present

## 2021-08-04 DIAGNOSIS — B9620 Unspecified Escherichia coli [E. coli] as the cause of diseases classified elsewhere: Secondary | ICD-10-CM | POA: Diagnosis present

## 2021-08-04 DIAGNOSIS — E11649 Type 2 diabetes mellitus with hypoglycemia without coma: Secondary | ICD-10-CM | POA: Diagnosis present

## 2021-08-04 DIAGNOSIS — Y92122 Bedroom in nursing home as the place of occurrence of the external cause: Secondary | ICD-10-CM | POA: Diagnosis not present

## 2021-08-04 DIAGNOSIS — Z7901 Long term (current) use of anticoagulants: Secondary | ICD-10-CM

## 2021-08-04 DIAGNOSIS — Z79899 Other long term (current) drug therapy: Secondary | ICD-10-CM

## 2021-08-04 DIAGNOSIS — L89626 Pressure-induced deep tissue damage of left heel: Secondary | ICD-10-CM | POA: Diagnosis present

## 2021-08-04 DIAGNOSIS — S32050K Wedge compression fracture of fifth lumbar vertebra, subsequent encounter for fracture with nonunion: Secondary | ICD-10-CM | POA: Diagnosis present

## 2021-08-04 DIAGNOSIS — E7800 Pure hypercholesterolemia, unspecified: Secondary | ICD-10-CM | POA: Diagnosis present

## 2021-08-04 DIAGNOSIS — J440 Chronic obstructive pulmonary disease with acute lower respiratory infection: Secondary | ICD-10-CM | POA: Diagnosis present

## 2021-08-04 DIAGNOSIS — I10 Essential (primary) hypertension: Secondary | ICD-10-CM | POA: Diagnosis present

## 2021-08-04 DIAGNOSIS — F32A Depression, unspecified: Secondary | ICD-10-CM | POA: Diagnosis present

## 2021-08-04 DIAGNOSIS — C7931 Secondary malignant neoplasm of brain: Secondary | ICD-10-CM | POA: Diagnosis present

## 2021-08-04 DIAGNOSIS — L89320 Pressure ulcer of left buttock, unstageable: Secondary | ICD-10-CM | POA: Diagnosis present

## 2021-08-04 DIAGNOSIS — N184 Chronic kidney disease, stage 4 (severe): Secondary | ICD-10-CM | POA: Diagnosis present

## 2021-08-04 DIAGNOSIS — S32050A Wedge compression fracture of fifth lumbar vertebra, initial encounter for closed fracture: Secondary | ICD-10-CM

## 2021-08-04 DIAGNOSIS — Z85118 Personal history of other malignant neoplasm of bronchus and lung: Secondary | ICD-10-CM

## 2021-08-04 DIAGNOSIS — Z87891 Personal history of nicotine dependence: Secondary | ICD-10-CM

## 2021-08-04 DIAGNOSIS — E1129 Type 2 diabetes mellitus with other diabetic kidney complication: Secondary | ICD-10-CM | POA: Diagnosis present

## 2021-08-04 DIAGNOSIS — B3749 Other urogenital candidiasis: Secondary | ICD-10-CM | POA: Diagnosis present

## 2021-08-04 DIAGNOSIS — Z7401 Bed confinement status: Secondary | ICD-10-CM

## 2021-08-04 DIAGNOSIS — Z96612 Presence of left artificial shoulder joint: Secondary | ICD-10-CM | POA: Diagnosis present

## 2021-08-04 DIAGNOSIS — J189 Pneumonia, unspecified organism: Secondary | ICD-10-CM | POA: Diagnosis present

## 2021-08-04 DIAGNOSIS — E162 Hypoglycemia, unspecified: Secondary | ICD-10-CM | POA: Diagnosis present

## 2021-08-04 DIAGNOSIS — L89152 Pressure ulcer of sacral region, stage 2: Secondary | ICD-10-CM | POA: Diagnosis present

## 2021-08-04 DIAGNOSIS — K219 Gastro-esophageal reflux disease without esophagitis: Secondary | ICD-10-CM | POA: Diagnosis present

## 2021-08-04 DIAGNOSIS — Z833 Family history of diabetes mellitus: Secondary | ICD-10-CM

## 2021-08-04 DIAGNOSIS — I639 Cerebral infarction, unspecified: Secondary | ICD-10-CM | POA: Diagnosis present

## 2021-08-04 DIAGNOSIS — Z9841 Cataract extraction status, right eye: Secondary | ICD-10-CM

## 2021-08-04 DIAGNOSIS — Z8673 Personal history of transient ischemic attack (TIA), and cerebral infarction without residual deficits: Secondary | ICD-10-CM

## 2021-08-04 LAB — COMPREHENSIVE METABOLIC PANEL
ALT: 41 U/L (ref 0–44)
AST: 123 U/L — ABNORMAL HIGH (ref 15–41)
Albumin: 2.5 g/dL — ABNORMAL LOW (ref 3.5–5.0)
Alkaline Phosphatase: 286 U/L — ABNORMAL HIGH (ref 38–126)
Anion gap: 7 (ref 5–15)
BUN: 26 mg/dL — ABNORMAL HIGH (ref 8–23)
CO2: 23 mmol/L (ref 22–32)
Calcium: 8.1 mg/dL — ABNORMAL LOW (ref 8.9–10.3)
Chloride: 107 mmol/L (ref 98–111)
Creatinine, Ser: 1.24 mg/dL — ABNORMAL HIGH (ref 0.44–1.00)
GFR, Estimated: 44 mL/min — ABNORMAL LOW (ref >60)
Glucose, Bld: 57 mg/dL — ABNORMAL LOW (ref 70–99)
Potassium: 3.8 mmol/L (ref 3.5–5.1)
Sodium: 137 mmol/L (ref 135–145)
Total Bilirubin: 1 mg/dL (ref 0.3–1.2)
Total Protein: 5.1 g/dL — ABNORMAL LOW (ref 6.5–8.1)

## 2021-08-04 LAB — CBC WITH DIFFERENTIAL/PLATELET
Abs Immature Granulocytes: 0.29 10*3/uL — ABNORMAL HIGH (ref 0.00–0.07)
Basophils Absolute: 0 10*3/uL (ref 0.0–0.1)
Basophils Relative: 0 %
Eosinophils Absolute: 0 10*3/uL (ref 0.0–0.5)
Eosinophils Relative: 0 %
HCT: 28.5 % — ABNORMAL LOW (ref 36.0–46.0)
Hemoglobin: 9 g/dL — ABNORMAL LOW (ref 12.0–15.0)
Immature Granulocytes: 2 %
Lymphocytes Relative: 6 %
Lymphs Abs: 1.1 10*3/uL (ref 0.7–4.0)
MCH: 28 pg (ref 26.0–34.0)
MCHC: 31.6 g/dL (ref 30.0–36.0)
MCV: 88.8 fL (ref 80.0–100.0)
Monocytes Absolute: 1 10*3/uL (ref 0.1–1.0)
Monocytes Relative: 6 %
Neutro Abs: 16.2 10*3/uL — ABNORMAL HIGH (ref 1.7–7.7)
Neutrophils Relative %: 86 %
Platelets: 206 10*3/uL (ref 150–400)
RBC: 3.21 MIL/uL — ABNORMAL LOW (ref 3.87–5.11)
RDW: 18.1 % — ABNORMAL HIGH (ref 11.5–15.5)
WBC: 18.7 10*3/uL — ABNORMAL HIGH (ref 4.0–10.5)
nRBC: 0.1 % (ref 0.0–0.2)

## 2021-08-04 LAB — CK: Total CK: 29 U/L — ABNORMAL LOW (ref 38–234)

## 2021-08-04 LAB — GLUCOSE, CAPILLARY
Glucose-Capillary: 47 mg/dL — ABNORMAL LOW (ref 70–99)
Glucose-Capillary: 126 mg/dL — ABNORMAL HIGH (ref 70–99)
Glucose-Capillary: 107 mg/dL — ABNORMAL HIGH (ref 70–99)

## 2021-08-04 LAB — URINALYSIS, COMPLETE (UACMP) WITH MICROSCOPIC
Bacteria, UA: NONE SEEN
Bilirubin Urine: NEGATIVE
Glucose, UA: >=500 mg/dL — AB
Ketones, ur: 5 mg/dL — AB
Nitrite: NEGATIVE
Protein, ur: 30 mg/dL — AB
Specific Gravity, Urine: 1.018 (ref 1.005–1.030)
WBC, UA: >50 WBC/hpf — ABNORMAL HIGH (ref 0–5)
pH: 5 (ref 5.0–8.0)

## 2021-08-04 LAB — LACTIC ACID, PLASMA
Lactic Acid, Venous: 1.1 mmol/L (ref 0.5–1.9)
Lactic Acid, Venous: 0.8 mmol/L (ref 0.5–1.9)

## 2021-08-04 LAB — PROTIME-INR
INR: 2.2 — ABNORMAL HIGH (ref 0.8–1.2)
Prothrombin Time: 24.6 seconds — ABNORMAL HIGH (ref 11.4–15.2)

## 2021-08-04 LAB — APTT: aPTT: 37 seconds — ABNORMAL HIGH (ref 24–36)

## 2021-08-04 LAB — MRSA NEXT GEN BY PCR, NASAL: MRSA by PCR Next Gen: NOT DETECTED

## 2021-08-04 LAB — RESP PANEL BY RT-PCR (FLU A&B, COVID) ARPGX2
Influenza A by PCR: NEGATIVE
Influenza B by PCR: NEGATIVE
SARS Coronavirus 2 by RT PCR: NEGATIVE

## 2021-08-04 MED ORDER — LIDOCAINE 5 % EX PTCH
1.0000 | MEDICATED_PATCH | CUTANEOUS | Status: DC
  Administered 2021-08-04 – 2021-08-07 (×4): 1 via TRANSDERMAL
  Filled 2021-08-04 (×5): qty 1

## 2021-08-04 MED ORDER — DEXTROSE-NACL 5-0.9 % IV SOLN: INTRAVENOUS | Status: DC

## 2021-08-04 MED ORDER — POLYETHYLENE GLYCOL 3350 17 G PO PACK
17.0000 g | PACK | Freq: Every day | ORAL | Status: DC
  Administered 2021-08-04 – 2021-08-08 (×5): 17 g via ORAL
  Filled 2021-08-04 (×5): qty 1

## 2021-08-04 MED ORDER — ONDANSETRON HCL 4 MG PO TABS
4.0000 mg | ORAL_TABLET | Freq: Four times a day (QID) | ORAL | Status: DC | PRN
  Administered 2021-08-05: 4 mg via ORAL
  Filled 2021-08-04: qty 1

## 2021-08-04 MED ORDER — COLLAGENASE 250 UNIT/GM EX OINT
TOPICAL_OINTMENT | Freq: Every day | CUTANEOUS | Status: DC
  Filled 2021-08-04 (×2): qty 30

## 2021-08-04 MED ORDER — SERTRALINE HCL 50 MG PO TABS
50.0000 mg | ORAL_TABLET | Freq: Every day | ORAL | Status: DC
  Administered 2021-08-04 – 2021-08-07 (×4): 50 mg via ORAL
  Filled 2021-08-04 (×4): qty 1

## 2021-08-04 MED ORDER — FERROUS SULFATE 325 (65 FE) MG PO TABS
325.0000 mg | ORAL_TABLET | Freq: Two times a day (BID) | ORAL | Status: DC
  Administered 2021-08-04 – 2021-08-08 (×7): 325 mg via ORAL
  Filled 2021-08-04 (×8): qty 1

## 2021-08-04 MED ORDER — GABAPENTIN 100 MG PO CAPS
100.0000 mg | ORAL_CAPSULE | Freq: Every day | ORAL | Status: DC
  Administered 2021-08-04 – 2021-08-07 (×4): 100 mg via ORAL
  Filled 2021-08-04 (×4): qty 1

## 2021-08-04 MED ORDER — HYDROCODONE-ACETAMINOPHEN 5-325 MG PO TABS
1.0000 | ORAL_TABLET | ORAL | Status: DC | PRN
  Administered 2021-08-04 – 2021-08-07 (×9): 1 via ORAL
  Filled 2021-08-04 (×9): qty 1

## 2021-08-04 MED ORDER — ADULT MULTIVITAMIN W/MINERALS CH
1.0000 | ORAL_TABLET | Freq: Every day | ORAL | Status: DC
  Administered 2021-08-04 – 2021-08-08 (×5): 1 via ORAL
  Filled 2021-08-04 (×5): qty 1

## 2021-08-04 MED ORDER — CHLORHEXIDINE GLUCONATE 0.12 % MT SOLN
15.0000 mL | Freq: Two times a day (BID) | OROMUCOSAL | Status: DC
  Administered 2021-08-04 – 2021-08-08 (×6): 15 mL via OROMUCOSAL
  Filled 2021-08-04 (×6): qty 15

## 2021-08-04 MED ORDER — ACETAMINOPHEN 325 MG PO TABS: 650.0000 mg | ORAL_TABLET | Freq: Four times a day (QID) | ORAL | Status: DC | PRN

## 2021-08-04 MED ORDER — VITAMIN D (ERGOCALCIFEROL) 1.25 MG (50000 UNIT) PO CAPS
50000.0000 [IU] | ORAL_CAPSULE | ORAL | Status: DC
  Administered 2021-08-04: 50000 [IU] via ORAL
  Filled 2021-08-04: qty 1

## 2021-08-04 MED ORDER — LEVALBUTEROL HCL 1.25 MG/0.5ML IN NEBU: 1.2500 mg | INHALATION_SOLUTION | Freq: Three times a day (TID) | RESPIRATORY_TRACT | Status: DC | PRN

## 2021-08-04 MED ORDER — ASCORBIC ACID 500 MG PO TABS
500.0000 mg | ORAL_TABLET | Freq: Every day | ORAL | Status: DC
  Administered 2021-08-04 – 2021-08-08 (×5): 500 mg via ORAL
  Filled 2021-08-04 (×5): qty 1

## 2021-08-04 MED ORDER — MEDIHONEY WOUND/BURN DRESSING EX PSTE
1.0000 "application " | PASTE | Freq: Every day | CUTANEOUS | Status: DC
  Administered 2021-08-05 – 2021-08-06 (×3): 1 via TOPICAL
  Filled 2021-08-04 (×2): qty 44

## 2021-08-04 MED ORDER — APIXABAN 5 MG PO TABS
5.0000 mg | ORAL_TABLET | Freq: Two times a day (BID) | ORAL | Status: DC
Start: 2021-08-04 — End: 2021-08-08
  Administered 2021-08-04 – 2021-08-07 (×7): 5 mg via ORAL
  Filled 2021-08-04 (×7): qty 1

## 2021-08-04 MED ORDER — ZINC SULFATE 220 (50 ZN) MG PO CAPS
220.0000 mg | ORAL_CAPSULE | Freq: Every day | ORAL | Status: DC
  Administered 2021-08-04 – 2021-08-08 (×5): 220 mg via ORAL
  Filled 2021-08-04 (×5): qty 1

## 2021-08-04 MED ORDER — AMIODARONE HCL 200 MG PO TABS
400.0000 mg | ORAL_TABLET | Freq: Every day | ORAL | Status: DC
  Administered 2021-08-04 – 2021-08-08 (×5): 400 mg via ORAL
  Filled 2021-08-04 (×5): qty 2

## 2021-08-04 MED ORDER — PIPERACILLIN-TAZOBACTAM 3.375 G IVPB
3.3750 g | Freq: Three times a day (TID) | INTRAVENOUS | Status: DC
  Administered 2021-08-04 – 2021-08-06 (×7): 3.375 g via INTRAVENOUS
  Filled 2021-08-04 (×7): qty 50

## 2021-08-04 MED ORDER — CEFTRIAXONE SODIUM 1 G IJ SOLR
1.0000 g | INTRAMUSCULAR | Status: DC
Start: 2021-08-05 — End: 2021-08-04
  Filled 2021-08-04: qty 10

## 2021-08-04 MED ORDER — SODIUM CHLORIDE 0.9 % IV BOLUS
500.0000 mL | Freq: Once | INTRAVENOUS | Status: AC
Start: 2021-08-04 — End: 2021-08-04
  Administered 2021-08-04: 500 mL via INTRAVENOUS

## 2021-08-04 MED ORDER — ONDANSETRON HCL 4 MG/2ML IJ SOLN
4.0000 mg | Freq: Once | INTRAMUSCULAR | Status: AC
Start: 2021-08-04 — End: 2021-08-04
  Administered 2021-08-04: 4 mg via INTRAVENOUS
  Filled 2021-08-04: qty 2

## 2021-08-04 MED ORDER — OYSTER SHELL CALCIUM/D3 500-5 MG-MCG PO TABS
ORAL_TABLET | Freq: Every morning | ORAL | Status: DC
  Administered 2021-08-05 – 2021-08-08 (×3): 1 via ORAL
  Filled 2021-08-04 (×3): qty 1

## 2021-08-04 MED ORDER — SODIUM CHLORIDE 0.9 % IV SOLN
1.0000 g | Freq: Once | INTRAVENOUS | Status: AC
  Administered 2021-08-04: 1 g via INTRAVENOUS
  Filled 2021-08-04: qty 10

## 2021-08-04 MED ORDER — ONDANSETRON HCL 4 MG/2ML IJ SOLN: 4.0000 mg | Freq: Four times a day (QID) | INTRAMUSCULAR | Status: DC | PRN

## 2021-08-04 MED ORDER — MORPHINE SULFATE (PF) 2 MG/ML IV SOLN
2.0000 mg | Freq: Once | INTRAVENOUS | Status: AC
  Administered 2021-08-04: 2 mg via INTRAVENOUS
  Filled 2021-08-04: qty 1

## 2021-08-04 MED ORDER — ROSUVASTATIN CALCIUM 10 MG PO TABS
10.0000 mg | ORAL_TABLET | Freq: Every day | ORAL | Status: DC
  Administered 2021-08-04 – 2021-08-07 (×4): 10 mg via ORAL
  Filled 2021-08-04 (×4): qty 1

## 2021-08-04 MED ORDER — PANTOPRAZOLE SODIUM 40 MG PO TBEC
40.0000 mg | DELAYED_RELEASE_TABLET | Freq: Every day | ORAL | Status: DC
  Administered 2021-08-04 – 2021-08-08 (×5): 40 mg via ORAL
  Filled 2021-08-04 (×5): qty 1

## 2021-08-04 MED ORDER — OSIMERTINIB MESYLATE 80 MG PO TABS: 80.0000 mg | ORAL_TABLET | Freq: Every day | ORAL | Status: DC

## 2021-08-04 NOTE — Assessment & Plan Note (Addendum)
Secondary to poor oral intake Hold oral hypoglycemic agents --Ensure mixed with milk

## 2021-08-04 NOTE — Assessment & Plan Note (Signed)
Continue sertraline 

## 2021-08-04 NOTE — Assessment & Plan Note (Addendum)
Patient noted to have deep tissue injury to the buttocks and stage II pressure injury involving the coccyx. Frequent turning to prevent development of further pressure ulcers Continue to apply barrier cream to buttocks Continue zinc oxide and ascorbic acid to help with wound healing  Pressure Injury 08/04/21 Buttocks Left Unstageable - Full thickness tissue loss in which the base of the injury is covered by slough (yellow, tan, gray, green or brown) and/or eschar (tan, brown or black) in the wound bed. covered in escar and red around (Active)  08/04/21 1044  Location: Buttocks  Location Orientation: Left  Staging: Unstageable - Full thickness tissue loss in which the base of the injury is covered by slough (yellow, tan, gray, green or brown) and/or eschar (tan, brown or black) in the wound bed.  Wound Description (Comments): covered in escar and red around wound  Present on Admission: Yes

## 2021-08-04 NOTE — ED Notes (Signed)
Pt here with chronic foley catheter. Urine dark in color. EDP made aware. ?

## 2021-08-04 NOTE — ED Provider Notes (Signed)
? ?[facility] ?Provider Note ? ? ? Event Date/Time  ? First MD Initiated Contact with Patient 08/04/21 1330   ?  (approximate) ? ? ?History  ? ?Weakness ? ? ?HPI ? ?Natasha [name] is a 81 y.o. female with a history of atrial fibrillation, CKD, COPD, CVA, lung cancer who presents with generalized weakness fatigue, fall with low back pain, pelvic pain ? ?Patient reports Foley catheter was placed at facility because she was having frequent urination.  She reports feeling increased weakness and low energy.  Positive chills ? ?Did have a fall yesterday, complains of pain in her low back, no numbness or weakness in her legs reportedly ?  ? ? ?Physical Exam  ? ?Triage Vital Signs: ?ED Triage Vitals  ?Enc Vitals Group  ?   BP 08/04/21 1009 130/69  ?   Pulse Rate 08/04/21 1009 86  ?   Resp 08/04/21 1009 16  ?   Temp 08/04/21 1009 97.9 ?F (36.6 ?C)  ?   Temp Source 08/04/21 1009 Oral  ?   SpO2 08/04/21 1009 98 %  ?   Weight 08/04/21 1011 68 kg (149 lb 14.6 oz)  ?   Height 08/04/21 1011 1.626 m ($Remove'5\' 4"'ZibrHcD$ )  ?   Head Circumference --   ?   Peak Flow --   ?   Pain Score 08/04/21 1010 8  ?   Pain Loc --   ?   Pain Edu? --   ?   Excl. in [place]? --   ? ? ?Most recent vital signs: ?Vitals:  ? 08/04/21 1130 08/04/21 1505  ?BP: 112/63 115/74  ?Pulse: 84 86  ?Resp: 17 16  ?Temp:    ?SpO2: 96% 97%  ? ? ? ?General: Awake, no distress.  ?CV:  Good peripheral perfusion.  ?Resp:  Normal effort.  ?Abd:  No distention.  ?Other:  Normal strength in the lower extremities, warm and well-perfused distally ?Foley catheter noted, dark yellow urine ? ? ?ED Results / Procedures / Treatments  ? ?Labs ?(all labs ordered are listed, but only abnormal results are displayed) ?Labs Reviewed  ?COMPREHENSIVE METABOLIC PANEL - Abnormal; Notable for the following components:  ?    Result Value  ? Glucose, Bld 57 (*)   ? BUN 26 (*)   ? Creatinine, Ser 1.24 (*)   ? Calcium 8.1 (*)   ? Total Protein 5.1 (*)   ? Albumin 2.5 (*)   ? AST 123 (*)   ?  Alkaline Phosphatase 286 (*)   ? GFR, Estimated 44 (*)   ? All other components within normal limits  ?CBC WITH DIFFERENTIAL/PLATELET - Abnormal; Notable for the following components:  ? WBC 18.7 (*)   ? RBC 3.21 (*)   ? Hemoglobin 9.0 (*)   ? HCT 28.5 (*)   ? RDW 18.1 (*)   ? Neutro Abs 16.2 (*)   ? Abs Immature Granulocytes 0.29 (*)   ? All other components within normal limits  ?PROTIME-INR - Abnormal; Notable for the following components:  ? Prothrombin Time 24.6 (*)   ? INR 2.2 (*)   ? All other components within normal limits  ?APTT - Abnormal; Notable for the following components:  ? aPTT 37 (*)   ? All other components within normal limits  ?URINALYSIS, COMPLETE (UACMP) WITH MICROSCOPIC - Abnormal; Notable for the following components:  ? Color, Urine YELLOW (*)   ? APPearance TURBID (*)   ? Glucose, UA >=500 (*)   ?  Hgb urine dipstick LARGE (*)   ? Ketones, ur 5 (*)   ? Protein, ur 30 (*)   ? Leukocytes,Ua LARGE (*)   ? WBC, UA >50 (*)   ? All other components within normal limits  ?RESP PANEL BY RT-PCR (FLU A&B, COVID) [id number]  ?CULTURE, BLOOD (ROUTINE X 2)  ?CULTURE, BLOOD (ROUTINE X 2)  ?URINE CULTURE  ?MRSA NEXT GEN BY PCR, NASAL  ?LACTIC ACID, PLASMA  ?LACTIC ACID, PLASMA  ?CK  ? ? ? ?EKG ?ED ECG REPORT ?I, Lavonia Drafts, the attending physician, personally viewed and interpreted this ECG. ? ?Date: 08/04/2021 ? ?Rhythm: normal sinus rhythm ?QRS Axis: normal ?Intervals: normal ?ST/T Wave abnormalities: normal ?Narrative Interpretation: no evidence of acute ischemia ? ? ? ? ?RADIOLOGY ?Chest x-ray viewed by me, no acute abnormality ?Lumbar spine x-ray demonstrates likely acute compression fracture ? ? ? ?PROCEDURES: ? ?Critical Care performed:  ? ?Procedures ? ? ?MEDICATIONS ORDERED IN ED: ?Medications  ?dextrose 5 %-0.9 % sodium chloride infusion (has no administration in time range)  ?acetaminophen (TYLENOL) tablet 650 mg (has no administration in time range)  ?amiodarone (PACERONE) tablet 400 mg (has  no administration in time range)  ?rosuvastatin (CRESTOR) tablet 10 mg (has no administration in time range)  ?sertraline (ZOLOFT) tablet 50 mg (has no administration in time range)  ?pantoprazole (PROTONIX) EC tablet 40 mg (has no administration in time range)  ?polyethylene glycol (MIRALAX / GLYCOLAX) packet 17 g (has no administration in time range)  ?apixaban (ELIQUIS) tablet 5 mg (has no administration in time range)  ?ferrous sulfate tablet 325 mg (has no administration in time range)  ?gabapentin (NEURONTIN) capsule 100 mg (has no administration in time range)  ?calcium-vitamin D (OSCAL WITH D) 500-5 MG-MCG per tablet (has no administration in time range)  ?multivitamin with minerals tablet 1 tablet (has no administration in time range)  ?Vitamin D (Ergocalciferol) (DRISDOL) capsule 50,000 Units (has no administration in time range)  ?levalbuterol (XOPENEX) nebulizer solution 1.25 mg (has no administration in time range)  ?collagenase (SANTYL) ointment (has no administration in time range)  ?chlorhexidine (PERIDEX) 0.12 % solution 15 mL (has no administration in time range)  ?HYDROcodone-acetaminophen (NORCO/VICODIN) 5-325 MG per tablet 1 tablet (has no administration in time range)  ?ondansetron (ZOFRAN) tablet 4 mg (has no administration in time range)  ?  Or  ?ondansetron (ZOFRAN) injection 4 mg (has no administration in time range)  ?cefTRIAXone (ROCEPHIN) 1 g in sodium chloride 0.9 % 100 mL IVPB (has no administration in time range)  ?zinc sulfate capsule 220 mg (has no administration in time range)  ?ascorbic acid (VITAMIN C) tablet 500 mg (has no administration in time range)  ?morphine (PF) 2 MG/ML injection 2 mg (2 mg Intravenous Given 08/04/21 1115)  ?ondansetron (ZOFRAN) injection 4 mg (4 mg Intravenous Given 08/04/21 1115)  ?sodium chloride 0.9 % bolus 500 mL (0 mLs Intravenous Stopped 08/04/21 1340)  ?cefTRIAXone (ROCEPHIN) 1 g in sodium chloride 0.9 % 100 mL IVPB (0 g Intravenous Stopped 08/04/21  1422)  ? ? ? ?IMPRESSION / MDM / ASSESSMENT AND PLAN / ED COURSE  ?I reviewed the triage vital signs and the nursing notes. ? ?Patient presents with multiple complaints as detailed above.  Given recent placement of Foley suspicious for urinary tract infection.  Labs pending. ? ?Pelvic x-ray is reassuring however lumbar x-ray is consistent with compression fracture likely acute.  No new neurodeficits assessed ? ?Lab work notable for white blood cell count of 18.7, BUN/creatinine  consistent with dehydration.  Urinalysis consistent with UTI ? ?We will start the patient on Rocephin, have consulted the hospitalist for admission. ? ?Consulted Dr. Cari Caraway of neurosurgery for compression fracture ? ? ? ? ? ?  ? ? ?FINAL CLINICAL IMPRESSION(S) / ED DIAGNOSES  ? ?Final diagnoses:  ?Lower urinary tract infectious disease  ?Compression fracture of L5 vertebra, initial encounter ([place])  ? ? ? ?Rx / DC Orders  ? ?ED Discharge Orders   ? ? None  ? ?  ? ? ? ?Note:  This document was prepared using Dragon voice recognition software and may include unintentional dictation errors. ?  ?Lavonia Drafts, MD ?08/04/21 1519 ? ?

## 2021-08-04 NOTE — ED Triage Notes (Signed)
Pt here from [facility] with weakness via [facility]. Pt has lung CA, COPD, kidney disease. Pt is hot to touch. Pt has catheter with dark urine. Pt normally A&O but unable to answer oriention questions at this moment. ? ? ?124/60 ?84 ?91%--97% on 2L ?76-cbg  ?

## 2021-08-04 NOTE — Assessment & Plan Note (Addendum)
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

## 2021-08-04 NOTE — Assessment & Plan Note (Signed)
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 She appears to be hypoglycemic and has had poor oral intake Hold oral hypoglycemic agents Hydrate patient with dextrose containing fluids Check blood sugars every 4 hours

## 2021-08-04 NOTE — Consult Note (Signed)
Pharmacy Antibiotic Note ? ?Natasha [name] is a 81 y.o. female admitted on 08/04/2021 with pneumonia.  Pharmacy has been consulted for Zosyn dosing. ? ?Plan: ?Zosyn 3.375g IV q8h (4 hour infusion). ? ?Height: $RemoveBe'5\' 4"'ntXXHBUFz$  (162.6 cm) ?Weight: 68 kg (149 lb 14.6 oz) ?IBW/kg (Calculated) : 54.7 ? ?Temp (24hrs), Avg:98.2 ?F (36.8 ?C), Min:97.9 ?F (36.6 ?C), Max:98.4 ?F (36.9 ?C) ? ?Recent Labs  ?Lab 07/29/21 ?1350 07/31/21 ?0407 08/04/21 ?1014 08/04/21 ?1649  ?WBC 13.9* 13.3* 18.7*  --   ?CREATININE 1.02* 0.99 1.24*  --   ?LATICACIDVEN  --   --  1.1 0.8  ?  ?Estimated Creatinine Clearance: 33.7 mL/min (A) (by C-G formula based on SCr of 1.24 mg/dL (H)).   ? ?Allergies  ?Allergen Reactions  ? Atorvastatin   ?  Muscle/joint aches  ? Lantus [Insulin Glargine] Hives  ? Lyrica [Pregabalin] Other (See Comments)  ?  Headache, disorientation  ? Lisinopril Hives and Cough  ?   ?  ? ? ?Antimicrobials this admission: ?Zosyn 3/13 >>  ? ?Dose adjustments this admission: ?N/A ? ?Microbiology results: ?3/13 BCx: sent ?3/13 UCx: sent  ?3/13 MRSA PCR: sent ? ?Thank you for allowing pharmacy to be a part of this patient?s care. ? ?Pearla Dubonnet ?08/04/2021 5:28 PM ? ?

## 2021-08-04 NOTE — Consult Note (Signed)
WOC Nurse Consult Note: ?Reason for Consult:Pressure injuries to left lateral heel and left ischial tuberosity. Patient last seen for these injuries during her last admission on 07/30/21 (5 days ago) ?Wound type:Pressure ?Pressure Injury POA: Yes ?Measurement: ?Left ischial tuberosity:  Unstageable pressure injury measuring 6cm x 4cm with depth obscured by nonviable tissue ?Left heel: DTPI in evolution measuring 6cm x 2cm of scattered purple discolorations ?Wound bed: As described above ?Drainage (amount, consistency, odor) Small ?Periwound:with evidence of previous wound healing, tissue repair and regeneration ?Dressing procedure/placement/frequency: ? ?I will implement several components of the POC from last week including the mattress replacement with low air loss feature. Off loading of heels will be with elevation as the patient is not a candidate for pressure redistribution heel boots given her recent falls.The left heel resolving DTPI will be dressed with a silicone foam dressing. The left ischial tuberosity with nonviable tissue (Unstageable) will be dressed daily with Manuka Honey ([place]) rather than collagenase/Santyl during this admission in an attempt to autolytically remove the nonviable tissue via the antimicrobial vehicle. ? ?Turning and repositioning is in place and will continue, time in the supine position is to be minimized. ? ?I communicated my recommendations for the POC to Dr. Francine Graven via [place]. ? ?[place] nursing team will not follow, but will remain available to this patient, the nursing and medical teams.  Please re-consult if needed. ?Thanks, ?Maudie Flakes, MSN, RN, [place], [place], CWON-AP, [place]  ?Pager# [phone]  ? ? ? ?  ?

## 2021-08-04 NOTE — Assessment & Plan Note (Signed)
Patient is normotensive Monitor blood pressure closely

## 2021-08-04 NOTE — Assessment & Plan Note (Addendum)
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 Patient was seen by oncology during her last hospitalization who recommended osimertinib mesylate.  Patient is yet to start this medication. CT scan of the chest shows left upper lobe/suprahilar mass with obstruction of the left upper lobe bronchus and worsening left upper lobe consolidation, likely combination of tumor and postobstructive pneumonia and/or atelectasis. We will start patient on Zosyn

## 2021-08-04 NOTE — H&P (Addendum)
History and Physical    Patient: Natasha [name] WNU:[id number] DOB: November 12, 1940 DOA: 08/04/2021 DOS: the patient was seen and examined on 08/04/2021 PCP: Lesleigh Noe, MD  Patient coming from: SNF  Chief Complaint:  Chief Complaint  Patient presents with   Weakness    Most of the history was obtained from patient's husband at the bedside HPI: Natasha [name] is a 81 y.o. female with medical history significant for recent acute embolic CVA, bedbound status, hypertension, A-fib on chronic anticoagulation therapy, diabetes mellitus type 2, GERD, depression, history of metastatic lung cancer status post radiation therapy who presents to the ER from the skilled nursing facility where she was recently discharged for evaluation after she fell. Patient's husband states that she has been unable to ambulate since her discharge from the hospital and the nursing home staff had to place a Foley catheter because it was uncomfortable for her to void using a bedpan.  Her oral intake has been very poor and she has been extremely weak. Patient states that she fell while attempting to get out of bed this morning and denies feeling dizzy or lightheaded.  She denies hitting her head or having any loss of consciousness. She complains of pain in her lower back but denies having any headache, no neck pain, no abdominal pain, no changes in her bowel habits, no chest pain, no palpitations, no shortness of breath, no nausea, no vomiting, no leg swelling. She was noted to have very dark urine upon arrival to the ER. Per ER provider, lumbar spine x-ray shows an L5 compression fracture. She will be admitted to the hospital for further evaluation  Review of Systems:As mentioned in the history of present illness. All other systems reviewed and are negative.  Past Medical History:  Diagnosis Date   A-fib [facility])    a.) CHA2DS2-VASc Score = 6 (age x 2, sex, HTN, aortic plaque, T2DM). 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   Chronic cough    CKD (chronic kidney disease), stage III (HCC)    Complication of anesthesia    COPD (chronic obstructive pulmonary disease) (HCC)    Depression    Diastolic dysfunction    a.) TTE 04/08/2014: EF 60%; mild concentric LVH; G2DD. b.) TTE 12/21/2019: EF 60-65%, LA mildly dilated, mild-mod MR; PASP 36.8; G1DD. c.) TTE 02/25/2021: EF 60-65%; normal LV function with mild concentric LVH; G1DD   Diverticulitis    Dyspnea    High cholesterol    History of 2019 novel coronavirus disease (COVID-19) 02/24/2021   History of hiatal hernia    History of kidney stones    Hypertension    Insomnia    Long term current use of anticoagulant    a.) apixaban   Lumbar spinal stenosis    Mild asthma    Murmur    Non-small cell carcinoma of left lung, stage 1 ([place]) 09/30/2020   a.) clinical stage 1 (cT1cN0cM0). b.) treated with SBRT (60 cGy over 5 fractions).   OSA on CPAP    Osteoporosis    Restless leg    Sepsis ([place])    T2DM (type 2 diabetes mellitus) ([place])    Past  Surgical History:  Procedure Laterality Date   ABDOMINAL HYSTERECTOMY  1978   ANTERIOR INTEROSSEOUS NERVE DECOMPRESSION Right 07/15/2021   Procedure: Cubital tunnel release;  Surgeon: Hessie Knows, MD;  Location: [facility] ORS;  Service: Orthopedics;  Laterality: Right;   APPENDECTOMY  1978   BREAST BIOPSY Left ?   papilloma   BREAST CYST EXCISION Bilateral yrs ago   benign, scars not well visualized   BREAST SURGERY     CARPAL TUNNEL RELEASE Left 04/22/2021   Procedure: Left carpal tunnel release & ulnar nerve release at elbow;  Surgeon: Hessie Knows, MD;  Location:  [facility] ORS;  Service: Orthopedics;  Laterality: Left;   CARPAL TUNNEL RELEASE Right 07/15/2021   Procedure: CARPAL TUNNEL RELEASE;  Surgeon: Hessie Knows, MD;  Location: [facility] ORS;  Service: Orthopedics;  Laterality: Right;   CATARACT EXTRACTION W/ INTRAOCULAR LENS  IMPLANT, BILATERAL Bilateral    CHOLECYSTECTOMY     COLONOSCOPY     ELBOW SURGERY Right    [place] release   EYE SURGERY     FRACTURE SURGERY     GASTRIC BYPASS  12/22/2017   [place]   JOINT REPLACEMENT     KNEE SURGERY Left    tibial fracture with metal plate   TOTAL SHOULDER REPLACEMENT Left 2014   ULNAR TUNNEL RELEASE Left 04/22/2021   Procedure: CUBITAL TUNNEL RELEASE;  Surgeon: Hessie Knows, MD;  Location: [facility] ORS;  Service: Orthopedics;  Laterality: Left;   VIDEO BRONCHOSCOPY WITH ENDOBRONCHIAL NAVIGATION N/A 09/30/2020   Procedure: ROBOTIC ASSISTED VIDEO BRONCHOSCOPY WITH ENDOBRONCHIAL NAVIGATION;  Surgeon: Tyler Pita, MD;  Location: [facility] ORS;  Service: Pulmonary;  Laterality: N/A;   WRIST SURGERY Left    fractures   Social History:  reports that she quit smoking about 46 years ago. Her smoking use included cigarettes. She has a 12.00 pack-year smoking history. She has been exposed to tobacco smoke. She has never used smokeless tobacco. She reports that she does not drink alcohol and does not use drugs.  Allergies  Allergen Reactions   Atorvastatin     Muscle/joint aches   Lantus [Insulin Glargine] Hives   Lyrica [Pregabalin] Other (See Comments)    Headache, disorientation   Lisinopril Hives and Cough         Family History  Problem Relation Age of Onset   Other Mother        died from surgery   AAA (abdominal aortic aneurysm) Mother    Diabetes Father        controlled by diet   Dementia Father        brain atrophy - unknown origin   Breast cancer Cousin        maternal    Prior to Admission medications   Medication Sig Start Date End Date Taking? Authorizing Provider  acetaminophen  (TYLENOL) 325 MG tablet Take 2 tablets (650 mg total) by mouth every 6 (six) hours as needed for mild pain or fever. 07/28/21  Yes Richarda Osmond, MD  amiodarone (PACERONE) 400 MG tablet Take 400 mg by mouth daily.   Yes [provider]  apixaban (ELIQUIS) 5 MG TABS tablet Take 1 tablet (5 mg total) by mouth 2 (two) times daily. 02/17/21  Yes Lesleigh Noe, MD  Calcium Carb-Cholecalciferol (CALCIUM 600+D3 PO) Take 1 tablet by mouth in the morning.   Yes [provider]  cephALEXin (KEFLEX) 500 MG capsule Take 1 capsule (500 mg total) by mouth every 12 (twelve) hours. 07/31/21  Yes Nicole Kindred  A, DO  chlorhexidine (PERIDEX) 0.12 % solution Use as directed 15 mLs in the mouth or throat 2 (two) times daily. (Use after brushing)   Yes [provider]  collagenase (SANTYL) 250 UNIT/GM ointment Apply topically daily. Patient taking differently: Apply 1 application. topically See admin instructions. Apply to wound (left buttock) once daily as scheduled and as needed for wound care 08/01/21  Yes Nicole Kindred A, DO  empagliflozin (JARDIANCE) 25 MG TABS tablet Take 25 mg by mouth daily.   Yes [provider]  ferrous sulfate 325 (65 FE) MG tablet Take 325 mg by mouth 2 (two) times daily with a meal.   Yes [provider]  gabapentin (NEURONTIN) 100 MG capsule Take 1 capsule (100 mg total) by mouth 3 (three) times daily as needed. Take one capsule by mouth nightly for one week. Then increase to 2 capsules nightly for one week. Then 3 capsules nightly. Patient taking differently: Take 100 mg by mouth at bedtime. 07/28/21  Yes Richarda Osmond, MD  glucose blood (CONTOUR NEXT TEST) test strip Check sugar twice daily DX E11.69 02/24/21  Yes Lesleigh Noe, MD  levalbuterol [facility] HFA) 45 MCG/ACT inhaler Inhale 2 puffs into the lungs every 8 (eight) hours as needed for wheezing or shortness of breath (cough). 06/12/21 06/12/22 Yes Tyler Pita, MD  Multiple  Vitamin (MULTIVITAMIN WITH MINERALS) TABS tablet Take 1 tablet by mouth daily. 08/01/21  Yes Nicole Kindred A, DO  osimertinib mesylate (TAGRISSO) 80 MG tablet Take 1 tablet (80 mg total) by mouth daily. 07/30/21  Yes Earlie Server, MD  pantoprazole (PROTONIX) 40 MG tablet Take 40 mg by mouth daily. 07/17/21  Yes [provider]  polyethylene glycol (MIRALAX / GLYCOLAX) 17 g packet Take 17 g by mouth daily. 07/29/21  Yes Richarda Osmond, MD  pramipexole (MIRAPEX) 1 MG tablet Take 1 tablet (1 mg total) by mouth at bedtime. Appt with PCP needed for further refills. 11/27/20  Yes Lesleigh Noe, MD  rosuvastatin (CRESTOR) 10 MG tablet Take 1 tablet (10 mg total) by mouth at bedtime. 07/28/21  Yes Richarda Osmond, MD  sertraline (ZOLOFT) 50 MG tablet Take 1 tablet (50 mg total) by mouth at bedtime. 06/25/21  Yes Lesleigh Noe, MD  sitaGLIPtin-metformin (JANUMET) 50-1000 MG tablet Take 2 tablets by mouth in the morning.   Yes [provider]  TRESIBA FLEXTOUCH 100 UNIT/ML FlexTouch Pen Inject 15 Units into the skin daily. 07/28/21  Yes Richarda Osmond, MD  Vitamin D, Ergocalciferol, (DRISDOL) 1.25 MG (50000 UNIT) CAPS capsule Take 1 capsule (50,000 Units total) by mouth every 7 (seven) days. 07/11/21  Yes Meredith Pel, MD    Physical Exam: Vitals:   08/04/21 1115 08/04/21 1130 08/04/21 1505 08/04/21 1616  BP: 96/62 112/63 115/74 (!) 113/56  Pulse: 84 84 86 79  Resp: (!) $RemoveB'21 17 16 16  'zmgqZqor$ Temp:    98.4 F (36.9 C)  TempSrc:      SpO2: 95% 96% 97% 98%  Weight:      Height:       Physical Exam Vitals and nursing note reviewed.  Constitutional:      Comments: Lethargic but arouses to verbal stimuli.  Appears very weak and tired  HENT:     Head: Normocephalic and atraumatic.     Nose: Nose normal.     Mouth/Throat:     Mouth: Mucous membranes are dry.  Eyes:     Pupils: Pupils are equal,  round, and reactive to light.     Comments: Pale conjunctiva  Cardiovascular:     Rate  and Rhythm: Normal rate and regular rhythm.  Pulmonary:     Effort: Pulmonary effort is normal.     Breath sounds: Normal breath sounds.  Abdominal:     General: Abdomen is flat.  Genitourinary:    Comments: Foley cath in place    Musculoskeletal:     Cervical back: Normal range of motion and neck supple.  Skin:    General: Skin is warm and dry.  Neurological:     Mental Status: She is oriented to person, place, and time.     Motor: Weakness present.  Psychiatric:     Comments: Depressed mood, flat affect    Data Reviewed: Relevant notes from primary care and specialist visits, past discharge summaries as available in EHR, including Care Everywhere. Prior diagnostic testing as pertinent to current admission diagnoses Updated medications and problem lists for reconciliation ED course, including vitals, labs, imaging, treatment and response to treatment Triage notes, nursing and pharmacy notes and ED provider's notes Notable results as noted in HPI Urine analysis shows pyuria Glucose 57, BUN 26, creatinine 1.24, calcium 8.1, total protein 5.1, albumin 2.5, AST 123, ALT 41, alkaline phosphatase 286, INR 2.2, PT 24.6, white count 18.7, hemoglobin 9.0, hematocrit 28.5, platelet count 206 Chest x-ray reviewed by me Left apical opacity is felt to be slightly increased. Likely the central tumor from 07/17/2021, possibly with surround postobstructive pneumonitis or in the appropriate clinical setting, radiation change. Mild left base airspace disease which could represent atelectasis or minimal infection/aspiration. Left hip x-ray shows no acute findings. Moderate degenerative changes of the bilateral hips. Twelve-lead EKG reviewed by me shows sinus rhythm There are no new results to review at this time. Assessment and Plan: * Fall Patient is status post fall due to generalized weakness related to poor oral intake as well as recent diagnosis of metastatic lung cancer. She has an L5  compression fracture following a fall Place patient on fall precautions We will consult palliative care for goals of care discussion as she does not appear to be a good candidate for physical therapy Pain control  UTI (urinary tract infection) Patient was recently treated for E. coli UTI in the hospital and discharged to the skilled nursing facility on Keflex. She had a Foley catheter inserted at the skilled nursing facility and has pyuria with associated leukocytosis We will place patient empirically on Zosyn until urine culture results become available  Lung cancer metastatic to brain [facility]) Patient recently diagnosed with lung cancer with metastasis to liver, brain and bone. Patient was seen by oncology during her last hospitalization who recommended osimertinib mesylate.  Patient is yet to start this medication. CT scan of the chest shows left upper lobe/suprahilar mass with obstruction of the left upper lobe bronchus and worsening left upper lobe consolidation, likely combination of tumor and postobstructive pneumonia and/or atelectasis. We will start patient on Zosyn  Acute embolic stroke [facility]) Patient was recently diagnosed with an acute embolic stroke She has a history of atrial fibrillation Continue Eliquis as secondary prophylaxis for an acute stroke Continue Crestor   Type II diabetes mellitus with renal manifestations ([place]) Patient has a history of diabetes mellitus with complications of stage III chronic kidney disease. She appears to be hypoglycemic and has had poor oral intake Hold oral hypoglycemic agents Hydrate patient with dextrose containing fluids Check blood sugars every 4 hours  Paroxysmal atrial  fibrillation (HCC) Continue amiodarone for rate control Continue Eliquis as secondary prophylaxis for an acute stroke  Essential hypertension Patient is normotensive Monitor blood pressure closely  Pressure injury of skin Patient noted to have deep tissue injury to  the buttocks and stage II pressure injury involving the coccyx. Frequent turning to prevent development of further pressure ulcers Continue to apply barrier cream to buttocks Continue zinc oxide and ascorbic acid to help with wound healing  Hypoglycemia Secondary to poor oral intake Hold oral hypoglycemic agents Encourage p.o. 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  Pathologic fracture Place patient on fall precautions Neurosurgery consult  Depression Continue sertraline      Advance Care Planning:   Code Status: DNR   Consults: Palliative care, neurosurgery  Family Communication: Greater than 50% of time was spent discussing patient's condition and plan of care with her husband at the bedside.  All questions and concerns have been addressed.  He verbalizes understanding and agrees with the plan.  CODE STATUS was discussed and she is a DNR  Severity of Illness: The appropriate patient status for this patient is INPATIENT. Inpatient status is judged to be reasonable and necessary in order to provide the required intensity of service to ensure the patient's safety. The patient's presenting symptoms, physical exam findings, and initial radiographic and laboratory data in the context of their chronic comorbidities is felt to place them at high risk for further clinical deterioration. Furthermore, it is not anticipated that the patient will be medically stable for discharge from the hospital within 2 midnights of admission.   * I certify that at the point of admission it is my clinical judgment that the patient will require inpatient hospital care spanning beyond 2 midnights from the point of admission due to high intensity of service, high risk for further deterioration and high frequency of surveillance required.*  Author: Collier Bullock, MD 08/04/2021 5:14 PM  For on call review  [email or web address].

## 2021-08-04 NOTE — Assessment & Plan Note (Signed)
Patient is status post fall due to generalized weakness related to poor oral intake as well as recent diagnosis of metastatic lung cancer. She has an L5 compression fracture following a fall Place patient on fall precautions We will consult palliative care for goals of care discussion as she does not appear to be a good candidate for physical therapy Pain control

## 2021-08-04 NOTE — Assessment & Plan Note (Signed)
Following a fall ??  Pathologic fracture Place patient on fall precautions Neurosurgery consult

## 2021-08-04 NOTE — ED Notes (Signed)
MD Agbata at bedside ?

## 2021-08-04 NOTE — Assessment & Plan Note (Addendum)
Patient was recently treated for E. coli UTI in the hospital and discharged to the skilled nursing facility on Keflex. She had a Foley catheter inserted at the skilled nursing facility and has pyuria with associated leukocytosis We will place patient empirically on Zosyn until urine culture results become available

## 2021-08-04 NOTE — ED Notes (Signed)
Patient transported to X-ray 

## 2021-08-04 NOTE — ED Notes (Addendum)
Pt resting in bed, a/ox4, states she slipped out of bed last night and is having back pain. Pt denies dizziness or LOC. -head/neck pain. +thinners. Pt states general weakness for some time s/p strokes. Pt has foley with dark urine in it. Pt denies recent illness, fever, cough, sore throat, ABD pain, n/v/d, abnormal GU symptoms.  ?

## 2021-08-04 NOTE — Assessment & Plan Note (Signed)
Continue amiodarone for rate control Continue Eliquis as secondary prophylaxis for an acute stroke

## 2021-08-05 DIAGNOSIS — C3412 Malignant neoplasm of upper lobe, left bronchus or lung: Secondary | ICD-10-CM | POA: Diagnosis not present

## 2021-08-05 DIAGNOSIS — S32050K Wedge compression fracture of fifth lumbar vertebra, subsequent encounter for fracture with nonunion: Secondary | ICD-10-CM | POA: Diagnosis not present

## 2021-08-05 DIAGNOSIS — W19XXXD Unspecified fall, subsequent encounter: Secondary | ICD-10-CM | POA: Diagnosis not present

## 2021-08-05 DIAGNOSIS — E440 Moderate protein-calorie malnutrition: Secondary | ICD-10-CM | POA: Insufficient documentation

## 2021-08-05 LAB — CBC
HCT: 25.2 % — ABNORMAL LOW (ref 36.0–46.0)
Hemoglobin: 7.8 g/dL — ABNORMAL LOW (ref 12.0–15.0)
MCH: 27.8 pg (ref 26.0–34.0)
MCHC: 31 g/dL (ref 30.0–36.0)
MCV: 89.7 fL (ref 80.0–100.0)
Platelets: 121 10*3/uL — ABNORMAL LOW (ref 150–400)
RBC: 2.81 MIL/uL — ABNORMAL LOW (ref 3.87–5.11)
RDW: 18.3 % — ABNORMAL HIGH (ref 11.5–15.5)
WBC: 12 10*3/uL — ABNORMAL HIGH (ref 4.0–10.5)
nRBC: 0 % (ref 0.0–0.2)

## 2021-08-05 LAB — BASIC METABOLIC PANEL
Anion gap: 7 (ref 5–15)
Anion gap: 5 (ref 5–15)
BUN: 22 mg/dL (ref 8–23)
BUN: 20 mg/dL (ref 8–23)
CO2: 22 mmol/L (ref 22–32)
CO2: 24 mmol/L (ref 22–32)
Calcium: 7.6 mg/dL — ABNORMAL LOW (ref 8.9–10.3)
Calcium: 7.4 mg/dL — ABNORMAL LOW (ref 8.9–10.3)
Chloride: 111 mmol/L (ref 98–111)
Chloride: 106 mmol/L (ref 98–111)
Creatinine, Ser: 1.19 mg/dL — ABNORMAL HIGH (ref 0.44–1.00)
Creatinine, Ser: 1.17 mg/dL — ABNORMAL HIGH (ref 0.44–1.00)
GFR, Estimated: 46 mL/min — ABNORMAL LOW (ref >60)
GFR, Estimated: 47 mL/min — ABNORMAL LOW (ref >60)
Glucose, Bld: 95 mg/dL (ref 70–99)
Glucose, Bld: 115 mg/dL — ABNORMAL HIGH (ref 70–99)
Potassium: 3.8 mmol/L (ref 3.5–5.1)
Potassium: 3.7 mmol/L (ref 3.5–5.1)
Sodium: 140 mmol/L (ref 135–145)
Sodium: 135 mmol/L (ref 135–145)

## 2021-08-05 LAB — GLUCOSE, CAPILLARY
Glucose-Capillary: 106 mg/dL — ABNORMAL HIGH (ref 70–99)
Glucose-Capillary: 207 mg/dL — ABNORMAL HIGH (ref 70–99)
Glucose-Capillary: 122 mg/dL — ABNORMAL HIGH (ref 70–99)
Glucose-Capillary: 110 mg/dL — ABNORMAL HIGH (ref 70–99)

## 2021-08-05 NOTE — Assessment & Plan Note (Signed)
Due to chronic diseases, metastatic cancer, poor oral intake. ?Appreciate dietitian's recommendations. ?

## 2021-08-05 NOTE — Plan of Care (Signed)
Heel and buttocks wound dressings changed.  ?Problem: Education: ?Goal: Knowledge of General Education information will improve ?Description: Including pain rating scale, medication(s)/side effects and non-pharmacologic comfort measures ?Outcome: Progressing ?  ?Problem: Health Behavior/Discharge Planning: ?Goal: Ability to manage health-related needs will improve ?Outcome: Progressing ?  ?Problem: Clinical Measurements: ?Goal: Ability to maintain clinical measurements within normal limits will improve ?Outcome: Progressing ?Goal: Will remain free from infection ?Outcome: Progressing ?Goal: Diagnostic test results will improve ?Outcome: Progressing ?Goal: Respiratory complications will improve ?Outcome: Progressing ?Goal: Cardiovascular complication will be avoided ?Outcome: Progressing ?  ?Problem: Activity: ?Goal: Risk for activity intolerance will decrease ?Outcome: Progressing ?  ?Problem: Nutrition: ?Goal: Adequate nutrition will be maintained ?Outcome: Progressing ?  ?Problem: Coping: ?Goal: Level of anxiety will decrease ?Outcome: Progressing ?  ?Problem: Elimination: ?Goal: Will not experience complications related to bowel motility ?Outcome: Progressing ?Goal: Will not experience complications related to urinary retention ?Outcome: Progressing ?  ?Problem: Pain Managment: ?Goal: General experience of comfort will improve ?Outcome: Progressing ?  ?Problem: Safety: ?Goal: Ability to remain free from injury will improve ?Outcome: Progressing ?  ?Problem: Skin Integrity: ?Goal: Risk for impaired skin integrity will decrease ?Outcome: Progressing ?  ?

## 2021-08-05 NOTE — TOC Initial Note (Signed)
Transition of Care (TOC) - Initial/Assessment Note  ? ? ?Patient Details  ?Name: Natasha [name] ?MRN: [id number] ?Date of Birth: [birth year] ? ?Transition of Care (TOC) CM/SW Contact:    ?Pete Pelt, RN ?Phone Number: ?08/05/2021, 2:26 PM ? ?Clinical Narrative:   Patient was discharged to [facility] last week.  She has returned due to a fall at the facility.  Plan is for patient to return to facility on discharge.TOC to follow.              ? ? ?Expected Discharge Plan: [place] ?Barriers to Discharge: Continued Medical Work up ? ? ?Patient Goals and CMS Choice ?  ?  ?  ? ?Expected Discharge Plan and Services ?Expected Discharge Plan: [place] ?  ?Discharge Planning Services: CM Consult ?Post Acute Care Choice: [place] ?Living arrangements for the past 2 months: [facility] ?                ?  ?  ?  ?  ?  ?  ?  ?  ?  ?  ? ?Prior Living Arrangements/Services ?Living arrangements for the past 2 months: [place] ?Lives with:: Facility Resident ?Patient language and need for interpreter reviewed:: Yes (No interpreter required) ?Do you feel safe going back to the place where you live?: Yes      ?Need for Family Participation in Patient Care: Yes (Comment) ?Care giver support system in place?: Yes (comment) ?Current home services:  (SNF for rehab-[facility]) ?Criminal Activity/Legal Involvement Pertinent to Current Situation/Hospitalization: No - Comment as needed ? ?Activities of Daily Living ?Home Assistive Devices/Equipment: Eyeglasses, CPAP ?ADL Screening (condition at time of admission) ?Patient's cognitive ability adequate to safely complete daily activities?: Yes ?Is the patient deaf or have difficulty hearing?: No ?Does the patient have difficulty seeing, even when wearing glasses/contacts?: No ?Does the patient have difficulty concentrating, remembering, or making decisions?: No ?Patient able to express need for assistance with  ADLs?: Yes ?Does the patient have difficulty dressing or bathing?: No ?Independently performs ADLs?: Yes (appropriate for developmental age) ?Does the patient have difficulty walking or climbing stairs?: No ?Weakness of Legs: Both ?Weakness of Arms/Hands: Both ? ?Permission Sought/Granted ?Permission sought to share information with : Case Manager ?Permission granted to share information with : Yes, Verbal Permission Granted ?   ? Permission granted to share info w AGENCY: SNF ?   ?   ? ?Emotional Assessment ?Appearance:: Appears stated age ?Attitude/Demeanor/Rapport: Gracious, Engaged ?Affect (typically observed): Pleasant, Appropriate ?Orientation: : Oriented to Self, Oriented to Place, Oriented to  Time, Oriented to Situation ?Alcohol / Substance Use: Not Applicable ?Psych Involvement: No (comment) ? ?Admission diagnosis:  Lower urinary tract infectious disease [N39.0] ?Fall [W19.XXXA] ?Compression fracture of L5 vertebra, initial encounter ([place]) [F68.127N] ?Patient Active Problem List  ? Diagnosis Date Noted  ? Fall 08/04/2021  ? 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 Hypoglycemia 08/04/2021  ? UTI (urinary tract infection) 07/30/2021  ? Depression 07/30/2021  ? Palliative care encounter   ? 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 Brain metastases ([place])   ? Pressure injury of skin 07/24/2021  ? Anemia   ? Thrombocytopenia ([place])   ? CVA (cerebral vascular accident) ([place]) 07/18/2021  ? Acute ischemic stroke ([place]) 07/17/2021  ? HLD (hyperlipidemia) 07/17/2021  ? Type II diabetes mellitus with renal manifestations ([place]) 07/17/2021  ? COPD (chronic obstructive pulmonary disease) ([place]) 07/17/2021  ? CKD (chronic kidney  disease), stage IV ([place]) 07/17/2021  ? Chronic diastolic CHF (congestive heart failure) ([place]) 07/17/2021  ? Leukocytosis 07/17/2021  ? Bone lesion 07/17/2021  ? Acute embolic stroke ([place]) 68/04/7516  ? Lung cancer metastatic to brain [facility]) 07/17/2021  ? Carpal tunnel  syndrome on right 07/17/2021  ? Lesion of liver   ? Vitamin B12 deficiency 07/14/2021  ? Localized swelling, mass and lump, neck 07/14/2021  ? Elevated sed rate 07/14/2021  ? Vitamin D deficiency 07/14/2021  ? CKD (chronic kidney disease) stage 4, GFR 15-29 ml/min (HCC) 06/24/2021  ? Polyarthralgia 06/16/2021  ? Muscle weakness 06/16/2021  ? Hyperkalemia 06/16/2021  ? Unsteadiness on feet 06/16/2021  ? Acute leg pain, right 06/16/2021  ? Double vision 03/13/2021  ? Dizziness 03/13/2021  ? COVID-19 virus infection 02/24/2021  ? Atrial flutter ([place]) 02/24/2021  ? Chronic bilateral low back pain with bilateral sciatica 02/17/2021  ? Decreased sensation 11/27/2020  ? Dermatitis 11/27/2020  ? Axillary lymphadenopathy 10/11/2020  ? Cancer of upper lobe of left lung ([place]) 10/11/2020  ? Goals of care, counseling/discussion 10/11/2020  ? Status post reverse total shoulder replacement, 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  ? Diverticulitis of colon 02/03/2020  ? Chronic pain syndrome 01/12/2019  ? Neuropathic pain 01/12/2019  ? Neuroforaminal stenosis of lumbar spine 11/02/2018  ? Sacroiliitis ([place]) 11/02/2018  ? Former smoker 09/23/2018  ? Spinal stenosis of lumbar region without neurogenic claudication 09/23/2018  ? Weakness of both hands 09/23/2018  ? Overweight (BMI 25.0-29.9) 08/10/2018  ? Insomnia 06/17/2018  ? Chronic kidney disease with symptom management only, stage 3 (moderate) ([place]) 05/27/2018  ? Nonrheumatic aortic valve stenosis 05/24/2018  ? Iron deficiency anemia 12/24/2017  ? Paroxysmal atrial fibrillation ([place]) 12/24/2017  ? 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/24/2017  ? 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/2017  ? Gastroesophageal reflux disease 11/17/2016  ? OSA (obstructive sleep apnea) 07/24/2016  ? Diverticulosis 07/21/2016  ? Osteopenia of multiple sites 06/04/2016  ? Closed compression fracture of thoracic vertebra ([place]) 04/02/2015  ? Mixed hyperlipidemia 04/02/2015  ? Restless leg syndrome 04/02/2015  ? Closed fracture of  lateral portion of left tibial plateau 04/06/2014  ? Essential hypertension 04/06/2014  ? Mild intermittent asthma without complication 00/17/4944  ? Type 2 diabetes mellitus with other specified complication ([place]) 96/75/9163  ? ?PCP:  Lesleigh Noe, MD ?Pharmacy:   ?[id number] [address] ?[place] ?[place] [address] ?Phone: [phone] Fax: [phone] ? ? ? ? ?Social Determinants of Health (SDOH) Interventions ?  ? ?Readmission Risk Interventions ?Readmission Risk Prevention Plan 08/05/2021  ?Transportation Screening Complete  ?Medication Review Press photographer) Complete  ?PCP or Specialist appointment within 3-5 days of discharge Complete  ?[place] or Home Care Consult Complete  ?SW Recovery Care/Counseling Consult Not Complete  ?SW Consult Not Complete Comments RNCM assigned to case  ?Palliative Care Screening Not Applicable  ?Skilled Nursing Facility Complete  ?Some recent data might be hidden  ? ? ? ?

## 2021-08-05 NOTE — Progress Notes (Signed)
?Progress Note ? ? ?Patient: Natasha [name] DOB: 1940-11-23 DOA: 08/04/2021     1 ?DOS: the patient was seen and examined on 08/05/2021 ?  ?Brief hospital course: ?Natasha [name] is a 81 y.o. female with medical history significant for recent acute embolic CVA, bedbound status, hypertension, A-fib on chronic anticoagulation therapy, diabetes mellitus type 2, GERD, depression, history of metastatic lung cancer status post radiation therapy and started on oral chemotherapy last admission.  She presented to the ED on 08/04/2021 from SNF after a fall when attempting to get out of bed.  She was discharged to SNF for rehab on 3/9.  Since discharge, appetite reportedly has been very poor and she has been extremely weak.  Since fall, having lower back pain without any worsening of lower extremity weakness or changes in bowel or bladder function.  She was found to have a compression fracture at L5.  Neurosurgery was consulted and recommend LSO brace and conservative management.  ? ?Pt had a Foley placed at SNF due to pain and discomfort with use of bedpan.  Prior admission, Foley had been used due to acute retention, removed prior to discharge with patient requiring intermittent in/out caths but voiding had improved.  UA on admission this time appears consistent with UTI.  Pt just completed antibiotics for UTI last admission. ? ?CT of chest showed LUL mass with obstruction of the left upper lobe bronchus with worsening LUL consolidation, likely combination of tumor and post-obstructive pneumonia and/or atelectasis. ? ?Started on empiric IV Zosyn for possible PNA and UTI pending cultures.  ? ? ? ?Assessment and Plan: ?* Fall ?Patient is status post fall due to generalized weakness related to poor oral intake as well as recent diagnosis of metastatic lung cancer. ?She has an L5 compression fracture following a fall ?--Fall precautions ?--Palliative care consulted ? ? ?UTI (urinary tract infection) ?Patient was  recently treated for E. coli UTI in the hospital and discharged to the skilled nursing facility on Kef[facility]. ?She had a Foley catheter inserted at SNF. ?UA on admission with pyuria, CBC with leukocytosis ?--On empiric Zosyn ?--Follow urine culture ? ?Lung cancer metastatic to brain [facility]) ?Patient recently diagnosed with lung cancer with metastasis to liver, brain and bone. ?Seen by oncology last admission, started on oral Tagrisso.  Pt reportedly has not yet started on this medication. ?CT scan of the chest shows left upper lobe/suprahilar mass with obstruction of the left upper lobe bronchus and worsening left upper lobe consolidation, likely combination of tumor and postobstructive pneumonia and/or atelectasis. ?--Started on Zosyn on admission, continue for now ?--Palliative care consulted ? ?Acute embolic stroke ([place]) ?Patient was recently diagnosed with an acute embolic stroke ?She has a history of atrial fibrillation ?Continue Eliquis as secondary prophylaxis for an acute stroke ?Continue Crestor  ? ?Type II diabetes mellitus with renal manifestations ([place]) ?Patient has a history of diabetes mellitus with complications of stage III chronic kidney disease. ?Hypoglycemic on admission due to had poor oral intake ?Hold home diabetes medications. ?Monitor CBG's ?On D5-NS currently ? ?Paroxysmal atrial fibrillation (HCC) ?Continue amiodarone and Eliquis ? ?Essential hypertension ?BP controlled. ?Monitor. ? ?Pressure injury of skin ?Patient noted to have deep tissue injury to the buttocks and stage II pressure injury involving the coccyx. ?Frequent turning to prevent development of further pressure ulcers ?Continue to apply barrier cream to buttocks ?Continue zinc oxide and ascorbic acid to help with wound healing ? ?Pressure Injury 08/04/21 Buttocks Left Unstageable - Full thickness tissue loss in which  the base of the injury is covered by slough (yellow, tan, gray, green or brown) and/or eschar (tan, brown or black)  in the wound bed. covered in escar and red around (Active)  ?08/04/21 1044  ?Location: Buttocks  ?Location Orientation: Left  ?Staging: Unstageable - Full thickness tissue loss in which the base of the injury is covered by slough (yellow, tan, gray, green or brown) and/or eschar (tan, brown or black) in the wound bed.  ?Wound Description (Comments): covered in escar and red around wound  ?Present on Admission: Yes  ? ? ? ? ? ? ?Malnutrition of moderate degree ?Due to chronic diseases, metastatic cancer, poor oral intake. ?Appreciate dietitian's recommendations. ? ?Hypoglycemia ?Secondary to poor oral intake ?Hold oral hypoglycemic agents ?Encourage p.o. intake ?On D5w-NS fluids. ?Monitor closely. ? ? 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 ?Depression ?Continue sertraline ? ? ? ? ?  ? ?Subjective: Pt sleeping but wakes to voice, daughter and husband at bedside.  Pt reports midline low back pain.  Brace not yet in the room.  Pt reports L clavicle pain.  No other acute complaints at this time.  Pt reportedly extremely weak and fatigued, has done minimal work with therapy yet at the facility. ? ?Physical Exam: ?Vitals:  ? 08/05/21 0457 08/05/21 0825 08/05/21 1229 08/05/21 1654  ?BP: (!) 109/59 (!) 107/56 (!) 109/59 111/63  ?Pulse: 83 83 85 82  ?Resp: $Remov'18 18 16 18  'SetAmV$ ?Temp: 97.6 ?F (36.4 ?C) (!) 97.4 ?F (36.3 ?C) 98.3 ?F (36.8 ?C) 98.1 ?F (36.7 ?C)  ?TempSrc: Oral Oral Oral Oral  ?SpO2: 95% 98% 98% 98%  ?Weight:      ?Height:      ? ?General exam: sleeping but easily awakens, no acute distress, frail ?HEENT: atraumatic, pale conjunctiva, anicteric sclera, moist mucus membranes, hearing grossly normal  ?Respiratory system: CTAB, no wheezes, rales or rhonchi, normal respiratory effort. ?Cardiovascular system: normal S1/S2,  RRR, no pedal edema.   ?Gastrointestinal system: soft, NT, ND, no HSM felt, +bowel sounds. ?Central nervous system: limited exam due to pt condition, no gross focal neurologic deficits, normal speech ?Extremities: moves all, no edema, normal tone ?Skin: dry, intact, normal temperature ?Psychiatry: normal mood, congruent affect ? ?Data Reviewed: ? ?Labs notable for  Cr 1.19, Ca 7.6, wbc 12.0, hbg 7.8, plt 121k ? ? ?Family Communication: husband and daughter at bedside ? ?Disposition: ?Status is: Inpatient ?Remains inpatient appropriate because: on IV aBX PENDING CUKTURES AND FURTHER EVALUATION.   ? ? ? Planned Discharge Destination: Skilled nursing facility ? ? ? ?Time spent: 35 minutes ? ?Author: ?Ezekiel Slocumb, DO ?08/05/2021 6:51 PM ? ?For on call review [email or web address].  ?

## 2021-08-05 NOTE — Hospital Course (Signed)
Journii Nierman is a 81 y.o. female with medical history significant for recent acute embolic CVA, bedbound status, hypertension, A-fib on chronic anticoagulation therapy, diabetes mellitus type 2, GERD, depression, history of metastatic lung cancer status post radiation therapy and started on oral chemotherapy last admission.  She presented to the ED on 08/04/2021 from SNF after a fall when attempting to get out of bed.  She was discharged to SNF for rehab on 3/9.  Since discharge, appetite reportedly has been very poor and she has been extremely weak.  Since fall, having lower back pain without any worsening of lower extremity weakness or changes in bowel or bladder function.  She was found to have a compression fracture at L5.  Neurosurgery was consulted and recommend LSO brace and conservative management.  ? ?Pt had a Foley placed at SNF due to pain and discomfort with use of bedpan.  Prior admission, Foley had been used due to acute retention, removed prior to discharge with patient requiring intermittent in/out caths but voiding had improved.  UA on admission this time appears consistent with UTI.  Pt just completed antibiotics for UTI last admission. ? ?CT of chest showed LUL mass with obstruction of the left upper lobe bronchus with worsening LUL consolidation, likely combination of tumor and post-obstructive pneumonia and/or atelectasis. ? ?Started on empiric IV Zosyn for possible PNA and UTI pending cultures.  ? ? ?

## 2021-08-05 NOTE — Progress Notes (Signed)
Initial Nutrition Assessment ? ?DOCUMENTATION CODES:  ? ?Non-severe (moderate) malnutrition in context of chronic illness ? ?INTERVENTION:  ? ?-Magic cup TID with meals, each supplement provides 290 kcal and 9 grams of protein  ?-Liberalize diet to regular ?-MVI with minerals daily ?-500 mg vitamin C BID ?-200 mg zinc sulfate daily x 14 days ? ?NUTRITION DIAGNOSIS:  ? ?Moderate Malnutrition related to chronic illness (lung cancer) as evidenced by mild fat depletion, moderate fat depletion, moderate muscle depletion, severe muscle depletion. ? ?GOAL:  ? ?Patient will meet greater than or equal to 90% of their needs ? ?MONITOR:  ? ?PO intake, Supplement acceptance, Labs, Weight trends, Skin, I & O's ? ?REASON FOR ASSESSMENT:  ? ?Malnutrition Screening Tool ?  ? ?ASSESSMENT:  ? ?Natasha [name] is a 81 y.o. female with medical history significant for recent acute embolic CVA, bedbound status, hypertension, A-fib on chronic anticoagulation therapy, diabetes mellitus type 2, GERD, depression, history of metastatic lung cancer status post radiation therapy who presents to the ER from the skilled nursing facility where she was recently discharged for evaluation after she fell. ? ?Pt admitted s/p fall. Per neurosurgery notes, pt with mild L5 fracture- no plan for surgical interventions.  ? ?Reviewed I/O's: +152 ml x 24 hours ? ?Per [facility] notes, pt with unstageable pressure injury to lt ischial tuberosity and DPTI to lt heel.  ? ?Spoke with pt, husband, and daughter at bedside. Pt husband reports pt has experienced a general decline in health over the past 2-3 weeks, since last hospitalization and discharge to SNF. Per husband, pt with very poor appetite and has been consuming very little food, but has been drinking some liquids. Daughter shares that pt has had a difficult transition to SNF, including falls and further decline in appetite. Pt husband reports she has lost a lot of weight and muscle, but is unsure how much  weight she has lost.  ? ?Pt lung tray unattempted as pt did not feel like eating. Pt did eat most of her breakfast. When asked about supplements, pt clearly stated "no". Reviewed supplement options with pt, however, she refused Boost Breeze, Ensure, and Prosource. Per husband, pt consumed chocolate ice cream this AM.  ? ?Reviewed wt hx; wt has been stable over the past 3 months.  ? ?Discussed importance of good meal and supplement intake to promote healing. RD will liberalize diet to provide widest variety of food selections.  ? ?Palliative care consult pending for goals of care.  ?  ?Medications reviewed and include vitamin C, calcium-vitamin D, ferrous sulfate, miralax, vitamin D, zinc sulfate, and dextrose 5%-0.9% sodium chloride infusion @ 75 ml/hr.  ? ?Lab Results  ?Component Value Date  ? HGBA1C 8.3 (H) 07/17/2021  ? PTA DM medications are 25 mg jardiance daily, 50-1000 mg sitagliptin-metformin BID, and 15 units tresiba daily.  ? ?Labs reviewed: CBGS: 47-207 (inpatient orders for glycemic control are none).   ? ?NUTRITION - FOCUSED PHYSICAL EXAM: ? ?Flowsheet Row Most Recent Value  ?Orbital Region Mild depletion  ?Upper Arm Region Mild depletion  ?Thoracic and Lumbar Region Mild depletion  ?Buccal Region Mild depletion  ?Temple Region Mild depletion  ?Clavicle Bone Region Severe depletion  ?Clavicle and Acromion Bone Region Severe depletion  ?Scapular Bone Region Severe depletion  ?Dorsal Hand Moderate depletion  ?Patellar Region Severe depletion  ?Anterior Thigh Region Severe depletion  ?Posterior Calf Region Severe depletion  ?Edema (RD Assessment) Mild  ?Hair Reviewed  ?Eyes Reviewed  ?Mouth Reviewed  ?Skin Reviewed  ?  Nails Reviewed  ? ?  ? ? ?Diet Order:   ?Diet Order   ? ?       ?  Diet regular Room service appropriate? Yes; Fluid consistency: Thin  Diet effective now       ?  ? ?  ?  ? ?  ? ? ?EDUCATION NEEDS:  ? ?Education needs have been addressed ? ?Skin:  Skin Assessment: Skin Integrity Issues: ?Skin  Integrity Issues:: Unstageable, DTI ?DTI: lt heel ?Stage II: - ?Unstageable: lt ischial tuberosity ?Incisions: - ? ?Last BM:  Unknown ? ?Height:  ? ?Ht Readings from Last 1 Encounters:  ?08/04/21 $RemoveBe'5\' 4"'bfoCrTkVR$  (1.626 m)  ? ? ?Weight:  ? ?Wt Readings from Last 1 Encounters:  ?08/04/21 68 kg  ? ? ?Ideal Body Weight:  54.5 kg ? ?BMI:  Body mass index is 25.73 kg/m?. ? ?Estimated Nutritional Needs:  ? ?Kcal:  1800-2000 ? ?Protein:  85-100 grams ? ?Fluid:  > 1.8 L ? ? ? ?Loistine Chance, RD, LDN, CDCES ?Registered Dietitian II ?Certified Diabetes Care and Education Specialist ?Please refer to [facility] for RD and/or RD on-call/weekend/after hours pager  ?

## 2021-08-05 NOTE — Consult Note (Signed)
? ?Referring Physician:  ?[name] referring provider defined for this encounter. ? ?Primary Physician:  ?Lesleigh Noe, MD ? ?Chief Complaint:  back pain, weakness ? ?History of Present Illness: ?08/05/2021 ?Natasha [name] is a 81 y.o. female who presents with the chief complaint of back pain after a fall.  She recently suffered a CVA, and has been in a nursing facility recuperating.  She is quite weak. She was diagnosed with stage 4 lung cancer. ? ?She has [name] new neurologic complaints.  She does have mobility issues since her strokes, which has left her unable to walk unassisted.  She was working on mobility during the nursing facility stay. ? ? ?Review of Systems:  ?A 10 point review of systems is negative, except for the pertinent positives and negatives detailed in the HPI. ? ?Past Medical History: ?Past Medical History:  ?Diagnosis Date  ? A-fib ([place])   ? a.) CHA2DS2-VASc Score = 6 (age x 2, sex, HTN, aortic plaque, T2DM). b.) rate/rhythm maintained on oral amiodarone + metoprolol succinate; chronically anticoagulated with full dose apixaban  ? Anemia   ? 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 Aortic atherosclerosis ([place])   ? Arthritis   ? Atrial flutter with rapid ventricular response ([place]) 02/24/2021  ? a.) in the setting of (+) SARS-CoV-2 infection; converted to NSR with increased dose of oral amiodarone.  ? Chronic cough   ? CKD (chronic kidney disease), stage III ([place])   ? Complication of anesthesia   ? COPD (chronic obstructive pulmonary disease) ([place])   ? Depression   ? Diastolic dysfunction   ? a.) TTE 04/08/2014: EF 60%; mild concentric LVH; G2DD. b.) TTE 12/21/2019: EF 60-65%, LA mildly dilated, mild-mod MR; PASP 36.8; G1DD. c.) TTE 02/25/2021: EF 60-65%; normal LV function with mild concentric LVH; G1DD  ? Diverticulitis   ? Dyspnea   ? High cholesterol   ? History of 2019 novel coronavirus disease (COVID-19) 02/24/2021  ? History of hiatal hernia   ? History of kidney stones   ? Hypertension   ?  Insomnia   ? Long term current use of anticoagulant   ? a.) apixaban  ? Lumbar spinal stenosis   ? Mild asthma   ? Murmur   ? Non-small cell carcinoma of left lung, stage 1 ([place]) 09/30/2020  ? a.) clinical stage 1 (cT1cN0cM0). b.) treated with SBRT (60 cGy over 5 fractions).  ? OSA on CPAP   ? Osteoporosis   ? Restless leg   ? Sepsis ([place])   ? T2DM (type 2 diabetes mellitus) ([place])   ? ? ?Past Surgical History: ?Past Surgical History:  ?Procedure Laterality Date  ? ABDOMINAL HYSTERECTOMY  1978  ? ANTERIOR INTEROSSEOUS NERVE DECOMPRESSION Right 07/15/2021  ? Procedure: Cubital tunnel release;  Surgeon: Hessie Knows, MD;  Location: [facility];  Service: Orthopedics;  Laterality: Right;  ? APPENDECTOMY  1978  ? BREAST BIOPSY Left ?  ? papilloma  ? BREAST CYST EXCISION Bilateral yrs ago  ? benign, scars not well visualized  ? BREAST SURGERY    ? CARPAL TUNNEL RELEASE Left 04/22/2021  ? Procedure: Left carpal tunnel release & ulnar nerve release at elbow;  Surgeon: Hessie Knows, MD;  Location: [facility];  Service: Orthopedics;  Laterality: Left;  ? CARPAL TUNNEL RELEASE Right 07/15/2021  ? Procedure: CARPAL TUNNEL RELEASE;  Surgeon: Hessie Knows, MD;  Location: [facility];  Service: Orthopedics;  Laterality: Right;  ? CATARACT EXTRACTION W/ INTRAOCULAR LENS  IMPLANT, BILATERAL Bilateral   ?  CHOLECYSTECTOMY    ? COLONOSCOPY    ? ELBOW SURGERY Right   ? Bosworth release  ? EYE SURGERY    ? FRACTURE SURGERY    ? GASTRIC BYPASS  12/22/2017  ? Roux-N-Y  ? JOINT REPLACEMENT    ? KNEE SURGERY Left   ? tibial fracture with metal plate  ? TOTAL SHOULDER REPLACEMENT Left 2014  ? ULNAR TUNNEL RELEASE Left 04/22/2021  ? Procedure: CUBITAL TUNNEL RELEASE;  Surgeon: Hessie Knows, MD;  Location: [facility];  Service: Orthopedics;  Laterality: Left;  ? VIDEO BRONCHOSCOPY WITH ENDOBRONCHIAL NAVIGATION N/A 09/30/2020  ? Procedure: ROBOTIC ASSISTED VIDEO BRONCHOSCOPY WITH ENDOBRONCHIAL NAVIGATION;  Surgeon: Tyler Pita, MD;  Location:  [facility];  Service: Pulmonary;  Laterality: N/A;  ? WRIST SURGERY Left   ? fractures  ? ? ?Allergies: ?Allergies as of 08/04/2021 - Review Complete 08/04/2021  ?Allergen Reaction Noted  ? Atorvastatin  11/06/2019  ? Lantus [insulin glargine] Hives 04/14/2017  ? Lyrica [pregabalin] Other (See Comments) 06/16/2021  ? Lisinopril Hives and Cough 11/06/2019  ? ? ?Medications: ? ?Current Facility-Administered Medications:  ?  acetaminophen (TYLENOL) tablet 650 mg, 650 mg, Oral, Q6H PRN, Agbata, Tochukwu, MD ?  amiodarone (PACERONE) tablet 400 mg, 400 mg, Oral, Daily, Agbata, Tochukwu, MD, 400 mg at 08/04/21 1638 ?  apixaban (ELIQUIS) tablet 5 mg, 5 mg, Oral, BID, Agbata, Tochukwu, MD, 5 mg at 08/04/21 2128 ?  ascorbic acid (VITAMIN C) tablet 500 mg, 500 mg, Oral, Daily, Agbata, Tochukwu, MD, 500 mg at 08/04/21 1639 ?  calcium-vitamin D (OSCAL WITH D) 500-5 MG-MCG per tablet, , Oral, q AM, Agbata, Tochukwu, MD ?  chlorhexidine (PERIDEX) 0.12 % solution 15 mL, 15 mL, Mouth/Throat, BID, Agbata, Tochukwu, MD, 15 mL at 08/04/21 2123 ?  collagenase (SANTYL) ointment, , Topical, Daily, Agbata, Tochukwu, MD ?  dextrose 5 %-0.9 % sodium chloride infusion, , Intravenous, Continuous, Agbata, Tochukwu, MD, Last Rate: 75 mL/hr at 08/05/21 0610, New Bag at 08/05/21 0610 ?  ferrous sulfate tablet 325 mg, 325 mg, Oral, BID WC, Agbata, Tochukwu, MD, 325 mg at 08/04/21 1638 ?  gabapentin (NEURONTIN) capsule 100 mg, 100 mg, Oral, QHS, Agbata, Tochukwu, MD, 100 mg at 08/04/21 2123 ?  HYDROcodone-acetaminophen (NORCO/VICODIN) 5-325 MG per tablet 1 tablet, 1 tablet, Oral, Q4H PRN, Agbata, Tochukwu, MD, 1 tablet at 08/05/21 0034 ?  leptospermum manuka honey (MEDIHONEY) paste 1 application., 1 application., Topical, Daily, Agbata, Tochukwu, MD, 1 application. at 08/05/21 0630 ?  levalbuterol (XOPENEX) nebulizer solution 1.25 mg, 1.25 mg, Inhalation, Q8H PRN, Agbata, Tochukwu, MD ?  lidocaine (LIDODERM) 5 % 1 patch, 1 patch, Transdermal, Q24H,  Agbata, Tochukwu, MD, 1 patch at 08/04/21 2123 ?  multivitamin with minerals tablet 1 tablet, 1 tablet, Oral, Daily, Agbata, Tochukwu, MD, 1 tablet at 08/04/21 1637 ?  ondansetron (ZOFRAN) tablet 4 mg, 4 mg, Oral, Q6H PRN **OR** ondansetron (ZOFRAN) injection 4 mg, 4 mg, Intravenous, Q6H PRN, Agbata, Tochukwu, MD ?  pantoprazole (PROTONIX) EC tablet 40 mg, 40 mg, Oral, Daily, Agbata, Tochukwu, MD, 40 mg at 08/04/21 1637 ?  piperacillin-tazobactam (ZOSYN) IVPB 3.375 g, 3.375 g, Intravenous, Q8H, Nazari, Walid A, RPH, Last Rate: 12.5 mL/hr at 08/05/21 0608, 3.375 g at 08/05/21 4174 ?  polyethylene glycol (MIRALAX / GLYCOLAX) packet 17 g, 17 g, Oral, Daily, Agbata, Tochukwu, MD, 17 g at 08/04/21 1643 ?  rosuvastatin (CRESTOR) tablet 10 mg, 10 mg, Oral, QHS, Agbata, Tochukwu, MD, 10 mg at 08/04/21 2123 ?  sertraline (ZOLOFT) tablet  50 mg, 50 mg, Oral, QHS, Agbata, Tochukwu, MD, 50 mg at 08/04/21 2123 ?  Vitamin D (Ergocalciferol) (DRISDOL) capsule 50,000 Units, 50,000 Units, Oral, Q7 days, Agbata, Tochukwu, MD, 50,000 Units at 08/04/21 1807 ?  zinc sulfate capsule 220 mg, 220 mg, Oral, Daily, Agbata, Tochukwu, MD, 220 mg at 08/04/21 1638 ? ? ?Social History: ?Social History  ? ?Tobacco Use  ? Smoking status: Former  ?  Packs/day: 1.00  ?  Years: 12.00  ?  Pack years: 12.00  ?  Types: Cigarettes  ?  Quit date: 05/26/1975  ?  Years since quitting: 46.2  ?  Passive exposure: Past  ? Smokeless tobacco: Never  ?Vaping Use  ? Vaping Use: Never used  ?Substance Use Topics  ? Alcohol use: [name]  ?  Comment: rarely  ? Drug use: Never  ? ? ?Family Medical History: ?Family History  ?Problem Relation Age of Onset  ? Other Mother   ?     died from surgery  ? AAA (abdominal aortic aneurysm) Mother   ? Diabetes Father   ?     controlled by diet  ? Dementia Father   ?     brain atrophy - unknown origin  ? Breast cancer Cousin   ?     maternal  ? ? ?Physical Examination: ?Vitals:  ? 08/05/21 0035 08/05/21 0457  ?BP: (!) 102/57 (!) 109/59   ?Pulse: 84 83  ?Resp: 17 18  ?Temp: 97.8 ?F (36.6 ?C) 97.6 ?F (36.4 ?C)  ?SpO2: 97% 95%  ? ? ? ?General: Patient is thin, calm, collected, and in [name] apparent distress. ? ?Psychiatric: Patient is non-anxious. ?

## 2021-08-06 DIAGNOSIS — W19XXXA Unspecified fall, initial encounter: Secondary | ICD-10-CM | POA: Diagnosis not present

## 2021-08-06 DIAGNOSIS — Z515 Encounter for palliative care: Secondary | ICD-10-CM

## 2021-08-06 DIAGNOSIS — W19XXXD Unspecified fall, subsequent encounter: Secondary | ICD-10-CM

## 2021-08-06 DIAGNOSIS — C3412 Malignant neoplasm of upper lobe, left bronchus or lung: Secondary | ICD-10-CM | POA: Diagnosis not present

## 2021-08-06 LAB — CBC
HCT: 27.2 % — ABNORMAL LOW (ref 36.0–46.0)
Hemoglobin: 8.4 g/dL — ABNORMAL LOW (ref 12.0–15.0)
MCH: 27.8 pg (ref 26.0–34.0)
MCHC: 30.9 g/dL (ref 30.0–36.0)
MCV: 90.1 fL (ref 80.0–100.0)
Platelets: 94 10*3/uL — ABNORMAL LOW (ref 150–400)
RBC: 3.02 MIL/uL — ABNORMAL LOW (ref 3.87–5.11)
RDW: 18.2 % — ABNORMAL HIGH (ref 11.5–15.5)
WBC: 13.8 10*3/uL — ABNORMAL HIGH (ref 4.0–10.5)
nRBC: 0 % (ref 0.0–0.2)

## 2021-08-06 LAB — URINE CULTURE: Culture: >=100000 — AB

## 2021-08-06 LAB — GLUCOSE, CAPILLARY
Glucose-Capillary: 121 mg/dL — ABNORMAL HIGH (ref 70–99)
Glucose-Capillary: 122 mg/dL — ABNORMAL HIGH (ref 70–99)
Glucose-Capillary: 131 mg/dL — ABNORMAL HIGH (ref 70–99)
Glucose-Capillary: 127 mg/dL — ABNORMAL HIGH (ref 70–99)

## 2021-08-06 MED ORDER — ENSURE ENLIVE PO LIQD
237.0000 mL | Freq: Two times a day (BID) | ORAL | Status: DC
  Administered 2021-08-06 – 2021-08-08 (×2): 237 mL via ORAL

## 2021-08-06 NOTE — Consult Note (Signed)
? ?  ?Palliative Medicine ?[place] [facility] ?[phone] [phone]) [phone] ? ? ?Name: Natasha [name] ?Date: 08/06/2021 ?MRN: [id number]  ?DOB: February 02, 1941 ? ?Patient Care Team: ?Lesleigh Noe, MD as PCP - General (Family Medicine) ?Kate Sable, MD as PCP - Cardiology (Cardiology) ?Telford Nab, RN as Sales executive  ? ? ?REASON FOR CONSULTATION: ?Natasha [name] is a 81 y.o. female with multiple medical problems including history of lung cancer status postradiation, A-fib on Eliquis, COPD, OSA, CKD stage IV, and history of CHF.  Patient was hospitalized 07/17/2021 to 07/31/2021 with CVA. Patient was also incidentally found to have metastatic disease in bone and liver.  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  Her hospitalization was complicated by recurrent CVA.  Patient was ultimately discharged to rehab and readmitted on 08/04/2021 after a fall.  She was found to have UTI.  Palliative care is consulted to address goals. ? ?SOCIAL HISTORY:    ? reports that she quit smoking about 46 years ago. Her smoking use included cigarettes. She has a 12.00 pack-year smoking history. She has been exposed to tobacco smoke. She has never used smokeless tobacco. She reports that she does not drink alcohol and does not use drugs. ? ?Patient is married to her husband of over 68 years.  She had a son who is now deceased.  She has a grandson that lives with her.  Patient worked as a Journalist, newspaper. ? ?ADVANCE DIRECTIVES:  ?On file ? ?CODE STATUS: DNR ? ?PAST MEDICAL HISTORY: ?Past Medical History:  ?Diagnosis Date  ? A-fib ([place])   ? a.) CHA2DS2-VASc Score = 6 (age x 2, sex, HTN, aortic plaque, T2DM). b.) rate/rhythm maintained on oral amiodarone + metoprolol succinate; chronically anticoagulated with full dose apixaban  ? Anemia   ? 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 Aortic  atherosclerosis ([place])   ? Arthritis   ? Atrial flutter with rapid ventricular response ([place]) 02/24/2021  ? a.) in the setting of (+) SARS-CoV-2 infection; converted to NSR with increased dose of oral amiodarone.  ? Chronic cough   ? CKD (chronic kidney disease), stage III ([place])   ? Complication of anesthesia   ? COPD (chronic obstructive pulmonary disease) ([place])   ? Depression   ? Diastolic dysfunction   ? a.) TTE 04/08/2014: EF 60%; mild concentric LVH; G2DD. b.) TTE 12/21/2019: EF 60-65%, LA mildly dilated, mild-mod MR; PASP 36.8; G1DD. c.) TTE 02/25/2021: EF 60-65%; normal LV function with mild concentric LVH; G1DD  ? Diverticulitis   ? Dyspnea   ? High cholesterol   ? History of 2019 novel coronavirus disease (COVID-19) 02/24/2021  ? History of hiatal hernia   ? History of kidney stones   ? Hypertension   ? Insomnia   ? Long term current use of anticoagulant   ? a.) apixaban  ? Lumbar spinal stenosis   ? Mild asthma   ? Murmur   ? Non-small cell carcinoma of left lung, stage 1 ([place]) 09/30/2020  ? a.) clinical stage 1 (cT1cN0cM0). b.) treated with SBRT (60 cGy over 5 fractions).  ? OSA on CPAP   ? Osteoporosis   ? Restless leg   ? Sepsis ([place])   ? T2DM (type 2 diabetes mellitus) ([place])   ? ? ?PAST SURGICAL HISTORY:  ?Past Surgical History:  ?Procedure Laterality Date  ? ABDOMINAL HYSTERECTOMY  1978  ? ANTERIOR INTEROSSEOUS NERVE DECOMPRESSION Right  07/15/2021  ? Procedure: Cubital tunnel release;  Surgeon: Hessie Knows, MD;  Location: [facility];  Service: Orthopedics;  Laterality: Right;  ? APPENDECTOMY  1978  ? BREAST BIOPSY Left ?  ? papilloma  ? BREAST CYST EXCISION Bilateral yrs ago  ? benign, scars not well visualized  ? BREAST SURGERY    ? CARPAL TUNNEL RELEASE Left 04/22/2021  ? Procedure: Left carpal tunnel release & ulnar nerve release at elbow;  Surgeon: Hessie Knows, MD;  Location: [facility];  Service: Orthopedics;  Laterality: Left;  ? CARPAL TUNNEL RELEASE Right 07/15/2021  ? Procedure: CARPAL TUNNEL  RELEASE;  Surgeon: Hessie Knows, MD;  Location: [facility];  Service: Orthopedics;  Laterality: Right;  ? CATARACT EXTRACTION W/ INTRAOCULAR LENS  IMPLANT, BILATERAL Bilateral   ? CHOLECYSTECTOMY    ? COLONOSCOPY    ? ELBOW SURGERY Right   ? Bosworth release  ? EYE SURGERY    ? FRACTURE SURGERY    ? GASTRIC BYPASS  12/22/2017  ? Roux-N-Y  ? JOINT REPLACEMENT    ? KNEE SURGERY Left   ? tibial fracture with metal plate  ? TOTAL SHOULDER REPLACEMENT Left 2014  ? ULNAR TUNNEL RELEASE Left 04/22/2021  ? Procedure: CUBITAL TUNNEL RELEASE;  Surgeon: Hessie Knows, MD;  Location: [facility];  Service: Orthopedics;  Laterality: Left;  ? VIDEO BRONCHOSCOPY WITH ENDOBRONCHIAL NAVIGATION N/A 09/30/2020  ? Procedure: ROBOTIC ASSISTED VIDEO BRONCHOSCOPY WITH ENDOBRONCHIAL NAVIGATION;  Surgeon: [place] Pita, MD;  Location: [facility];  Service: Pulmonary;  Laterality: N/A;  ? WRIST SURGERY Left   ? fractures  ? ? ?HEMATOLOGY/ONCOLOGY HISTORY:  ?Oncology History  ?Cancer of upper lobe of left lung ([place])  ?10/11/2020 Initial Diagnosis  ? Cancer of upper lobe of left lung ([place]) ?  ?10/11/2020 Cancer Staging  ? Staging form: Lung, AJCC 8th Edition ?- Clinical stage from 10/11/2020: cT1, cN0, cM0 - Signed by Earlie Server, MD on 10/11/2020 ?Stage prefix: Initial diagnosis ?Laterality: Left ?  ?Lung cancer metastatic to brain [facility])  ?07/17/2021 Initial Diagnosis  ? Lung cancer metastatic to brain [facility]) ?  ?07/25/2021 Cancer Staging  ? Staging form: Lung, AJCC 8th Edition ?- Clinical stage from 07/25/2021: Stage IV (cTX, cNX, pM1) - Signed by Earlie Server, MD on 07/25/2021 ?Stage prefix: Initial diagnosis ?  ? ? ?ALLERGIES:  is allergic to atorvastatin, lantus [insulin glargine], lyrica [pregabalin], and lisinopril. ? ?MEDICATIONS:  ?Current Facility-Administered Medications  ?Medication Dose Route Frequency Provider Last Rate Last Admin  ? acetaminophen (TYLENOL) tablet 650 mg  650 mg Oral Q6H PRN Agbata, Tochukwu, MD      ? amiodarone (PACERONE) tablet  400 mg  400 mg Oral Daily Agbata, Tochukwu, MD   400 mg at 08/06/21 0908  ? apixaban (ELIQUIS) tablet 5 mg  5 mg Oral BID Agbata, Tochukwu, MD   5 mg at 08/06/21 0908  ? ascorbic acid (VITAMIN C) tablet 500 mg  500 mg Oral Daily Agbata, Tochukwu, MD   500 mg at 08/06/21 0908  ? calcium-vitamin D (OSCAL WITH D) 500-5 MG-MCG per tablet   Oral q AM Collier Bullock, MD   Given at 08/06/21 [id number]  ? chlorhexidine (PERIDEX) 0.12 % solution 15 mL  15 mL Mouth/Throat BID Agbata, Tochukwu, MD   15 mL at 08/06/21 0907  ? collagenase (SANTYL) ointment   Topical Daily Agbata, Tochukwu, MD      ? ferrous sulfate tablet 325 mg  325 mg Oral BID WC Agbata, Tochukwu, MD   325 mg  at 08/06/21 0908  ? gabapentin (NEURONTIN) capsule 100 mg  100 mg Oral QHS Agbata, Tochukwu, MD   100 mg at 08/05/21 2115  ? HYDROcodone-acetaminophen (NORCO/VICODIN) 5-325 MG per tablet 1 tablet  1 tablet Oral Q4H PRN Agbata, Tochukwu, MD   1 tablet at 08/06/21 0907  ? leptospermum manuka honey (MEDIHONEY) paste 1 application.  1 application. Topical Daily Agbata, Tochukwu, MD   1 application. at 08/05/21 0630  ? levalbuterol (XOPENEX) nebulizer solution 1.25 mg  1.25 mg Inhalation Q8H PRN Agbata, Tochukwu, MD      ? lidocaine (LIDODERM) 5 % 1 patch  1 patch Transdermal Q24H Agbata, Tochukwu, MD   1 patch at 08/05/21 1953  ? multivitamin with minerals tablet 1 tablet  1 tablet Oral Daily Agbata, Tochukwu, MD   1 tablet at 08/06/21 0908  ? ondansetron (ZOFRAN) tablet 4 mg  4 mg Oral Q6H PRN Agbata, Tochukwu, MD   4 mg at 08/05/21 1352  ? Or  ? ondansetron (ZOFRAN) injection 4 mg  4 mg Intravenous Q6H PRN Agbata, Tochukwu, MD      ? pantoprazole (PROTONIX) EC tablet 40 mg  40 mg Oral Daily Agbata, Tochukwu, MD   40 mg at 08/06/21 0907  ? piperacillin-tazobactam (ZOSYN) IVPB 3.375 g  3.375 g Intravenous Q8H Nazari, Walid A, RPH 12.5 mL/hr at 08/06/21 0906 3.375 g at 08/06/21 9373  ? polyethylene glycol (MIRALAX / GLYCOLAX) packet 17 g  17 g Oral Daily Agbata,  Tochukwu, MD   17 g at 08/06/21 0907  ? rosuvastatin (CRESTOR) tablet 10 mg  10 mg Oral QHS Agbata, Tochukwu, MD   10 mg at 08/05/21 2115  ? sertraline (ZOLOFT) tablet 50 mg  50 mg Oral QHS Agbata, Tochukwu, MD   50 m

## 2021-08-06 NOTE — Progress Notes (Signed)
Patient moved to low air loss bed.  ?

## 2021-08-06 NOTE — Progress Notes (Addendum)
Patients daughter Eppie Gibson [phone]) would like to speak to Dr. Billie Ruddy for an update on the patient in the AM please.  ?

## 2021-08-06 NOTE — Progress Notes (Addendum)
?Progress Note ? ? ?Patient: Natasha [name] TDS:[id number] DOB: [birth year] DOA: 08/04/2021     2 ?DOS: the patient was seen and examined on 08/06/2021 ?  ?Brief hospital course: ?Natasha [name] is a 81 y.o. female with medical history significant for recent acute embolic CVA, bedbound status, hypertension, A-fib on chronic anticoagulation therapy, diabetes mellitus type 2, GERD, depression, history of metastatic lung cancer status post radiation therapy and started on oral chemotherapy last admission.  She presented to the ED on 08/04/2021 from SNF after a fall when attempting to get out of bed.  She was discharged to SNF for rehab on 3/9.  Since discharge, appetite reportedly has been very poor and she has been extremely weak.  Since fall, having lower back pain without any worsening of lower extremity weakness or changes in bowel or bladder function.  She was found to have a compression fracture at L5.  Neurosurgery was consulted and recommend LSO brace and conservative management.  ? ?Pt had a Foley placed at SNF due to pain and discomfort with use of bedpan.  Prior admission, Foley had been used due to acute retention, removed prior to discharge with patient requiring intermittent in/out caths but voiding had improved.  UA on admission this time appears consistent with UTI.  Pt just completed antibiotics for UTI last admission. ? ?CT of chest showed LUL mass with obstruction of the left upper lobe bronchus with worsening LUL consolidation, likely combination of tumor and post-obstructive pneumonia and/or atelectasis. ? ?Started on empiric IV Zosyn for possible PNA and UTI pending cultures.  ? ? ? ?Assessment and Plan: ?* Fall ?Patient is status post fall due to generalized weakness related to poor oral intake as well as recent diagnosis of metastatic lung cancer. ?She has an L5 compression fracture following a fall ?--Fall precautions ?--PT ? ? ?UTI (urinary tract infection) ?Patient was recently treated for E. coli  UTI in the hospital and discharged to the skilled nursing facility on Keflex. ?She had a Foley catheter inserted at SNF, for reason of easy cleaning, per husband. ?UA on admission with pyuria.  Pt reported dysuria ?Plan: ?--cont empiric zosyn (also for lungs) ?--Follow urine culture ?--d/c Foley today ? ?Lung cancer metastatic to brain [facility]) ?Patient recently diagnosed with lung cancer with metastasis to liver, brain and bone. ?Seen by oncology last admission, started on oral Tagrisso.  Pt reportedly has not yet started on this medication. ?CT scan of the chest shows left upper lobe/suprahilar mass with obstruction of the left upper lobe bronchus and worsening left upper lobe consolidation, likely combination of tumor and postobstructive pneumonia and/or atelectasis. ?--Started on Zosyn on admission ?Plan: ?--cont zosyn for now for post-obstructive PNA ?--Palliative care consulted ? ?History of embolic stroke ?Patient was recently diagnosed with an acute embolic stroke ?She has a history of atrial fibrillation ?Continue Eliquis as secondary prophylaxis for an acute stroke ? ?Type II diabetes mellitus with renal manifestations (HCC) ?--A1c 8.3, poorly controlled ?Hold home diabetes medications. ?--d/c D5 infusion ? ?Paroxysmal atrial fibrillation (HCC) ?Continue amiodarone and Eliquis ? ?Essential hypertension ?BP controlled. ?Monitor. ? ?Pressure injury of skin ?Patient noted to have deep tissue injury to the buttocks and stage II pressure injury involving the coccyx. ?Frequent turning to prevent development of further pressure ulcers ?Continue to apply barrier cream to buttocks ?Continue zinc oxide and ascorbic acid to help with wound healing ? ?Pressure Injury 08/04/21 Buttocks Left Unstageable - Full thickness tissue loss in which the base of the injury is covered  by slough (yellow, tan, gray, green or brown) and/or eschar (tan, brown or black) in the wound bed. covered in escar and red around (Active)  ?08/04/21  1044  ?Location: Buttocks  ?Location Orientation: Left  ?Staging: Unstageable - Full thickness tissue loss in which the base of the injury is covered by slough (yellow, tan, gray, green or brown) and/or eschar (tan, brown or black) in the wound bed.  ?Wound Description (Comments): covered in escar and red around wound  ?Present on Admission: Yes  ? ? ? ? ? ? ?Malnutrition of moderate degree ?Due to chronic diseases, metastatic cancer, poor oral intake. ?Appreciate dietitian's recommendations. ? ?Hypoglycemia ?Secondary to poor oral intake ?Hold oral hypoglycemic agents ?--d/c D5 MIVF ?--Ensure mixed with milk ? ? 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 ?Depression ?Continue sertraline ? ? ? ? ?  ? ?Subjective:  ?Pt reported dysuria and cough.  Not eating well because pt didn't like the food, but drank lots water, per pt. ? ? ?Physical Exam: ? ?Constitutional: NAD, AAOx3 ?HEENT: conjunctivae and lids normal, EOMI ?CV: No cyanosis.   ?RESP: normal respiratory effort, on RA ?Extremities: No effusions, edema in BLE ?SKIN: warm, dry ?Neuro: II - XII grossly intact.   ?Psych: depressed mood and affect.  Appropriate judgement and reason ?Foley present. ? ? ?Data Reviewed: ? ?Family Communication: husband updated at bedside today ? ?Disposition: ?Status is: Inpatient ?Remains inpatient appropriate because: on IV abx ? ? Planned Discharge Destination: Skilled nursing facility ? ? ? ?Time spent: 35 minutes ? ?Author: ?Enzo Bi, MD ?08/06/2021 2:58 PM ? ?For on call review [email or web address].  ?

## 2021-08-07 ENCOUNTER — Other Ambulatory Visit (HOSPITAL_COMMUNITY): Payer: Self-pay

## 2021-08-07 DIAGNOSIS — S32050K Wedge compression fracture of fifth lumbar vertebra, subsequent encounter for fracture with nonunion: Secondary | ICD-10-CM | POA: Diagnosis not present

## 2021-08-07 DIAGNOSIS — W19XXXA Unspecified fall, initial encounter: Secondary | ICD-10-CM | POA: Diagnosis not present

## 2021-08-07 LAB — BASIC METABOLIC PANEL
Anion gap: 11 (ref 5–15)
BUN: 20 mg/dL (ref 8–23)
CO2: 20 mmol/L — ABNORMAL LOW (ref 22–32)
Calcium: 8.2 mg/dL — ABNORMAL LOW (ref 8.9–10.3)
Chloride: 108 mmol/L (ref 98–111)
Creatinine, Ser: 1.32 mg/dL — ABNORMAL HIGH (ref 0.44–1.00)
GFR, Estimated: 41 mL/min — ABNORMAL LOW (ref >60)
Glucose, Bld: 122 mg/dL — ABNORMAL HIGH (ref 70–99)
Potassium: 3.7 mmol/L (ref 3.5–5.1)
Sodium: 139 mmol/L (ref 135–145)

## 2021-08-07 LAB — CBC
HCT: 26.7 % — ABNORMAL LOW (ref 36.0–46.0)
Hemoglobin: 8.3 g/dL — ABNORMAL LOW (ref 12.0–15.0)
MCH: 27.9 pg (ref 26.0–34.0)
MCHC: 31.1 g/dL (ref 30.0–36.0)
MCV: 89.6 fL (ref 80.0–100.0)
Platelets: 99 10*3/uL — ABNORMAL LOW (ref 150–400)
RBC: 2.98 MIL/uL — ABNORMAL LOW (ref 3.87–5.11)
RDW: 18.2 % — ABNORMAL HIGH (ref 11.5–15.5)
WBC: 15 10*3/uL — ABNORMAL HIGH (ref 4.0–10.5)
nRBC: 0 % (ref 0.0–0.2)

## 2021-08-07 LAB — GLUCOSE, CAPILLARY
Glucose-Capillary: 128 mg/dL — ABNORMAL HIGH (ref 70–99)
Glucose-Capillary: 122 mg/dL — ABNORMAL HIGH (ref 70–99)

## 2021-08-07 LAB — MAGNESIUM: Magnesium: 1.8 mg/dL (ref 1.7–2.4)

## 2021-08-07 MED ORDER — SODIUM CHLORIDE 0.9 % IV SOLN
3.0000 g | Freq: Three times a day (TID) | INTRAVENOUS | Status: DC
  Administered 2021-08-07 – 2021-08-08 (×3): 3 g via INTRAVENOUS
  Filled 2021-08-07 (×2): qty 8
  Filled 2021-08-07 (×2): qty 3

## 2021-08-07 MED ORDER — FLUCONAZOLE 50 MG PO TABS
150.0000 mg | ORAL_TABLET | Freq: Once | ORAL | Status: AC
  Administered 2021-08-07: 150 mg via ORAL
  Filled 2021-08-07: qty 1

## 2021-08-07 NOTE — Progress Notes (Signed)
Nutrition Follow-up ? ?DOCUMENTATION CODES:  ? ?Non-severe (moderate) malnutrition in context of chronic illness ? ?INTERVENTION:  ? ?-Continue Magic cup TID with meals, each supplement provides 290 kcal and 9 grams of protein  ?-Continue regular diet ?-Continue MVI with minerals daily ?-Continue 500 mg vitamin C BID ?-Continue 200 mg zinc sulfate daily x 14 days ?-Continue Ensure Enlive po BID, each supplement provides 350 kcal and 20 grams of protein, per MD ? ?NUTRITION DIAGNOSIS:  ? ?Moderate Malnutrition related to chronic illness (lung cancer) as evidenced by mild fat depletion, moderate fat depletion, moderate muscle depletion, severe muscle depletion. ? ?Ongoing ? ?GOAL:  ? ?Patient will meet greater than or equal to 90% of their needs ? ?Progressing  ? ?MONITOR:  ? ?PO intake, Supplement acceptance, Labs, Weight trends, Skin, I & O's ? ?REASON FOR ASSESSMENT:  ? ?Malnutrition Screening Tool ?  ? ?ASSESSMENT:  ? ?Natasha [name] is a 81 y.o. female with medical history significant for recent acute embolic CVA, bedbound status, hypertension, A-fib on chronic anticoagulation therapy, diabetes mellitus type 2, GERD, depression, history of metastatic lung cancer status post radiation therapy who presents to the ER from the skilled nursing facility where she was recently discharged for evaluation after she fell. ? ?Reviewed I/O's: -273 ml x 24 hours and +119 ml since admission ? ?UOP: 753 ml x 24 hours ? ?Pt remains with poor appetite. Noted meal completions 0-20%. MD ordered Ensure supplements, however, pt refusing.  ? ?Palliative care following; plan to continue current scope of treatment.  ? ?Medications reviewed and include vitamin C, oscal with vitamin D, miralax, vitamin D, and zinc.  ? ?Plan to d/c back to SNF once medically stable.  ? ?Labs reviewed: CBGS: 122-131 (inpatient orders for glycemic control are none).   ? ?Diet Order:   ?Diet Order   ? ?       ?  Diet regular Room service appropriate? Yes; Fluid  consistency: Thin  Diet effective now       ?  ? ?  ?  ? ?  ? ? ?EDUCATION NEEDS:  ? ?Education needs have been addressed ? ?Skin:  Skin Assessment: Skin Integrity Issues: ?Skin Integrity Issues:: Unstageable, DTI ?DTI: lt heel ?Stage II: - ?Unstageable: lt ischial tuberosity ?Incisions: - ? ?Last BM:  Unknown ? ?Height:  ? ?Ht Readings from Last 1 Encounters:  ?08/04/21 $RemoveBe'5\' 4"'OLiwojxfT$  (1.626 m)  ? ? ?Weight:  ? ?Wt Readings from Last 1 Encounters:  ?08/04/21 68 kg  ? ? ?Ideal Body Weight:  54.5 kg ? ?BMI:  Body mass index is 25.73 kg/m?. ? ?Estimated Nutritional Needs:  ? ?Kcal:  1800-2000 ? ?Protein:  85-100 grams ? ?Fluid:  > 1.8 L ? ? ? ?Loistine Chance, RD, LDN, CDCES ?Registered Dietitian II ?Certified Diabetes Care and Education Specialist ?Please refer to [facility] for RD and/or RD on-call/weekend/after hours pager  ?

## 2021-08-07 NOTE — Assessment & Plan Note (Signed)
Presume post-obstructive PNA ?--received 3 days of zosyn ?--transition to IV Unasyn today ?

## 2021-08-07 NOTE — Progress Notes (Signed)
Orthopedic Tech Progress Note ?Patient Details:  ?Natasha [name] ?Jun 24, 1940 ?[id number] ?Called in order to [facility] for LSO back brace ?Patient ID: Synda Bagent, female   DOB: [birth year], 81 y.o.   MRN: [id number] ? ?Shandria Clinch A Sinda Leedom ?08/07/2021, 10:09 AM ? ?

## 2021-08-07 NOTE — TOC Progression Note (Addendum)
Transition of Care (TOC) - Progression Note  ? ? ?Patient Details  ?Name: Natasha [name] ?MRN: [id number] ?Date of Birth: 1940/08/14 ? ?Transition of Care (TOC) CM/SW Contact  ?Pete Pelt, RN ?Phone Number: ?08/07/2021, 2:14 PM ? ?Clinical Narrative:   Patient is from [facility].  Family is undecided about returning to facility at this time due to fall.  Care team, unit director will be in to speak with patient and family and follow up with TOC ? ?[phone] Husband would like to speak with care team.  He is aware that patient will transfer back to [facility] on discharge.  Care team made aware of husband request. ? ?Expected Discharge Plan: [place] ?Barriers to Discharge: Continued Medical Work up ? ?Expected Discharge Plan and Services ?Expected Discharge Plan: [place] ?  ?Discharge Planning Services: CM Consult ?Post Acute Care Choice: [place] ?Living arrangements for the past 2 months: [place] ?                ?  ?  ?  ?  ?  ?  ?  ?  ?  ?  ? ? ?Social Determinants of Health (SDOH) Interventions ?  ? ?Readmission Risk Interventions ?Readmission Risk Prevention Plan 08/07/2021 08/05/2021  ?Transportation Screening Complete Complete  ?Medication Review Press photographer) - Complete  ?PCP or Specialist appointment within 3-5 days of discharge - Complete  ?[place] or Home Care Consult - Complete  ?SW Recovery Care/Counseling Consult - Not Complete  ?SW Consult Not Complete Comments - RNCM assigned to case  ?Palliative Care Screening - Not Applicable  ?Skilled Nursing Facility - Complete  ?Some recent data might be hidden  ? ? ?

## 2021-08-07 NOTE — Evaluation (Signed)
Occupational Therapy Evaluation ?Patient Details ?Name: Sanskriti Greenlaw ?MRN: [id number] ?DOB: 11-17-1940 ?Today's Date: 08/07/2021 ? ? ?History of Present Illness Latronda Spink is 81 y.o. female with history of atrial fibrillation on Eliquis, CHF, hypertension, hyperlipidemia, diabetes, COPD, chronic kidney disease, lung CA, depression, and recent R carpal tunnel release (Jul 15, 2021), acute infarcts on 07/17/21 and 07/22/21 MRI. History of metastatic lung cancer status post radiation therapy and started on oral chemotherapy last admission.  She presented to the ED on 08/04/2021 from SNF after a fall when attempting to get out of bed, found to have a compression fracture at L5.  Neurosurgery was consulted and recommend LSO brace and conservative management.  ? ?Clinical Impression ?  ?Ms Tindol was seen for OT/PT co-evaluation this date. Prior to hospital admission, pt was recently d/c to STR, prior to rehab pt was ambulatory, since STR pt reports she has not gotten out of bed. Pt presents to acute OT demonstrating impaired ADL performance and functional mobility 2/2 decreased activity tolerance and functional strength/ROM/balance deficits. 8/10 pain in B shoulders at rest, decreasing to 6/10 in sitting - pt limited by pain t/o session.  ? ?Pt currently requires MOD A don/doff gown seated EOB. CGA static sitting decreasing to MIN A dynamic sitting tasks, tolerates ~15 min sitting. MAX A x2 sit<>stand, clears rear put does not achieve upright posture 2/2 shoulder pain. TOTAL A x2 for lateral scoot t/f along EOB. Pt would benefit from skilled OT to address noted impairments and functional limitations (see below for any additional details). Upon hospital discharge, recommend STR to maximize pt safety and return to PLOF.   ? ?Recommendations for follow up therapy are one component of a multi-disciplinary discharge planning process, led by the attending physician.  Recommendations may be updated based on patient status,  additional functional criteria and insurance authorization.  ? ?Follow Up Recommendations ? Skilled nursing-short term rehab (<3 hours/day)  ?  ?Assistance Recommended at Discharge Frequent or constant Supervision/Assistance  ?Patient can return home with the following Two people to help with walking and/or transfers;Two people to help with bathing/dressing/bathroom;Help with stairs or ramp for entrance ? ?  ?Functional Status Assessment ? Patient has had a recent decline in their functional status and demonstrates the ability to make significant improvements in function in a reasonable and predictable amount of time.  ?Equipment Recommendations ? BSC/3in1;Hospital bed;Other (comment) (hoyer lift)  ?  ?Recommendations for Other Services   ? ? ?  ?Precautions / Restrictions Precautions ?Precautions: Fall ?Restrictions ?Weight Bearing Restrictions: No  ? ?  ? ?Mobility Bed Mobility ?Overal bed mobility: Needs Assistance ?Bed Mobility: Rolling, Sidelying to Sit, Sit to Sidelying ?Rolling: Max assist, +2 for physical assistance ?Sidelying to sit: Max assist, +2 for physical assistance ?  ?Sit to supine: Total assist, +2 for physical assistance ?  ?  ?  ? ?Transfers ?Overall transfer level: Needs assistance ?  ?Transfers: Bed to chair/wheelchair/BSC, Sit to/from Stand ?Sit to Stand: Max assist, +2 physical assistance, From elevated surface ?Stand pivot transfers: Total assist, +2 physical assistance ?  ?  ?  ?  ?General transfer comment: clears rear however does not achieve upright posture - limited by B shoulder pain ?  ? ?  ?Balance Overall balance assessment: Needs assistance ?Sitting-balance support: Bilateral upper extremity supported ?Sitting balance-Leahy Scale: Fair ?  ?  ?Standing balance support: Bilateral upper extremity supported, Reliant on assistive device for balance ?Standing balance-Leahy Scale: Poor ?  ?  ?  ?  ?  ?  ?  ?  ?  ?  ?  ?  ?   ? ?  ADL either performed or assessed with clinical judgement   ? ?ADL Overall ADL's : Needs assistance/impaired ?  ?  ?  ?  ?  ?  ?  ?  ?  ?  ?  ?  ?  ?  ?  ?  ?  ?  ?  ?General ADL Comments: MOD A don/doff gown seated EOB. CGA static sitting decreasing to MIN A dynamic sitting. TOTAL A x2 for ADL t/fs  ? ? ? ? ?Pertinent Vitals/Pain Pain Assessment ?Pain Assessment: 0-10 ?Pain Score: 8  ?Pain Location: B shoulders, L worse ?Pain Descriptors / Indicators: Discomfort, Grimacing, Guarding ?Pain Intervention(s): Limited activity within patient's tolerance, Repositioned  ? ? ? ?Hand Dominance Right ?  ?Extremity/Trunk Assessment Upper Extremity Assessment ?Upper Extremity Assessment: Generalized weakness ?  ?Lower Extremity Assessment ?Lower Extremity Assessment: Generalized weakness ?  ?  ?  ?Communication Communication ?Communication: No difficulties ?  ?Cognition Arousal/Alertness: Awake/alert ?Behavior During Therapy: [facility] for tasks assessed/performed ?Overall Cognitive Status: Within Functional Limits for tasks assessed ?  ?  ?  ?  ?  ?  ?  ?  ?  ?  ?  ?  ?  ?  ?  ?  ?General Comments: follows commands with increased time ?  ?  ? ?Home Living Family/patient expects to be discharged to:: Skilled nursing facility ?Living Arrangements: Spouse/significant other ?Available Help at Discharge: Family;Available 24 hours/day ?Type of Home: House ?Home Access: Stairs to enter ?Entrance Stairs-Number of Steps: 3 ?Entrance Stairs-Rails: None ?Home Layout: One level ?  ?  ?  ?  ?  ?  ?  ?Home Equipment: None ?  ?Additional Comments: recently d/c to STR ?  ? ?  ?Prior Functioning/Environment Prior Level of Function : Needs assist ?  ?  ?  ?  ?  ?  ?Mobility Comments: since STR d/c pt reports has not gotten out of bed, mobilizes with therapy only. Prior to STR pt was mobilie without AD ?ADLs Comments: Assist for ADLs at rehab. Prior to recent admission PRN assist only from husband ?  ? ?  ?  ?OT Problem List: Decreased strength;Decreased activity tolerance;Impaired balance (sitting and/or  standing);Decreased range of motion;Decreased safety awareness;Decreased coordination ?  ?   ?OT Treatment/Interventions: Self-care/ADL training;Therapeutic exercise;Energy conservation;DME and/or AE instruction;Therapeutic activities;Patient/family education;Balance training;Neuromuscular education  ?  ?OT Goals(Current goals can be found in the care plan section) Acute Rehab OT Goals ?Patient Stated Goal: to walk ?OT Goal Formulation: With patient ?Time For Goal Achievement: 08/21/21 ?Potential to Achieve Goals: Fair ?ADL Goals ?Pt Will Perform Grooming: with set-up;with supervision;sitting ?Pt Will Perform Lower Body Dressing: with min assist;sitting/lateral leans ?Pt Will Transfer to Toilet: squat pivot transfer;bedside commode  ?OT Frequency: Min 2X/week ?  ? ?Co-evaluation PT/OT/SLP Co-Evaluation/Treatment: Yes ?Reason for Co-Treatment: For patient/therapist safety;To address functional/ADL transfers;Complexity of the patient's impairments (multi-system involvement) ?PT goals addressed during session: Mobility/safety with mobility;Balance ?OT goals addressed during session: ADL's and self-care ?  ? ?  ?AM-PAC OT "6 Clicks" Daily Activity     ?Outcome Measure Help from another person eating meals?: A Little ?Help from another person taking care of personal grooming?: A Little ?Help from another person toileting, which includes using toliet, bedpan, or urinal?: A Lot ?Help from another person bathing (including washing, rinsing, drying)?: A Lot ?Help from another person to put on and taking off regular upper body clothing?: A Little ?Help from another person to put on and taking off regular lower body clothing?: A  Lot ?6 Click Score: 15 ?  ?End of Session Equipment Utilized During Treatment: Gait belt ? ?Activity Tolerance: Patient tolerated treatment well ?Patient left: in bed;with call bell/phone within reach;with family/visitor present ? ?OT Visit Diagnosis: Other abnormalities of gait and mobility  (R26.89);Muscle weakness (generalized) (M62.81)  ?              ?Time: 0102-7253 ?OT Time Calculation (min): 34 min ?Charges:  OT General Charges ?$OT Visit: 1 Visit ?OT Evaluation ?$OT Eval Moderate Complexity: 1 Mod ?OT

## 2021-08-07 NOTE — Progress Notes (Signed)
Spoke with husband at bedside regarding concerns of injury with fall during previous admission. Concerns were raised by patients daughter-in-law and husband confirmed that he was aware that injury occurred at SNF prior to this admission. No needs from patient or husband at this time. Orma Flaming, RN ? ?

## 2021-08-07 NOTE — Progress Notes (Signed)
Family member of patient Anderson Malta Craft([id number])related as family member by marriage.Initially requested contact with [place] as states "her (patients)husband is having a hard time handling the situation of her health and seems to have shut down and has become uncommunicative about information involving the patient or the plan of care and I can't get information and he is having a hard time coming up with a long term plan for her". She also stated patient suffered a fall at [facility] that resulted in a neck fracture(however, was told fracture had occurred before admission to [facility] at facility pt was in prior to admission, and has developed skin breakdown. Also stated she had concerns as to whether the dressing on her foot was being changed. Also stated she had concerns patient had clots in her legs as her feet had become swollen. Also wondered when patient was going to be discharged, as husband had not been told when to expect discharge. Communicated with patient's MD, patient's RN about concerns. Had paged the [facility] as requested at the start of the conversation, Chaplin returned call, stated she would come to the room. Spent about 30 minutes talking with family member. Was very careful not to release any information as she was not on the list and had explained pt husband did not want to give out much information.Made department director aware of concerns. ?

## 2021-08-07 NOTE — Progress Notes (Signed)
[facility]) Hospital Liaison Note: ? ?This is a pending outpatient-based Palliative Care patient. Will continue to follow for disposition. ? ?Please call with any outpatient palliative questions or concerns. ? ?Thank you, ?Lorelee Market, LPN ?[facility] Hospital Liaison ?[phone] ?

## 2021-08-07 NOTE — Progress Notes (Signed)
Missed call from [phone]. Returned call. Stated it was her grandson, and was confused as he had been told she would not be transferring anywhere. Asked him to call the unit to speak with the RN as I did not have all the details of her plan of care. ?

## 2021-08-07 NOTE — Evaluation (Addendum)
Physical Therapy Evaluation ?Patient Details ?Name: Natasha [name] ?MRN: [id number] ?DOB: February 15, 1941 ?Today's Date: 08/07/2021 ? ?History of Present Illness ? Natasha [name] is 81 y.o. female with history of atrial fibrillation on Eliquis, CHF, hypertension, hyperlipidemia, diabetes, COPD, chronic kidney disease, lung CA, depression, and recent R carpal tunnel release (Jul 15, 2021), acute infarcts on 07/17/21 and 07/22/21 MRI. History of metastatic lung cancer status post radiation therapy and started on oral chemotherapy last admission.  She presented to the ED on 08/04/2021 from SNF after a fall when attempting to get out of bed, found to have a compression fracture at L5.  Neurosurgery was consulted and recommend LSO brace and conservative management. ?  ?Clinical Impression ? Pt was seen today for PT/OT co-evaluation. Pt was recently discharged to STR prior to current hospital admission. Prior to previous hospital admission and rehab, she was ambulatory, but reports today since STR she has not gotten out of bed. Pt demonstrates acute PT impairments w/ ADLs and functional mobility, decreased LE strength/balance. She reports 8/10 B shoulder pain at rest, but improves to 6/10 seated EOB. ? ?MaxAx2 is required for bed mobility to sit EOB due to increased L shoulder pain when attempting rolling technique to sit EOB. Once seated EOB she is able to maintain static sitting for ~24min to change hospital gown w/ modA. MaxAx2 sit to stands were performed; she was able to clear rear from bed, but could not achieve upright posture due to LE weakness and shoulder pain. She required TotalAx2 for lateral scoot and bed mobility transfer back into bed. Pt will benefit from continued skilled PT in order to increase LE strength/balance, improve mobility/gait, decrease c/o pain, and restore PLOF. Current discharge recommendation to SNF is appropriate due to the level of assistance required by the patient to ensure safety and improve  overall function. ? ?   ? ?Recommendations for follow up therapy are one component of a multi-disciplinary discharge planning process, led by the attending physician.  Recommendations may be updated based on patient status, additional functional criteria and insurance authorization. ? ?Follow Up Recommendations Skilled nursing-short term rehab (<3 hours/day) ? ?  ?Assistance Recommended at Discharge Frequent or constant Supervision/Assistance  ?Patient can return home with the following ? Help with stairs or ramp for entrance;Assist for transportation;Assistance with cooking/housework;Two people to help with walking and/or transfers;Direct supervision/assist for medications management;Direct supervision/assist for financial management;Two people to help with bathing/dressing/bathroom ? ?  ?Equipment Recommendations Rolling walker (2 wheels);Wheelchair (measurements PT);Wheelchair cushion (measurements PT);Hospital bed;Other (comment) (WC with elevating leg rests, hoyer lift)  ?Recommendations for Other Services ?    ?  ?Functional Status Assessment Patient has had a recent decline in their functional status and demonstrates the ability to make significant improvements in function in a reasonable and predictable amount of time.  ? ?  ?Precautions / Restrictions Precautions ?Precautions: Fall ?Restrictions ?Weight Bearing Restrictions: No  ? ?  ? ?Mobility ? Bed Mobility ?Overal bed mobility: Needs Assistance ?Bed Mobility: Rolling, Sidelying to Sit, Sit to Sidelying ?Rolling: Max assist, +2 for physical assistance ?Sidelying to sit: Max assist, +2 for physical assistance ?  ?Sit to supine: Total assist, +2 for physical assistance ?  ?  ?  ? ?Transfers ?  ?  ?Transfers: Bed to chair/wheelchair/BSC, Sit to/from Stand ?Sit to Stand: Max assist, +2 physical assistance, From elevated surface ?Stand pivot transfers: Total assist, +2 physical assistance ?  ?  ?  ?  ?General transfer comment: clears rear however does not  achieve upright posture - limited by B shoulder pain ?  ? ?Ambulation/Gait ?  ?  ?  ?  ?  ?  ?  ?  ? ?Stairs ?  ?  ?  ?  ?  ? ?Wheelchair Mobility ?  ? ?Modified Rankin (Stroke Patients Only) ?  ? ?  ? ?Balance Overall balance assessment: Needs assistance ?Sitting-balance support: Bilateral upper extremity supported ?Sitting balance-Leahy Scale: Fair ?  ?  ?Standing balance support: Bilateral upper extremity supported, Reliant on assistive device for balance ?Standing balance-Leahy Scale: Poor ?  ?  ?  ?  ?  ?  ?  ?  ?  ?  ?  ?  ?   ? ? ? ?Pertinent Vitals/Pain Pain Assessment ?Pain Assessment: 0-10 ?Pain Score: 8  ?Pain Location: B shoulders, L worse ?Pain Descriptors / Indicators: Discomfort, Grimacing, Guarding ?Pain Intervention(s): Limited activity within patient's tolerance, Repositioned  ? ? ?Home Living Family/patient expects to be discharged to:: Skilled nursing facility ?Living Arrangements: Spouse/significant other ?Available Help at Discharge: Family;Available 24 hours/day ?Type of Home: House ?Home Access: Stairs to enter ?Entrance Stairs-Rails: None ?Entrance Stairs-Number of Steps: 3 ?  ?Home Layout: One level ?Home Equipment: None ?Additional Comments: recently d/c to STR  ?  ?Prior Function Prior Level of Function : Needs assist ?  ?  ?  ?  ?  ?  ?Mobility Comments: since STR d/c pt reports has not gotten out of bed, mobilizes with therapy only. Prior to STR pt was mobilie without AD ?ADLs Comments: Assist for ADLs at rehab. Prior to recent admission PRN assist only from husband ?  ? ? ?Hand Dominance  ? Dominant Hand: Right ? ?  ?Extremity/Trunk Assessment  ? Upper Extremity Assessment ?Upper Extremity Assessment: Generalized weakness ?  ? ?Lower Extremity Assessment ?Lower Extremity Assessment: Generalized weakness ?  ? ?   ?Communication  ? Communication: No difficulties  ?Cognition   ?  ?  ?  ?  ?  ?  ?  ?  ?  ?  ?  ?  ?  ?  ?  ?  ?  ?  ?  ?  ?  ? ?  ?General Comments   ? ?  ?Exercises     ? ?Assessment/Plan  ?  ?PT Assessment Patient needs continued PT services  ?PT Problem List Decreased strength;Decreased mobility;Decreased activity tolerance;Decreased balance;Decreased knowledge of use of DME;Decreased coordination;Pain;Decreased safety awareness ? ?   ?  ?PT Treatment Interventions DME instruction;Therapeutic exercise;Gait training;Balance training;Stair training;Neuromuscular re-education;Functional mobility training;Therapeutic activities;Patient/family education   ? ?PT Goals (Current goals can be found in the Care Plan section)  ?Acute Rehab PT Goals ?Patient Stated Goal: return home ?PT Goal Formulation: With patient/family ?Time For Goal Achievement: 08/21/21 ?Potential to Achieve Goals: Fair ? ?  ?Frequency Min 2X/week ?  ? ? ?Co-evaluation PT/OT/SLP Co-Evaluation/Treatment: Yes ?Reason for Co-Treatment: Complexity of the patient's impairments (multi-system involvement);For patient/therapist safety;To address functional/ADL transfers ?PT goals addressed during session: Mobility/safety with mobility;Balance ?OT goals addressed during session: ADL's and self-care ?  ? ? ?  ?AM-PAC PT "6 Clicks" Mobility  ?Outcome Measure Help needed turning from your back to your side while in a flat bed without using bedrails?: A Lot ?Help needed moving from lying on your back to sitting on the side of a flat bed without using bedrails?: A Lot ?Help needed moving to and from a bed to a chair (including a wheelchair)?: Total ?Help needed standing up from a  chair using your arms (e.g., wheelchair or bedside chair)?: Total ?Help needed to walk in hospital room?: Total ?Help needed climbing 3-5 steps with a railing? : Total ?6 Click Score: 8 ? ?  ?End of Session   ?Activity Tolerance: Patient tolerated treatment well ?Patient left: in bed;with call bell/phone within reach;with bed alarm set;with family/visitor present ?Nurse Communication: Mobility status ?PT Visit Diagnosis: Unsteadiness on feet  (R26.81);Other abnormalities of gait and mobility (R26.89);Difficulty in walking, not elsewhere classified (R26.2);Pain ?Pain - Right/Left:  (right & left) ?Pain - part of body: Shoulder ?  ? ?Time: 7482-7078 ?PT Time Calculation

## 2021-08-07 NOTE — Progress Notes (Signed)
Cross Cover ?Patient unable to void after multiple attempts. Bladder scan volum 400. In and out cath ordered ? ?

## 2021-08-07 NOTE — Progress Notes (Addendum)
Contacted by Nathen May by telephone. She stated her father in law had told her the patient was discharging to [facility] and she thought he was mistaken, so she asked where the patient was going. Explained she would need a password(at that time did not realize one was not set up)and I could not give her information over the phone. She was dismayed I would not give her information stating she was involved in the patient's care. I repeatedly told her I could not release information to her. She hung up with no other reply ?

## 2021-08-07 NOTE — Progress Notes (Addendum)
Natasha [name] came to the Administrative Coordinator office, stating since you could not give me information over the phone, could you tell me what is going on in person. Explained she would need to discuss plan of care with the RN caring for the patient, and all the information was going through the husband.Also stated I could not help her right now and she would have to go to the unit. Made [name] and Robin RN about issue. I then received a phone call from the patient from her room phone. She was difficult to understand but stated "Why did you tell my daughter she had to leave the hospital?" Reassured patient I had not told her daughter in any manner she would have to leave the hospital. Patient then asked what was happening with her. Told patient I was not aware of her plan of care and would ask the RN to speak with her about the plan of care. Grandson called asking questions about what was going on. I told him he would have to call the unit to get information. Robin RN aware of issues going on with family and patient. ?

## 2021-08-07 NOTE — Progress Notes (Signed)
?  08/07/21 1300  ?Clinical Encounter Type  ?Visited With Patient and family together  ?Visit Type Initial  ?Referral From Nurse  ?Consult/Referral To Chaplain  ? ?Chaplain responded to nurse page. Chaplain met with patient and daughter Natasha [name]. Patient and daughter were concerned about the husband. Chaplain provided compassionate non-anxious presence and reflective listening. Chaplain will also follow up with husband this afternoon. Patient and family appreciated Chaplain visit. ?

## 2021-08-07 NOTE — Progress Notes (Signed)
?Progress Note ? ? ?Patient: Natasha [name] BUL:[id number] DOB: 02-11-1941 DOA: 08/04/2021     3 ?DOS: the patient was seen and examined on 08/07/2021 ?  ?Brief hospital course: ?Natasha [name] is a 81 y.o. female with medical history significant for recent acute embolic CVA, bedbound status, hypertension, A-fib on chronic anticoagulation therapy, diabetes mellitus type 2, GERD, depression, history of metastatic lung cancer status post radiation therapy and started on oral chemotherapy last admission.  She presented to the ED on 08/04/2021 from SNF after a fall when attempting to get out of bed.  She was discharged to SNF for rehab on 3/9.  Since discharge, appetite reportedly has been very poor and she has been extremely weak.  Since fall, having lower back pain without any worsening of lower extremity weakness or changes in bowel or bladder function.  She was found to have a compression fracture at L5.  Neurosurgery was consulted and recommend LSO brace and conservative management.  ? ?Pt had a Foley placed at SNF due to pain and discomfort with use of bedpan.  Prior admission, Foley had been used due to acute retention, removed prior to discharge with patient requiring intermittent in/out caths but voiding had improved.  UA on admission this time appears consistent with UTI.  Pt just completed antibiotics for UTI last admission. ? ?CT of chest showed LUL mass with obstruction of the left upper lobe bronchus with worsening LUL consolidation, likely combination of tumor and post-obstructive pneumonia and/or atelectasis. ? ?Started on empiric IV Zosyn for possible PNA and UTI pending cultures.  ? ? ? ?Assessment and Plan: ?* Fall ?Patient is status post fall due to generalized weakness related to poor oral intake as well as recent diagnosis of metastatic lung cancer. ?She has an L5 compression fracture following a fall ?--Fall precautions ?--PT ? ? ?UTI (urinary tract infection) ?Patient was recently treated for E. coli  UTI in the hospital and discharged to the skilled nursing facility on Keflex. ?She had a Foley catheter inserted at SNF, for unclear reason. ?UA on admission with pyuria.  Pt reported dysuria.  Urine cx grew yeast.   ?Plan: ?--Diflucan x1 today ? ?Lung cancer metastatic to brain [facility]) ?Patient recently diagnosed with lung cancer with metastasis to liver, brain and bone. ?Seen by oncology last admission, started on oral Tagrisso.  Pt reportedly has not yet started on this medication. ?CT scan of the chest shows left upper lobe/suprahilar mass with obstruction of the left upper lobe bronchus and worsening left upper lobe consolidation, likely combination of tumor and postobstructive pneumonia and/or atelectasis. ?--Started on Zosyn on admission ?--Palliative care consulted, pt wants to continue cancer treatment. ? ?History of embolic stroke ?Patient was recently diagnosed with an acute embolic stroke ?She has a history of atrial fibrillation ?Continue Eliquis as secondary prophylaxis for an acute stroke ? ?Type II diabetes mellitus with renal manifestations (HCC) ?--A1c 8.3, poorly controlled ?Hold home diabetes medications. ? ?Paroxysmal atrial fibrillation ([place]) ?Continue amiodarone and Eliquis ? ?Essential hypertension ?BP controlled. ?Monitor. ? ?Pressure injury of skin ?Patient noted to have deep tissue injury to the buttocks and stage II pressure injury involving the coccyx. ?Frequent turning to prevent development of further pressure ulcers ?Continue to apply barrier cream to buttocks ?Continue zinc oxide and ascorbic acid to help with wound healing ? ?Pressure Injury 08/04/21 Buttocks Left Unstageable - Full thickness tissue loss in which the base of the injury is covered by slough (yellow, tan, gray, green or brown) and/or eschar (tan,  brown or black) in the wound bed. covered in escar and red around (Active)  ?08/04/21 1044  ?Location: Buttocks  ?Location Orientation: Left  ?Staging: Unstageable - Full  thickness tissue loss in which the base of the injury is covered by slough (yellow, tan, gray, green or brown) and/or eschar (tan, brown or black) in the wound bed.  ?Wound Description (Comments): covered in escar and red around wound  ?Present on Admission: Yes  ? ? ? ? ? ? ?Pneumonia ?Presume post-obstructive PNA ?--received 3 days of zosyn ?--transition to IV Unasyn today ? ?Malnutrition of moderate degree ?Due to chronic diseases, metastatic cancer, poor oral intake. ?Appreciate dietitian's recommendations. ? ?Hypoglycemia ?Secondary to poor oral intake ?Hold oral hypoglycemic agents ?--Ensure mixed with milk ? ? 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 ?Depression ?Continue sertraline ? ? ? ? ?  ? ?Subjective:  ?Dysuria resolved.  Pt was intermittently able to void.  Complained of pain in right calf during PT session. ? ? ?Physical Exam: ? ?Constitutional: NAD, AAOx3 ?HEENT: conjunctivae and lids normal, EOMI ?CV: No cyanosis.   ?RESP: normal respiratory effort, on RA ?Extremities: No effusions, edema in BLE ?SKIN: warm, dry ?Neuro: II - XII grossly intact.   ?Psych: depressed mood and affect.   ? ? ?Data Reviewed: ? ?Family Communication: husband and daughter-in-law updated at bedside today ? ?Disposition: ?Status is: Inpatient ?Remains inpatient appropriate because: pending SNF acceptance  ? ? Planned Discharge Destination: Skilled nursing facility ? ? ? ?Time spent: 100 minutes ? ?Author: ?Enzo Bi, MD ?08/07/2021 8:17 PM ? ?For on call review [email or web address].  ?

## 2021-08-08 ENCOUNTER — Inpatient Hospital Stay: Payer: Medicare Other

## 2021-08-08 ENCOUNTER — Telehealth: Payer: Self-pay | Admitting: Internal Medicine

## 2021-08-08 DIAGNOSIS — W19XXXA Unspecified fall, initial encounter: Secondary | ICD-10-CM | POA: Diagnosis not present

## 2021-08-08 DIAGNOSIS — S32050K Wedge compression fracture of fifth lumbar vertebra, subsequent encounter for fracture with nonunion: Secondary | ICD-10-CM | POA: Diagnosis not present

## 2021-08-08 LAB — CBC
HCT: 26.6 % — ABNORMAL LOW (ref 36.0–46.0)
Hemoglobin: 8.4 g/dL — ABNORMAL LOW (ref 12.0–15.0)
MCH: 28.1 pg (ref 26.0–34.0)
MCHC: 31.6 g/dL (ref 30.0–36.0)
MCV: 89 fL (ref 80.0–100.0)
Platelets: 121 10*3/uL — ABNORMAL LOW (ref 150–400)
RBC: 2.99 MIL/uL — ABNORMAL LOW (ref 3.87–5.11)
RDW: 18.4 % — ABNORMAL HIGH (ref 11.5–15.5)
WBC: 16.3 10*3/uL — ABNORMAL HIGH (ref 4.0–10.5)
nRBC: 0.1 % (ref 0.0–0.2)

## 2021-08-08 LAB — BASIC METABOLIC PANEL
Anion gap: 10 (ref 5–15)
BUN: 20 mg/dL (ref 8–23)
CO2: 21 mmol/L — ABNORMAL LOW (ref 22–32)
Calcium: 8.5 mg/dL — ABNORMAL LOW (ref 8.9–10.3)
Chloride: 108 mmol/L (ref 98–111)
Creatinine, Ser: 1.24 mg/dL — ABNORMAL HIGH (ref 0.44–1.00)
GFR, Estimated: 44 mL/min — ABNORMAL LOW (ref >60)
Glucose, Bld: 145 mg/dL — ABNORMAL HIGH (ref 70–99)
Potassium: 3.5 mmol/L (ref 3.5–5.1)
Sodium: 139 mmol/L (ref 135–145)

## 2021-08-08 LAB — MAGNESIUM: Magnesium: 2 mg/dL (ref 1.7–2.4)

## 2021-08-08 MED ORDER — SODIUM CHLORIDE 0.9 % IV SOLN
3.0000 g | Freq: Three times a day (TID) | INTRAVENOUS | Status: AC
  Administered 2021-08-08: 3 g via INTRAVENOUS
  Filled 2021-08-08: qty 8

## 2021-08-08 MED ORDER — ENOXAPARIN SODIUM 100 MG/ML IJ SOSY: 1.5000 mg/kg | PREFILLED_SYRINGE | INTRAMUSCULAR | Status: DC

## 2021-08-08 MED ORDER — ENOXAPARIN SODIUM 100 MG/ML IJ SOSY
1.5000 mg/kg | PREFILLED_SYRINGE | INTRAMUSCULAR | Status: DC
  Filled 2021-08-08: qty 1.02

## 2021-08-08 MED ORDER — ENOXAPARIN SODIUM 150 MG/ML IJ SOSY
1.5000 mg/kg | PREFILLED_SYRINGE | INTRAMUSCULAR | Status: DC
  Administered 2021-08-08: 102 mg via SUBCUTANEOUS
  Filled 2021-08-08: qty 0.68

## 2021-08-08 MED ORDER — ENOXAPARIN SODIUM 100 MG/ML IJ SOSY: 1.5000 mg/kg | PREFILLED_SYRINGE | INTRAMUSCULAR | Status: AC

## 2021-08-08 MED ORDER — HYDROCODONE-ACETAMINOPHEN 5-325 MG PO TABS: 1.0000 | ORAL_TABLET | Freq: Four times a day (QID) | ORAL | 0 refills | Status: AC | PRN

## 2021-08-08 MED ORDER — LIDOCAINE 5 % EX PTCH: 1.0000 | MEDICATED_PATCH | CUTANEOUS | 0 refills | Status: AC

## 2021-08-08 MED ORDER — ENSURE ENLIVE PO LIQD: 237.0000 mL | Freq: Two times a day (BID) | ORAL | Status: AC

## 2021-08-08 MED ORDER — GABAPENTIN 100 MG PO CAPS: 100.0000 mg | ORAL_CAPSULE | Freq: Every day | ORAL | Status: AC

## 2021-08-08 NOTE — Progress Notes (Signed)
Fourth and final attempt to call report to facility with no answer at nurses station and no option to leave message. Pt leaving with transport discharge instructions in discharge packet ?

## 2021-08-08 NOTE — Telephone Encounter (Signed)
#  Patient not seen in the hospital.  ? ?Message from the hospitalist, Dr.Lai- that found pt to have DVT in her right LE.  Pt was on Eliquis, although Eliquis. However, Eliquis was held for right-sided carpal tunnel release, which occurred on 07/15/2021, but it was resumed during her hospitalization in early March.  Pt has lung cancer with mets.  ? ?#Given the new DVT-I would recommend Lovenox 1.5 mg/kg once a day for at least 2 weeks.  Discontinue Eliquis for now ? ?#Patient has appointment with Dr. Tasia Catchings on 3/24.  This could be addressed at the visit.  ? ?Thanks, ?GB ?

## 2021-08-08 NOTE — Progress Notes (Signed)
First attempt to call report to [phone] and was hung up on ?

## 2021-08-08 NOTE — Progress Notes (Signed)
Second attempt to call report to [phone] and no answer at nurses station, no option to leave a message ?

## 2021-08-08 NOTE — Progress Notes (Signed)
Third attempt to call report to [facility] and no answer or option to leave message ?

## 2021-08-08 NOTE — Discharge Summary (Addendum)
? ?Physician Discharge Summary ? ? ?Natasha [name]  female DOB: Dec 26, 1940  ?IDU:[id number] ? ?PCP: Lesleigh Noe, MD ? ?Admit date: 08/04/2021 ?Discharge date: 08/08/2021 ? ?Admitted From: SNF rehab ?Disposition:  SNF rehab ?CODE STATUS: DNR ? ?Discharge Instructions   ? ? Discharge wound care:   Complete by: As directed ?  ? Wound care  Daily      ?Wound care to left ischial tuberosity:  Cleanse with soap and water, rinse and pat dry. Apply MediHoney to wound in a thin layer, cover with saline dampened gauze, cover with dry gauze and secure with silicone foam. Change daily and PRN soiling. ? ? ?Wound care  Every shift      ?Wound care to left heel DTPI:  cleanse with NS, pat dry. Cover with silicone foam for heel. Change daily. Float heel. ?- ?-  ? ?  ? ? ? ?Hospital Course:  ?For full details, please see H&P, progress notes, consult notes and ancillary notes.  ?Briefly,  ?Natasha [name] is a 81 y.o. female with medical history significant for recent acute embolic CVA, hypertension, A-fib on chronic anticoagulation, diabetes mellitus type 2, depression, history of metastatic lung cancer status post radiation therapy.  She presented to the ED on 08/04/2021 from SNF after a fall when attempting to get out of bed.  She was discharged to SNF for rehab on 3/9.  Since discharge, appetite reportedly has been very poor and she has been extremely weak.  Since fall, having lower back pain.  She was found to have a compression fracture at L5.  Neurosurgery was consulted and recommend LSO brace and conservative management.  ?  ?Pt had a Foley placed at SNF due to pain and discomfort with use of bedpan.  Prior admission, Foley had been used due to acute retention, removed prior to discharge with patient requiring intermittent in/out caths but voiding had improved.  UA on admission this time appears consistent with UTI.  Pt just completed antibiotics for UTI last admission. ?  ?CT of chest showed LUL mass with obstruction of the  left upper lobe bronchus with worsening LUL consolidation, likely combination of tumor and post-obstructive pneumonia and/or atelectasis. ? ?* Fall ?Patient is status post fall due to generalized weakness related to poor oral intake as well as recent diagnosis of metastatic lung cancer. ?She has an L5 compression fracture following a fall ?--Fall precautions ?--PT/OT rec continued SNF rehab. ? 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  ?Post-obstructive PNA ?--pt received 5 days of IV antibiotics with zosyn f/b Unasyn.  Pt was not having respiratory distress and was on room air prior to discharge. ? ? Lung cancer metastatic to brain [facility]) ?Patient recently diagnosed with lung cancer with metastasis to liver, brain and bone. ?Seen by oncology last admission, started on oral Tagrisso.  Pt reportedly has not yet started on this medication. ?CT scan of the chest shows left upper lobe/suprahilar mass with obstruction of the left upper lobe bronchus and worsening left upper lobe consolidation, likely combination of tumor and postobstructive pneumonia and/or atelectasis. ?--Pt was treated with a course of IV abx for presumed post-obstructive PNA. ?--Palliative care provider saw pt and husband during this admission, and per note, pt wants to continue cancer treatment. ?--Pt has already scheduled f/u appointment with oncology Dr. Tasia Catchings on 08/15/21. ?--Pt's family is  responsible for getting Tagrisso to the facility for pt to take. ? ?History of embolic stroke ?Patient was recently diagnosed with an acute embolic stroke during last hospitalization.   ?She has a history of atrial fibrillation and was on Eliquis.   ?--Eliquis switched to treatment-dose Lovenox prior to discharge due to acute DVT that likely developed even  while on Eliquis.   ?--Pt will continue lovenox and follow up with oncology for decision on future choice of anticoagulation. ?  ?Acute RLE DVT ?Pt complained of pain in her right calf, so US venous study obtained which was Positive for deep venous thrombosis in the right lower extremity.  Thrombus involving the right common femoral vein, right femoral ?vein, right profunda femoral vein and right deep calf veins.  Per discussion with radiologist, DVT's appeared acute to subacute. ?--Pt was on Eliquis, although Eliquis was held for right-sided carpal tunnel release, which occurred on 07/15/2021, but it was resumed during her hospitalization in early March. ?--After discussion with oncall oncology Dr. Rogue Bussing, decision was made to switch pt to treatment-dose lovenox 1.5 mg/kg once a day for at least 2 weeks.  Pt will follow up with primary oncologist Dr. Tasia Catchings on 08/15/21 to discuss choice of anticoagulation for the future. ? ?UTI (urinary tract infection) 2/2 yeast ?Patient was recently treated for E. coli UTI in the hospital and discharged to the skilled nursing facility on Keflex. ?She had a Foley catheter inserted at SNF, for unclear reason and presented with Foley. ?UA on admission with pyuria.  Pt reported dysuria.  Urine cx grew yeast.   ?--Pt received treatment with Diflucan.  Foley removed. ? ?Intermittent urinary retention ?--Pt presented from SNF with Foley, which was removed during this hospitalization.  After Foley removed, pt did require 1 In/Out cath, but then was able to void on her own.  Pt will need to be monitored for urinary retention, but try to use I/O cath instead of inserting chronic Foley to avoid further episodes of UTI. ? ?Type II diabetes mellitus with renal manifestations (HCC) ?--A1c 8.3, poorly controlled ?--BG within inpatient goal without need for SSI. ?--discharged on home regimen as below. ?  ?Paroxysmal atrial fibrillation ([place]) ?Continue amiodarone. ?Home Eliquis switched to  treatment-dose Lovenox (see above). ?  ?Essential hypertension ?BP controlled without BP meds. ?  ?Pressure injury of skin ?Patient noted to have deep tissue injury to the buttocks and stage II pressure injury involving the coccyx. ?Frequent turning to prevent development of further pressure ulcers ?Continue to apply barrier cream to buttocks ?Continue zinc oxide and ascorbic acid to help with wound healing ?  ?Pressure Injury 08/04/21 Buttocks Left Unstageable - Full thickness tissue loss in which the base of the injury is covered by slough (yellow, tan, gray, green or brown) and/or eschar (tan, brown or black) in the wound bed. covered in escar and red around (Active)  ?08/04/21 1044  ?Location: Buttocks  ?Location Orientation: Left  ?Staging: Unstageable - Full thickness tissue loss in which the base of the injury is covered by slough (yellow, tan, gray, green or brown) and/or eschar (tan, brown or black) in the wound bed.  ?Wound Description (Comments): covered in escar and red around wound  ?Present on Admission: Yes  ?  ?   ?Malnutrition of moderate degree ?Due to chronic diseases, metastatic cancer, poor oral intake. ?--Pt complained about Ensure being too sweet, so rec mixing Ensure with milk 1:1 to make it more palatable for pt.   ?  ?Hypoglycemia ?Secondary  to poor oral intake ?  ?Depression ?Continue sertraline ?  ? ?Discharge Diagnoses:  ?Principal Problem: ?  Fall ?Active Problems: ?  History of embolic stroke ?  Lung cancer metastatic to brain [facility]) ?  UTI (urinary tract infection) ?  Paroxysmal atrial fibrillation (HCC) ?  Type II diabetes mellitus with renal manifestations ([place]) ?  Essential hypertension ?  Pressure injury of skin ?  Cancer of upper lobe of left lung ([place]) ?  Depression ?  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  Hypoglycemia ?  Malnutrition of moderate degree ?  Pneumonia ? ? ?30 Day Unplanned Readmission Risk Score   ? ?Flowsheet Row ED to Hosp-Admission  (Current) from 08/04/2021 in [place]  ?30 Day Unplanned Readmission Risk Score (%) 49.64 Filed at 08/08/2021 0801  ? ?  ? ? This score is the patient's risk of an unplanned

## 2021-08-08 NOTE — TOC Progression Note (Signed)
Transition of Care (TOC) - Progression Note  ? ? ?Patient Details  ?Name: Natasha [name] ?MRN: [id number] ?Date of Birth: 08/11/1940 ? ?Transition of Care (TOC) CM/SW Contact  ?Pete Pelt, RN ?Phone Number: ?08/08/2021, 11:00 AM ? ?Clinical Narrative:   Patient will transfer to [facility], room 605P today via [facility].  Spouse and patient aware. ? ? ? ?Expected Discharge Plan: [place] ?Barriers to Discharge: Continued Medical Work up ? ?Expected Discharge Plan and Services ?Expected Discharge Plan: [place] ?  ?Discharge Planning Services: CM Consult ?Post Acute Care Choice: [place] ?Living arrangements for the past 2 months: [place] ?Expected Discharge Date: 08/08/21               ?  ?  ?  ?  ?  ?  ?  ?  ?  ?  ? ? ?Social Determinants of Health (SDOH) Interventions ?  ? ?Readmission Risk Interventions ?Readmission Risk Prevention Plan 08/07/2021 08/05/2021  ?Transportation Screening Complete Complete  ?Medication Review [employer]) - Complete  ?PCP or Specialist appointment within 3-5 days of discharge - Complete  ?[place] or Home Care Consult - Complete  ?SW Recovery Care/Counseling Consult - Not Complete  ?SW Consult Not Complete Comments - RNCM assigned to case  ?Palliative Care Screening - Not Applicable  ?Skilled Nursing Facility - Complete  ?Some recent data might be hidden  ? ? ?

## 2021-08-08 NOTE — Progress Notes (Signed)
?   08/08/21 1000  ?Clinical Encounter Type  ?Visited With Patient and family together  ?Visit Type Follow-up;Social support  ? ?Chaplain provided social and emotional support through presence and conversation. ?

## 2021-08-08 NOTE — Care Management Important Message (Signed)
Important Message ? ?Patient Details  ?Name: Natasha [name] ?MRN: [id number] ?Date of Birth: 09/11/1940 ? ? ?Medicare Important Message Given:  Yes ? ?I reviewed the Important Message from Medicare with the patient's HCPOA, [name] and he is in agreement with the discharge plan.  I wished her well and thanked him for his time. ? ? ?[name] ?08/08/2021, 10:02 AM ?

## 2021-08-09 LAB — CULTURE, BLOOD (ROUTINE X 2)
Culture: NO GROWTH
Culture: NO GROWTH

## 2021-08-11 ENCOUNTER — Encounter: Payer: Self-pay | Admitting: Oncology

## 2021-08-12 ENCOUNTER — Ambulatory Visit: Payer: Medicare Other | Admitting: Primary Care

## 2021-08-12 NOTE — Progress Notes (Deleted)
? ?'@Patient'$  ID: Natasha [name], female    DOB: March 28, 1941, 81 y.o.   MRN: [id number] ? ?No chief complaint on file. ? ? ?Referring provider: ?Lesleigh Noe, MD ? ?HPI: ?81 year old female, former passive smoker (12-pack-year history).  Past medical history significant for hypertension, paroxysmal A-fib, chronic diastolic heart failure, ischemic stroke, COPD, lung cancer, OSA, GERD, type 2 diabetes, brain metastases, chronic kidney disease stage IV. ? ?08/12/2021 ?Patient presents today for 74-month follow-up.  ? ? ?CPAP compliance? ?Breztri ?Pulmonary function testing in January 2023 showed mild COPD ? ?Allergies  ?Allergen Reactions  ? Atorvastatin   ?  Muscle/joint aches  ? Lantus [Insulin Glargine] Hives  ? Lyrica [Pregabalin] Other (See Comments)  ?  Headache, disorientation  ? Lisinopril Hives and Cough  ?   ?  ? ? ?Immunization History  ?Administered Date(s) Administered  ? Fluad Quad(high Dose 65+) 02/26/2021  ? Influenza Split 03/20/2014  ? Influenza, High Dose Seasonal PF 05/08/2020  ? Influenza,inj,Quad PF,6+ Mos 03/13/2017, 03/17/2018  ? Influenza-Unspecified 03/17/2012, 03/23/2013, 03/22/2014, 03/22/2015, 03/25/2016, 02/23/2019  ? Moderna SARS-COV2 Booster Vaccination 07/26/2019  ? Moderna Sars-Covid-2 Vaccination 06/28/2019, 05/14/2020  ? Pneumococcal Conjugate-13 10/25/2006, 05/11/2014  ? Pneumococcal Polysaccharide-23 10/31/2008, 04/06/2012  ? ? ?Past Medical History:  ?Diagnosis Date  ? A-fib ([place])   ? a.) CHA2DS2-VASc Score = 6 (age x 2, sex, HTN, aortic plaque, T2DM). b.) rate/rhythm maintained on oral amiodarone + metoprolol succinate; chronically anticoagulated with full dose apixaban  ? Anemia   ? 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 Aortic atherosclerosis ([place])   ? Arthritis   ? Atrial flutter with rapid ventricular response ([place]) 02/24/2021  ? a.) in the setting of (+) SARS-CoV-2 infection; converted to NSR with increased dose of oral amiodarone.  ? Chronic cough   ? CKD (chronic kidney  disease), stage III ([place])   ? Complication of anesthesia   ? COPD (chronic obstructive pulmonary disease) ([place])   ? Depression   ? Diastolic dysfunction   ? a.) TTE 04/08/2014: EF 60%; mild concentric LVH; G2DD. b.) TTE 12/21/2019: EF 60-65%, LA mildly dilated, mild-mod MR; PASP 36.8; G1DD. c.) TTE 02/25/2021: EF 60-65%; normal LV function with mild concentric LVH; G1DD  ? Diverticulitis   ? Dyspnea   ? High cholesterol   ? History of 2019 novel coronavirus disease (COVID-19) 02/24/2021  ? History of hiatal hernia   ? History of kidney stones   ? Hypertension   ? Insomnia   ? Long term current use of anticoagulant   ? a.) apixaban  ? Lumbar spinal stenosis   ? Mild asthma   ? Murmur   ? Non-small cell carcinoma of left lung, stage 1 ([place]) 09/30/2020  ? a.) clinical stage 1 (cT1cN0cM0). b.) treated with SBRT (60 cGy over 5 fractions).  ? OSA on CPAP   ? Osteoporosis   ? Restless leg   ? Sepsis ([place])   ? T2DM (type 2 diabetes mellitus) ([place])   ? ? ?Tobacco History: ?Social History  ? ?Tobacco Use  ?Smoking Status Former  ? Packs/day: 1.00  ? Years: 12.00  ? Pack years: 12.00  ? Types: Cigarettes  ? Quit date: 05/26/1975  ? Years since quitting: 46.2  ? Passive exposure: Past  ?Smokeless Tobacco Never  ? ?Counseling given: Not Answered ? ? ?Outpatient Medications Prior to Visit  ?Medication Sig Dispense Refill  ? acetaminophen (TYLENOL) 325 MG tablet Take 2 tablets (650 mg total) by mouth every 6 (six) hours as needed for  mild pain or fever.    ? amiodarone (PACERONE) 400 MG tablet Take 400 mg by mouth daily.    ? Calcium Carb-Cholecalciferol (CALCIUM 600+D3 PO) Take 1 tablet by mouth in the morning.    ? chlorhexidine (PERIDEX) 0.12 % solution Use as directed 15 mLs in the mouth or throat 2 (two) times daily. (Use after brushing)    ? collagenase (SANTYL) 250 UNIT/GM ointment Apply topically daily. (Patient taking differently: Apply 1 application. topically See admin instructions. Apply to wound (left buttock) once  daily as scheduled and as needed for wound care) 15 g 0  ? empagliflozin (JARDIANCE) 25 MG TABS tablet Take 25 mg by mouth daily.    ? enoxaparin (LOVENOX) 100 MG/ML injection Inject 1.025 mLs (102.5 mg total) into the skin daily for 14 days.    ? feeding supplement (ENSURE ENLIVE / ENSURE PLUS) LIQD Take 237 mLs by mouth 2 (two) times daily between meals. Mix it with milk to reduce sweetness.    ? ferrous sulfate 325 (65 FE) MG tablet Take 325 mg by mouth 2 (two) times daily with a meal.    ? gabapentin (NEURONTIN) 100 MG capsule Take 1 capsule (100 mg total) by mouth at bedtime.    ? glucose blood (CONTOUR NEXT TEST) test strip Check sugar twice daily DX E11.69 100 each 12  ? HYDROcodone-acetaminophen (NORCO/VICODIN) 5-325 MG tablet Take 1 tablet by mouth every 6 (six) hours as needed for up to 5 days for moderate pain or severe pain. 20 tablet 0  ? levalbuterol (XOPENEX HFA) 45 MCG/ACT inhaler Inhale 2 puffs into the lungs every 8 (eight) hours as needed for wheezing or shortness of breath (cough). 1 each 2  ? lidocaine (LIDODERM) 5 % Place 1 patch onto the skin daily. Remove & Discard patch within 12 hours or as directed by MD 30 patch 0  ? Multiple Vitamin (MULTIVITAMIN WITH MINERALS) TABS tablet Take 1 tablet by mouth daily.    ? osimertinib mesylate (TAGRISSO) 80 MG tablet Take 1 tablet (80 mg total) by mouth daily. 30 tablet 0  ? pantoprazole (PROTONIX) 40 MG tablet Take 40 mg by mouth daily.    ? polyethylene glycol (MIRALAX / GLYCOLAX) 17 g packet Take 17 g by mouth daily. 14 each 0  ? pramipexole (MIRAPEX) 1 MG tablet Take 1 tablet (1 mg total) by mouth at bedtime. Appt with PCP needed for further refills. 90 tablet 3  ? rosuvastatin (CRESTOR) 10 MG tablet Take 1 tablet (10 mg total) by mouth at bedtime.    ? sertraline (ZOLOFT) 50 MG tablet Take 1 tablet (50 mg total) by mouth at bedtime. 90 tablet 1  ? sitaGLIPtin-metformin (JANUMET) 50-1000 MG tablet Take 2 tablets by mouth in the morning.    ? TRESIBA  FLEXTOUCH 100 UNIT/ML FlexTouch Pen Inject 15 Units into the skin daily.    ? Vitamin D, Ergocalciferol, (DRISDOL) 1.25 MG (50000 UNIT) CAPS capsule Take 1 capsule (50,000 Units total) by mouth every 7 (seven) days. 8 capsule 0  ? ?No facility-administered medications prior to visit.  ? ? ? ? ?Review of Systems ? ?Review of Systems ? ? ?Physical Exam ? ?There were no vitals taken for this visit. ?Physical Exam  ? ?Lab Results: ? ?CBC ?   ?Component Value Date/Time  ? WBC 16.3 (H) 08/08/2021 0505  ? RBC 2.99 (L) 08/08/2021 0505  ? HGB 8.4 (L) 08/08/2021 0505  ? HCT 26.6 (L) 08/08/2021 0505  ? PLT 121 (L)  08/08/2021 0505  ? MCV 89.0 08/08/2021 0505  ? MCH 28.1 08/08/2021 0505  ? MCHC 31.6 08/08/2021 0505  ? RDW 18.4 (H) 08/08/2021 0505  ? LYMPHSABS 1.1 08/04/2021 1014  ? MONOABS 1.0 08/04/2021 1014  ? EOSABS 0.0 08/04/2021 1014  ? BASOSABS 0.0 08/04/2021 1014  ? ? ?BMET ?   ?Component Value Date/Time  ? NA 139 08/08/2021 0505  ? K 3.5 08/08/2021 0505  ? CL 108 08/08/2021 0505  ? CO2 21 (L) 08/08/2021 0505  ? GLUCOSE 145 (H) 08/08/2021 0505  ? BUN 20 08/08/2021 0505  ? CREATININE 1.24 (H) 08/08/2021 0505  ? CREATININE 1.84 (H) 06/23/2021 1526  ? CALCIUM 8.5 (L) 08/08/2021 0505  ? GFRNONAA 44 (L) 08/08/2021 0505  ? ? ?BNP ?No results found for: BNP ? ?ProBNP ?No results found for: PROBNP ? ?Imaging: ?DG Chest 2 View ? ?Result Date: 08/04/2021 ?CLINICAL DATA:  Sepsis.  Lung cancer.  COPD. EXAM: CHEST - 2 VIEW COMPARISON:  Plain film 05/16/2021.  CT of 07/17/2021. FINDINGS: Degraded lateral view secondary to position. Midline trachea. Normal heart size. Mildly low lung volumes. No pleural effusion or pneumothorax. the right lung is clear. Left apical opacity is felt to be slightly increased compared to 07/17/2021 scout film from CT. Mild left base airspace disease. Left shoulder arthroplasty. IMPRESSION: Left apical opacity is felt to be slightly increased. Likely the central tumor from 07/17/2021, possibly with surround  postobstructive pneumonitis or in the appropriate clinical setting, radiation change. Mild left base airspace disease which could represent atelectasis or minimal infection/aspiration. Electronically [employer]

## 2021-08-13 ENCOUNTER — Other Ambulatory Visit: Payer: Self-pay

## 2021-08-13 ENCOUNTER — Non-Acute Institutional Stay: Payer: Medicare Other | Admitting: Student

## 2021-08-13 DIAGNOSIS — Z515 Encounter for palliative care: Secondary | ICD-10-CM

## 2021-08-13 DIAGNOSIS — R52 Pain, unspecified: Secondary | ICD-10-CM

## 2021-08-13 DIAGNOSIS — L8996 Pressure-induced deep tissue damage of unspecified site: Secondary | ICD-10-CM

## 2021-08-13 DIAGNOSIS — C3490 Malignant neoplasm of unspecified part of unspecified bronchus or lung: Secondary | ICD-10-CM

## 2021-08-13 DIAGNOSIS — L89320 Pressure ulcer of left buttock, unstageable: Secondary | ICD-10-CM

## 2021-08-13 NOTE — Progress Notes (Signed)
? ? ?Manufacturing engineer ?Community Palliative Care Consult Note ?Telephone: [phone]  ?Fax: [phone]  ? ?Date of encounter: 08/13/21 ?10:52 AM ?PATIENT NAME: Natasha [name] ?[address] ?[place] [address]   ?[phone] (home)  ?DOB: [birth year] ?MRN: [id number] ?PRIMARY CARE PROVIDER:    ?Lesleigh Noe, MD,  ?[place] [address] ?[phone] ? ?REFERRING PROVIDER:   ?Vilinda Boehringer, NP ? ?RESPONSIBLE PARTY:    ?Contact Information   ? ? Name Relation Home Work Mobile  ? [place] Spouse [phone]  [phone]  ? Marlena Clipper   [phone]  ? Craft,Jennifer Daughter   [phone]  ? ?  ? ? ? ?I met face to face with patient and family in the facility. Palliative Care was asked to follow this patient by consultation request of  Vilinda Boehringer, NP to address advance care planning and complex medical decision making. This is the initial visit.  ? ? ?                                 ASSESSMENT AND PLAN / RECOMMENDATIONS:  ? ?Advance Care Planning/Goals of Care: Goals include to maximize quality of life and symptom management. Patient/health care surrogate gave his/her permission to discuss.Our advance care planning conversation included a discussion about:    ?The value and importance of advance care planning  ?Experiences with loved ones who have been seriously ill or have died  ?Exploration of personal, cultural or spiritual beliefs that might influence medical decisions  ?Exploration of goals of care in the event of a sudden injury or illness  ?Reviewed code status; continue DNR ? ?CODE STATUS: DNR ? ?Education provided on palliative medicine versus hospice services.  Patient is currently under skilled services due to wound.  She has had functional decline in the past several days.  Husband wishes for comfort and expresses comfort path for patient, no aggressive treatments. We discussed transitioning to hospice services.  It is uncertain if  patient will remain at the facility or return home with hospice as husband expresses patient would need additional 24/7 caregiver support at home. Palliative NP discussed with facility NP and ordered comfort medications. ? ?I spent 20 minutes providing this consultation. More than 50% of the time in this consultation was spent in counseling and care coordination. ? ?------------------------------------------------------------------------------------------------ ?Symptom Management/Plan: ? ?Lung Cancer with metastasis to brain, bone, liver-it is unclear if patient had started Tagrisso at facility. Husband expresses comfort for patient and to not receive any aggressive treatments. Patient with general decline and appropriate for hospice; will order comfort medications to be in place. Patient will need 24/7 care and husband is uncertain if he can take her home with hospice or she remain at facility where she is currently under skilled care d/t wound.  ? ?Pain-due to cancer, L5 compression fracture. Continue norco 5/316m TID routinely. Start morphine 554mevery 4 hours PRN pain.  ? ?Wounds-unstageable wound to right buttocks, DTI to left heel-continue treatments as directed per facility; air mattress on bed.  ? ?Follow up Palliative Care Visit: Palliative care will continue to follow for complex medical decision making, advance care planning, and clarification of goals. Return in 1 weeks or prn. ? ? ?This visit was coded based on medical decision making (MDM). ? ?PPS: 20% ? ?HOSPICE ELIGIBILITY/DIAGNOSIS: TBD ? ?Chief Complaint: Palliative Medicine initial consult.  ? ?HISTORY OF PRESENT ILLNESS:  Natasha [name] a 81  y.o. year old female  with lung cancer with metastasis to bone, brain and liver, COPD, CKD 4, chronic diastolic CHF, essential hypertension, depression, IDA, pressure injury of skin, thrombocytopenia, RLS, L5 compression fracture. Hospitalized 2/23-07/31/21 due to acute embolic stroke, posterior circulation  stroke, lung cancer with mets to bone and brain, UTI, paroxysmal atrial fibrillation. Returned to hospital 3/13-3/17/23 due to UTI, RLE DVT, pneumonia. ? ?Patient is currently at [facility] and rehab.  Facility staff and husband report patient having declining in the past 3 days. She is eating very little, she is eating bites. She is sleeping more.patient has an unstageable wound to her left buttocks and DTI to her left heel; buttocks wound was debrided yesterday per wound care provider. Currently has a foley catheter in place.  Her pain varies; staff does express pain with turning and repositioning and ADL care. ? ?Patient received resting in bed.  She does open her eyes to verbal and tactile stimulation.  She shakes her head yes and no to questions.  HPI and ROS primarily obtained from spouse and staff. ? ?History obtained from review of EMR, discussion with primary team, and interview with family, facility staff/caregiver and/or Ms. Silguero.  ?I reviewed available labs, medications, imaging, studies and related documents from the EMR.  Records reviewed and summarized above.  ? ?Physical Exam: ? ?Constitutional: NAD ?General: frail appearing, ill-appearing ?EYES: anicteric sclera, lids intact, no discharge  ?ENMT: intact hearing, oral mucous membranes dry, dentition intact ?CV: S1S2, RRR, 2+ pedal edema ?Pulmonary: LCTA, no increased work of breathing, no cough, room air ?Abdomen:  normo-active BS + 4 quadrants, soft and non tender, no ascites ?GU: deferred ?MSK: +sarcopenia, non-ambulatory ?Skin: warm and dry, no rashes or wounds on visible skin ?Neuro: +generalized weakness, increased somnolence ?Psych: non-anxious affect, A and O  ?Hem/lymph/immuno: no widespread bruising ?CURRENT PROBLEM LIST:  ?Patient Active Problem List  ? Diagnosis Date Noted  ? Pneumonia 08/07/2021  ? Malnutrition of moderate degree 08/05/2021  ? Fall 08/04/2021  ? 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 Hypoglycemia 08/04/2021  ? UTI (urinary tract infection) 07/30/2021  ? Depression 07/30/2021  ? Palliative care encounter   ? Posterior circulation stroke ([place])   ? Brain metastases ([place])   ? Pressure injury of skin 07/24/2021  ? Anemia   ? Thrombocytopenia ([place])   ? CVA (cerebral vascular accident) ([place]) 07/18/2021  ? Acute ischemic stroke ([place]) 07/17/2021  ? HLD (hyperlipidemia) 07/17/2021  ? Type II diabetes mellitus with renal manifestations ([place]) 07/17/2021  ? COPD (chronic obstructive pulmonary disease) ([place]) 07/17/2021  ? CKD (chronic kidney disease), stage IV ([place]) 07/17/2021  ? Chronic diastolic CHF (congestive heart failure) ([place]) 07/17/2021  ? Leukocytosis 07/17/2021  ? Bone lesion 07/17/2021  ? History of embolic stroke 76/81/1572  ? Lung cancer metastatic to brain [facility]) 07/17/2021  ? Carpal tunnel syndrome on right 07/17/2021  ? Lesion of liver   ? Vitamin B12 deficiency 07/14/2021  ? Localized swelling, mass and lump, neck 07/14/2021  ? Elevated sed rate 07/14/2021  ? Vitamin D deficiency 07/14/2021  ? CKD (chronic kidney disease) stage 4, GFR 15-29 ml/min ([facility]) 06/24/2021  ? Polyarthralgia 06/16/2021  ? Muscle weakness 06/16/2021  ? Hyperkalemia 06/16/2021  ? Unsteadiness on feet 06/16/2021  ? Acute leg pain, right 06/16/2021  ? Double vision 03/13/2021  ? Dizziness 03/13/2021  ? COVID-19 virus infection 02/24/2021  ? Atrial flutter ([place]) 02/24/2021  ? Chronic bilateral low back pain with  bilateral sciatica 02/17/2021  ? Decreased sensation 11/27/2020  ? Dermatitis 11/27/2020  ? Axillary lymphadenopathy 10/11/2020  ? Cancer of upper lobe of left lung ([place]) 10/11/2020  ? Goals of care, counseling/discussion 10/11/2020  ? Status post reverse total shoulder replacement, 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  ? Diverticulitis of colon 02/03/2020  ? Chronic pain syndrome 01/12/2019  ? Neuropathic pain 01/12/2019  ? Neuroforaminal stenosis of lumbar spine 11/02/2018  ? Sacroiliitis ([place]) 11/02/2018  ? Former smoker  09/23/2018  ? Spinal stenosis of lumbar region without neurogenic claudication 09/23/2018  ? Weakness of both hands 09/23/2018  ? Overweight (BMI 25.0-29.9) 08/10/2018  ? Insomnia 06/17/2018  ? Chronic kidney

## 2021-08-15 ENCOUNTER — Other Ambulatory Visit: Payer: Self-pay

## 2021-08-15 ENCOUNTER — Non-Acute Institutional Stay: Payer: Medicare Other | Admitting: Student

## 2021-08-15 ENCOUNTER — Inpatient Hospital Stay: Payer: Medicare Other | Admitting: Oncology

## 2021-08-15 ENCOUNTER — Inpatient Hospital Stay: Payer: Medicare Other | Attending: Oncology

## 2021-08-15 DIAGNOSIS — R52 Pain, unspecified: Secondary | ICD-10-CM

## 2021-08-15 DIAGNOSIS — Z515 Encounter for palliative care: Secondary | ICD-10-CM

## 2021-08-15 DIAGNOSIS — C3490 Malignant neoplasm of unspecified part of unspecified bronchus or lung: Secondary | ICD-10-CM

## 2021-08-15 NOTE — Progress Notes (Signed)
? ? ?Manufacturing engineer ?Community Palliative Care Consult Note ?Telephone: [phone]  ?Fax: [phone]  ? ? ?Date of encounter: 08/15/21 ?9:15 AM ?PATIENT NAME: Natasha [name] ?[address] ?[place] [address]   ?[phone] (home)  ?DOB: 1940/10/13 ?MRN: [id number] ?PRIMARY CARE PROVIDER:    ?Lesleigh Noe, MD,  ?[place] [address] ?[phone] ? ?REFERRING PROVIDER:   ?Vilinda Boehringer, NP ? ?RESPONSIBLE PARTY:    ?Contact Information   ? ? Name Relation Home Work Mobile  ? [place] Spouse [phone]  [phone]  ? Marlena Clipper   [phone]  ? Craft,Jennifer Daughter   [phone]  ? ?  ? ? ? ?I met face to face with patient and family in the facility. Palliative Care was asked to follow this patient by consultation request of  Vilinda Boehringer, NP to address advance care planning and complex medical decision making. This is a follow up visit. ? ?                                 ASSESSMENT AND PLAN / RECOMMENDATIONS:  ? ?Advance Care Planning/Goals of Care: Goals include to maximize quality of life and symptom management. Patient/health care surrogate gave his/her permission to discuss. ?Our advance care planning conversation included a discussion about:    ?The value and importance of advance care planning  ?Experiences with loved ones who have been seriously ill or have died  ?Exploration of personal, cultural or spiritual beliefs that might influence medical decisions  ?Exploration of goals of care in the event of a sudden injury or illness  ?Identification of a healthcare agent  ?CODE STATUS: DNR ? ?Continued education on palliative medicine vs. Hospice services. Patient does meet criteria for hospice, although she is on skilled days. Did discuss with facility NP of possibility of patient transitioning to hospice at facility; she is going to check with facility SW. Will continue to provide ongoing support.  ? ?Symptom Management/Plan: ? ?Lung  Cancer with metastasis to brain, bone, liver-patient with general decline. She is sleeping throughout the day; does arouse to stimulation. Staff encouraged to give comfort medications, morphine, norco and lorazepam on hand.  ? ?Pain-due to cancer, L5 compression fracture. Continue norco 5/39m TID routinely and morphine 535mevery 4 hours PRN pain.  ? ? ?Follow up Palliative Care Visit: Palliative care will continue to follow for complex medical decision making, advance care planning, and clarification of goals. Return 1 weeks or prn. ? ?This visit was coded based on medical decision making (MDM). ? ?PPS: 20%, weak ? ?HOSPICE ELIGIBILITY/DIAGNOSIS: TBD ? ?Chief Complaint: Palliative Medicine follow up visit.  ? ?HISTORY OF PRESENT ILLNESS:  MaTreana [name] a [age].o. year old female  with  lung cancer with metastasis to bone, brain and liver, COPD, CKD 4, chronic diastolic CHF, essential hypertension, depression, IDA, pressure injury of skin, thrombocytopenia, RLS, L5 compression fracture. Hospitalized 2/23-07/31/21 due to acute embolic stroke, posterior circulation stroke, lung cancer with mets to bone and brain, UTI, paroxysmal atrial fibrillation. Returned to hospital 3/13-3/17/23 due to UTI, RLE DVT, pneumonia.  ? ?Patient received resting in bed; she does arouse to verbal and tactile stimulation. Husband is at bedside. Patient does not exhibit any signs of discomfort. She is taking sips of fluids, not eating any food per husband. She is sleeping throughout the day. Patient did receive first dose of morphine during the night. She has  been comfortable per husband.   ? ?History obtained from review of EMR, discussion with primary team, and interview with family, facility staff/caregiver and/or Ms. Natasha [name].  ?I reviewed available labs, medications, imaging, studies and related documents from the EMR.  Records reviewed and summarized above.  ? ?ROS ? ?Unable to obtain from patient d/t lethargy. ? ?Physical  Exam: ?Weight: 134 pounds ?Constitutional: NAD ?General: frail appearing, ill appearing ?EYES: anicteric sclera, lids intact, no discharge  ?ENMT: intact hearing ?CV: S1S2, RRR, pedal edema ?Pulmonary: LCTA, no increased work of breathing, no cough, oxygen at 2 lpm ?Abdomen: hypo-active BS + 4 quadrants, soft and non tender, ?GU: deferred ?MSK: +sarcopenia, bed bound ?Skin: discoloration starting to bottom of feet, Allevyn dressing to left heel, ace wrap to left arm ?Neuro: + generalized weakness, increased somnolence ?Psych: non-anxious affect ?Hem/lymph/immuno: no widespread bruising ? ? ?Thank you for the opportunity to participate in the care of Ms. Natasha [name].  The palliative care team will continue to follow. Please call our office at [phone] if we can be of additional assistance.  ? ?Ezekiel Slocumb, NP  ? ?COVID-19 PATIENT SCREENING TOOL ?Asked and negative response unless otherwise noted:  ? ?Have you had symptoms of covid, tested positive or been in contact with someone with symptoms/positive test in the past 5-10 days? No ? ?

## 2021-08-18 ENCOUNTER — Telehealth: Payer: Self-pay | Admitting: *Deleted

## 2021-08-18 NOTE — Telephone Encounter (Signed)
Called pt's family to ask if they would like to reschedule her appt with Dr. Tasia Catchings. Spoke with pt's grandson who informed me that pt passed away this morning. Condolences offered. MD and clinical team made aware. Nothing further needed at this time. ?

## 2021-08-19 ENCOUNTER — Ambulatory Visit: Payer: Medicare Other

## 2021-08-23 DEATH — deceased

## 2021-08-27 ENCOUNTER — Ambulatory Visit: Payer: Medicare Other

## 2021-09-24 ENCOUNTER — Ambulatory Visit: Payer: Medicare Other | Admitting: Family Medicine

## 2021-10-06 ENCOUNTER — Other Ambulatory Visit: Payer: Medicare Other

## 2021-10-15 ENCOUNTER — Ambulatory Visit: Payer: Medicare Other | Admitting: Radiation Oncology

## 2023-02-02 IMAGING — DX DG CHEST 1V PORT
1 series · 1 of 1 positions shown · non-contrast
Comparison: Portable exam 6636 hours compared to 04/14/2017

Correlation: CT chest 09/04/2020

CLINICAL DATA: Post biopsy

EXAM:
PORTABLE CHEST 1 VIEW

[chest ap]
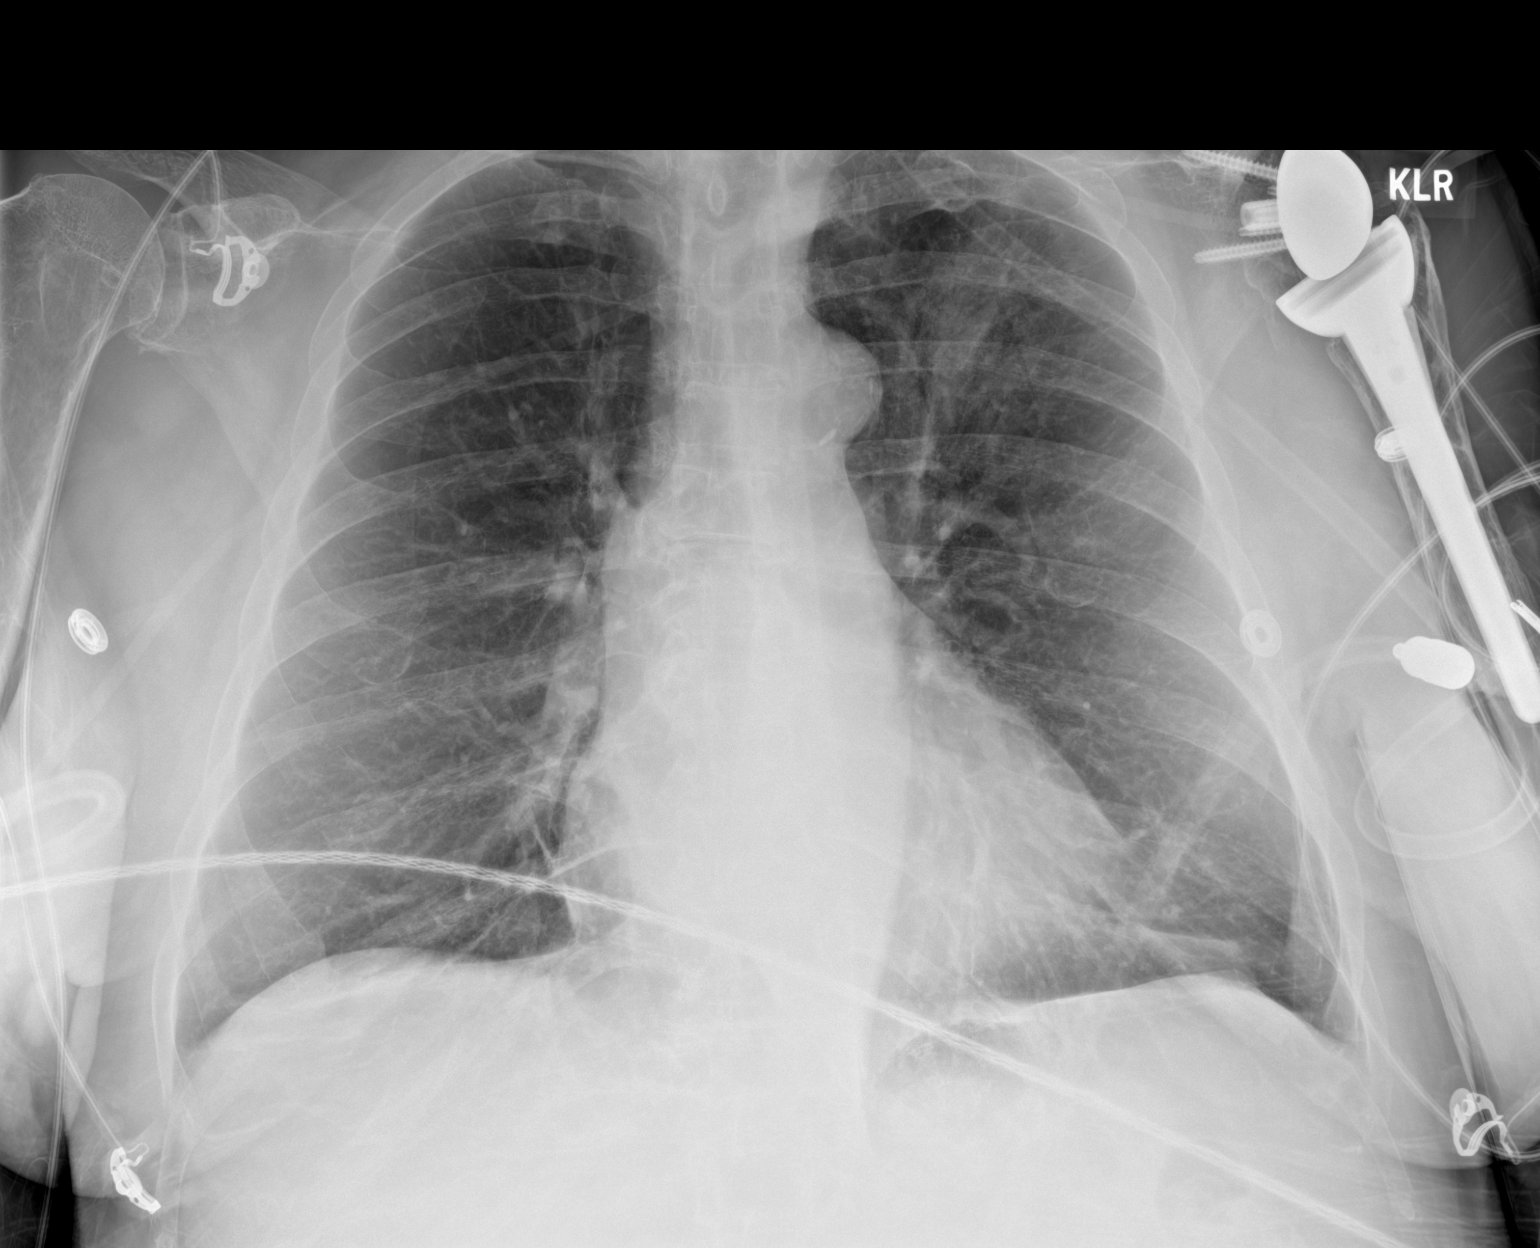

[1 of 1 positions shown; findings below may reference images not displayed]

FINDINGS: Normal heart size, mediastinal contours, and pulmonary vascularity.

Inter aorta

Infiltrate identified in LEFT upper lobe post biopsy of known LEFT
upper lobe nodule.

This may reflect edema or hemorrhage at the biopsy site.

Remaining lungs clear.

No pleural effusion or pneumothorax.
IMPRESSION: Mild infiltrate in LEFT upper lobe which may reflect mild edema or
hemorrhage secondary to biopsy of known nodule.

No pneumothorax.

## 2023-02-05 IMAGING — CT NM PET TUM IMG INITIAL (PI) SKULL BASE T - THIGH
10 series · 20 of 25 positions shown · non-contrast
Comparison: Chest CT September 04, 2020.

CLINICAL DATA: Initial treatment strategy for lung nodule.

EXAM:
NUCLEAR MEDICINE PET SKULL BASE TO THIGH
TECHNIQUE: 7.83 mCi F-18 FDG was injected intravenously. Full-ring PET imaging
was performed from the skull base to thigh after the radiotracer. CT
data was obtained and used for attenuation correction and anatomic
localization.
Fasting blood glucose: 87 mg/dl

[Series 3: ct wb 5.0 b30f · axial · 5.0mm · 0.98mm/px · z∈[-192,+240]mm · 2 of 290 slices shown]
[im 1/290]
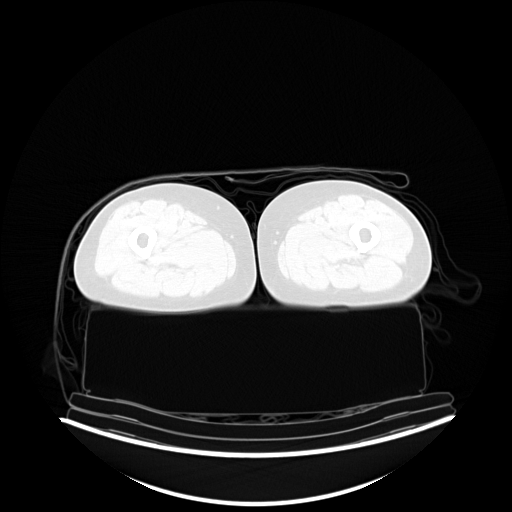
[im 145/290]
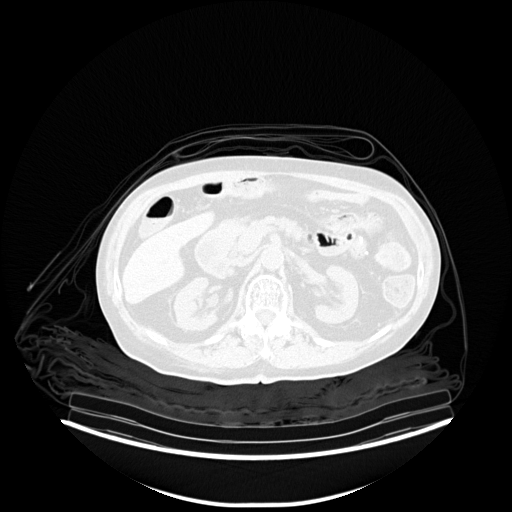

[Series 5: pet wb uncorrected (nac) · axial · 5.0mm · 4.07mm/px · z∈[-192,+676]mm · 3 of 290 slices shown]
[im 1/290]
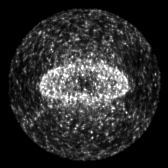
[im 145/290]
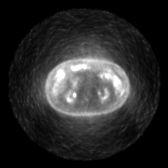
[im 290/290]
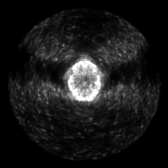

[Series 6: pet wb (ac) · axial · 5.0mm · 2.91mm/px · z∈[-192,+676]mm · 3 of 290 slices shown]
[im 1/290]
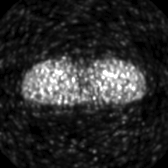
[im 193/290]
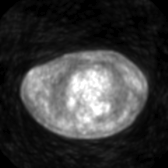
[im 290/290]
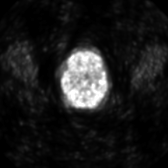

[Series 603: pet axial fused · 3 of 287 slices shown]
[im 1/287]
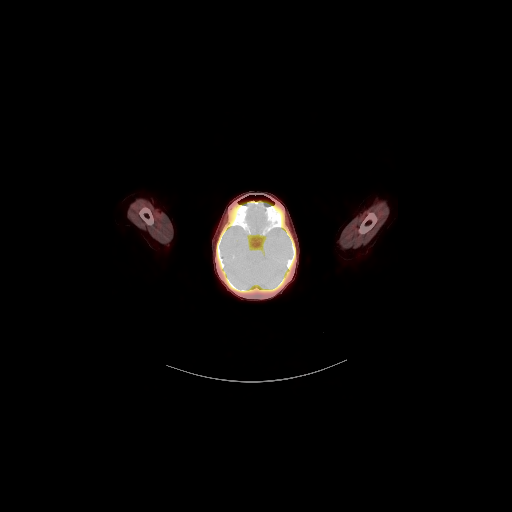
[im 96/287]
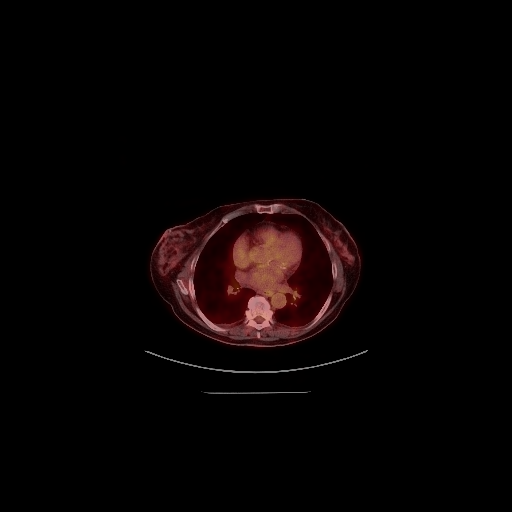
[im 287/287]
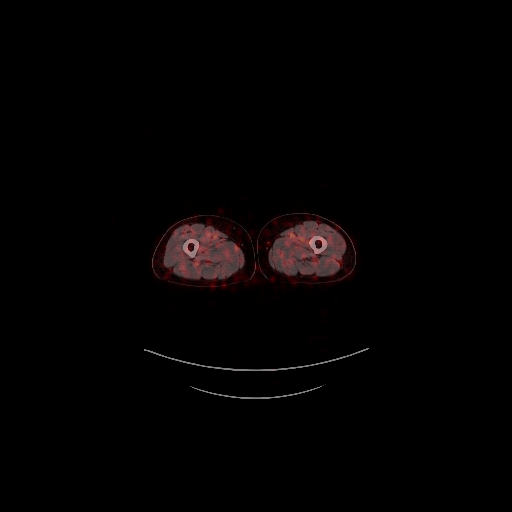

[Series 604: pet coronal fused · 1 of 62 slices shown]
[im 1/62]
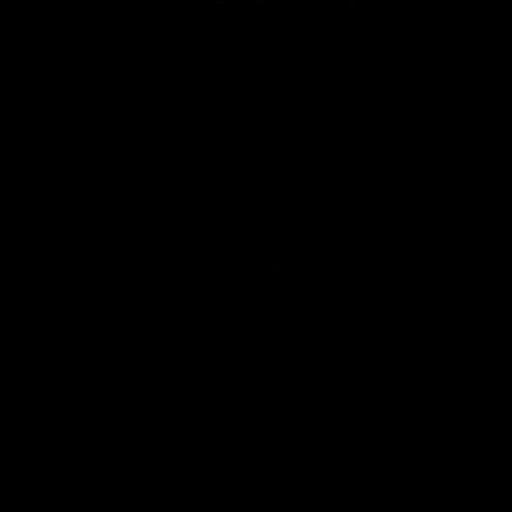

[Series 605: pet sagittal fused · 2 of 160 slices shown]
[im 1/160]
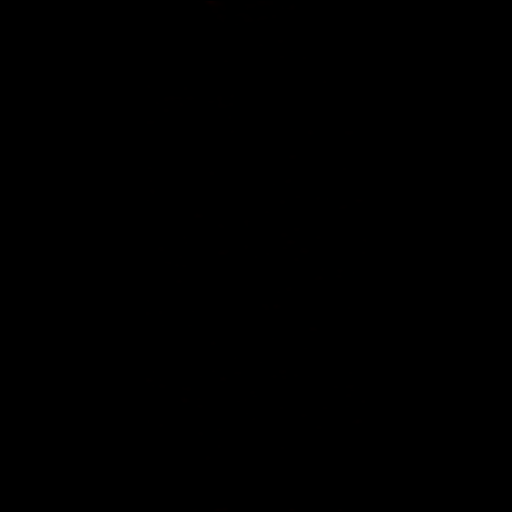
[im 160/160]
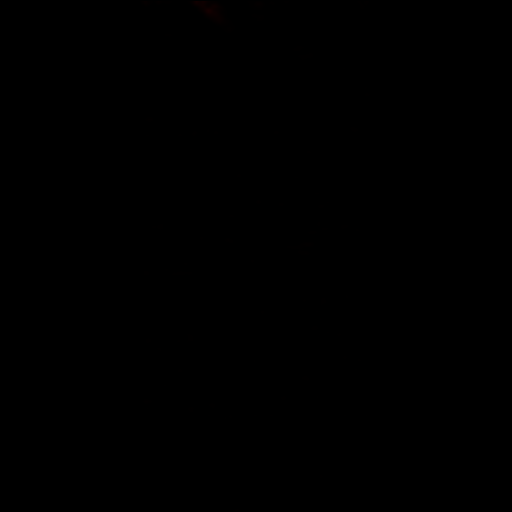

[Series 606: pet axial · 3 of 289 slices shown]
[im 97/289]
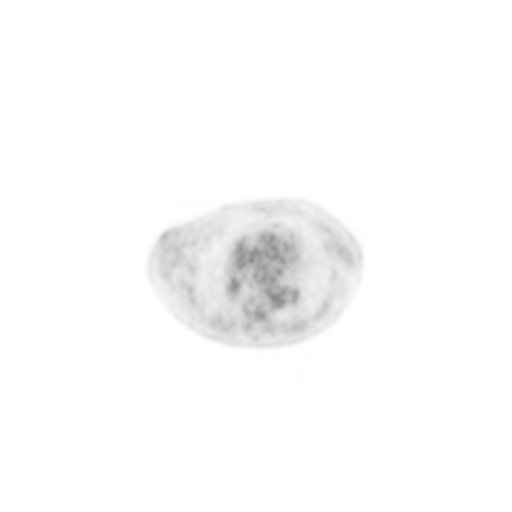
[im 193/289]
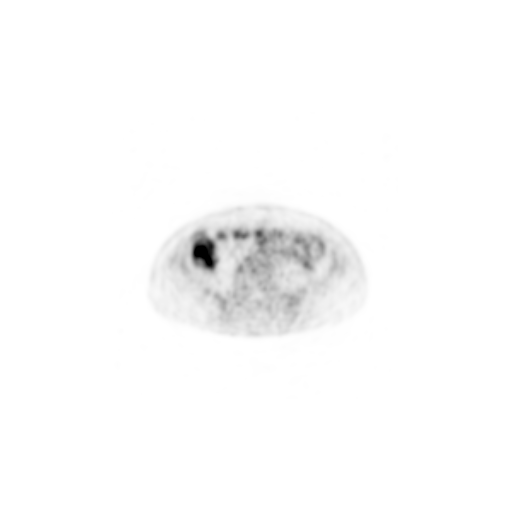
[im 289/289]
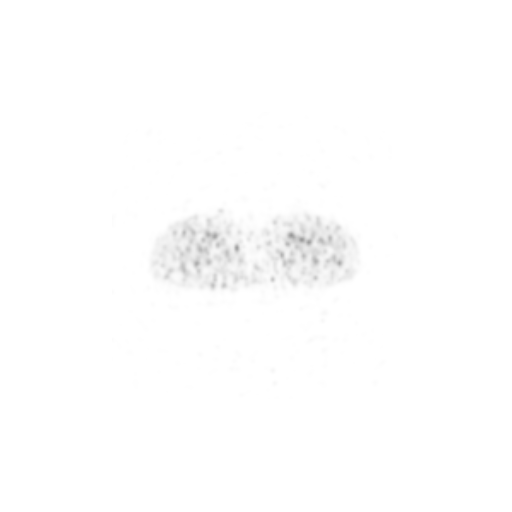

[Series 607: pet coronal · 1 of 100 slices shown]
[im 1/100]
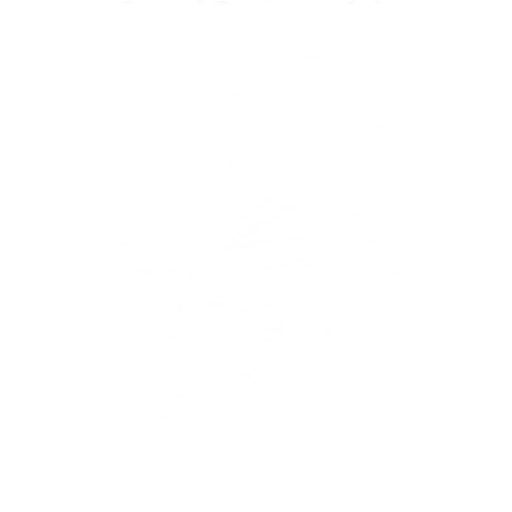

[Series 608: pet sagittal · 1 of 159 slices shown]
[im 159/159]
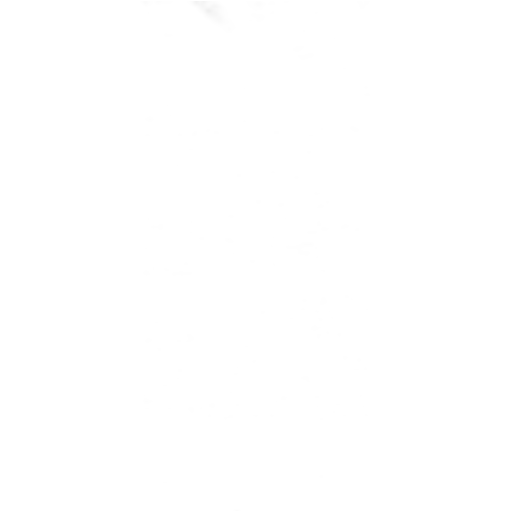

[Series 1104: results mm oncology reading · 1.0mm · 0.50mm/px · 1 of 4 slices shown]
[im 1/4]
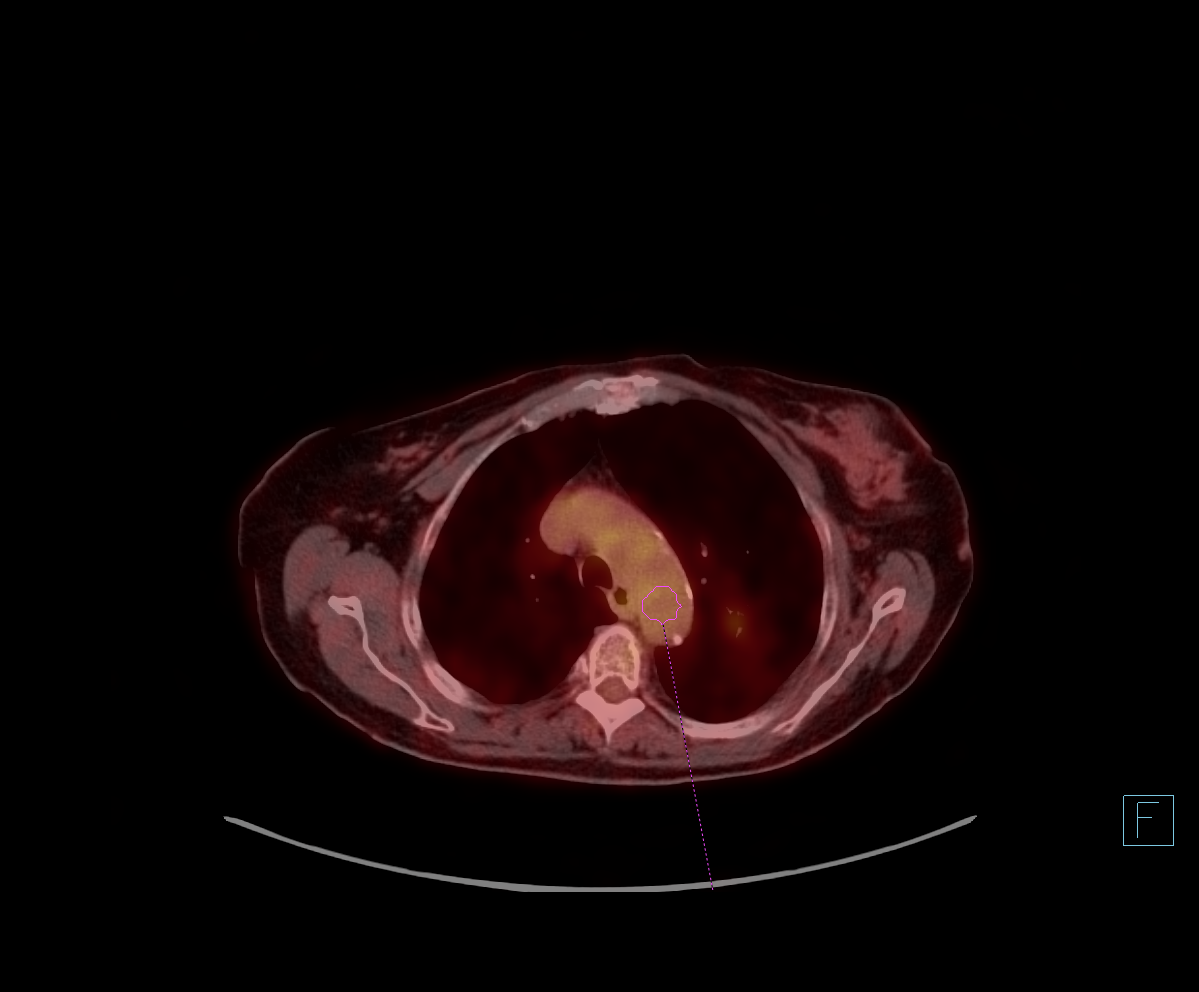

[20 of 25 positions shown; findings below may reference images not displayed]

FINDINGS: Mediastinal blood pool activity: SUV max

Liver activity: SUV max not applicable

NECK: No hypermetabolic lymph nodes in the neck.

Incidental CT findings: Streak artifact from dental hardware and
LEFT shoulder arthroplasty does mildly limit assessment of the neck.
Also limiting assessment of the upper chest with respect to LEFT
shoulder arthroplasty.

CHEST: Spiculated mass in the LEFT upper lobe with some surrounding
ground-glass and spiculation extending to the pleural surface in the
LEFT upper lobe (image 69/3) measures approximately 2.2 x 1.4 cm
which is unchanged compared to recent imaging. Maximum SUV of 10.9.
No signs of adenopathy in the chest. Areas of basilar atelectasis.
No effusion. Airways are patent.

Incidental CT findings: Calcified atheromatous plaque in the
thoracic aorta without aneurysm. Moderate cardiomegaly. Signs of
mitral annular calcification. No pericardial effusion. Esophagus
grossly normal.

Top-normal RIGHT axillary lymph node measuring 10 mm maximum SUV
less than mediastinal blood pool (image 67/3)

ABDOMEN/PELVIS: No abnormal hypermetabolic activity within the
liver, pancreas, adrenal glands, or spleen. No hypermetabolic lymph
nodes in the abdomen or pelvis.

Incidental CT findings: Post cholecystectomy and gastric bypass. No
acute findings related to liver, spleen, pancreas, adrenal glands,
kidneys, stomach, small or large bowel. Small cyst arises from the
posterior RIGHT kidney and potential angiomyolipoma measuring
approximately 7 mm in the interpolar LEFT kidney (image 139/3) signs
of colonic diverticulosis. Mild periumbilical stranding is noted
with low level FDG uptake extending inferiorly from the umbilicus.
Calcified atheromatous plaque of the abdominal aorta without
aneurysmal dilation.

SKELETON: No focal hypermetabolic activity to suggest skeletal
metastasis.

Incidental CT findings: LEFT shoulder arthroplasty. Spinal
degenerative changes.
IMPRESSION: 1. Hypermetabolic LEFT upper lobe nodule suspicious for bronchogenic
neoplasm. No signs of metastatic disease to the neck, chest, abdomen
or pelvis.
2. Mildly enlarged RIGHT axillary lymph node without metabolic
activity above mediastinal blood pool. This may be reactive. Would
also correlate with recent COVID vaccination if applicable. Consider
attention on follow-up.
3. Mild periumbilical fat stranding is of uncertain significance.
Correlate with any signs of cellulitis/panniculitis.
4. Post hysterectomy and cholecystectomy as well as gastric bypass.

## 2023-02-20 IMAGING — US US BIOPSY LYMPH NODE
1 series · 15 of 15 positions shown · non-contrast
Comparison: none

INDICATION: 80-year-old female with left upper lobe stage I bronchogenic
carcinoma. She has a prominent right axillary lymph node and
presents for ultrasound-guided core biopsy of the same.

[Series 1: us biopsy · 15 of 15 slices shown]
[im 1/15]
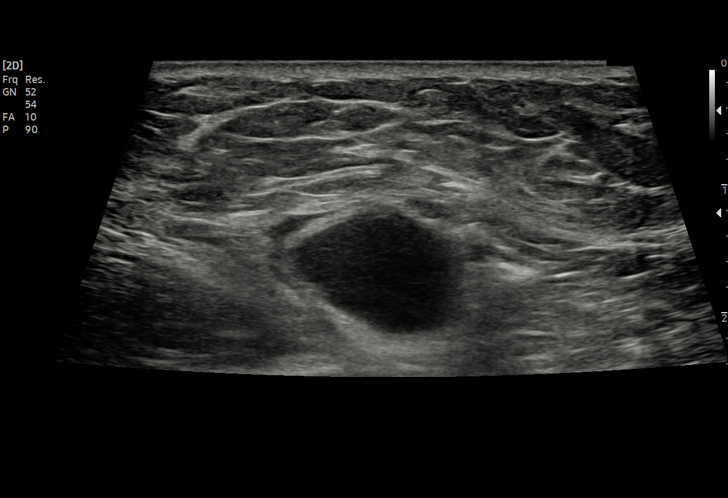
[im 2/15]
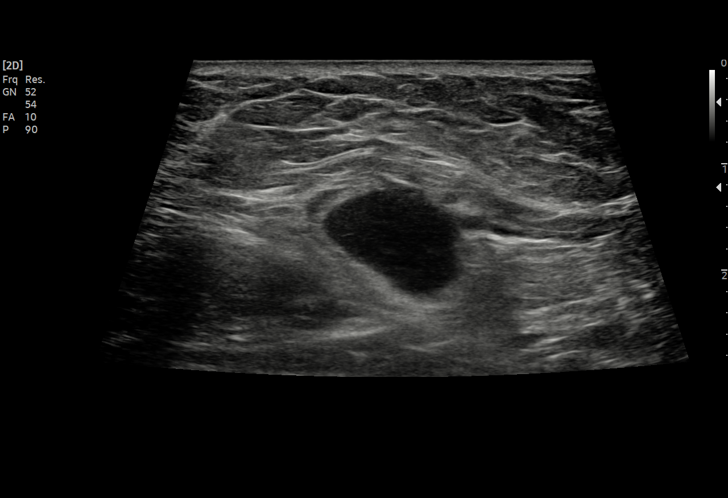
[im 3/15]
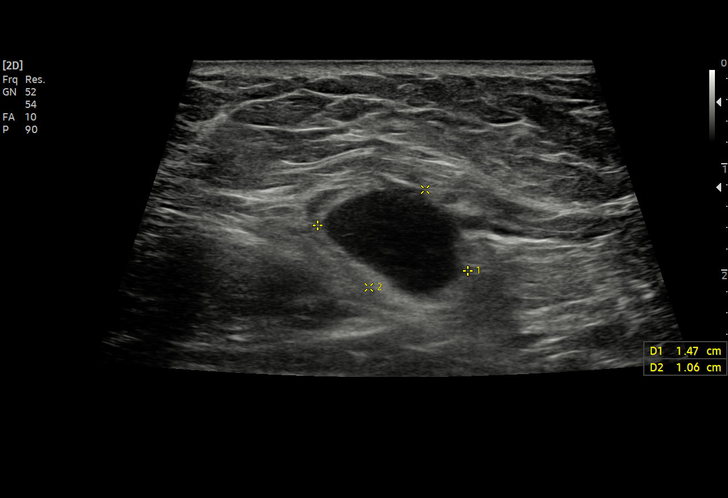
[im 4/15]
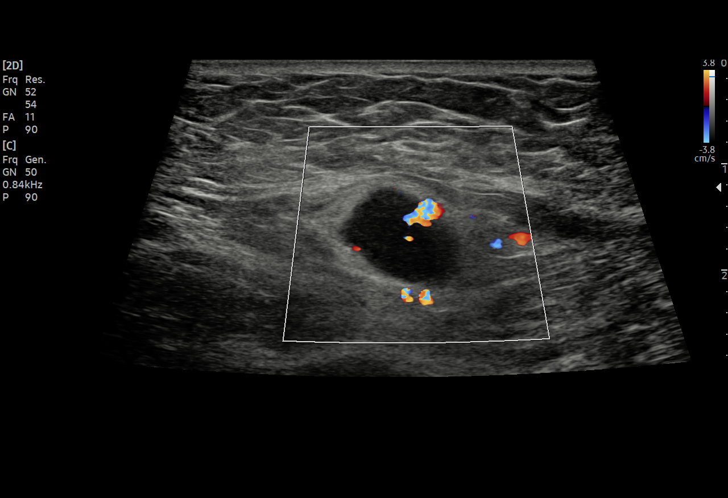
[im 5/15]
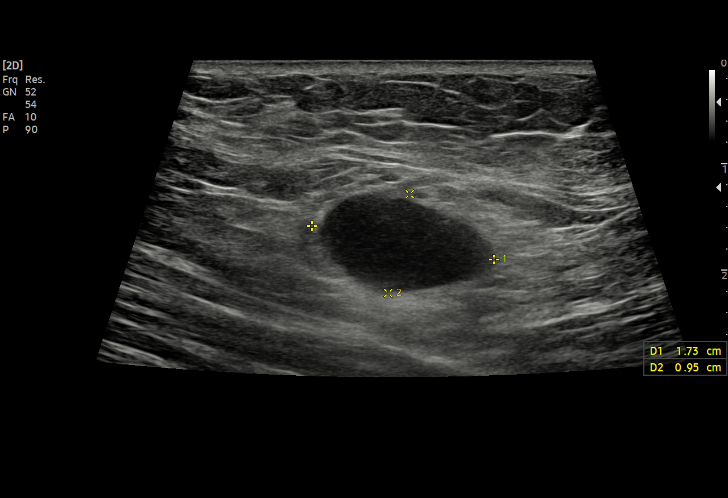
[im 6/15]
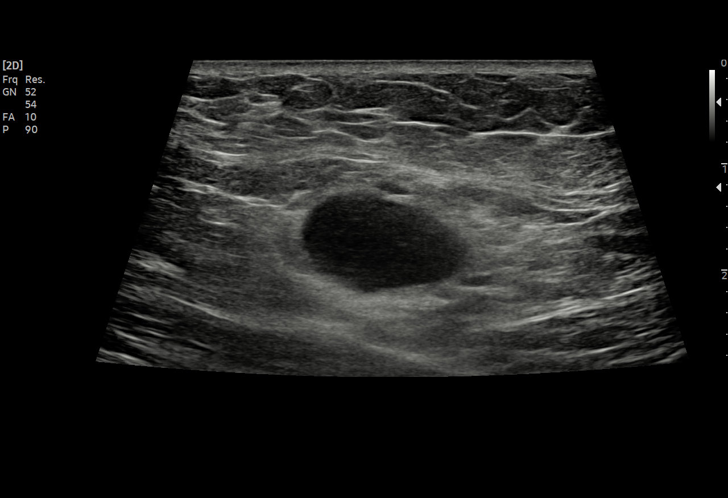
[im 7/15]
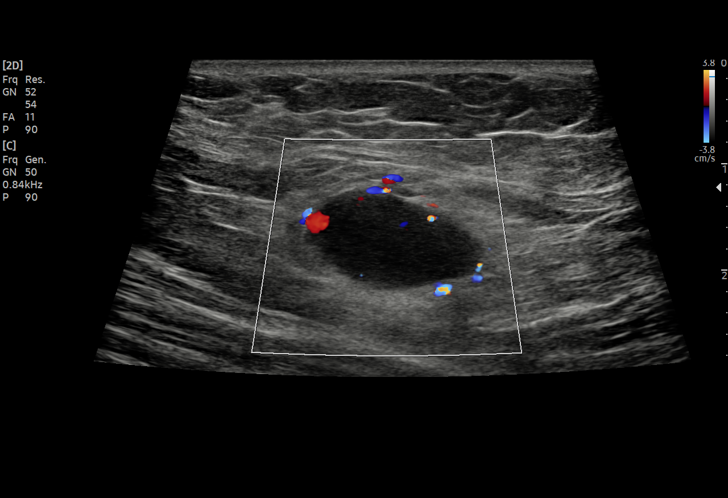
[im 8/15]
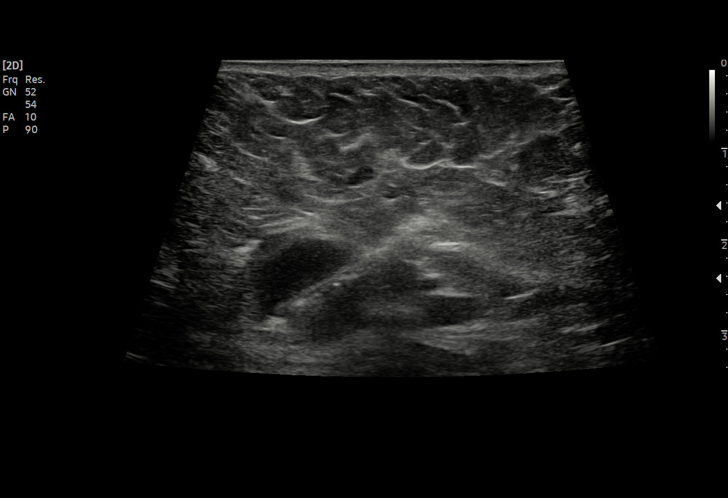
[im 9/15]
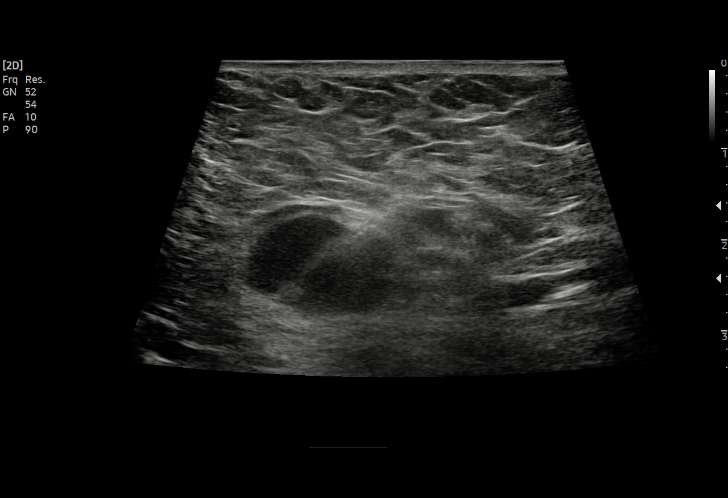
[im 10/15]
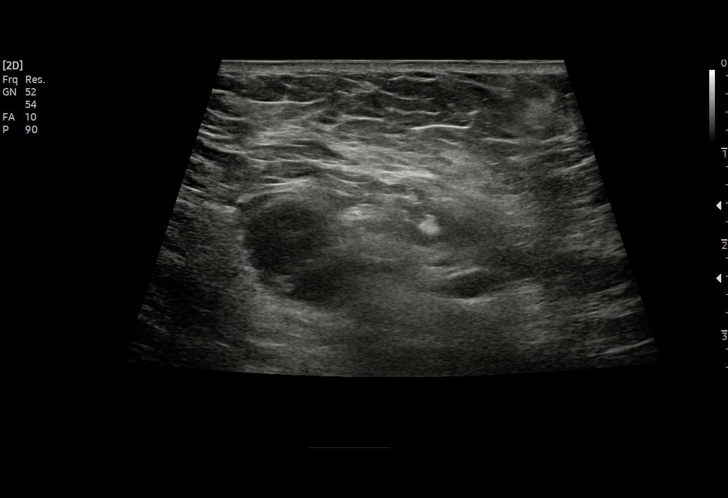
[im 11/15]
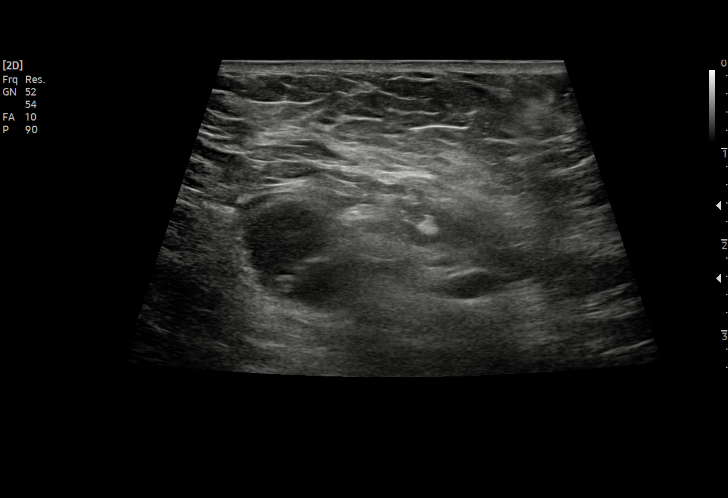
[im 12/15]
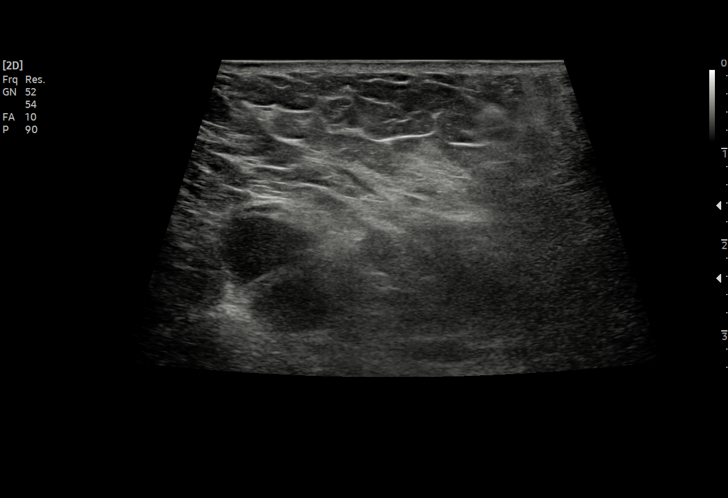
[im 13/15]
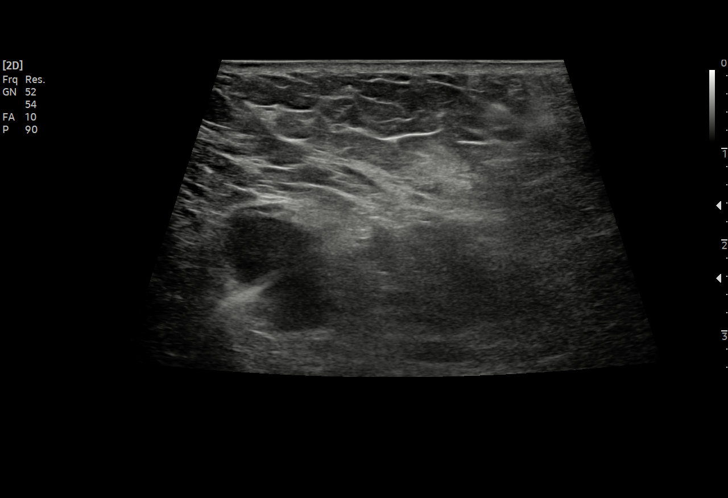
[im 14/15]
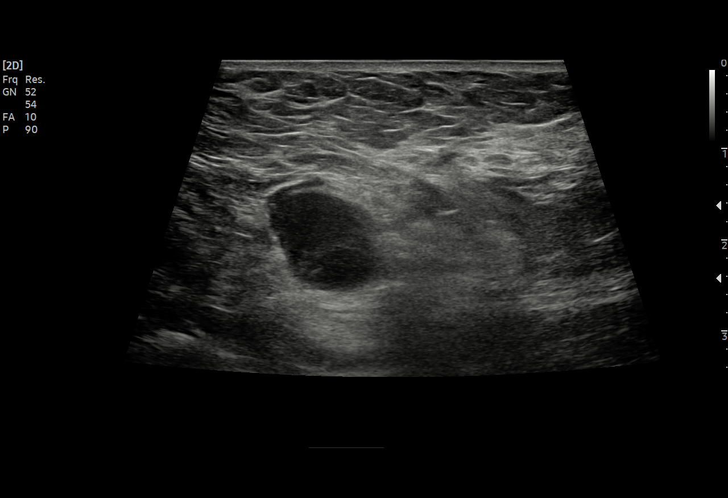
[im 15/15]
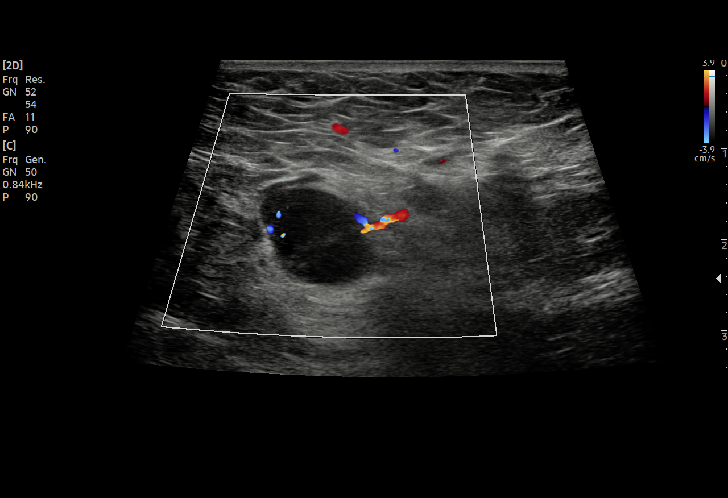

[15 of 15 positions shown; findings below may reference images not displayed]

EXAM:
ULTRASOUND OF THE LYMPH NODES

MEDICATIONS:
None.

ANESTHESIA/SEDATION:
Moderate (conscious) sedation was employed during this procedure. A
total of Versed 1 mg and Fentanyl 50 mcg was administered
intravenously.

Moderate Sedation Time: 13 minutes. The patient's level of
consciousness and vital signs were monitored continuously by
radiology nursing throughout the procedure under my direct
supervision.

FLUOROSCOPY TIME:  None.

COMPLICATIONS:
None immediate.

PROCEDURE:
Informed written consent was obtained from the patient after a
thorough discussion of the procedural risks, benefits and
alternatives. All questions were addressed. Maximal Sterile Barrier
Technique was utilized including caps, mask, sterile gowns, sterile
gloves, sterile drape, hand hygiene and skin antiseptic. A timeout
was performed prior to the initiation of the procedure.

Ultrasound was used to interrogate the axilla. A rounded hypoechoic
lymph node is present measuring 1.5 x 1.1 x 1.7 cm. The overlying
skin was sterilely prepped and draped in the standard fashion using
chlorhexidine skin prep. Local anesthesia was attained by
infiltration with 1% lidocaine. A small dermatotomy was made. Under
real time ultrasound guidance, multiple 18 gauge core biopsies were
obtained using the El[employer] Jonathan automated biopsy device. Biopsy
specimens were placed in formalin as the primary clinical concern is
metastatic lung cancer. Biopsy specimens were delivered to pathology
for further evaluation.

Post biopsy ultrasound imaging demonstrates no evidence of active
complication.
IMPRESSION: Ultrasound-guided core biopsy of right axillary lymph node.
